# Patient Record
Sex: Female | Born: 1975
Health system: Southern US, Community
[De-identification: ages and names within clinical notes are randomized; demographics above are authoritative.]

## PROBLEM LIST (undated history)

## (undated) DIAGNOSIS — I10 Essential (primary) hypertension: Secondary | ICD-10-CM

## (undated) DIAGNOSIS — E669 Obesity, unspecified: Secondary | ICD-10-CM

## (undated) DIAGNOSIS — E785 Hyperlipidemia, unspecified: Secondary | ICD-10-CM

## (undated) DIAGNOSIS — E119 Type 2 diabetes mellitus without complications: Secondary | ICD-10-CM

## (undated) DIAGNOSIS — F419 Anxiety disorder, unspecified: Secondary | ICD-10-CM

## (undated) DIAGNOSIS — F32A Depression, unspecified: Secondary | ICD-10-CM

## (undated) DIAGNOSIS — M545 Low back pain, unspecified: Secondary | ICD-10-CM

## (undated) DIAGNOSIS — F329 Major depressive disorder, single episode, unspecified: Secondary | ICD-10-CM

## (undated) HISTORY — DX: Obesity, unspecified: E66.9

## (undated) HISTORY — DX: Anxiety disorder, unspecified: F41.9

## (undated) HISTORY — DX: Low back pain, unspecified: M54.50

## (undated) HISTORY — PX: BLADDER SUSPENSION: SHX72

## (undated) HISTORY — DX: Depression, unspecified: F32.A

## (undated) HISTORY — DX: Essential (primary) hypertension: I10

## (undated) HISTORY — PX: SPINE SURGERY: SHX786

## (undated) HISTORY — DX: Hyperlipidemia, unspecified: E78.5

---
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
## 1998-08-11 HISTORY — PX: TONSILLECTOMY: SUR1361

## 2014-09-26 ENCOUNTER — Encounter: Payer: 59 | Attending: Family | Admitting: Dietician

## 2014-09-26 ENCOUNTER — Encounter: Payer: Self-pay | Admitting: Dietician

## 2014-09-26 VITALS — Ht 66.0 in | Wt 220.0 lb

## 2014-09-26 DIAGNOSIS — E780 Pure hypercholesterolemia: Secondary | ICD-10-CM | POA: Diagnosis not present

## 2014-09-26 DIAGNOSIS — E669 Obesity, unspecified: Secondary | ICD-10-CM

## 2014-09-26 DIAGNOSIS — Z713 Dietary counseling and surveillance: Secondary | ICD-10-CM | POA: Diagnosis present

## 2014-09-26 NOTE — Progress Notes (Signed)
  Medical Nutrition Therapy:  Appt start time: 2023 end time:  3435.   Assessment:  Primary concerns today: Patient wishes to lose weight and high cholesterol noted.  Patient wishes to improve overall health. States that she eats more with boredom, sleepiness, or stress and is unable to stop snacking on candy once starts.  She has joined [employer] and enjoys this program for the support that it provides.  Lowest weight was 145 lbs 12 years ago prior to children.  Patient's weight today was 220 lbs decreased from 228 lbs 3 weeks ago.    Patient lives with husband and 3 children ages 72 yo, 67 yo and 58 mo old.  Husband is medically retired from the [employer] and snacks frequently which temps patient.  Husband is now doing the cooking most of the time.  Both shop.  Eat together at the table usually.    January Labs reviewed:  Lipids elevated  Cholesterol 220  HDL:  44  LDL:  134 [place] results:  45.5% fat BMI 35.6 but patient is large  Boned and quite muscular.  Preferred Learning Style:   Auditory  Visual  Hands on  No preference indicated   Learning Readiness:   Ready  Change in progress   MEDICATIONS: see list   DIETARY INTAKE: Fast food intake has improved since moving here from [place] in October.  There is not significant fast food by her house. 24-hr recall:  B ( AM): scrambled eggs with spinach and tomatoes or bagel thin with peanut butter.    Snk ( AM):  L ( PM): rotisserie chicken and veges or subway or fast food but tries to avoid Snk ( PM):  candy or chips, cheese and crackers D ( PM): crock pot meals, meat, starch, vege or pasta or burgers or tacos Snk ( PM):  Beverages: Coffee with FF vanilla creamer, water, diet coke occasionally  Usual physical activity: none currently.  Problems with lower back pain.  Enjoys zumba and yoga but currently no time.  Estimated energy needs: 1800 calories 200 g carbohydrates 135 g protein 50 g fat  Progress Towards  Goal(s):  In progress.   Nutritional Diagnosis:  NB-1.1 Food and nutrition-related knowledge deficit As related to weight loss and forming healthier habits.  As evidenced by patient report.    Intervention:  Nutrition Counseling.  Began discussing healthy snacks and basic meal planing ideas.  Discussed importance of lifelong healthy eating habits.  Think about joining the [facility] or try gym at [employer] or classes offered through the hospital..  Walk as able.   Continue with [employer].  Support is great! Meal plan for grocery shopping and meals. Think about healthy snacks for work. Be mindful of portions. Be mindful of hunger cues.  Teaching Method Utilized:  Visual Auditory  Handouts given during visit include:  My plate  Snack list  Meal card  Barriers to learning/adherence to lifestyle change: busy schedule with young children and job  Demonstrated degree of understanding via:  Teach Back   Monitoring/Evaluation:  Dietary intake, exercise,  and body weight in 1 month(s).

## 2014-09-26 NOTE — Patient Instructions (Signed)
Think about joining the [facility] or try gym at [facility] or classes offered through the hospital..  Walk as able.   Continue with [employer].  Support is great! Meal plan for grocery shopping and meals. Think about healthy snacks for work. Be mindful of portions. Be mindful of hunger cues.

## 2014-10-31 ENCOUNTER — Ambulatory Visit: Payer: 59 | Admitting: Dietician

## 2015-08-15 ENCOUNTER — Other Ambulatory Visit: Payer: Self-pay | Admitting: Obstetrics & Gynecology

## 2015-08-15 ENCOUNTER — Other Ambulatory Visit (HOSPITAL_COMMUNITY)
Admission: RE | Admit: 2015-08-15 | Discharge: 2015-08-15 | Disposition: A | Discharge status: Home / Self Care | Payer: 59 | Source: Ambulatory Visit | Attending: Obstetrics & Gynecology | Admitting: Obstetrics & Gynecology

## 2015-08-15 DIAGNOSIS — Z01419 Encounter for gynecological examination (general) (routine) without abnormal findings: Secondary | ICD-10-CM | POA: Insufficient documentation

## 2015-08-15 DIAGNOSIS — R635 Abnormal weight gain: Secondary | ICD-10-CM | POA: Diagnosis not present

## 2015-08-15 DIAGNOSIS — Z1151 Encounter for screening for human papillomavirus (HPV): Secondary | ICD-10-CM | POA: Insufficient documentation

## 2015-08-15 DIAGNOSIS — F3281 Premenstrual dysphoric disorder: Secondary | ICD-10-CM | POA: Diagnosis not present

## 2015-08-15 DIAGNOSIS — Z30432 Encounter for removal of intrauterine contraceptive device: Secondary | ICD-10-CM | POA: Diagnosis not present

## 2015-08-15 DIAGNOSIS — Z309 Encounter for contraceptive management, unspecified: Secondary | ICD-10-CM | POA: Diagnosis not present

## 2015-08-15 DIAGNOSIS — Z01411 Encounter for gynecological examination (general) (routine) with abnormal findings: Secondary | ICD-10-CM | POA: Diagnosis not present

## 2015-08-15 MED FILL — NUVARING VAGINAL RING: 0.12-0.015 | 84 days supply | Qty: 3 | Fill #0

## 2015-08-20 LAB — CYTOLOGY - PAP

## 2015-10-01 ENCOUNTER — Telehealth: Payer: 59 | Admitting: Family

## 2015-10-01 DIAGNOSIS — R05 Cough: Secondary | ICD-10-CM | POA: Diagnosis not present

## 2015-10-01 DIAGNOSIS — J069 Acute upper respiratory infection, unspecified: Secondary | ICD-10-CM

## 2015-10-01 DIAGNOSIS — R059 Cough, unspecified: Secondary | ICD-10-CM

## 2015-10-01 NOTE — Progress Notes (Signed)
We are sorry that you are not feeling well.  Here is how we plan to help!  Based on what you have shared with me it looks like you have upper respiratory tract inflammation that has resulted in a significant cough.  Inflammation and infection in the upper respiratory tract is commonly called bronchitis and has four common causes:  Allergies, Viral Infections, Acid Reflux and Bacterial Infections.  Allergies, viruses and acid reflux are treated by controlling symptoms or eliminating the cause. An example might be a cough caused by taking certain blood pressure medications. You stop the cough by changing the medication. Another example might be a cough caused by acid reflux. Controlling the reflux helps control the cough.  Based on your presentation I believe you most likely have A cough due to allergies.  I recommend that you start the an over-the counter-allergy medication such as Claritin 10 mg or Zyrtec 10 mg daily.    In addition you may use A non-prescription cough medication called Mucinex DM: take 2 tablets every 12 hours.    HOME CARE . Only take medications as instructed by your medical team. . Complete the entire course of an antibiotic. . Drink plenty of fluids and get plenty of rest. . Avoid close contacts especially the very young and the elderly . Cover your mouth if you cough or cough into your sleeve. . Always remember to wash your hands . A steam or ultrasonic humidifier can help congestion.    GET HELP RIGHT AWAY IF: . You develop worsening fever. . You become short of breath . You cough up blood. . Your symptoms persist after you have completed your treatment plan MAKE SURE YOU   Understand these instructions.  Will watch your condition.  Will get help right away if you are not doing well or get worse.  Your e-visit answers were reviewed by a board certified advanced clinical practitioner to complete your personal care plan.  Depending on the condition, your plan  could have included both over the counter or prescription medications. If there is a problem please reply  once you have received a response from your provider. Your safety is important to [place].  If you have drug allergies check your prescription carefully.    You can use MyChart to ask questions about today's visit, request a non-urgent call back, or ask for a work or school excuse for 24 hours related to this e-Visit. If it has been greater than 24 hours you will need to follow up with your provider, or enter a new e-Visit to address those concerns. You will get an e-mail in the next two days asking about your experience.  I hope that your e-visit has been valuable and will speed your recovery. Thank you for using e-visits.

## 2015-10-02 ENCOUNTER — Telehealth: Payer: 59 | Admitting: Family

## 2015-10-02 DIAGNOSIS — R059 Cough, unspecified: Secondary | ICD-10-CM

## 2015-10-02 DIAGNOSIS — R05 Cough: Secondary | ICD-10-CM

## 2015-10-02 DIAGNOSIS — J069 Acute upper respiratory infection, unspecified: Secondary | ICD-10-CM | POA: Diagnosis not present

## 2015-10-02 NOTE — Progress Notes (Signed)
Based on what you shared with me it looks like you have a serious condition that should be evaluated in a face to face office visit.  If you are having a true medical emergency please call 911.  If you need an urgent face to face visit, [facility] has four urgent care centers for your convenience.  . [facility]  [phone] Get Driving Directions Find a Provider at this Location  [address] [place], [place] [address] . 8 am to 8 pm Monday-Friday . 9 am to 7 pm Saturday-Sunday  .  Urgent [facility]  [phone] Get Driving Directions Find a Provider at this Location  [address], [address] [place], [place] [address] . 8 am to 8 pm Monday-Friday . 9 am to 6 pm Saturday . 11 am to 6 pm Sunday   .  [facility] [place]  [phone] Get Driving Directions  [address] [place], [place] [address] . 8 am to 8 pm Monday-Friday . 9 am to 4 pm Saturday-Sunday   . [facility] (a walk in primary care provider)  [phone]  Get Driving Directions Find a Provider at this Location  [address] [place], [place] [address] . 8 am to 8:30 pm Monday-Thursday . 8 am to 6 pm Friday . 8 am to 4 pm Saturday-Sunday   Your e-visit answers were reviewed by a board certified advanced clinical practitioner to complete your personal care plan.  Thank you for using e-Visits. 

## 2015-11-05 ENCOUNTER — Telehealth: Payer: 59 | Admitting: Nurse Practitioner

## 2015-11-05 DIAGNOSIS — M546 Pain in thoracic spine: Secondary | ICD-10-CM | POA: Diagnosis not present

## 2015-11-05 MED ORDER — TIZANIDINE HCL 4 MG PO TABS: 4.0000 mg | ORAL_TABLET | Freq: Four times a day (QID) | ORAL | Status: DC | PRN

## 2015-11-05 MED ORDER — NAPROXEN 500 MG PO TABS: 500.0000 mg | ORAL_TABLET | Freq: Two times a day (BID) | ORAL | Status: DC

## 2015-11-05 MED FILL — tiZANidine HCL 4 MG TABS: 4 | 8 days supply | Qty: 30 | Fill #0

## 2015-11-05 MED FILL — NAPROXEN 500 MG TABLET: 500 | 30 days supply | Qty: 60 | Fill #0

## 2015-11-05 NOTE — Progress Notes (Signed)

## 2015-11-20 DIAGNOSIS — Z131 Encounter for screening for diabetes mellitus: Secondary | ICD-10-CM | POA: Diagnosis not present

## 2015-11-20 DIAGNOSIS — Z0000 Encounter for general adult medical examination without abnormal findings: Secondary | ICD-10-CM | POA: Diagnosis not present

## 2015-11-20 DIAGNOSIS — E669 Obesity, unspecified: Secondary | ICD-10-CM | POA: Diagnosis not present

## 2015-11-20 DIAGNOSIS — E785 Hyperlipidemia, unspecified: Secondary | ICD-10-CM | POA: Diagnosis not present

## 2015-11-20 MED FILL — PHENTERMINE 37.5 MG TABLET: 37.5 | 30 days supply | Qty: 30 | Fill #0

## 2015-12-07 MED FILL — NUVARING VAGINAL RING: 0.12-0.015 | 84 days supply | Qty: 3 | Fill #1

## 2015-12-18 MED FILL — PHENTERMINE 37.5 MG TABLET: 37.5 | 30 days supply | Qty: 30 | Fill #1

## 2016-01-16 MED FILL — PHENTERMINE 37.5 MG TABLET: 37.5 | 30 days supply | Qty: 30 | Fill #2

## 2016-02-14 MED FILL — NUVARING VAGINAL RING: 0.12-0.015 | 84 days supply | Qty: 3 | Fill #2

## 2016-04-03 DIAGNOSIS — M549 Dorsalgia, unspecified: Secondary | ICD-10-CM | POA: Diagnosis not present

## 2016-04-03 MED FILL — traMADol HCL 50 MG TABS: 50 | 8 days supply | Qty: 30 | Fill #0

## 2016-04-30 DIAGNOSIS — M542 Cervicalgia: Secondary | ICD-10-CM | POA: Diagnosis not present

## 2016-05-06 DIAGNOSIS — M542 Cervicalgia: Secondary | ICD-10-CM | POA: Diagnosis not present

## 2016-05-12 MED FILL — NUVARING VAGINAL RING: 0.12-0.015 | 84 days supply | Qty: 3 | Fill #3

## 2016-07-14 DIAGNOSIS — F418 Other specified anxiety disorders: Secondary | ICD-10-CM | POA: Diagnosis not present

## 2016-07-14 MED FILL — FLUoxetine HCL 20 MG CAPS: 20 | 30 days supply | Qty: 30 | Fill #0

## 2016-07-17 MED FILL — ALPRAZolam 0.5 MG TABS: 0.5 | 15 days supply | Qty: 30 | Fill #0

## 2016-07-21 ENCOUNTER — Encounter (HOSPITAL_COMMUNITY): Payer: Self-pay

## 2016-07-21 ENCOUNTER — Emergency Department (HOSPITAL_COMMUNITY): Payer: 59

## 2016-07-21 ENCOUNTER — Inpatient Hospital Stay (HOSPITAL_COMMUNITY)
Admission: EM | Admit: 2016-07-21 | Discharge: 2016-07-25 | DRG: 392 | Disposition: A | Discharge status: Home / Self Care | Payer: 59 | Attending: Family Medicine | Admitting: Family Medicine

## 2016-07-21 DIAGNOSIS — E876 Hypokalemia: Secondary | ICD-10-CM | POA: Diagnosis not present

## 2016-07-21 DIAGNOSIS — E785 Hyperlipidemia, unspecified: Secondary | ICD-10-CM | POA: Diagnosis present

## 2016-07-21 DIAGNOSIS — K5732 Diverticulitis of large intestine without perforation or abscess without bleeding: Secondary | ICD-10-CM | POA: Diagnosis present

## 2016-07-21 DIAGNOSIS — K5720 Diverticulitis of large intestine with perforation and abscess without bleeding: Secondary | ICD-10-CM | POA: Diagnosis not present

## 2016-07-21 DIAGNOSIS — R1032 Left lower quadrant pain: Secondary | ICD-10-CM | POA: Diagnosis not present

## 2016-07-21 DIAGNOSIS — D72829 Elevated white blood cell count, unspecified: Secondary | ICD-10-CM | POA: Diagnosis not present

## 2016-07-21 DIAGNOSIS — M5136 Other intervertebral disc degeneration, lumbar region: Secondary | ICD-10-CM | POA: Diagnosis present

## 2016-07-21 DIAGNOSIS — Z6834 Body mass index (BMI) 34.0-34.9, adult: Secondary | ICD-10-CM | POA: Diagnosis not present

## 2016-07-21 DIAGNOSIS — E669 Obesity, unspecified: Secondary | ICD-10-CM | POA: Diagnosis present

## 2016-07-21 DIAGNOSIS — Z79899 Other long term (current) drug therapy: Secondary | ICD-10-CM | POA: Diagnosis not present

## 2016-07-21 DIAGNOSIS — Z8249 Family history of ischemic heart disease and other diseases of the circulatory system: Secondary | ICD-10-CM

## 2016-07-21 DIAGNOSIS — Z975 Presence of (intrauterine) contraceptive device: Secondary | ICD-10-CM

## 2016-07-21 DIAGNOSIS — R339 Retention of urine, unspecified: Secondary | ICD-10-CM | POA: Diagnosis not present

## 2016-07-21 DIAGNOSIS — R140 Abdominal distension (gaseous): Secondary | ICD-10-CM | POA: Diagnosis not present

## 2016-07-21 LAB — COMPREHENSIVE METABOLIC PANEL
ALK PHOS: 53 U/L (ref 38–126)
ALT: 14 U/L (ref 14–54)
ANION GAP: 9 (ref 5–15)
AST: 20 U/L (ref 15–41)
Albumin: 3.7 g/dL (ref 3.5–5.0)
BILIRUBIN TOTAL: 1.7 mg/dL — AB (ref 0.3–1.2)
BUN: 10 mg/dL (ref 6–20)
CO2: 20 mmol/L — ABNORMAL LOW (ref 22–32)
Calcium: 8.9 mg/dL (ref 8.9–10.3)
Chloride: 107 mmol/L (ref 101–111)
Creatinine, Ser: 0.83 mg/dL (ref 0.44–1.00)
GFR calc non Af Amer: >60 mL/min (ref >60)
Glucose, Bld: 94 mg/dL (ref 65–99)
Potassium: 3.5 mmol/L (ref 3.5–5.1)
Sodium: 136 mmol/L (ref 135–145)
TOTAL PROTEIN: 6.9 g/dL (ref 6.5–8.1)

## 2016-07-21 LAB — CBC
HCT: 38.8 % (ref 36.0–46.0)
HEMOGLOBIN: 13.7 g/dL (ref 12.0–15.0)
MCH: 31.4 pg (ref 26.0–34.0)
MCHC: 35.3 g/dL (ref 30.0–36.0)
MCV: 88.8 fL (ref 78.0–100.0)
Platelets: 162 10*3/uL (ref 150–400)
RBC: 4.37 MIL/uL (ref 3.87–5.11)
RDW: 12.3 % (ref 11.5–15.5)
WBC: 15.2 10*3/uL — AB (ref 4.0–10.5)

## 2016-07-21 LAB — URINALYSIS, ROUTINE W REFLEX MICROSCOPIC
BILIRUBIN URINE: NEGATIVE
GLUCOSE, UA: NEGATIVE mg/dL
KETONES UR: 15 mg/dL — AB
Nitrite: NEGATIVE
PROTEIN: NEGATIVE mg/dL
Specific Gravity, Urine: 1.025 (ref 1.005–1.030)
pH: 5.5 (ref 5.0–8.0)

## 2016-07-21 LAB — URINALYSIS, MICROSCOPIC (REFLEX)

## 2016-07-21 LAB — LIPASE, BLOOD: Lipase: 19 U/L (ref 11–51)

## 2016-07-21 LAB — PREGNANCY, URINE: Preg Test, Ur: NEGATIVE

## 2016-07-21 IMAGING — CT CT ABD-PELV W/ CM
2 of 4 series · 9 of 46 positions shown, 10 images · IV contrast (Iodine)
Comparison: None.

CLINICAL DATA: Lower abdominal pain radiating to lower back since
this morning. Associated urinary retention.

EXAM:
CT ABDOMEN AND PELVIS WITH CONTRAST
TECHNIQUE: Multidetector CT imaging of the abdomen and pelvis was performed
using the standard protocol following bolus administration of
intravenous contrast.
CONTRAST:  100mL [FS] IOPAMIDOL ([FS]) INJECTION 61%

[Series 201: routine, idose (2) · axial · 0.83mm/px · z∈[+340,+755]mm · 6 of 101 slices shown, 7 images]
[im 9/101  soft-tissue]
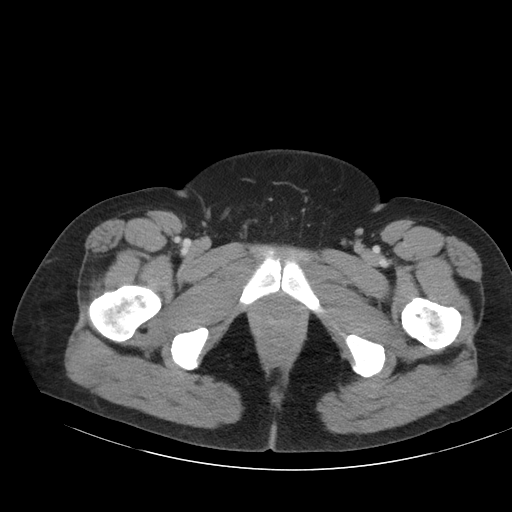
[im 9/101  bone]
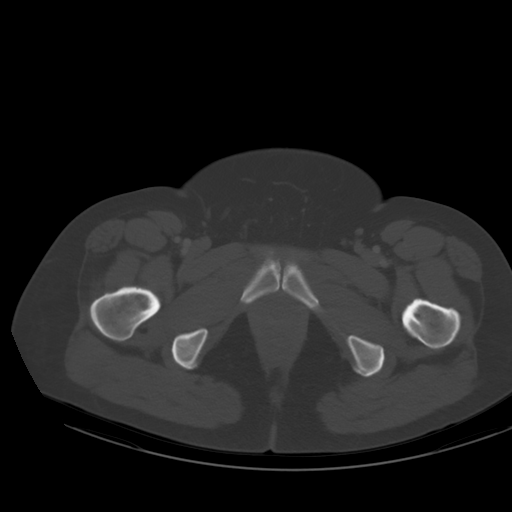
[im 27/101  soft-tissue]
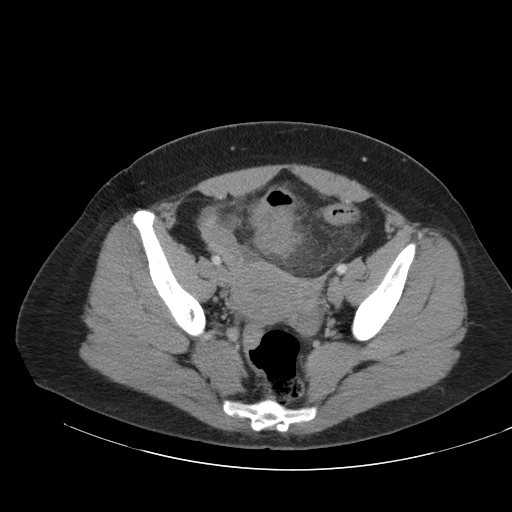
[im 44/101  soft-tissue]
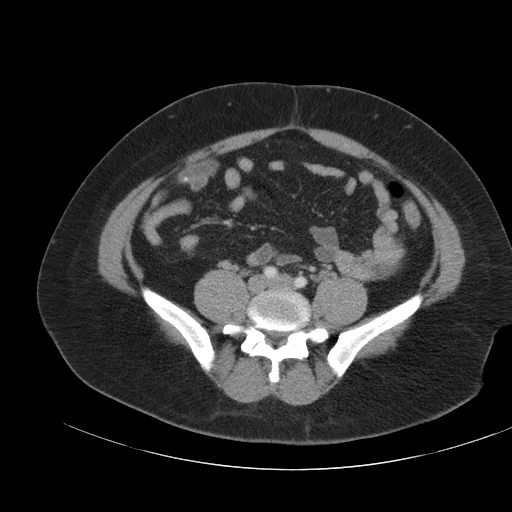
[im 57/101  soft-tissue]
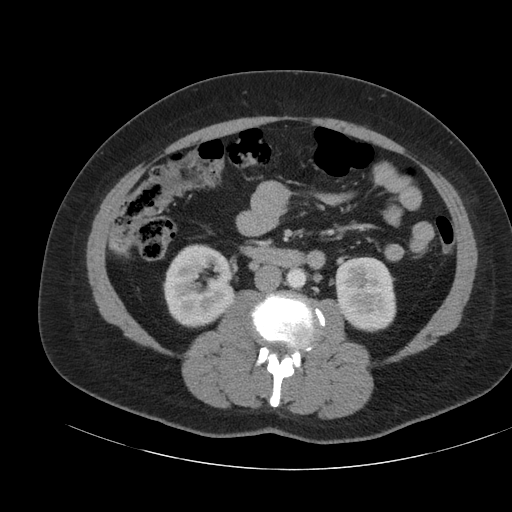
[im 74/101  soft-tissue]
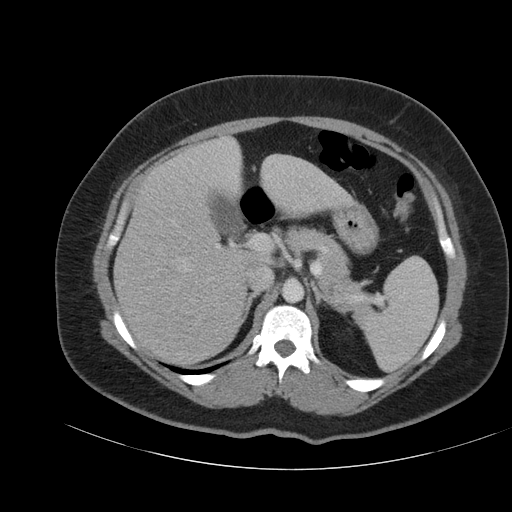
[im 92/101  soft-tissue]
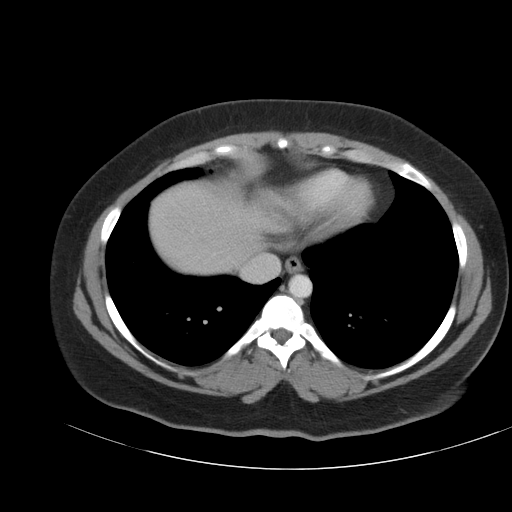

[Series 203: coronals, idose (2) · coronal · 0.45mm/px · 3 of 135 slices shown]
[im 45/135  soft-tissue]
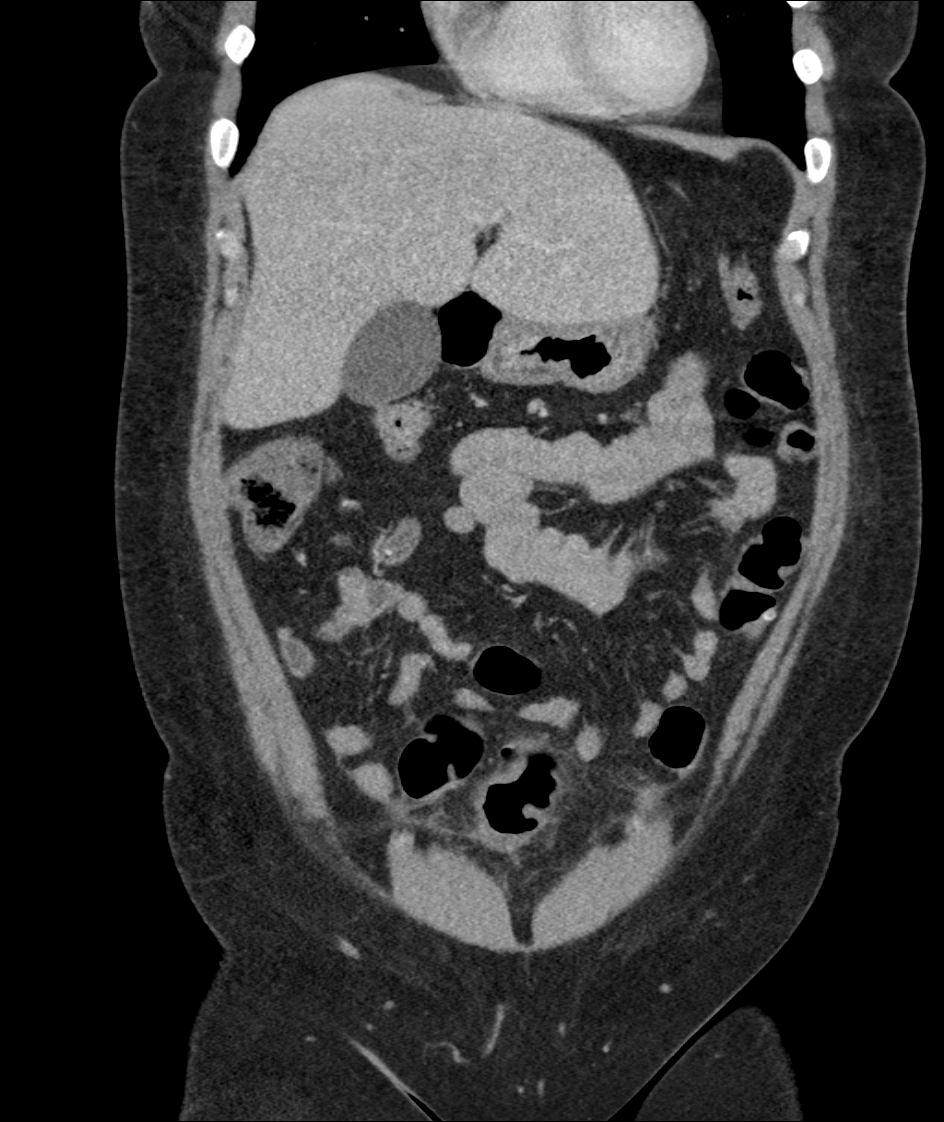
[im 60/135  soft-tissue]
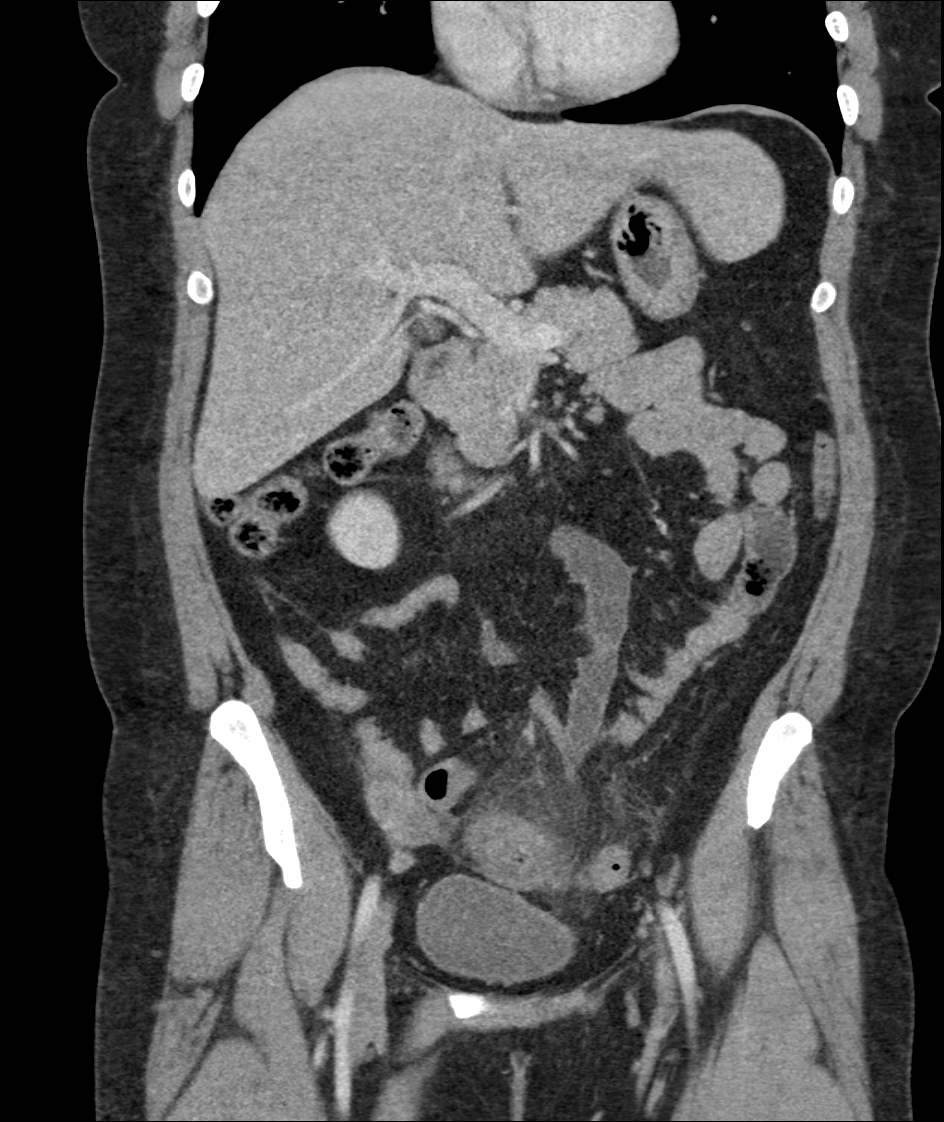
[im 75/135  soft-tissue]
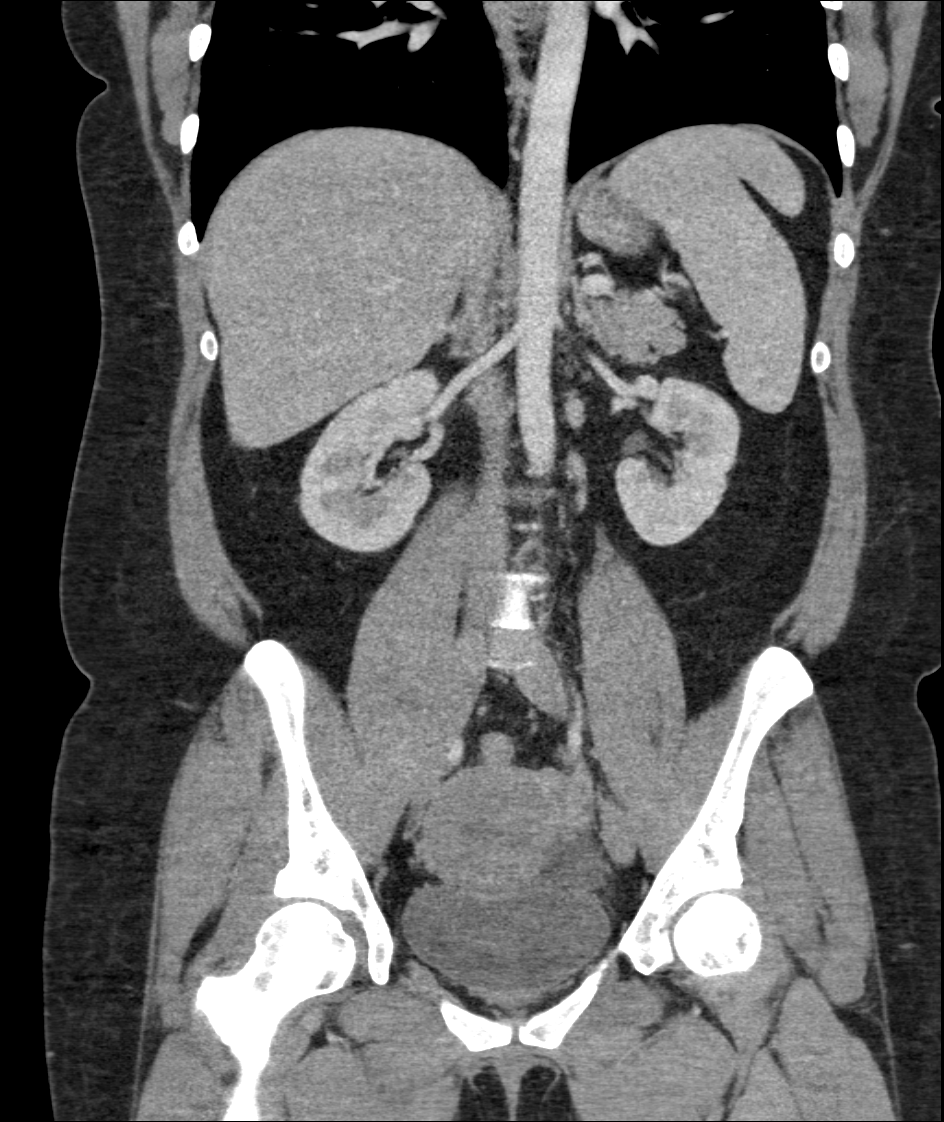

[9 of 46 positions shown; findings below may reference images not displayed]

FINDINGS: LOWER CHEST: Lung bases are clear. Included heart size is normal. No
pericardial effusion.

HEPATOBILIARY: Liver and gallbladder are normal.

PANCREAS: Normal.

SPLEEN: Normal.

ADRENALS/URINARY TRACT: Kidneys are orthotopic, demonstrating
symmetric enhancement. No nephrolithiasis, hydronephrosis or solid
renal masses. The unopacified ureters are normal in course and
caliber. Urinary bladder is partially distended and unremarkable.
Normal adrenal glands.

STOMACH/BOWEL: The stomach, small and large bowel are normal in
course. Moderate degree of pericolonic inflammation without abscess
is noted along the distal sigmoid colon consistent with
diverticulitis with microperforation given at least 4 dots of free
air noted, best seen on coronal image 62. Moderate degree of colonic
thickening from inflammation is seen. No bowel obstruction is noted.
Small amount of free fluid in the pelvis. Normal appendix.

VASCULAR/LYMPHATIC: Aortoiliac vessels are normal in course and
caliber. No lymphadenopathy by CT size criteria.

REPRODUCTIVE: Normal.  Diaphragm is noted in place for bold

OTHER: No intraperitoneal free fluid or free air.

MUSCULOSKELETAL: Nonacute. Limbus vertebra at L2. Degenerative disc
disease at L2-3.
IMPRESSION: Acute sigmoid diverticulitis with tiny foci of free intraperitoneal
air and moderate pericolonic inflammation. No bowel obstruction or
abscess. Moderate transmural thickening of the sigmoid in the
affected area. Small amount of free fluid is noted in the pelvis.

## 2016-07-21 MED ORDER — HYDROMORPHONE HCL 2 MG/ML IJ SOLN
0.5000 mg | Freq: Once | INTRAMUSCULAR | Status: AC
Start: 2016-07-21 — End: 2016-07-21
  Administered 2016-07-21: 0.5 mg via INTRAVENOUS
  Filled 2016-07-21: qty 1

## 2016-07-21 MED ORDER — OXYCODONE-ACETAMINOPHEN 5-325 MG PO TABS
ORAL_TABLET | ORAL | Status: AC
  Administered 2016-07-21: 1 via ORAL
  Filled 2016-07-21: qty 1

## 2016-07-21 MED ORDER — SODIUM CHLORIDE 0.9 % IV SOLN
INTRAVENOUS | Status: DC
  Administered 2016-07-21: 22:00:00 via INTRAVENOUS

## 2016-07-21 MED ORDER — ONDANSETRON HCL 4 MG/2ML IJ SOLN
4.0000 mg | Freq: Once | INTRAMUSCULAR | Status: AC
  Administered 2016-07-21: 4 mg via INTRAVENOUS
  Filled 2016-07-21: qty 2

## 2016-07-21 MED ORDER — IOPAMIDOL (ISOVUE-300) INJECTION 61%
INTRAVENOUS | Status: AC
  Administered 2016-07-21: 100 mL
  Filled 2016-07-21: qty 100

## 2016-07-21 MED ORDER — OXYCODONE-ACETAMINOPHEN 5-325 MG PO TABS
1.0000 | ORAL_TABLET | ORAL | Status: AC | PRN
  Administered 2016-07-21 (×2): 1 via ORAL

## 2016-07-21 NOTE — ED Triage Notes (Signed)
Per Pt, Pt is coming from home with complaints of lower abdominal pain that started this morning. Pt reports that she started to have cramping this morning with decreased urination. Starting at 1400, pt has been unable to urinate. About an hour ago, pt started to have lower back pain that has increased. Denies any N/V/D. Reports no relief with gasX or medication. Last bowel movement at 1600 with no new relief.

## 2016-07-21 NOTE — ED Provider Notes (Signed)
[facility] DEPT Provider Note   CSN: [id number] Arrival date & time: 07/21/16  1759 By signing my name below, I, Georgette Shell, attest that this documentation has been prepared under the direction and in the presence of [facility], [place]. Electronically Signed: Georgette Shell, ED Scribe. 07/21/16. 9:40 PM.  History   Chief Complaint Chief Complaint  Patient presents with  . Abdominal Pain  . Urinary Retention   HPI Comments: Crystal Madden is a 40 y.o. female with no pertinent PMHx, who presents to the Emergency Department complaining of gradually worsening, cramping, lower abdominal pain intermittently radiating to her lower back onset this morning. The pain is worse in the LLQ.  Pt reports she also has associated urinary retention that began around 2 pm. Her pain is exacerbated with bending forward. She has taken Gas X with no relief. Pt reports normal bowel movement. She denies h/o similar symptoms. Pt further denies fever, diarrhea, constipation, nausea, vomiting, vaginal discharge, or any other associated symptoms. She is married and has no concern for STI's.  The history is provided by the patient. No language interpreter was used.    Past Medical History:  Diagnosis Date  . Hyperlipidemia   . Obesity (BMI 30-39.9)     Patient Active Problem List   Diagnosis Date Noted  . Diverticulitis of sigmoid colon 07/22/2016    Past Surgical History:  Procedure Laterality Date  . BLADDER SUSPENSION    . CESAREAN SECTION      OB History    No data available       Home Medications    Prior to Admission medications   Medication Sig Start Date End Date Taking? Authorizing Provider  ALPRAZolam Duanne Moron) 0.5 MG tablet Take 0.25-0.5 mg by mouth 2 (two) times daily as needed. 07/17/16  Yes Historical Provider, MD  etonogestrel-ethinyl estradiol (NUVARING) 0.12-0.015 MG/24HR vaginal ring Place 1 each vaginally every 28 (twenty-eight) days. Insert vaginally and leave in place for 3  consecutive weeks, then remove for 1 week.   Yes Historical Provider, MD  FLUoxetine (PROZAC) 20 MG capsule Take 20 mg by mouth daily. 07/14/16  Yes Historical Provider, MD  ibuprofen (ADVIL,MOTRIN) 100 MG tablet Take 600 mg by mouth every 6 (six) hours as needed for fever.    Yes Historical Provider, MD  simethicone (MYLICON) 80 MG chewable tablet Chew 80 mg by mouth every 6 (six) hours as needed for flatulence.   Yes Historical Provider, MD  levonorgestrel (MIRENA) 20 MCG/24HR IUD 1 each by Intrauterine route once.    Historical Provider, MD  naproxen (NAPROSYN) 500 MG tablet Take 1 tablet (500 mg total) by mouth 2 (two) times daily with a meal. Patient not taking: Reported on 07/21/2016 11/05/15   Mary-Margaret Hassell Done, FNP  tiZANidine (ZANAFLEX) 4 MG tablet Take 1 tablet (4 mg total) by mouth every 6 (six) hours as needed for muscle spasms. Patient not taking: Reported on 07/21/2016 11/05/15   Mary-Margaret Hassell Done, FNP    Family History No family history on file.  Social History Social History  Substance Use Topics  . Smoking status: Never Smoker  . Smokeless tobacco: Never Used  . Alcohol use Yes     Allergies   Patient has no known allergies.   Review of Systems Review of Systems  Constitutional: Negative for chills and fever.  Gastrointestinal: Positive for abdominal pain. Negative for constipation, diarrhea, nausea and vomiting.  Genitourinary: Positive for decreased urine volume, difficulty urinating and flank pain. Negative for vaginal discharge.  Musculoskeletal:  Positive for back pain.  All other systems reviewed and are negative.    Physical Exam Updated Vital Signs BP 122/65 (BP Location: Left Arm)   Pulse 98   Temp 98.6 F (37 C) (Oral)   Resp 16   Ht 5\' 6"  (1.676 m)   Wt 97.5 kg   LMP 06/30/2016 (Approximate)   SpO2 98%   BMI 34.69 kg/m   Physical Exam  Constitutional: She appears well-developed and well-nourished.  HENT:  Head: Normocephalic.  Eyes:  Conjunctivae are normal.  Cardiovascular: Normal rate.   Pulmonary/Chest: Effort normal. No respiratory distress.  Abdominal: Soft. Bowel sounds are normal. There is tenderness in the left lower quadrant. There is guarding. There is no rebound and no CVA tenderness.  Lower abdominal tenderness that is increased in the LLQ.  Musculoskeletal: Normal range of motion.  Neurological: She is alert.  Skin: Skin is warm and dry.  Psychiatric: She has a normal mood and affect. Her behavior is normal.  Nursing note and vitals reviewed.    ED Treatments / Results  DIAGNOSTIC STUDIES: Oxygen Saturation is 100% on RA, normal by my interpretation.    COORDINATION OF CARE: 9:39 PM Discussed treatment plan with pt at bedside which includes abdomen CT and lab work and pt agreed to plan.  Labs (all labs ordered are listed, but only abnormal results are displayed) Labs Reviewed  URINALYSIS, ROUTINE W REFLEX MICROSCOPIC - Abnormal; Notable for the following:       Result Value   Hgb urine dipstick TRACE (*)    Ketones, ur 15 (*)    Leukocytes, UA TRACE (*)    All other components within normal limits  COMPREHENSIVE METABOLIC PANEL - Abnormal; Notable for the following:    CO2 20 (*)    Total Bilirubin 1.7 (*)    All other components within normal limits  CBC - Abnormal; Notable for the following:    WBC 15.2 (*)    All other components within normal limits  URINALYSIS, MICROSCOPIC (REFLEX) - Abnormal; Notable for the following:    Bacteria, UA FEW (*)    Squamous Epithelial / LPF 6-30 (*)    All other components within normal limits  LIPASE, BLOOD  PREGNANCY, URINE    Radiology Ct Abdomen Pelvis W Contrast  Result Date: 07/22/2016 CLINICAL DATA:  Lower abdominal pain radiating to lower back since this morning. Associated urinary retention. EXAM: CT ABDOMEN AND PELVIS WITH CONTRAST TECHNIQUE: Multidetector CT imaging of the abdomen and pelvis was performed using the standard protocol  following bolus administration of intravenous contrast. CONTRAST:  158mL ISOVUE-300 IOPAMIDOL (ISOVUE-300) INJECTION 61% COMPARISON:  None. FINDINGS: LOWER CHEST: Lung bases are clear. Included heart size is normal. No pericardial effusion. HEPATOBILIARY: Liver and gallbladder are normal. PANCREAS: Normal. SPLEEN: Normal. ADRENALS/URINARY TRACT: Kidneys are orthotopic, demonstrating symmetric enhancement. No nephrolithiasis, hydronephrosis or solid renal masses. The unopacified ureters are normal in course and caliber. Urinary bladder is partially distended and unremarkable. Normal adrenal glands. STOMACH/BOWEL: The stomach, small and large bowel are normal in course. Moderate degree of pericolonic inflammation without abscess is noted along the distal sigmoid colon consistent with diverticulitis with microperforation given at least 4 dots of free air noted, best seen on coronal image 62. Moderate degree of colonic thickening from inflammation is seen. No bowel obstruction is noted. Small amount of free fluid in the pelvis. Normal appendix. VASCULAR/LYMPHATIC: Aortoiliac vessels are normal in course and caliber. No lymphadenopathy by CT size criteria. REPRODUCTIVE: Normal.  Diaphragm is  noted in place for bold OTHER: No intraperitoneal free fluid or free air. MUSCULOSKELETAL: Nonacute. Limbus vertebra at L2. Degenerative disc disease at L2-3. IMPRESSION: Acute sigmoid diverticulitis with tiny foci of free intraperitoneal air and moderate pericolonic inflammation. No bowel obstruction or abscess. Moderate transmural thickening of the sigmoid in the affected area. Small amount of free fluid is noted in the pelvis. Electronically Signed   By: Ashley Royalty M.D.   On: 07/22/2016 00:25    Procedures Procedures (including critical care time)  Medications Ordered in ED Medications  0.9 %  sodium chloride infusion ( Intravenous Stopped 07/22/16 0054)  0.9 %  sodium chloride infusion (not administered)  HYDROmorphone  (DILAUDID) injection 1 mg (not administered)  ondansetron (ZOFRAN) injection 4 mg (not administered)  piperacillin-tazobactam (ZOSYN) IVPB 3.375 g (not administered)  oxyCODONE-acetaminophen (PERCOCET/ROXICET) 5-325 MG per tablet 1 tablet (1 tablet Oral Given by Other 07/21/16 1900)  ondansetron (ZOFRAN) injection 4 mg (4 mg Intravenous Given 07/21/16 2251)  HYDROmorphone (DILAUDID) injection 0.5 mg (0.5 mg Intravenous Given 07/21/16 2252)  iopamidol (ISOVUE-300) 61 % injection (100 mLs  Contrast Given 07/21/16 2343)  piperacillin-tazobactam (ZOSYN) IVPB 3.375 g (0 g Intravenous Stopped 07/22/16 0149)  HYDROmorphone (DILAUDID) injection 1 mg (1 mg Intravenous Given 07/22/16 0102)     Initial Impression / Assessment and Plan / ED Course  I have reviewed the triage vital signs and the nursing notes.  Pertinent labs & imaging results that were available during my care of the patient were reviewed by me and Dr. Christy Gentles and considered in my medical decision making (see chart for details).  Clinical Course   Labs, CT scan, pain management.  Dr. Christy Gentles in to examine the patient and discuss CT results and plan of care which includes admission, antibiotics and surgical consult. Patient agrees with plan.   Consult with [facility] and they will consult on the patient.  Dr. Kae Heller in to see the patient. Consult with admitting MD and will admit to med surg unit  CRITICAL CARE Performed by: Latonda Larrivee Total critical care time: 30 minutes Critical care time was exclusive of separately billable procedures and treating other patients. Critical care was necessary to treat or prevent imminent or life-threatening deterioration. Critical care was time spent personally by me on the following activities: development of treatment plan with patient and/or surrogate as well as nursing, discussions with consultants, evaluation of patient's response to treatment, examination of patient, obtaining history from  patient or surrogate, ordering and performing treatments and interventions, ordering and review of laboratory studies, ordering and review of radiographic studies, pulse oximetry and re-evaluation of patient's condition.  Final Clinical Impressions(s) / ED Diagnoses   Final diagnoses:  Diverticulitis of sigmoid colon    New Prescriptions New Prescriptions   No medications on file   I personally performed the services described in this documentation, which was scribed in my presence. The recorded information has been reviewed and is accurate.     [place], NP 07/22/16 [address], NP 07/26/16 [id number]    Ripley Fraise, MD 07/26/16 [phone]

## 2016-07-22 ENCOUNTER — Encounter (HOSPITAL_COMMUNITY): Payer: Self-pay | Admitting: Internal Medicine

## 2016-07-22 DIAGNOSIS — R1032 Left lower quadrant pain: Secondary | ICD-10-CM

## 2016-07-22 DIAGNOSIS — D72829 Elevated white blood cell count, unspecified: Secondary | ICD-10-CM | POA: Diagnosis not present

## 2016-07-22 DIAGNOSIS — E669 Obesity, unspecified: Secondary | ICD-10-CM | POA: Diagnosis present

## 2016-07-22 DIAGNOSIS — E876 Hypokalemia: Secondary | ICD-10-CM

## 2016-07-22 DIAGNOSIS — E785 Hyperlipidemia, unspecified: Secondary | ICD-10-CM | POA: Diagnosis present

## 2016-07-22 DIAGNOSIS — Z975 Presence of (intrauterine) contraceptive device: Secondary | ICD-10-CM | POA: Diagnosis not present

## 2016-07-22 DIAGNOSIS — Z79899 Other long term (current) drug therapy: Secondary | ICD-10-CM | POA: Diagnosis not present

## 2016-07-22 DIAGNOSIS — Z8249 Family history of ischemic heart disease and other diseases of the circulatory system: Secondary | ICD-10-CM | POA: Diagnosis not present

## 2016-07-22 DIAGNOSIS — K5732 Diverticulitis of large intestine without perforation or abscess without bleeding: Secondary | ICD-10-CM | POA: Diagnosis present

## 2016-07-22 DIAGNOSIS — M5136 Other intervertebral disc degeneration, lumbar region: Secondary | ICD-10-CM | POA: Diagnosis present

## 2016-07-22 DIAGNOSIS — R339 Retention of urine, unspecified: Secondary | ICD-10-CM | POA: Diagnosis present

## 2016-07-22 DIAGNOSIS — Z6834 Body mass index (BMI) 34.0-34.9, adult: Secondary | ICD-10-CM | POA: Diagnosis not present

## 2016-07-22 DIAGNOSIS — K5720 Diverticulitis of large intestine with perforation and abscess without bleeding: Secondary | ICD-10-CM | POA: Diagnosis not present

## 2016-07-22 LAB — CBC WITH DIFFERENTIAL/PLATELET
BASOS ABS: 0 10*3/uL (ref 0.0–0.1)
BASOS PCT: 0 %
EOS ABS: 0.1 10*3/uL (ref 0.0–0.7)
Eosinophils Relative: 0 %
HCT: 36.2 % (ref 36.0–46.0)
HEMOGLOBIN: 12.3 g/dL (ref 12.0–15.0)
Lymphocytes Relative: 15 %
Lymphs Abs: 1.7 10*3/uL (ref 0.7–4.0)
MCH: 30.4 pg (ref 26.0–34.0)
MCHC: 34 g/dL (ref 30.0–36.0)
MCV: 89.6 fL (ref 78.0–100.0)
MONOS PCT: 6 %
Monocytes Absolute: 0.7 10*3/uL (ref 0.1–1.0)
Neutro Abs: 8.9 10*3/uL — ABNORMAL HIGH (ref 1.7–7.7)
Neutrophils Relative %: 79 %
Platelets: 146 10*3/uL — ABNORMAL LOW (ref 150–400)
RBC: 4.04 MIL/uL (ref 3.87–5.11)
RDW: 12.5 % (ref 11.5–15.5)
WBC: 11.3 10*3/uL — AB (ref 4.0–10.5)

## 2016-07-22 LAB — COMPREHENSIVE METABOLIC PANEL
ALK PHOS: 44 U/L (ref 38–126)
ALT: 11 U/L — AB (ref 14–54)
AST: 14 U/L — ABNORMAL LOW (ref 15–41)
Albumin: 3 g/dL — ABNORMAL LOW (ref 3.5–5.0)
Anion gap: 8 (ref 5–15)
BILIRUBIN TOTAL: 2 mg/dL — AB (ref 0.3–1.2)
BUN: 5 mg/dL — ABNORMAL LOW (ref 6–20)
CALCIUM: 8.2 mg/dL — AB (ref 8.9–10.3)
CO2: 21 mmol/L — AB (ref 22–32)
CREATININE: 0.79 mg/dL (ref 0.44–1.00)
Chloride: 108 mmol/L (ref 101–111)
Glucose, Bld: 120 mg/dL — ABNORMAL HIGH (ref 65–99)
Potassium: 3.4 mmol/L — ABNORMAL LOW (ref 3.5–5.1)
Sodium: 137 mmol/L (ref 135–145)
TOTAL PROTEIN: 5.7 g/dL — AB (ref 6.5–8.1)

## 2016-07-22 MED ORDER — PIPERACILLIN-TAZOBACTAM 3.375 G IVPB 30 MIN
3.3750 g | Freq: Four times a day (QID) | INTRAVENOUS | Status: DC
  Filled 2016-07-22 (×2): qty 50

## 2016-07-22 MED ORDER — ONDANSETRON HCL 4 MG/2ML IJ SOLN: 4.0000 mg | Freq: Three times a day (TID) | INTRAMUSCULAR | Status: DC | PRN

## 2016-07-22 MED ORDER — ACETAMINOPHEN 325 MG PO TABS: 650.0000 mg | ORAL_TABLET | Freq: Four times a day (QID) | ORAL | Status: DC | PRN

## 2016-07-22 MED ORDER — DEXTROSE-NACL 5-0.9 % IV SOLN
INTRAVENOUS | Status: DC
  Administered 2016-07-22: 03:00:00 via INTRAVENOUS

## 2016-07-22 MED ORDER — HYDROMORPHONE HCL 2 MG/ML IJ SOLN
1.0000 mg | INTRAMUSCULAR | Status: DC | PRN
  Administered 2016-07-22 (×3): 1 mg via INTRAVENOUS
  Filled 2016-07-22 (×3): qty 1

## 2016-07-22 MED ORDER — ACETAMINOPHEN 650 MG RE SUPP: 650.0000 mg | Freq: Four times a day (QID) | RECTAL | Status: DC | PRN

## 2016-07-22 MED ORDER — SODIUM CHLORIDE 0.9 % IV SOLN: INTRAVENOUS | Status: DC

## 2016-07-22 MED ORDER — ONDANSETRON HCL 4 MG PO TABS
4.0000 mg | ORAL_TABLET | Freq: Four times a day (QID) | ORAL | Status: DC | PRN
  Administered 2016-07-23: 4 mg via ORAL
  Filled 2016-07-22: qty 1

## 2016-07-22 MED ORDER — POTASSIUM CHLORIDE 2 MEQ/ML IV SOLN
INTRAVENOUS | Status: DC
  Administered 2016-07-22: 12:00:00 via INTRAVENOUS
  Filled 2016-07-22 (×3): qty 1000

## 2016-07-22 MED ORDER — CHLORHEXIDINE GLUCONATE 0.12 % MT SOLN
15.0000 mL | Freq: Two times a day (BID) | OROMUCOSAL | Status: DC
  Administered 2016-07-22 – 2016-07-23 (×3): 15 mL via OROMUCOSAL
  Filled 2016-07-22 (×3): qty 15

## 2016-07-22 MED ORDER — HYDROMORPHONE HCL 2 MG/ML IJ SOLN
2.0000 mg | INTRAMUSCULAR | Status: DC | PRN
  Administered 2016-07-22 – 2016-07-23 (×6): 2 mg via INTRAVENOUS
  Filled 2016-07-22 (×6): qty 1

## 2016-07-22 MED ORDER — ORAL CARE MOUTH RINSE
15.0000 mL | Freq: Two times a day (BID) | OROMUCOSAL | Status: DC
  Administered 2016-07-22 – 2016-07-23 (×2): 15 mL via OROMUCOSAL

## 2016-07-22 MED ORDER — ENOXAPARIN SODIUM 40 MG/0.4ML ~~LOC~~ SOLN
40.0000 mg | SUBCUTANEOUS | Status: DC
  Administered 2016-07-22 – 2016-07-23 (×2): 40 mg via SUBCUTANEOUS
  Filled 2016-07-22 (×3): qty 0.4

## 2016-07-22 MED ORDER — KCL IN DEXTROSE-NACL 40-5-0.9 MEQ/L-%-% IV SOLN
INTRAVENOUS | Status: DC
  Administered 2016-07-23 – 2016-07-24 (×2): via INTRAVENOUS
  Filled 2016-07-22 (×3): qty 1000

## 2016-07-22 MED ORDER — HYDROMORPHONE HCL 2 MG/ML IJ SOLN
1.0000 mg | Freq: Once | INTRAMUSCULAR | Status: AC
  Administered 2016-07-22: 1 mg via INTRAVENOUS
  Filled 2016-07-22: qty 1

## 2016-07-22 MED ORDER — PIPERACILLIN-TAZOBACTAM 3.375 G IVPB 30 MIN
3.3750 g | Freq: Once | INTRAVENOUS | Status: AC
  Administered 2016-07-22: 3.375 g via INTRAVENOUS
  Filled 2016-07-22: qty 50

## 2016-07-22 MED ORDER — POTASSIUM CHLORIDE 2 MEQ/ML IV SOLN
30.0000 meq | Freq: Once | INTRAVENOUS | Status: AC
  Administered 2016-07-22: 30 meq via INTRAVENOUS
  Filled 2016-07-22: qty 15

## 2016-07-22 MED ORDER — MORPHINE SULFATE (PF) 4 MG/ML IV SOLN
2.0000 mg | INTRAVENOUS | Status: DC | PRN
Start: 2016-07-22 — End: 2016-07-22
  Administered 2016-07-22: 2 mg via INTRAVENOUS
  Filled 2016-07-22: qty 1

## 2016-07-22 MED ORDER — HYDROMORPHONE HCL 2 MG/ML IJ SOLN: 1.0000 mg | INTRAMUSCULAR | Status: DC | PRN

## 2016-07-22 MED ORDER — PIPERACILLIN-TAZOBACTAM 3.375 G IVPB
3.3750 g | Freq: Three times a day (TID) | INTRAVENOUS | Status: DC
  Administered 2016-07-22 – 2016-07-24 (×7): 3.375 g via INTRAVENOUS
  Filled 2016-07-22 (×10): qty 50

## 2016-07-22 MED ORDER — ONDANSETRON HCL 4 MG/2ML IJ SOLN: 4.0000 mg | Freq: Four times a day (QID) | INTRAMUSCULAR | Status: DC | PRN

## 2016-07-22 NOTE — Progress Notes (Signed)
Subjective: Still pretty tender LLQ, no other complaints.  Objective: Vital signs in last 24 hours: Temp:  [98.6 F (37 C)] 98.6 F (37 C) (12/12 0549) Pulse Rate:  [90-108] 90 (12/12 0549) Resp:  [16-18] 16 (12/12 0549) BP: (122-141)/(63-75) 126/68 (12/12 0549) SpO2:  [95 %-100 %] 98 % (12/12 0549) Weight:  [97.1 kg (214 lb)-97.5 kg (214 lb 14.4 oz)] 97.1 kg (214 lb) (12/12 0220) Last BM Date: 07/21/16 IV 1050 only thing in I/O Labs stable, bilirubin is up, WBC is better CT scan 07/21/16:  Moderate degree of pericolonic inflammation without abscess is noted along the distal sigmoid colon consistent with diverticulitis with microperforation given at least 4 dots of free air noted, best seen on coronal image 62. Moderate degree of colonic thickening from inflammation is seen. No bowel obstruction is noted. Small amount of free fluid in the pelvis. Normal appendix. Intake/Output from previous day: 12/11 0701 - 12/12 0700 In: 1050 [I.V.:1000; IV Piggyback:50] Out: -  Intake/Output this shift: No intake/output data recorded.  General appearance: alert, cooperative and no distress ABdominal:  Soft, tender LLQ, no distension/peritonitis   Lab Results:   Recent Labs  07/21/16 1844 07/22/16 0542  WBC 15.2* 11.3*  HGB 13.7 12.3  HCT 38.8 36.2  PLT 162 146*    BMET  Recent Labs  07/21/16 1844 07/22/16 0542  NA 136 137  K 3.5 3.4*  CL 107 108  CO2 20* 21*  GLUCOSE 94 120*  BUN 10 5*  CREATININE 0.83 0.79  CALCIUM 8.9 8.2*   PT/INR No results for input(s): LABPROT, INR in the last 72 hours.   Recent Labs Lab 07/21/16 1844 07/22/16 0542  AST 20 14*  ALT 14 11*  ALKPHOS 53 44  BILITOT 1.7* 2.0*  PROT 6.9 5.7*  ALBUMIN 3.7 3.0*     Lipase     Component Value Date/Time   LIPASE 19 07/21/2016 1844     Studies/Results: Ct Abdomen Pelvis W Contrast  Result Date: 07/22/2016 CLINICAL DATA:  Lower abdominal pain radiating to lower back since this  morning. Associated urinary retention. EXAM: CT ABDOMEN AND PELVIS WITH CONTRAST TECHNIQUE: Multidetector CT imaging of the abdomen and pelvis was performed using the standard protocol following bolus administration of intravenous contrast. CONTRAST:  183mL ISOVUE-300 IOPAMIDOL (ISOVUE-300) INJECTION 61% COMPARISON:  None. FINDINGS: LOWER CHEST: Lung bases are clear. Included heart size is normal. No pericardial effusion. HEPATOBILIARY: Liver and gallbladder are normal. PANCREAS: Normal. SPLEEN: Normal. ADRENALS/URINARY TRACT: Kidneys are orthotopic, demonstrating symmetric enhancement. No nephrolithiasis, hydronephrosis or solid renal masses. The unopacified ureters are normal in course and caliber. Urinary bladder is partially distended and unremarkable. Normal adrenal glands. STOMACH/BOWEL: The stomach, small and large bowel are normal in course. Moderate degree of pericolonic inflammation without abscess is noted along the distal sigmoid colon consistent with diverticulitis with microperforation given at least 4 dots of free air noted, best seen on coronal image 62. Moderate degree of colonic thickening from inflammation is seen. No bowel obstruction is noted. Small amount of free fluid in the pelvis. Normal appendix. VASCULAR/LYMPHATIC: Aortoiliac vessels are normal in course and caliber. No lymphadenopathy by CT size criteria. REPRODUCTIVE: Normal.  Diaphragm is noted in place for bold OTHER: No intraperitoneal free fluid or free air. MUSCULOSKELETAL: Nonacute. Limbus vertebra at L2. Degenerative disc disease at L2-3. IMPRESSION: Acute sigmoid diverticulitis with tiny foci of free intraperitoneal air and moderate pericolonic inflammation. No bowel obstruction or abscess. Moderate transmural thickening of the sigmoid in the affected  area. Small amount of free fluid is noted in the pelvis. Electronically Signed   By: Ashley Royalty M.D.   On: 07/22/2016 00:25   Prior to Admission medications   Medication Sig  Start Date End Date Taking? Authorizing Provider  ALPRAZolam Duanne Moron) 0.5 MG tablet Take 0.25-0.5 mg by mouth 2 (two) times daily as needed. 07/17/16  Yes Historical Provider, MD  etonogestrel-ethinyl estradiol (NUVARING) 0.12-0.015 MG/24HR vaginal ring Place 1 each vaginally every 28 (twenty-eight) days. Insert vaginally and leave in place for 3 consecutive weeks, then remove for 1 week.   Yes Historical Provider, MD  FLUoxetine (PROZAC) 20 MG capsule Take 20 mg by mouth daily. 07/14/16  Yes Historical Provider, MD  ibuprofen (ADVIL,MOTRIN) 100 MG tablet Take 600 mg by mouth every 6 (six) hours as needed for fever.    Yes Historical Provider, MD  simethicone (MYLICON) 80 MG chewable tablet Chew 80 mg by mouth every 6 (six) hours as needed for flatulence.   Yes Historical Provider, MD  levonorgestrel (MIRENA) 20 MCG/24HR IUD 1 each by Intrauterine route once.    Historical Provider, MD  naproxen (NAPROSYN) 500 MG tablet Take 1 tablet (500 mg total) by mouth 2 (two) times daily with a meal. Patient not taking: Reported on 07/21/2016 11/05/15   Mary-Margaret Hassell Done, FNP  tiZANidine (ZANAFLEX) 4 MG tablet Take 1 tablet (4 mg total) by mouth every 6 (six) hours as needed for muscle spasms. Patient not taking: Reported on 07/21/2016 11/05/15   Mary-Margaret Hassell Done, FNP    Medications: . mouth rinse  15 mL Mouth Rinse BID  . piperacillin-tazobactam (ZOSYN)  IV  3.375 g Intravenous Q8H   . dextrose 5 % and 0.9% NaCl 100 mL/hr at 07/22/16 [id number]    Assessment/Plan Acute sigmoid diverticulitis with microperforation Degenerative disc disease L2-3 FEN;  IV fluids/NPO. ID: Zosyn day 2 DVT: SCD - adding Lovenox but will watch platelets  Plan:  NPO except ice chips for now.  Hope to advance diet as her sx improve.        LOS: 0 days    Abhiram Criado 07/22/2016 [phone]

## 2016-07-22 NOTE — ED Provider Notes (Signed)
Patient seen/examined in the Emergency Department in conjunction with [facility] Patient reports low abd pain that worsened earlier in the day Exam : awake/alert,LLQ tenderness noted Plan: admit Needs IV antibiotics Needs surgical consult     Ripley Fraise, MD 07/22/16 [phone]

## 2016-07-22 NOTE — H&P (Signed)
History and Physical    Crystal Madden [id number] DOB: Oct 06, 1975 DOA: 07/21/2016  PCP: Eloise Levels, NP  Patient coming from: Home.  Chief Complaint: Abdominal pain.  HPI: Crystal Madden is a 40 y.o. female with no significant past medical history presents to the ER with complaints of abdominal pain. Patient's symptoms started yesterday morning with abdominal pain which gradually worsened. Denies any fever or chills nausea vomiting or diarrhea. Pain is mostly in the left lower quadrant. Pain started radiating across the lower abdomen. CT of the abdomen and pelvis done shows sigmoid diverticulitis with microperforation. On-call surgeon was consulted and patient is being admitted for further observation and management.   ED Course: CT abdomen and pelvis done shows sigmoid diverticulitis with pericolonic inflammation and microperforation.  Review of Systems: As per HPI, rest all negative.   Past Medical History:  Diagnosis Date  . Hyperlipidemia   . Obesity (BMI 30-39.9)     Past Surgical History:  Procedure Laterality Date  . BLADDER SUSPENSION    . CESAREAN SECTION       reports that she has never smoked. She has never used smokeless tobacco. She reports that she drinks alcohol. She reports that she does not use drugs.  No Known Allergies  Family History  Problem Relation Age of Onset  . Hypertension Mother   . Colon cancer Neg Hx     Prior to Admission medications   Medication Sig Start Date End Date Taking? Authorizing Provider  ALPRAZolam Duanne Moron) 0.5 MG tablet Take 0.25-0.5 mg by mouth 2 (two) times daily as needed. 07/17/16  Yes Historical Provider, MD  etonogestrel-ethinyl estradiol (NUVARING) 0.12-0.015 MG/24HR vaginal ring Place 1 each vaginally every 28 (twenty-eight) days. Insert vaginally and leave in place for 3 consecutive weeks, then remove for 1 week.   Yes Historical Provider, MD  FLUoxetine (PROZAC) 20 MG capsule Take 20 mg by mouth daily.  07/14/16  Yes Historical Provider, MD  ibuprofen (ADVIL,MOTRIN) 100 MG tablet Take 600 mg by mouth every 6 (six) hours as needed for fever.    Yes Historical Provider, MD  simethicone (MYLICON) 80 MG chewable tablet Chew 80 mg by mouth every 6 (six) hours as needed for flatulence.   Yes Historical Provider, MD  levonorgestrel (MIRENA) 20 MCG/24HR IUD 1 each by Intrauterine route once.    Historical Provider, MD  naproxen (NAPROSYN) 500 MG tablet Take 1 tablet (500 mg total) by mouth 2 (two) times daily with a meal. Patient not taking: Reported on 07/21/2016 11/05/15   Mary-Margaret Hassell Done, FNP  tiZANidine (ZANAFLEX) 4 MG tablet Take 1 tablet (4 mg total) by mouth every 6 (six) hours as needed for muscle spasms. Patient not taking: Reported on 07/21/2016 11/05/15   Chevis Pretty, FNP    Physical Exam: Vitals:   07/21/16 1839 07/21/16 1840 07/21/16 2104 07/22/16 0117  BP: 133/63  131/63 122/65  Pulse: 107  108 98  Resp: 18  18 16   Temp: 98.6 F (37 C)     TempSrc: Oral     SpO2: 100%  95% 98%  Weight:  97.5 kg (214 lb 14.4 oz)    Height:  5\' 6"  (1.676 m)        Constitutional: Moderately built and nourished. Vitals:   07/21/16 1839 07/21/16 1840 07/21/16 2104 07/22/16 0117  BP: 133/63  131/63 122/65  Pulse: 107  108 98  Resp: 18  18 16   Temp: 98.6 F (37 C)     TempSrc: Oral  SpO2: 100%  95% 98%  Weight:  97.5 kg (214 lb 14.4 oz)    Height:  5\' 6"  (1.676 m)     Eyes: Anicteric no pallor. ENMT: No discharge from the ears eyes nose or mouth. Neck: No mass felt. No neck rigidity. Respiratory: No rhonchi or crepitations. Cardiovascular: S1 and S2 heard no murmurs appreciated. Abdomen: Tenderness in the left lower quadrant. Soft. Bowel sounds present no guarding or rigidity. Musculoskeletal: No edema. Skin: Appears warm. No rash. Neurologic: Alert awake oriented to time place and person. Moves all extremities. Psychiatric: Appears normal. Normal affect.   Labs on  Admission: I have personally reviewed following labs and imaging studies  CBC:  Recent Labs Lab 07/21/16 1844  WBC 15.2*  HGB 13.7  HCT 38.8  MCV 88.8  PLT 0000000   Basic Metabolic Panel:  Recent Labs Lab 07/21/16 1844  NA 136  K 3.5  CL 107  CO2 20*  GLUCOSE 94  BUN 10  CREATININE 0.83  CALCIUM 8.9   GFR: Estimated Creatinine Clearance: 108.2 mL/min (by C-G formula based on SCr of 0.83 mg/dL). Liver Function Tests:  Recent Labs Lab 07/21/16 1844  AST 20  ALT 14  ALKPHOS 53  BILITOT 1.7*  PROT 6.9  ALBUMIN 3.7    Recent Labs Lab 07/21/16 1844  LIPASE 19   No results for input(s): AMMONIA in the last 168 hours. Coagulation Profile: No results for input(s): INR, PROTIME in the last 168 hours. Cardiac Enzymes: No results for input(s): CKTOTAL, CKMB, CKMBINDEX, TROPONINI in the last 168 hours. BNP (last 3 results) No results for input(s): PROBNP in the last 8760 hours. HbA1C: No results for input(s): HGBA1C in the last 72 hours. CBG: No results for input(s): GLUCAP in the last 168 hours. Lipid Profile: No results for input(s): CHOL, HDL, LDLCALC, TRIG, CHOLHDL, LDLDIRECT in the last 72 hours. Thyroid Function Tests: No results for input(s): TSH, T4TOTAL, FREET4, T3FREE, THYROIDAB in the last 72 hours. Anemia Panel: No results for input(s): VITAMINB12, FOLATE, FERRITIN, TIBC, IRON, RETICCTPCT in the last 72 hours. Urine analysis:    Component Value Date/Time   COLORURINE YELLOW 07/21/2016 1922   APPEARANCEUR CLEAR 07/21/2016 1922   LABSPEC 1.025 07/21/2016 1922   PHURINE 5.5 07/21/2016 1922   GLUCOSEU NEGATIVE 07/21/2016 1922   HGBUR TRACE (A) 07/21/2016 [place] NEGATIVE 07/21/2016 1922   KETONESUR 15 (A) 07/21/2016 1922   PROTEINUR NEGATIVE 07/21/2016 1922   NITRITE NEGATIVE 07/21/2016 1922   LEUKOCYTESUR TRACE (A) 07/21/2016 1922   Sepsis Labs: @LABRCNTIP (procalcitonin:4,lacticidven:4) )No results found for this or any previous  visit (from the past 240 hour(s)).   Radiological Exams on Admission: Ct Abdomen Pelvis W Contrast  Result Date: 07/22/2016 CLINICAL DATA:  Lower abdominal pain radiating to lower back since this morning. Associated urinary retention. EXAM: CT ABDOMEN AND PELVIS WITH CONTRAST TECHNIQUE: Multidetector CT imaging of the abdomen and pelvis was performed using the standard protocol following bolus administration of intravenous contrast. CONTRAST:  120mL ISOVUE-300 IOPAMIDOL (ISOVUE-300) INJECTION 61% COMPARISON:  None. FINDINGS: LOWER CHEST: Lung bases are clear. Included heart size is normal. No pericardial effusion. HEPATOBILIARY: Liver and gallbladder are normal. PANCREAS: Normal. SPLEEN: Normal. ADRENALS/URINARY TRACT: Kidneys are orthotopic, demonstrating symmetric enhancement. No nephrolithiasis, hydronephrosis or solid renal masses. The unopacified ureters are normal in course and caliber. Urinary bladder is partially distended and unremarkable. Normal adrenal glands. STOMACH/BOWEL: The stomach, small and large bowel are normal in course. Moderate degree of pericolonic inflammation without abscess  is noted along the distal sigmoid colon consistent with diverticulitis with microperforation given at least 4 dots of free air noted, best seen on coronal image 62. Moderate degree of colonic thickening from inflammation is seen. No bowel obstruction is noted. Small amount of free fluid in the pelvis. Normal appendix. VASCULAR/LYMPHATIC: Aortoiliac vessels are normal in course and caliber. No lymphadenopathy by CT size criteria. REPRODUCTIVE: Normal.  Diaphragm is noted in place for bold OTHER: No intraperitoneal free fluid or free air. MUSCULOSKELETAL: Nonacute. Limbus vertebra at L2. Degenerative disc disease at L2-3. IMPRESSION: Acute sigmoid diverticulitis with tiny foci of free intraperitoneal air and moderate pericolonic inflammation. No bowel obstruction or abscess. Moderate transmural thickening of the  sigmoid in the affected area. Small amount of free fluid is noted in the pelvis. Electronically Signed   By: Ashley Royalty M.D.   On: 07/22/2016 00:25    Assessment/Plan Principal Problem:   Diverticulitis of sigmoid colon Active Problems:   Sigmoid diverticulitis    1. Acute sigmoid diverticulitis with microperforation - appreciate general surgery consult and recommendations. Patient has been kept nothing by mouth on IV fluids and pain relief medications. Patient is placed on IV Zosyn. Closely follow. Further recommendations per surgery.   DVT prophylaxis: SCDs. Code Status: Full code.  Family Communication: Discussed with patient.  Disposition Plan: Home.  Consults called: General surgery.  Admission status: Inpatient.    Rise Patience MD [facility] Pager [phone].  If 7PM-7AM, please contact night-coverage [email or web address] Password [email or web address]  07/22/2016, 2:41 AM

## 2016-07-22 NOTE — Progress Notes (Signed)
Patient ID: Crystal Madden, female   DOB: [birth year], 40 y.o.   MRN: [id number]  PROGRESS NOTE    EFFY SUPERNAW  [id number] DOB: 06/07/1976 DOA: 07/21/2016  PCP: Eloise Levels, NP   Brief Narrative:  40 y.o. female with no significant past medical history who presented to [facility] with worsening abdominal pain in the left lower quadrant started the day prior to the admission. Abdominal pain gradually worsened, 10 out of 10 at worst. Pain was present at rest and with movement. Patient did not have associated fevers or chills. No associated nausea or vomiting or blood in the stool. Patient was subsequently found to have acute sigmoid diverticulitis with perforation based on CT scan. Patient was seen by surgery in consultation.  Assessment & Plan:   Principal Problem:   Abdominal pain, left lower quadrant / Diverticulitis of sigmoid colon / Leukocytosis - CT abdomen on the admission showed acute sigmoid diverticulitis with moderate pericolonic perforation - Appreciate surgery following an their recommendations - Continue conservative management this point with nothing by mouth, IV fluids and analgesia as needed - Continue Zosyn   Active Problems:   Hypokalemia - Due to diverticulitis  - Supplemented  - Check BMP in am    DVT prophylaxis: Lovenox subcutaneous Code Status: full code  Family Communication: Family at the bedside Disposition Plan: Not yet stable for discharge, will be ready for discharge once acute sigmoid diverticulitis resolved and patient tolerates regular diet   Consultants:   Surgery   Procedures:   None   Antimicrobials:   Zosyn 07/22/2016 -->    Subjective: Pain is better with analgesia.   Objective: Vitals:   07/21/16 2104 07/22/16 0117 07/22/16 0220 07/22/16 0549  BP: 131/63 122/65 (!) 141/75 126/68  Pulse: 108 98 96 90  Resp: 18 16 18 16   Temp:   98.6 F (37 C) 98.6 F (37 C)  TempSrc:   Oral Oral  SpO2: 95% 98% 95%  98%  Weight:   97.1 kg (214 lb)   Height:   5\' 6"  (1.676 m)     Intake/Output Summary (Last 24 hours) at 07/22/16 1026 Last data filed at 07/22/16 0842  Gross per 24 hour  Intake             1050 ml  Output                0 ml  Net             1050 ml   Filed Weights   07/21/16 1836 07/21/16 1840 07/22/16 0220  Weight: 97.3 kg (214 lb 9 oz) 97.5 kg (214 lb 14.4 oz) 97.1 kg (214 lb)    Examination:  General exam: Appears calm and comfortable  Respiratory system: Clear to auscultation. Respiratory effort normal. Cardiovascular system: S1 & S2 heard, RRR. No JVD, murmurs, rubs, gallops or clicks. No pedal edema. Gastrointestinal system: left lower abdominal pain, no rebound, no guarding, (+) BS Central nervous system: Alert and oriented. No focal neurological deficits. Extremities: Symmetric 5 x 5 power. Skin: No rashes, lesions or ulcers Psychiatry: Judgement and insight appear normal. Mood & affect appropriate.   Data Reviewed: I have personally reviewed following labs and imaging studies  CBC:  Recent Labs Lab 07/21/16 1844 07/22/16 0542  WBC 15.2* 11.3*  NEUTROABS  --  8.9*  HGB 13.7 12.3  HCT 38.8 36.2  MCV 88.8 89.6  PLT 162 123456*   Basic Metabolic Panel:  Recent Labs Lab 07/21/16 1844  07/22/16 0542  NA 136 137  K 3.5 3.4*  CL 107 108  CO2 20* 21*  GLUCOSE 94 120*  BUN 10 5*  CREATININE 0.83 0.79  CALCIUM 8.9 8.2*   GFR: Estimated Creatinine Clearance: 112 mL/min (by C-G formula based on SCr of 0.79 mg/dL). Liver Function Tests:  Recent Labs Lab 07/21/16 1844 07/22/16 0542  AST 20 14*  ALT 14 11*  ALKPHOS 53 44  BILITOT 1.7* 2.0*  PROT 6.9 5.7*  ALBUMIN 3.7 3.0*    Recent Labs Lab 07/21/16 1844  LIPASE 19   No results for input(s): AMMONIA in the last 168 hours. Coagulation Profile: No results for input(s): INR, PROTIME in the last 168 hours. Cardiac Enzymes: No results for input(s): CKTOTAL, CKMB, CKMBINDEX, TROPONINI in the last  168 hours. BNP (last 3 results) No results for input(s): PROBNP in the last 8760 hours. HbA1C: No results for input(s): HGBA1C in the last 72 hours. CBG: No results for input(s): GLUCAP in the last 168 hours. Lipid Profile: No results for input(s): CHOL, HDL, LDLCALC, TRIG, CHOLHDL, LDLDIRECT in the last 72 hours. Thyroid Function Tests: No results for input(s): TSH, T4TOTAL, FREET4, T3FREE, THYROIDAB in the last 72 hours. Anemia Panel: No results for input(s): VITAMINB12, FOLATE, FERRITIN, TIBC, IRON, RETICCTPCT in the last 72 hours. Urine analysis:    Component Value Date/Time   COLORURINE YELLOW 07/21/2016 1922   APPEARANCEUR CLEAR 07/21/2016 1922   LABSPEC 1.025 07/21/2016 1922   PHURINE 5.5 07/21/2016 1922   GLUCOSEU NEGATIVE 07/21/2016 1922   HGBUR TRACE (A) 07/21/2016 [place] NEGATIVE 07/21/2016 1922   KETONESUR 15 (A) 07/21/2016 1922   PROTEINUR NEGATIVE 07/21/2016 1922   NITRITE NEGATIVE 07/21/2016 1922   LEUKOCYTESUR TRACE (A) 07/21/2016 1922   Sepsis Labs: @LABRCNTIP (procalcitonin:4,lacticidven:4)   )No results found for this or any previous visit (from the past 240 hour(s)).    Radiology Studies: Ct Abdomen Pelvis W Contrast Result Date: 07/22/2016 Acute sigmoid diverticulitis with tiny foci of free intraperitoneal air and moderate pericolonic inflammation. No bowel obstruction or abscess. Moderate transmural thickening of the sigmoid in the affected area. Small amount of free fluid is noted in the pelvis.     Scheduled Meds: . enoxaparin (LOVENOX) injection  40 mg Subcutaneous Q24H  . piperacillin-tazobactam (ZOSYN)  IV  3.375 g Intravenous Q8H   Continuous Infusions: . dextrose 5 % and 0.9% NaCl 100 mL/hr at 07/22/16 0308     LOS: 0 days    Greater than 50% of the time spent on counseling and coordinating the care.   Leisa Lenz, MD [facility] Pager [phone]  If 7PM-7AM, please contact  night-coverage [email or web address] Password [email or web address] 07/22/2016, 10:26 AM

## 2016-07-22 NOTE — Consult Note (Signed)
Surgical Consultation Requesting provider: Dr. Christy Gentles  CC: abdominal pain  HPI: Otherwise healthy 40yo woman employed in [place] who presents with abdominal pain. The pain began this morning and was in the lower abdomen. It was crampy in nature, non-radiating and she thought it was perhaps gas pain. It continued throughout the day. She had a soft, non-bloody bowel movement in the early afternoon which did not relieve her symptoms. She denies any associated nausea, emesis, or diffuse abdominal pain but does note urinary retention; has only recently been able to partially empty her bladder. She presented to the ER around 6pm with worsening pain and urinary retention. No prior colon surgery, no prior colonoscopy, no family history of colon cancer. Prior surgeries include a bladder sling in 2012 and then a c-section in 2015.   She has never had similar symptoms.  No Known Allergies  Past Medical History:  Diagnosis Date  . Hyperlipidemia   . Obesity (BMI 30-39.9)     Past Surgical History:  Procedure Laterality Date  . BLADDER SUSPENSION    . CESAREAN SECTION      No family history on file. Denies fh cancer or colon disease  Social History   Social History  . Marital status: Married    Spouse name: N/A  . Number of children: N/A  . Years of education: N/A   Social History Main Topics  . Smoking status: Never Smoker  . Smokeless tobacco: Never Used  . Alcohol use Yes  . Drug use: No  . Sexual activity: Not Asked   Other Topics Concern  . None   Social History Narrative  . None    No current facility-administered medications on file prior to encounter.    Current Outpatient Prescriptions on File Prior to Encounter  Medication Sig Dispense Refill  . ibuprofen (ADVIL,MOTRIN) 100 MG tablet Take 600 mg by mouth every 6 (six) hours as needed for fever.     Marland Kitchen levonorgestrel (MIRENA) 20 MCG/24HR IUD 1 each by Intrauterine route once.    . naproxen  (NAPROSYN) 500 MG tablet Take 1 tablet (500 mg total) by mouth 2 (two) times daily with a meal. (Patient not taking: Reported on 07/21/2016) 60 tablet 1  . tiZANidine (ZANAFLEX) 4 MG tablet Take 1 tablet (4 mg total) by mouth every 6 (six) hours as needed for muscle spasms. (Patient not taking: Reported on 07/21/2016) 30 tablet 0    Review of Systems: a complete, 10pt review of systems was completed with pertinent positives and negatives as documented in the HPI.   Physical Exam: Vitals:   07/21/16 2104 07/22/16 0117  BP: 131/63 122/65  Pulse: 108 98  Resp: 18 16  Temp:     Gen: A&Ox3, no distress  Head: normocephalic, atraumatic, EOMI, anicteric.  Neck: supple without mass or thyromegaly Chest: unlabored respirations   Cardiovascular: RRR, no pedal edema Abdomen: Soft, nondistended, tender in lower abdomen without guarding or peritoneal signs. No palpable hernia or mass.  Extremities: warm, without edema, no deformities  Neuro: grossly intact Psych: appropriate mood and affect. Normal insight.  Skin: limited exam reveals no rash or lesions  CBC Latest Ref Rng & Units 07/21/2016  WBC 4.0 - 10.5 K/uL 15.2(H)  Hemoglobin 12.0 - 15.0 g/dL 13.7  Hematocrit 36.0 - 46.0 % 38.8  Platelets 150 - 400 K/uL 162    CMP Latest Ref Rng & Units 07/21/2016  Glucose 65 - 99 mg/dL 94  BUN 6 - 20 mg/dL 10  Creatinine 0.44 - 1.00 mg/dL 0.83  Sodium 135 - 145 mmol/L 136  Potassium 3.5 - 5.1 mmol/L 3.5  Chloride 101 - 111 mmol/L 107  CO2 22 - 32 mmol/L 20(L)  Calcium 8.9 - 10.3 mg/dL 8.9  Total Protein 6.5 - 8.1 g/dL 6.9  Total Bilirubin 0.3 - 1.2 mg/dL 1.7(H)  Alkaline Phos 38 - 126 U/L 53  AST 15 - 41 U/L 20  ALT 14 - 54 U/L 14    No results found for: INR, PROTIME  Imaging: CLINICAL DATA:  Lower abdominal pain radiating to lower back since this morning. Associated urinary retention.  EXAM: CT ABDOMEN AND PELVIS WITH CONTRAST  TECHNIQUE: Multidetector CT imaging of the abdomen  and pelvis was performed using the standard protocol following bolus administration of intravenous contrast.  CONTRAST:  182mL ISOVUE-300 IOPAMIDOL (ISOVUE-300) INJECTION 61%  COMPARISON:  None.  FINDINGS: LOWER CHEST: Lung bases are clear. Included heart size is normal. No pericardial effusion.  HEPATOBILIARY: Liver and gallbladder are normal.  PANCREAS: Normal.  SPLEEN: Normal.  ADRENALS/URINARY TRACT: Kidneys are orthotopic, demonstrating symmetric enhancement. No nephrolithiasis, hydronephrosis or solid renal masses. The unopacified ureters are normal in course and caliber. Urinary bladder is partially distended and unremarkable. Normal adrenal glands.  STOMACH/BOWEL: The stomach, small and large bowel are normal in course. Moderate degree of pericolonic inflammation without abscess is noted along the distal sigmoid colon consistent with diverticulitis with microperforation given at least 4 dots of free air noted, best seen on coronal image 62. Moderate degree of colonic thickening from inflammation is seen. No bowel obstruction is noted. Small amount of free fluid in the pelvis. Normal appendix.  VASCULAR/LYMPHATIC: Aortoiliac vessels are normal in course and caliber. No lymphadenopathy by CT size criteria.  REPRODUCTIVE: Normal.  Diaphragm is noted in place for bold  OTHER: No intraperitoneal free fluid or free air.  MUSCULOSKELETAL: Nonacute. Limbus vertebra at L2. Degenerative disc disease at L2-3.  IMPRESSION: Acute sigmoid diverticulitis with tiny foci of free intraperitoneal air and moderate pericolonic inflammation. No bowel obstruction or abscess. Moderate transmural thickening of the sigmoid in the affected area. Small amount of free fluid is noted in the pelvis.   Electronically Signed   By: Ashley Royalty M.D.   On: 07/22/2016 00:25  A/P: 40yo woman with sigmoid divercitulitis with microperforation. -NPO (a few ice chips ok), fluid  resuscitation, empiric Zosyn -Continue above until WBC normalized, pain resolved and having bowel function. -Supportive care/symptom relief; may need foley placement  Will continue to follow along. Thank you for including [place] in this patient's care.   Romana Juniper, MD [facility], [place] Pager [phone]

## 2016-07-23 DIAGNOSIS — K5732 Diverticulitis of large intestine without perforation or abscess without bleeding: Secondary | ICD-10-CM

## 2016-07-23 DIAGNOSIS — D72829 Elevated white blood cell count, unspecified: Secondary | ICD-10-CM

## 2016-07-23 DIAGNOSIS — R1032 Left lower quadrant pain: Secondary | ICD-10-CM

## 2016-07-23 DIAGNOSIS — E876 Hypokalemia: Secondary | ICD-10-CM

## 2016-07-23 LAB — CBC
HEMATOCRIT: 33 % — AB (ref 36.0–46.0)
Hemoglobin: 10.9 g/dL — ABNORMAL LOW (ref 12.0–15.0)
MCH: 30.2 pg (ref 26.0–34.0)
MCHC: 33 g/dL (ref 30.0–36.0)
MCV: 91.4 fL (ref 78.0–100.0)
PLATELETS: 158 10*3/uL (ref 150–400)
RBC: 3.61 MIL/uL — ABNORMAL LOW (ref 3.87–5.11)
RDW: 12.6 % (ref 11.5–15.5)
WBC: 8.2 10*3/uL (ref 4.0–10.5)

## 2016-07-23 LAB — BASIC METABOLIC PANEL
Anion gap: 5 (ref 5–15)
BUN: 6 mg/dL (ref 6–20)
CO2: 23 mmol/L (ref 22–32)
CREATININE: 0.78 mg/dL (ref 0.44–1.00)
Calcium: 8.2 mg/dL — ABNORMAL LOW (ref 8.9–10.3)
Chloride: 109 mmol/L (ref 101–111)
GFR calc Af Amer: >60 mL/min (ref >60)
GFR calc non Af Amer: >60 mL/min (ref >60)
GLUCOSE: 116 mg/dL — AB (ref 65–99)
POTASSIUM: 4 mmol/L (ref 3.5–5.1)
SODIUM: 137 mmol/L (ref 135–145)

## 2016-07-23 MED ORDER — ALPRAZOLAM 0.25 MG PO TABS: 0.2500 mg | ORAL_TABLET | Freq: Two times a day (BID) | ORAL | Status: DC | PRN

## 2016-07-23 NOTE — Progress Notes (Signed)
Subjective: She still has some tenderness, but says pain is much better. Pain is worst in LLQ.    Objective: Vital signs in last 24 hours: Temp:  [97.5 F (36.4 C)-99.6 F (37.6 C)] 97.5 F (36.4 C) (12/13 0611) Pulse Rate:  [88-93] 88 (12/13 0611) Resp:  [17] 17 (12/13 0611) BP: (103-120)/(51-69) 103/51 (12/13 0611) SpO2:  [96 %-98 %] 98 % (12/13 0611) Last BM Date: 07/21/16 NPO 800 IV fluid recorded Voided x 4 Afebrile, VSS Labs OK WBC is down to normal H/H down  Intake/Output from previous day: 12/12 0701 - 12/13 0700 In: 800 [I.V.:800] Out: -  Intake/Output this shift: No intake/output data recorded.  General appearance: alert, cooperative and no distress GI: soft, much less tender today, but still having some discomfort.  + BS, no distension.   Lab Results:   Recent Labs  07/22/16 0542 07/23/16 0529  WBC 11.3* 8.2  HGB 12.3 10.9*  HCT 36.2 33.0*  PLT 146* 158    BMET  Recent Labs  07/22/16 0542 07/23/16 0529  NA 137 137  K 3.4* 4.0  CL 108 109  CO2 21* 23  GLUCOSE 120* 116*  BUN 5* 6  CREATININE 0.79 0.78  CALCIUM 8.2* 8.2*   PT/INR No results for input(s): LABPROT, INR in the last 72 hours.   Recent Labs Lab 07/21/16 1844 07/22/16 0542  AST 20 14*  ALT 14 11*  ALKPHOS 53 44  BILITOT 1.7* 2.0*  PROT 6.9 5.7*  ALBUMIN 3.7 3.0*     Lipase     Component Value Date/Time   LIPASE 19 07/21/2016 1844     Studies/Results: Ct Abdomen Pelvis W Contrast  Result Date: 07/22/2016 CLINICAL DATA:  Lower abdominal pain radiating to lower back since this morning. Associated urinary retention. EXAM: CT ABDOMEN AND PELVIS WITH CONTRAST TECHNIQUE: Multidetector CT imaging of the abdomen and pelvis was performed using the standard protocol following bolus administration of intravenous contrast. CONTRAST:  151mL ISOVUE-300 IOPAMIDOL (ISOVUE-300) INJECTION 61% COMPARISON:  None. FINDINGS: LOWER CHEST: Lung bases are clear. Included heart size is  normal. No pericardial effusion. HEPATOBILIARY: Liver and gallbladder are normal. PANCREAS: Normal. SPLEEN: Normal. ADRENALS/URINARY TRACT: Kidneys are orthotopic, demonstrating symmetric enhancement. No nephrolithiasis, hydronephrosis or solid renal masses. The unopacified ureters are normal in course and caliber. Urinary bladder is partially distended and unremarkable. Normal adrenal glands. STOMACH/BOWEL: The stomach, small and large bowel are normal in course. Moderate degree of pericolonic inflammation without abscess is noted along the distal sigmoid colon consistent with diverticulitis with microperforation given at least 4 dots of free air noted, best seen on coronal image 62. Moderate degree of colonic thickening from inflammation is seen. No bowel obstruction is noted. Small amount of free fluid in the pelvis. Normal appendix. VASCULAR/LYMPHATIC: Aortoiliac vessels are normal in course and caliber. No lymphadenopathy by CT size criteria. REPRODUCTIVE: Normal.  Diaphragm is noted in place for bold OTHER: No intraperitoneal free fluid or free air. MUSCULOSKELETAL: Nonacute. Limbus vertebra at L2. Degenerative disc disease at L2-3. IMPRESSION: Acute sigmoid diverticulitis with tiny foci of free intraperitoneal air and moderate pericolonic inflammation. No bowel obstruction or abscess. Moderate transmural thickening of the sigmoid in the affected area. Small amount of free fluid is noted in the pelvis. Electronically Signed   By: Ashley Royalty M.D.   On: 07/22/2016 00:25   Prior to Admission medications   Medication Sig Start Date End Date Taking? Authorizing Provider  ALPRAZolam Duanne Moron) 0.5 MG tablet Take 0.25-0.5 mg by  mouth 2 (two) times daily as needed. 07/17/16  Yes Historical Provider, MD  etonogestrel-ethinyl estradiol (NUVARING) 0.12-0.015 MG/24HR vaginal ring Place 1 each vaginally every 28 (twenty-eight) days. Insert vaginally and leave in place for 3 consecutive weeks, then remove for 1 week.    Yes Historical Provider, MD  FLUoxetine (PROZAC) 20 MG capsule Take 20 mg by mouth daily. 07/14/16  Yes Historical Provider, MD  ibuprofen (ADVIL,MOTRIN) 100 MG tablet Take 600 mg by mouth every 6 (six) hours as needed for fever.    Yes Historical Provider, MD  simethicone (MYLICON) 80 MG chewable tablet Chew 80 mg by mouth every 6 (six) hours as needed for flatulence.   Yes Historical Provider, MD  levonorgestrel (MIRENA) 20 MCG/24HR IUD 1 each by Intrauterine route once.    Historical Provider, MD  naproxen (NAPROSYN) 500 MG tablet Take 1 tablet (500 mg total) by mouth 2 (two) times daily with a meal. Patient not taking: Reported on 07/21/2016 11/05/15   Mary-Margaret Hassell Done, FNP  tiZANidine (ZANAFLEX) 4 MG tablet Take 1 tablet (4 mg total) by mouth every 6 (six) hours as needed for muscle spasms. Patient not taking: Reported on 07/21/2016 11/05/15   Mary-Margaret Hassell Done, FNP    Medications: . chlorhexidine  15 mL Mouth/Throat BID  . enoxaparin (LOVENOX) injection  40 mg Subcutaneous Q24H  . mouth rinse  15 mL Mouth Rinse BID  . piperacillin-tazobactam (ZOSYN)  IV  3.375 g Intravenous Q8H   . dextrose 5 % and 0.9 % NaCl with KCl 40 mEq/L 100 mL/hr at 07/23/16 0608    Assessment/Plan Acute sigmoid diverticulitis with microperforation Degenerative disc disease L2-3 FEN;  IV fluids/NPO. ID: Zosyn day 2 DVT: SCD - adding Lovenox but will watch platelets    Plan:  Continue antibiotics and start her on clear liquids. Restart xanax from home, she is not taking some of the other  medicines listed as home medicines.    LOS: 1 day    Crystal Madden 07/23/2016 [phone]

## 2016-07-23 NOTE — Consult Note (Signed)
   [facility] CM Inpatient Consult   07/23/2016  NAIRI ROMINGER [birth year] [id number]    Came to visit [employer] member on behalf of Link to [facility] Care Management program for [facility] employees/dependents with [employer] [facility] [employer] insurance. She currently denies any Link to Wellness needs at this time. Provided packet, contact information, and 24-hr nurse line magnet. Confirmed best contact number as [phone] for post discharge call. Appreciative of visit. Will refer for post hospital discharge call.    Marthenia Rolling, MSN-Ed, RN,BSN [facility] Liaison [phone]

## 2016-07-23 NOTE — Progress Notes (Signed)
PROGRESS NOTE    Crystal Madden  [id number] DOB: September 30, 1975 DOA: 07/21/2016 PCP: Eloise Levels, NP    Brief Narrative:  40 y.o.femalewith no significant past medical history who presented to [facility] with worsening abdominal pain in the left lower quadrant started the day prior to the admission. Abdominal pain gradually worsened, 10 out of 10 at worst. Pain was present at rest and with movement. Patient did not have associated fevers or chills. No associated nausea or vomiting or blood in the stool. Patient was subsequently found to have acute sigmoid diverticulitis with perforation based on CT scan. Patient was seen by surgery in consultation.   Assessment & Plan:   Principal Problem:   Abdominal pain, left lower quadrant Active Problems:   Diverticulitis of sigmoid colon   Leukocytosis   Hypokalemia   Abdominal pain, left lower quadrant / Diverticulitis of sigmoid colon / Leukocytosis - CT abdomen on the admission showed acute sigmoid diverticulitis with moderate pericolonic perforation - Appreciate surgery following an their recommendations - Continue conservative management - Patient on clear liquid diet, IV fluids and analgesia with diluadid - can transition to PO pain medication when tolerating PO - Continue Zosyn    Hypokalemia - Due to diverticulitis  - Supplemented  - WNL this am  DVT prophylaxis: lovenox Code Status: full code Family Communication: no family bedside Disposition Plan: pending clearance by surgery and improvement in pain, and able to tolerate diet   Consultants:   General Surgery  Procedures:   None  Antimicrobials:   Zosyn 07/22/2016    Subjective: Patient says she was updated to a clear liquid diet by surgery.  She says today her pain is better controlled.  She voices that she doesn't want to go home until she is ready because she cannot tolerate the pain she was experiencing yesterday at home.    Objective: Vitals:    07/22/16 0549 07/22/16 1407 07/22/16 2104 07/23/16 0611  BP: 126/68 120/69 119/65 (!) 103/51  Pulse: 90 93 91 88  Resp: 16 17 17 17   Temp: 98.6 F (37 C) 99.6 F (37.6 C) 98.6 F (37 C) 97.5 F (36.4 C)  TempSrc: Oral Oral Oral Oral  SpO2: 98% 96% 98% 98%  Weight:      Height:        Intake/Output Summary (Last 24 hours) at 07/23/16 1620 Last data filed at 07/23/16 1345  Gross per 24 hour  Intake             1600 ml  Output                0 ml  Net             1600 ml   Filed Weights   07/21/16 1836 07/21/16 1840 07/22/16 0220  Weight: 97.3 kg (214 lb 9 oz) 97.5 kg (214 lb 14.4 oz) 97.1 kg (214 lb)    Examination:  General exam: Appears calm and comfortable  Respiratory system: Clear to auscultation. Respiratory effort normal. Cardiovascular system: S1 & S2 heard, RRR. No JVD, murmurs, rubs, gallops or clicks. No pedal edema. Gastrointestinal system: Abdomen is nondistended, soft and diffusely tender throughout abdomen. No organomegaly or masses felt. Normal bowel sounds heard. Central nervous system: Alert and oriented. No focal neurological deficits. Extremities: Symmetric 5 x 5 power. Skin: No rashes, lesions or ulcers Psychiatry: Judgement and insight appear normal. Mood & affect appropriate.     Data Reviewed: I have personally reviewed following labs and imaging  studies  CBC:  Recent Labs Lab 07/21/16 1844 07/22/16 0542 07/23/16 0529  WBC 15.2* 11.3* 8.2  NEUTROABS  --  8.9*  --   HGB 13.7 12.3 10.9*  HCT 38.8 36.2 33.0*  MCV 88.8 89.6 91.4  PLT 162 146* 0000000   Basic Metabolic Panel:  Recent Labs Lab 07/21/16 1844 07/22/16 0542 07/23/16 0529  NA 136 137 137  K 3.5 3.4* 4.0  CL 107 108 109  CO2 20* 21* 23  GLUCOSE 94 120* 116*  BUN 10 5* 6  CREATININE 0.83 0.79 0.78  CALCIUM 8.9 8.2* 8.2*   GFR: Estimated Creatinine Clearance: 112 mL/min (by C-G formula based on SCr of 0.78 mg/dL). Liver Function Tests:  Recent Labs Lab  07/21/16 1844 07/22/16 0542  AST 20 14*  ALT 14 11*  ALKPHOS 53 44  BILITOT 1.7* 2.0*  PROT 6.9 5.7*  ALBUMIN 3.7 3.0*    Recent Labs Lab 07/21/16 1844  LIPASE 19   No results for input(s): AMMONIA in the last 168 hours. Coagulation Profile: No results for input(s): INR, PROTIME in the last 168 hours. Cardiac Enzymes: No results for input(s): CKTOTAL, CKMB, CKMBINDEX, TROPONINI in the last 168 hours. BNP (last 3 results) No results for input(s): PROBNP in the last 8760 hours. HbA1C: No results for input(s): HGBA1C in the last 72 hours. CBG: No results for input(s): GLUCAP in the last 168 hours. Lipid Profile: No results for input(s): CHOL, HDL, LDLCALC, TRIG, CHOLHDL, LDLDIRECT in the last 72 hours. Thyroid Function Tests: No results for input(s): TSH, T4TOTAL, FREET4, T3FREE, THYROIDAB in the last 72 hours. Anemia Panel: No results for input(s): VITAMINB12, FOLATE, FERRITIN, TIBC, IRON, RETICCTPCT in the last 72 hours. Sepsis Labs: No results for input(s): PROCALCITON, LATICACIDVEN in the last 168 hours.  No results found for this or any previous visit (from the past 240 hour(s)).       Radiology Studies: Ct Abdomen Pelvis W Contrast  Result Date: 07/22/2016 CLINICAL DATA:  Lower abdominal pain radiating to lower back since this morning. Associated urinary retention. EXAM: CT ABDOMEN AND PELVIS WITH CONTRAST TECHNIQUE: Multidetector CT imaging of the abdomen and pelvis was performed using the standard protocol following bolus administration of intravenous contrast. CONTRAST:  129mL ISOVUE-300 IOPAMIDOL (ISOVUE-300) INJECTION 61% COMPARISON:  None. FINDINGS: LOWER CHEST: Lung bases are clear. Included heart size is normal. No pericardial effusion. HEPATOBILIARY: Liver and gallbladder are normal. PANCREAS: Normal. SPLEEN: Normal. ADRENALS/URINARY TRACT: Kidneys are orthotopic, demonstrating symmetric enhancement. No nephrolithiasis, hydronephrosis or solid renal masses.  The unopacified ureters are normal in course and caliber. Urinary bladder is partially distended and unremarkable. Normal adrenal glands. STOMACH/BOWEL: The stomach, small and large bowel are normal in course. Moderate degree of pericolonic inflammation without abscess is noted along the distal sigmoid colon consistent with diverticulitis with microperforation given at least 4 dots of free air noted, best seen on coronal image 62. Moderate degree of colonic thickening from inflammation is seen. No bowel obstruction is noted. Small amount of free fluid in the pelvis. Normal appendix. VASCULAR/LYMPHATIC: Aortoiliac vessels are normal in course and caliber. No lymphadenopathy by CT size criteria. REPRODUCTIVE: Normal.  Diaphragm is noted in place for bold OTHER: No intraperitoneal free fluid or free air. MUSCULOSKELETAL: Nonacute. Limbus vertebra at L2. Degenerative disc disease at L2-3. IMPRESSION: Acute sigmoid diverticulitis with tiny foci of free intraperitoneal air and moderate pericolonic inflammation. No bowel obstruction or abscess. Moderate transmural thickening of the sigmoid in the affected area. Small amount of free  fluid is noted in the pelvis. Electronically Signed   By: Ashley Royalty M.D.   On: 07/22/2016 00:25        Scheduled Meds: . chlorhexidine  15 mL Mouth/Throat BID  . enoxaparin (LOVENOX) injection  40 mg Subcutaneous Q24H  . mouth rinse  15 mL Mouth Rinse BID  . piperacillin-tazobactam (ZOSYN)  IV  3.375 g Intravenous Q8H   Continuous Infusions: . dextrose 5 % and 0.9 % NaCl with KCl 40 mEq/L 100 mL/hr at 07/23/16 1345     LOS: 1 day    Time spent: 30 minutes    Loretha Stapler, MD [facility] Pager [phone]  If 7PM-7AM, please contact night-coverage [email or web address] Password [email or web address] 07/23/2016, 4:20 PM

## 2016-07-24 LAB — CBC
HEMATOCRIT: 29.7 % — AB (ref 36.0–46.0)
Hemoglobin: 9.9 g/dL — ABNORMAL LOW (ref 12.0–15.0)
MCH: 30.9 pg (ref 26.0–34.0)
MCHC: 33.3 g/dL (ref 30.0–36.0)
MCV: 92.8 fL (ref 78.0–100.0)
PLATELETS: 143 10*3/uL — AB (ref 150–400)
RBC: 3.2 MIL/uL — AB (ref 3.87–5.11)
RDW: 12.6 % (ref 11.5–15.5)
WBC: 5.8 10*3/uL (ref 4.0–10.5)

## 2016-07-24 MED ORDER — DOCUSATE SODIUM 100 MG PO CAPS
200.0000 mg | ORAL_CAPSULE | Freq: Two times a day (BID) | ORAL | Status: DC
  Administered 2016-07-24 – 2016-07-25 (×2): 200 mg via ORAL
  Filled 2016-07-24 (×2): qty 2

## 2016-07-24 MED ORDER — SACCHAROMYCES BOULARDII 250 MG PO CAPS
250.0000 mg | ORAL_CAPSULE | Freq: Two times a day (BID) | ORAL | Status: DC
Start: 2016-07-24 — End: 2016-07-25
  Administered 2016-07-24 – 2016-07-25 (×3): 250 mg via ORAL
  Filled 2016-07-24 (×3): qty 1

## 2016-07-24 MED ORDER — HYDROCODONE-ACETAMINOPHEN 5-325 MG PO TABS
1.0000 | ORAL_TABLET | ORAL | Status: DC | PRN
  Administered 2016-07-24 (×2): 2 via ORAL
  Filled 2016-07-24 (×2): qty 2

## 2016-07-24 MED ORDER — AMOXICILLIN-POT CLAVULANATE 875-125 MG PO TABS
1.0000 | ORAL_TABLET | Freq: Two times a day (BID) | ORAL | Status: DC
  Administered 2016-07-24 – 2016-07-25 (×3): 1 via ORAL
  Filled 2016-07-24 (×3): qty 1

## 2016-07-24 NOTE — Progress Notes (Signed)
  Subjective: She continues to improve, pain she had on admit is markedly improved.  Tolerating full liquids for breakfast.  No nausea, no BM so far.  Objective: Vital signs in last 24 hours: Temp:  [97.9 F (36.6 C)-98.6 F (37 C)] 98.6 F (37 C) (12/14 0628) Pulse Rate:  [71-80] 80 (12/14 0628) Resp:  [16] 16 (12/14 0628) BP: (98-120)/(50-73) 112/50 (12/14 0628) SpO2:  [93 %-100 %] 96 % (12/14 0628) Last BM Date: 07/21/16 120 PO recorded yesterday, 240 this AM  Voided x 5 800 IV yesterday Afebrile, VSS WBC 5.7 trending dow, H/H down some too  Intake/Output from previous day: 12/13 0701 - 12/14 0700 In: 1020 [P.O.:120; I.V.:800; IV Piggyback:100] Out: -  Intake/Output this shift: Total I/O In: 240 [P.O.:240] Out: -   General appearance: alert, cooperative and no distress GI: soft, she has some soreness, but pain she was feeling is better.    Lab Results:   Recent Labs  07/23/16 0529 07/24/16 0502  WBC 8.2 5.8  HGB 10.9* 9.9*  HCT 33.0* 29.7*  PLT 158 143*    BMET  Recent Labs  07/22/16 0542 07/23/16 0529  NA 137 137  K 3.4* 4.0  CL 108 109  CO2 21* 23  GLUCOSE 120* 116*  BUN 5* 6  CREATININE 0.79 0.78  CALCIUM 8.2* 8.2*   PT/INR No results for input(s): LABPROT, INR in the last 72 hours.   Recent Labs Lab 07/21/16 1844 07/22/16 0542  AST 20 14*  ALT 14 11*  ALKPHOS 53 44  BILITOT 1.7* 2.0*  PROT 6.9 5.7*  ALBUMIN 3.7 3.0*     Lipase     Component Value Date/Time   LIPASE 19 07/21/2016 1844     Studies/Results: No results found.  Medications: . chlorhexidine  15 mL Mouth/Throat BID  . enoxaparin (LOVENOX) injection  40 mg Subcutaneous Q24H  . mouth rinse  15 mL Mouth Rinse BID  . piperacillin-tazobactam (ZOSYN)  IV  3.375 g Intravenous Q8H    Assessment/Plan Acute sigmoid diverticulitis with microperforation Degenerative disc disease L2-3 FEN; IV fluids/full liquids last pm  ID: Zosyn day 4 DVT: SCD - adding Lovenox  but will watch platelets (143K today)  Plan: Advance diet and change to PO Augmentin.  Add probiotic.  If she does well she can go home later today and follow up with PCP.         LOS: 2 days    Crystal Madden 07/24/2016 [phone]

## 2016-07-24 NOTE — Progress Notes (Signed)
PROGRESS NOTE    Crystal Madden  [id number] DOB: 06-01-1976 DOA: 07/21/2016 PCP: Eloise Levels, NP    Brief Narrative:  40 y.o.femalewith no significant past medical history who presented to [facility] with worsening abdominal pain in the left lower quadrant started the day prior to the admission. Abdominal pain gradually worsened, 10 out of 10 at worst. Pain was present at rest and with movement. Patient did not have associated fevers or chills. No associated nausea or vomiting or blood in the stool. Patient was subsequently found to have acute sigmoid diverticulitis with perforation based on CT scan. Patient was seen by surgery in consultation.   Assessment & Plan:   Principal Problem:   Abdominal pain, left lower quadrant Active Problems:   Diverticulitis of sigmoid colon   Leukocytosis   Hypokalemia   Abdominal pain, left lower quadrant / Diverticulitis of sigmoid colon / Leukocytosis - CT abdomen on the admission showed acute sigmoid diverticulitis with moderate pericolonic perforation - Appreciate surgery following an their recommendations - Continue conservative management - Patient diet advancing as tolerated - PO Norco added, hydromorphone d/c'ed - Zosyn d/c'ed and Augmentin started - patient would like to see how she tolerates diet and PO pain medications prior to discharging    Hypokalemia - Due to diverticulitis  - Supplemented  - WNL this am  DVT prophylaxis: lovenox Code Status: full code Family Communication: no family bedside Disposition Plan: discharge likely in the am   Consultants:   General Surgery  Procedures:   None  Antimicrobials:   Zosyn 07/22/2016    Subjective: Patient states she feels comfortable transitioning to PO medication for antibiotics and pain management.  She is concerned that her pain and whether or not it will be as well controlled with the pills as it is with the IV.  She voices she would like to eat some  "real food" prior to leaving.    Objective: Vitals:   07/23/16 1753 07/23/16 2119 07/24/16 0628 07/24/16 1311  BP: (!) 98/51 120/73 (!) 112/50 (!) 112/48  Pulse: 78 71 80 64  Resp:  16 16 16   Temp: 97.9 F (36.6 C) 98.6 F (37 C) 98.6 F (37 C) 97.9 F (36.6 C)  TempSrc: Oral Oral Oral Oral  SpO2: 93% 100% 96% 100%  Weight:      Height:        Intake/Output Summary (Last 24 hours) at 07/24/16 1629 Last data filed at 07/24/16 1156  Gross per 24 hour  Intake              580 ml  Output                0 ml  Net              580 ml   Filed Weights   07/21/16 1836 07/21/16 1840 07/22/16 0220  Weight: 97.3 kg (214 lb 9 oz) 97.5 kg (214 lb 14.4 oz) 97.1 kg (214 lb)    Examination:  General exam: Appears calm and comfortable  Respiratory system: Clear to auscultation. Respiratory effort normal. Cardiovascular system: S1 & S2 heard, RRR. No JVD, murmurs, rubs, gallops or clicks. No pedal edema. Gastrointestinal system: Abdomen is nondistended, soft and mostly nontender. No organomegaly or masses felt. Normal bowel sounds heard. Central nervous system: Alert and oriented. No focal neurological deficits. Extremities: Symmetric 5 x 5 power. Skin: No rashes, lesions or ulcers Psychiatry: Judgement and insight appear normal. Mood & affect appropriate.  Data Reviewed: I have personally reviewed following labs and imaging studies  CBC:  Recent Labs Lab 07/21/16 1844 07/22/16 0542 07/23/16 0529 07/24/16 0502  WBC 15.2* 11.3* 8.2 5.8  NEUTROABS  --  8.9*  --   --   HGB 13.7 12.3 10.9* 9.9*  HCT 38.8 36.2 33.0* 29.7*  MCV 88.8 89.6 91.4 92.8  PLT 162 146* 158 [id number]*   Basic Metabolic Panel:  Recent Labs Lab 07/21/16 1844 07/22/16 0542 07/23/16 0529  NA 136 137 137  K 3.5 3.4* 4.0  CL 107 108 109  CO2 20* 21* 23  GLUCOSE 94 120* 116*  BUN 10 5* 6  CREATININE 0.83 0.79 0.78  CALCIUM 8.9 8.2* 8.2*   GFR: Estimated Creatinine Clearance: 112 mL/min (by C-G  formula based on SCr of 0.78 mg/dL). Liver Function Tests:  Recent Labs Lab 07/21/16 1844 07/22/16 0542  AST 20 14*  ALT 14 11*  ALKPHOS 53 44  BILITOT 1.7* 2.0*  PROT 6.9 5.7*  ALBUMIN 3.7 3.0*    Recent Labs Lab 07/21/16 1844  LIPASE 19   No results for input(s): AMMONIA in the last 168 hours. Coagulation Profile: No results for input(s): INR, PROTIME in the last 168 hours. Cardiac Enzymes: No results for input(s): CKTOTAL, CKMB, CKMBINDEX, TROPONINI in the last 168 hours. BNP (last 3 results) No results for input(s): PROBNP in the last 8760 hours. HbA1C: No results for input(s): HGBA1C in the last 72 hours. CBG: No results for input(s): GLUCAP in the last 168 hours. Lipid Profile: No results for input(s): CHOL, HDL, LDLCALC, TRIG, CHOLHDL, LDLDIRECT in the last 72 hours. Thyroid Function Tests: No results for input(s): TSH, T4TOTAL, FREET4, T3FREE, THYROIDAB in the last 72 hours. Anemia Panel: No results for input(s): VITAMINB12, FOLATE, FERRITIN, TIBC, IRON, RETICCTPCT in the last 72 hours. Sepsis Labs: No results for input(s): PROCALCITON, LATICACIDVEN in the last 168 hours.  No results found for this or any previous visit (from the past 240 hour(s)).       Radiology Studies: No results found.      Scheduled Meds: . amoxicillin-clavulanate  1 tablet Oral Q12H  . chlorhexidine  15 mL Mouth/Throat BID  . enoxaparin (LOVENOX) injection  40 mg Subcutaneous Q24H  . mouth rinse  15 mL Mouth Rinse BID  . saccharomyces boulardii  250 mg Oral BID   Continuous Infusions:    LOS: 2 days    Time spent: 30 minutes    Loretha Stapler, MD [facility] Pager [phone]  If 7PM-7AM, please contact night-coverage [email or web address] Password [facility] 07/24/2016, 4:29 PM

## 2016-07-24 NOTE — Discharge Instructions (Signed)
Diverticulitis Diverticulitis is inflammation or infection of small pouches in your colon that form when you have a condition called diverticulosis. The pouches in your colon are called diverticula. Your colon, or large intestine, is where water is absorbed and stool is formed. Complications of diverticulitis can include:  Bleeding.  Severe infection.  Severe pain.  Perforation of your colon.  Obstruction of your colon. What are the causes? Diverticulitis is caused by bacteria. Diverticulitis happens when stool becomes trapped in diverticula. This allows bacteria to grow in the diverticula, which can lead to inflammation and infection. What increases the risk? People with diverticulosis are at risk for diverticulitis. Eating a diet that does not include enough fiber from fruits and vegetables may make diverticulitis more likely to develop. What are the signs or symptoms? Symptoms of diverticulitis may include:  Abdominal pain and tenderness. The pain is normally located on the left side of the abdomen, but may occur in other areas.  Fever and chills.  Bloating.  Cramping.  Nausea.  Vomiting.  Constipation.  Diarrhea.  Blood in your stool. How is this diagnosed? Your health care provider will ask you about your medical history and do a physical exam. You may need to have tests done because many medical conditions can cause the same symptoms as diverticulitis. Tests may include:  Blood tests.  Urine tests.  Imaging tests of the abdomen, including X-rays and CT scans. When your condition is under control, your health care provider may recommend that you have a colonoscopy. A colonoscopy can show how severe your diverticula are and whether something else is causing your symptoms. How is this treated? Most cases of diverticulitis are mild and can be treated at home. Treatment may include:  Taking over-the-counter pain medicines.  Following a clear liquid diet.  Taking  antibiotic medicines by mouth for 7-10 days. More severe cases may be treated at a hospital. Treatment may include:  Not eating or drinking.  Taking prescription pain medicine.  Receiving antibiotic medicines through an IV tube.  Receiving fluids and nutrition through an IV tube.  Surgery. Follow these instructions at home:  Follow your health care providers instructions carefully.  Follow a full liquid diet or other diet as directed by your health care provider. After your symptoms improve, your health care provider may tell you to change your diet. He or she may recommend you eat a high-fiber diet. Fruits and vegetables are good sources of fiber. Fiber makes it easier to pass stool.  Take fiber supplements or probiotics as directed by your health care provider.  Only take medicines as directed by your health care provider.  Keep all your follow-up appointments. Contact a health care provider if:  Your pain does not improve.  You have a hard time eating food.  Your bowel movements do not return to normal. Get help right away if:  Your pain becomes worse.  Your symptoms do not get better.  Your symptoms suddenly get worse.  You have a fever.  You have repeated vomiting.  You have bloody or black, tarry stools. This information is not intended to replace advice given to you by your health care provider. Make sure you discuss any questions you have with your health care provider. Document Released: 05/07/2005 Document Revised: 01/03/2016 Document Reviewed: 06/22/2013 Elsevier Interactive Patient Education  2017 [place] For the next week then start transitioning to high fiber diet. A soft-food meal plan includes foods that are safe and easy to  swallow. This meal plan typically is used:  If you are having trouble chewing or swallowing foods.  As a transition meal plan after only having had liquid meals for a long period. What do I need to  know about the soft-food meal plan? A soft-food meal plan includes tender foods that are soft and easy to chew and swallow. In most cases, bite-sized pieces of food are easier to swallow. A bite-sized piece is about  inch or smaller. Foods in this plan do not need to be ground or pureed. Foods that are very hard, crunchy, or sticky should be avoided. Also, breads, cereals, yogurts, and desserts with nuts, seeds, or fruits should be avoided. What foods can I eat? Grains  Rice and wild rice. Moist bread, dressing, pasta, and noodles. Well-moistened dry or cooked cereals, such as farina (cooked wheat cereal), oatmeal, or grits. Biscuits, breads, muffins, pancakes, and waffles that have been well moistened. Vegetables  Shredded lettuce. Cooked, tender vegetables, including potatoes without skins. Vegetable juices. Broths or creamed soups made with vegetables that are not stringy or chewy. Strained tomatoes (without seeds). Fruits  Canned or well-cooked fruits. Soft (ripe), peeled fresh fruits, such as peaches, nectarines, kiwi, cantaloupe, honeydew melon, and watermelon (without seeds). Soft berries with small seeds, such as strawberries. Fruit juices (without pulp). Meats and Other Protein Sources  Moist, tender, lean beef. Mutton. Lamb. Veal. Chicken. [place]. Liver. Ham. Fish without bones. Eggs. Dairy  Milk, milk drinks, and cream. Plain cream cheese and cottage cheese. Plain yogurt. Sweets/Desserts  Flavored gelatin desserts. Custard. Plain ice cream, frozen yogurt, sherbet, milk shakes, and malts. Plain cakes and cookies. Plain hard candy. Other  Butter, margarine (without trans fat), and cooking oils. Mayonnaise. Cream sauces. Mild spices, salt, and sugar. Syrup, molasses, honey, and jelly. The items listed above may not be a complete list of recommended foods or beverages. Contact your dietitian for more options.  What foods are not recommended? Grains  Dry bread, toast, crackers that have  not been moistened. Coarse or dry cereals, such as bran, granola, and shredded wheat. Tough or chewy crusty breads, such as [place] bread or baguettes. Vegetables  Corn. Raw vegetables except shredded lettuce. Cooked vegetables that are tough or stringy. Tough, crisp, fried potatoes and potato skins. Fruits  Fresh fruits with skins or seeds or both, such as apples, pears, or grapes. Stringy, high-pulp fruits, such as papaya, pineapple, coconut, or mango. Fruit leather, fruit roll-ups, and all dried fruits. Meats and Other Protein Sources  Sausages and hot dogs. Meats with gristle. Fish with bones. Nuts, seeds, and chunky peanut or other nut butters. Sweets/Desserts  Cakes or cookies that are very dry or chewy. The items listed above may not be a complete list of foods and beverages to avoid. Contact your dietitian for more information.  This information is not intended to replace advice given to you by your health care provider. Make sure you discuss any questions you have with your health care provider. Document Released: 11/04/2007 Document Revised: 01/03/2016 Document Reviewed: 06/24/2013 Elsevier Interactive Patient Education  2017 [place].   High-Fiber Diet Fiber, also called dietary fiber, is a type of carbohydrate found in fruits, vegetables, whole grains, and beans. A high-fiber diet can have many health benefits. Your health care provider may recommend a high-fiber diet to help:  Prevent constipation. Fiber can make your bowel movements more regular.  Lower your cholesterol.  Relieve hemorrhoids, uncomplicated diverticulosis, or irritable bowel syndrome.  Prevent overeating as part of  a weight-loss plan.  Prevent heart disease, type 2 diabetes, and certain cancers. What is my plan? The recommended daily intake of fiber includes:  38 grams for men under age 77.  13 grams for men over age 7.  85 grams for women under age 81.  61 grams for women over age 93. You can  get the recommended daily intake of dietary fiber by eating a variety of fruits, vegetables, grains, and beans. Your health care provider may also recommend a fiber supplement if it is not possible to get enough fiber through your diet. What do I need to know about a high-fiber diet?  Fiber supplements have not been widely studied for their effectiveness, so it is better to get fiber through food sources.  Always check the fiber content on thenutrition facts label of any prepackaged food. Look for foods that contain at least 5 grams of fiber per serving.  Ask your dietitian if you have questions about specific foods that are related to your condition, especially if those foods are not listed in the following section.  Increase your daily fiber consumption gradually. Increasing your intake of dietary fiber too quickly may cause bloating, cramping, or gas.  Drink plenty of water. Water helps you to digest fiber. What foods can I eat? Grains  Whole-grain breads. Multigrain cereal. Oats and oatmeal. Brown rice. Barley. Bulgur wheat. [place]. Bran muffins. Popcorn. Rye wafer crackers. Vegetables  Sweet potatoes. Spinach. Kale. Artichokes. Cabbage. Broccoli. Green peas. Carrots. Squash. Fruits  Berries. Pears. Apples. Oranges. Avocados. Prunes and raisins. Dried figs. Meats and Other Protein Sources  [place], kidney, pinto, and soy beans. Split peas. Lentils. Nuts and seeds. Dairy  Fiber-fortified yogurt. Beverages  Fiber-fortified soy milk. Fiber-fortified orange juice. Other  Fiber bars. The items listed above may not be a complete list of recommended foods or beverages. Contact your dietitian for more options.  What foods are not recommended? Grains  White bread. Pasta made with refined flour. White rice. Vegetables  Fried potatoes. Canned vegetables. Well-cooked vegetables. Fruits  Fruit juice. Cooked, strained fruit. Meats and Other Protein Sources  Fatty cuts of meat. Fried Sales executive  or fried fish. Dairy  Milk. Yogurt. Cream cheese. Sour cream. Beverages  Soft drinks. Other  Cakes and pastries. Butter and oils. The items listed above may not be a complete list of foods and beverages to avoid. Contact your dietitian for more information.  What are some tips for including high-fiber foods in my diet?  Eat a wide variety of high-fiber foods.  Make sure that half of all grains consumed each day are whole grains.  Replace breads and cereals made from refined flour or white flour with whole-grain breads and cereals.  Replace white rice with brown rice, bulgur wheat, or millet.  Start the day with a breakfast that is high in fiber, such as a cereal that contains at least 5 grams of fiber per serving.  Use beans in place of meat in soups, salads, or pasta.  Eat high-fiber snacks, such as berries, raw vegetables, nuts, or popcorn. This information is not intended to replace advice given to you by your health care provider. Make sure you discuss any questions you have with your health care provider. Document Released: 07/28/2005 Document Revised: 01/03/2016 Document Reviewed: 01/10/2014 Elsevier Interactive Patient Education  2017 [employer].

## 2016-07-25 LAB — CBC
HEMATOCRIT: 32.7 % — AB (ref 36.0–46.0)
Hemoglobin: 11.3 g/dL — ABNORMAL LOW (ref 12.0–15.0)
MCH: 30.9 pg (ref 26.0–34.0)
MCHC: 34.6 g/dL (ref 30.0–36.0)
MCV: 89.3 fL (ref 78.0–100.0)
PLATELETS: 175 10*3/uL (ref 150–400)
RBC: 3.66 MIL/uL — ABNORMAL LOW (ref 3.87–5.11)
RDW: 12.1 % (ref 11.5–15.5)
WBC: 5.7 10*3/uL (ref 4.0–10.5)

## 2016-07-25 MED ORDER — AMOXICILLIN-POT CLAVULANATE 875-125 MG PO TABS: 1.0000 | ORAL_TABLET | Freq: Two times a day (BID) | ORAL | 0 refills | Status: AC

## 2016-07-25 MED ORDER — HYDROCODONE-ACETAMINOPHEN 5-325 MG PO TABS: 1.0000 | ORAL_TABLET | Freq: Four times a day (QID) | ORAL | 0 refills | Status: DC | PRN

## 2016-07-25 MED ORDER — ONDANSETRON HCL 4 MG PO TABS: 4.0000 mg | ORAL_TABLET | Freq: Four times a day (QID) | ORAL | 0 refills | Status: DC | PRN

## 2016-07-25 MED ORDER — ACETAMINOPHEN 325 MG PO TABS: 650.0000 mg | ORAL_TABLET | Freq: Four times a day (QID) | ORAL | Status: DC | PRN

## 2016-07-25 MED FILL — ONDANSETRON HCL 4 MG TABLET: 4 | 5 days supply | Qty: 20 | Fill #0

## 2016-07-25 MED FILL — HYDROCODON-APAP 5-325: 5-325 | 7 days supply | Qty: 15 | Fill #0

## 2016-07-25 MED FILL — AMOX-CLAV 875-125 MG TABLET: 875-125 | 10 days supply | Qty: 20 | Fill #0

## 2016-07-25 NOTE — Discharge Summary (Signed)
Physician Discharge Summary  Crystal Madden [id number] DOB: 1976/07/19 DOA: 07/21/2016  PCP: Eloise Levels, NP  Admit date: 07/21/2016 Discharge date: 07/25/2016  Admitted From: Home Disposition:  Home  Recommendations for Outpatient Follow-up:  1. Follow up with PCP in 1-2 weeks 2. Take new prescriptions as prescribed 3. Please obtain BMP/CBC in one week  Home Health:No  Equipment/Devices: None  Discharge Condition: Stable CODE STATUS: Full Code Diet recommendation: Regular Diet   Brief/Interim Summary: 40 y.o.femalewith no significant past medical historywho [facility] worsening abdominal pain in the left lower quadrant started the day prior to the admission. Abdominal pain gradually worsened, 10 out of 10 at worst. Pain was present at rest and with movement. Patient did not have associated fevers or chills. No associated nausea or vomiting or blood in the stool. Patient was subsequently found to have acute sigmoid diverticulitis with perforation based on CT scan. Patient was seen by surgery in consultation.  She improved with bowel rest, antibiotics, and pain medication.  She was stable for discharge on 12/15 and given antibiotics to finish a 14 day course and pain medication.  Discharge Diagnoses:  Principal Problem:   Abdominal pain, left lower quadrant Active Problems:   Diverticulitis of sigmoid colon   Leukocytosis   Hypokalemia  Abdominal pain, left lower quadrant / Diverticulitis of sigmoid colon / Leukocytosis - CT abdomen on the admission showed acute sigmoid diverticulitis with moderate pericolonic perforation - Appreciate surgery following an their recommendations - Continue conservative management - Patient diet advancing as tolerated - PO Norco added, hydromorphone d/c'ed - Zosyn d/c'ed and Augmentin started - patient would like to see how she tolerates diet and PO pain medications prior to discharging  Discharge  Instructions  Discharge Instructions    AMB Referral to [place] Management    Complete by:  As directed    Please assign UMR member for post discharge call. Currently at [facility]. Packet information provided. Thanks. Marthenia Rolling, MSN-Ed, RN,BSN [facility] [id number]   Reason for consult:  Please assign UMR member for post discharge call   Expected date of contact:  1-3 days (reserved for hospital discharges)   Call MD for:  difficulty breathing, headache or visual disturbances    Complete by:  As directed    Call MD for:  extreme fatigue    Complete by:  As directed    Call MD for:  persistant dizziness or light-headedness    Complete by:  As directed    Call MD for:  persistant nausea and vomiting    Complete by:  As directed    Call MD for:  severe uncontrolled pain    Complete by:  As directed    Call MD for:  temperature >100.4    Complete by:  As directed    Diet general    Complete by:  As directed    Increase activity slowly    Complete by:  As directed      Allergies as of 07/25/2016   No Known Allergies     Medication List    STOP taking these medications   ibuprofen 100 MG tablet Commonly known as:  ADVIL,MOTRIN   levonorgestrel 20 MCG/24HR IUD Commonly known as:  MIRENA   naproxen 500 MG tablet Commonly known as:  NAPROSYN   simethicone 80 MG chewable tablet Commonly known as:  MYLICON   tiZANidine 4 MG tablet Commonly known as:  ZANAFLEX     TAKE these  medications   acetaminophen 325 MG tablet Commonly known as:  TYLENOL Take 2 tablets (650 mg total) by mouth every 6 (six) hours as needed for mild pain (or Fever >/= 101).   ALPRAZolam 0.5 MG tablet Commonly known as:  XANAX Take 0.25-0.5 mg by mouth 2 (two) times daily as needed.   amoxicillin-clavulanate 875-125 MG tablet Commonly known as:  AUGMENTIN Take 1 tablet by mouth every 12 (twelve) hours.   etonogestrel-ethinyl estradiol 0.12-0.015 MG/24HR vaginal  ring Commonly known as:  [place] 1 each vaginally every 28 (twenty-eight) days. Insert vaginally and leave in place for 3 consecutive weeks, then remove for 1 week.   FLUoxetine 20 MG capsule Commonly known as:  PROZAC Take 20 mg by mouth daily.   HYDROcodone-acetaminophen 5-325 MG tablet Commonly known as:  NORCO/VICODIN Take 1 tablet by mouth every 6 (six) hours as needed for moderate pain.   ondansetron 4 MG tablet Commonly known as:  ZOFRAN Take 1 tablet (4 mg total) by mouth every 6 (six) hours as needed for nausea.      Follow-up Information    Eloise Levels, NP. Schedule an appointment as soon as possible for a visit in 2 week(s).   Specialty:  Nurse Practitioner Contact information: [phone] [place] [address] [phone]          No Known Allergies  Consultations:  General Surgery   Procedures/Studies: Ct Abdomen Pelvis W Contrast  Result Date: 07/22/2016 CLINICAL DATA:  Lower abdominal pain radiating to lower back since this morning. Associated urinary retention. EXAM: CT ABDOMEN AND PELVIS WITH CONTRAST TECHNIQUE: Multidetector CT imaging of the abdomen and pelvis was performed using the standard protocol following bolus administration of intravenous contrast. CONTRAST:  159mL ISOVUE-300 IOPAMIDOL (ISOVUE-300) INJECTION 61% COMPARISON:  None. FINDINGS: LOWER CHEST: Lung bases are clear. Included heart size is normal. No pericardial effusion. HEPATOBILIARY: Liver and gallbladder are normal. PANCREAS: Normal. SPLEEN: Normal. ADRENALS/URINARY TRACT: Kidneys are orthotopic, demonstrating symmetric enhancement. No nephrolithiasis, hydronephrosis or solid renal masses. The unopacified ureters are normal in course and caliber. Urinary bladder is partially distended and unremarkable. Normal adrenal glands. STOMACH/BOWEL: The stomach, small and large bowel are normal in course. Moderate degree of pericolonic inflammation without abscess is noted along  the distal sigmoid colon consistent with diverticulitis with microperforation given at least 4 dots of free air noted, best seen on coronal image 62. Moderate degree of colonic thickening from inflammation is seen. No bowel obstruction is noted. Small amount of free fluid in the pelvis. Normal appendix. VASCULAR/LYMPHATIC: Aortoiliac vessels are normal in course and caliber. No lymphadenopathy by CT size criteria. REPRODUCTIVE: Normal.  Diaphragm is noted in place for bold OTHER: No intraperitoneal free fluid or free air. MUSCULOSKELETAL: Nonacute. Limbus vertebra at L2. Degenerative disc disease at L2-3. IMPRESSION: Acute sigmoid diverticulitis with tiny foci of free intraperitoneal air and moderate pericolonic inflammation. No bowel obstruction or abscess. Moderate transmural thickening of the sigmoid in the affected area. Small amount of free fluid is noted in the pelvis. Electronically Signed   By: Ashley Royalty M.D.   On: 07/22/2016 00:25      Subjective: Patient says she feels well this morning.  Feels ready to discharge.  All questions answered. Tolerated dinner and breakfast without complaint.  Discharge Exam: Vitals:   07/24/16 2150 07/25/16 0623  BP: 124/74 114/64  Pulse: 76 61  Resp: 16 16  Temp: 98.2 F (36.8 C) 98.2 F (36.8 C)   Vitals:   07/24/16  [id number] 07/24/16 1311 07/24/16 2150 07/25/16 0623  BP: (!) 112/50 (!) 112/48 124/74 114/64  Pulse: 80 64 76 61  Resp: 16 16 16 16   Temp: 98.6 F (37 C) 97.9 F (36.6 C) 98.2 F (36.8 C) 98.2 F (36.8 C)  TempSrc: Oral Oral Oral Oral  SpO2: 96% 100% 100% 97%  Weight:      Height:        General: Pt is alert, awake, not in acute distress Cardiovascular: RRR, S1/S2 +, no rubs, no gallops Respiratory: CTA bilaterally, no wheezing, no rhonchi Abdominal: Soft, NT, ND, bowel sounds + Extremities: no edema, no cyanosis    The results of significant diagnostics from this hospitalization (including imaging, microbiology, ancillary  and laboratory) are listed below for reference.     Microbiology: No results found for this or any previous visit (from the past 240 hour(s)).   Labs: BNP (last 3 results) No results for input(s): BNP in the last 8760 hours. Basic Metabolic Panel:  Recent Labs Lab 07/21/16 1844 07/22/16 0542 07/23/16 0529  NA 136 137 137  K 3.5 3.4* 4.0  CL 107 108 109  CO2 20* 21* 23  GLUCOSE 94 120* 116*  BUN 10 5* 6  CREATININE 0.83 0.79 0.78  CALCIUM 8.9 8.2* 8.2*   Liver Function Tests:  Recent Labs Lab 07/21/16 1844 07/22/16 0542  AST 20 14*  ALT 14 11*  ALKPHOS 53 44  BILITOT 1.7* 2.0*  PROT 6.9 5.7*  ALBUMIN 3.7 3.0*    Recent Labs Lab 07/21/16 1844  LIPASE 19   No results for input(s): AMMONIA in the last 168 hours. CBC:  Recent Labs Lab 07/21/16 1844 07/22/16 0542 07/23/16 0529 07/24/16 0502 07/25/16 0931  WBC 15.2* 11.3* 8.2 5.8 5.7  NEUTROABS  --  8.9*  --   --   --   HGB 13.7 12.3 10.9* 9.9* 11.3*  HCT 38.8 36.2 33.0* 29.7* 32.7*  MCV 88.8 89.6 91.4 92.8 89.3  PLT 162 146* 158 143* 175   Cardiac Enzymes: No results for input(s): CKTOTAL, CKMB, CKMBINDEX, TROPONINI in the last 168 hours. BNP: Invalid input(s): POCBNP CBG: No results for input(s): GLUCAP in the last 168 hours. D-Dimer No results for input(s): DDIMER in the last 72 hours. Hgb A1c No results for input(s): HGBA1C in the last 72 hours. Lipid Profile No results for input(s): CHOL, HDL, LDLCALC, TRIG, CHOLHDL, LDLDIRECT in the last 72 hours. Thyroid function studies No results for input(s): TSH, T4TOTAL, T3FREE, THYROIDAB in the last 72 hours.  Invalid input(s): FREET3 Anemia work up No results for input(s): VITAMINB12, FOLATE, FERRITIN, TIBC, IRON, RETICCTPCT in the last 72 hours. Urinalysis    Component Value Date/Time   COLORURINE YELLOW 07/21/2016 1922   APPEARANCEUR CLEAR 07/21/2016 1922   LABSPEC 1.025 07/21/2016 1922   PHURINE 5.5 07/21/2016 1922   GLUCOSEU NEGATIVE  07/21/2016 1922   HGBUR TRACE (A) 07/21/2016 [place] NEGATIVE 07/21/2016 1922   KETONESUR 15 (A) 07/21/2016 1922   PROTEINUR NEGATIVE 07/21/2016 1922   NITRITE NEGATIVE 07/21/2016 1922   LEUKOCYTESUR TRACE (A) 07/21/2016 1922   Sepsis Labs Invalid input(s): PROCALCITONIN,  WBC,  LACTICIDVEN Microbiology No results found for this or any previous visit (from the past 240 hour(s)).   Time coordinating discharge: Less than 30 minutes  SIGNED:   Loretha Stapler, MD  [facility] 07/25/2016, 4:34 PM Pager [phone] If 7PM-7AM, please contact night-coverage [email or web address] Password [email or web address]

## 2016-07-25 NOTE — Progress Notes (Signed)
Crystal Madden to be D/C'd  per MD order. Discussed with the patient and all questions fully answered.  VSS, Skin clean, dry and intact without evidence of skin break down, no evidence of skin tears noted.  IV catheter discontinued intact. Site without signs and symptoms of complications. Dressing and pressure applied.  An After Visit Summary was printed and given to the patient. Patient received prescription.  D/c education completed with patient/family including follow up instructions, medication list, d/c activities limitations if indicated, with other d/c instructions as indicated by MD - patient able to verbalize understanding, all questions fully answered.   Patient instructed to return to ED, call 911, or call MD for any changes in condition.   Patient to be escorted via [place], and D/C home via private auto.

## 2016-07-25 NOTE — Progress Notes (Signed)
[facility] Progress Note     Subjective: No events overnight. Mild abdominal discomfort. Pt states she feels like she needs to have a BM and if she does she suspects the discomfort will go away. No nausea, vomiting, fever or chills.   Objective: Vital signs in last 24 hours: Temp:  [97.9 F (36.6 C)-98.2 F (36.8 C)] 98.2 F (36.8 C) (12/15 0623) Pulse Rate:  [61-76] 61 (12/15 0623) Resp:  [16] 16 (12/15 0623) BP: (112-124)/(48-74) 114/64 (12/15 0623) SpO2:  [97 %-100 %] 97 % (12/15 0623) Last BM Date: 07/21/16  Intake/Output from previous day: 12/14 0701 - 12/15 0700 In: 600 [P.O.:600] Out: -  Intake/Output this shift: No intake/output data recorded.  PE: Gen:  Alert, NAD, pleasant, cooperative Card:  RRR, no M/G/R heard Abd: Soft, ND, +BS, No TTP Skin: no rashes noted, warm and dry  Lab Results:   Recent Labs  07/23/16 0529 07/24/16 0502  WBC 8.2 5.8  HGB 10.9* 9.9*  HCT 33.0* 29.7*  PLT 158 143*   BMET  Recent Labs  07/23/16 0529  NA 137  K 4.0  CL 109  CO2 23  GLUCOSE 116*  BUN 6  CREATININE 0.78  CALCIUM 8.2*   PT/INR No results for input(s): LABPROT, INR in the last 72 hours. CMP     Component Value Date/Time   NA 137 07/23/2016 0529   K 4.0 07/23/2016 0529   CL 109 07/23/2016 0529   CO2 23 07/23/2016 0529   GLUCOSE 116 (H) 07/23/2016 0529   BUN 6 07/23/2016 0529   CREATININE 0.78 07/23/2016 0529   CALCIUM 8.2 (L) 07/23/2016 0529   PROT 5.7 (L) 07/22/2016 0542   ALBUMIN 3.0 (L) 07/22/2016 0542   AST 14 (L) 07/22/2016 0542   ALT 11 (L) 07/22/2016 0542   ALKPHOS 44 07/22/2016 0542   BILITOT 2.0 (H) 07/22/2016 0542   GFRNONAA >60 07/23/2016 0529   GFRAA >60 07/23/2016 0529   Lipase     Component Value Date/Time   LIPASE 19 07/21/2016 1844       Studies/Results: No results found.  Anti-infectives: Anti-infectives    Start     Dose/Rate Route Frequency Ordered Stop   07/24/16 1200  amoxicillin-clavulanate  (AUGMENTIN) 875-125 MG per tablet 1 tablet     1 tablet Oral Every 12 hours 07/24/16 1039     07/22/16 0800  piperacillin-tazobactam (ZOSYN) IVPB 3.375 g  Status:  Discontinued     3.375 g 12.5 mL/hr over 240 Minutes Intravenous Every 8 hours 07/22/16 0300 07/24/16 1039   07/22/16 0200  piperacillin-tazobactam (ZOSYN) IVPB 3.375 g  Status:  Discontinued     3.375 g 100 mL/hr over 30 Minutes Intravenous Every 6 hours 07/22/16 0157 07/22/16 0259   07/22/16 0100  piperacillin-tazobactam (ZOSYN) IVPB 3.375 g     3.375 g 100 mL/hr over 30 Minutes Intravenous  Once 07/22/16 0049 07/22/16 0149       Assessment/Plan  Acute sigmoid diverticulitis with microperforation Degenerative disc disease L2-3  FEN: regular diet, tolerating well ID: Zosyn 12/12>>12/14, Augmentin PO 12/14>> DVT: SCD & Lovenox   Plan: regular diet and PO Augmentin.  Add probiotic. Pt is okay for discharge from surgery standpoint. She can follow up with PCP.   LOS: 3 days    Kalman Drape , [facility] 07/25/2016, 8:33 AM Pager: [phone] Consults: [phone] Mon-Fri 7:00 am-4:30 pm Sat-Sun 7:00 am-11:30 am

## 2016-07-26 NOTE — Addendum Note (Signed)
Encounter addended by: Ripley Fraise, MD on: 07/26/2016 11:07 PM<BR>    Actions taken: Cosign clinical note with attestation, Document edited

## 2016-07-30 ENCOUNTER — Other Ambulatory Visit: Payer: Self-pay | Admitting: *Deleted

## 2016-07-30 NOTE — Patient Outreach (Addendum)
[place] [facility]) Care Management  07/30/2016  Crystal Madden [birth year] [id number]   Subjective: Telephone call to patient's home number, spoke with patient, and HIPAA verified.   Discussed [facility] Care Management UMR Transition of care follow up, voiced understanding, and is in agreement to complete follow up.   States she is doing well, is currently at daycare to pick up child, and request call back on 07/31/16.     Objective: Per chart review: Patient hospitalized 07/21/16 - 07/25/16 for Diverticulitis of sigmoid colon.     Assessment: Received UMR Transition of care referral on 07/23/16.   Transition of care pending patient contact.    Plan: RNCM will call patient for 2nd telephone outreach attempt, transition of care follow up, within 10 business days.   Jomarie Gellis H. Annia Friendly, BSN, [place] Management [facility] Telephonic CM Phone: [phone] Fax: [phone]

## 2016-07-31 ENCOUNTER — Other Ambulatory Visit: Payer: Self-pay | Admitting: *Deleted

## 2016-07-31 NOTE — Patient Outreach (Addendum)
[place] [facility]) Care Management  07/31/2016  SHELBE AMBORN 1976/04/21 [id number]   Subjective: Telephone call to patient's home number, no answer, left HIPAA compliant voicemail message, and requested call back.   Objective: Per chart review: Patient hospitalized 07/21/16 - 07/25/16 for Diverticulitis of sigmoid colon.     Assessment: Received UMR Transition of care referral on 07/23/16.   Transition of care pending patient contact.    Plan: RNCM will call patient for 3rd telephone outreach attempt, transition of care follow up, within 10 business days.   Larcenia Holaday H. Annia Friendly, BSN, [place] Management [facility] Telephonic CM Phone: [phone] Fax: [phone]

## 2016-08-01 MED FILL — NUVARING VAGINAL RING: 0.12-0.015 | 84 days supply | Qty: 3 | Fill #0

## 2016-08-05 DIAGNOSIS — K5720 Diverticulitis of large intestine with perforation and abscess without bleeding: Secondary | ICD-10-CM | POA: Diagnosis not present

## 2016-08-11 HISTORY — PX: LUMBAR MICRODISCECTOMY: SHX99

## 2016-08-12 ENCOUNTER — Encounter: Payer: Self-pay | Admitting: *Deleted

## 2016-08-12 ENCOUNTER — Other Ambulatory Visit: Payer: Self-pay | Admitting: *Deleted

## 2016-08-12 NOTE — Patient Outreach (Addendum)
[place] [facility]) Care Management  08/12/2016  Crystal Madden May 27, 1976 [id number]   [place] call to patient's home number, no answer, left HIPAA compliant voicemail message, and requested call back.   Objective: Per chart review: Patient hospitalized 07/21/16 - 07/25/16 for Diverticulitis of sigmoid colon.   Assessment:Received UMR Transition of care referral on 07/23/16. Transition of care pending patient contact.    Plan:RNCM will send patient unsuccessful outreach letter, [facility] pamphlet, and proceed with case closure within 10 business days, if no return call.   Annalicia Renfrew H. Annia Friendly, BSN, [place] Management [facility] Telephonic CM Phone: [phone] Fax: [phone]

## 2016-08-14 ENCOUNTER — Ambulatory Visit (INDEPENDENT_AMBULATORY_CARE_PROVIDER_SITE_OTHER): Payer: Self-pay | Admitting: Nurse Practitioner

## 2016-08-14 ENCOUNTER — Encounter: Payer: Self-pay | Admitting: Nurse Practitioner

## 2016-08-14 VITALS — BP 138/76 | HR 75 | Temp 98.1°F | Resp 20 | Ht 66.0 in | Wt 213.0 lb

## 2016-08-14 DIAGNOSIS — S39012A Strain of muscle, fascia and tendon of lower back, initial encounter: Secondary | ICD-10-CM

## 2016-08-14 MED ORDER — TRAMADOL HCL 50 MG PO TABS: 50.0000 mg | ORAL_TABLET | Freq: Three times a day (TID) | ORAL | 0 refills | Status: AC | PRN

## 2016-08-14 MED ORDER — CYCLOBENZAPRINE HCL 10 MG PO TABS: 10.0000 mg | ORAL_TABLET | Freq: Three times a day (TID) | ORAL | 0 refills | Status: AC | PRN

## 2016-08-14 MED FILL — traMADol HCL 50 MG TABS: 50 | 5 days supply | Qty: 15 | Fill #0

## 2016-08-14 MED FILL — CYCLOBENZAPRINE 10 MG TAB: 10 | 10 days supply | Qty: 30 | Fill #0

## 2016-08-14 NOTE — Progress Notes (Signed)
   Subjective:    Patient ID: Crystal Madden, female    DOB: 12-12-1975, 41 y.o.   MRN: [id number]  The patient is a 41 y.o. That was bending over 2 days and felt a pull in her back while doing laundry.  The patient went to the chiropractor yesterday, but at the end of the day her pain worsened.  The patient describes the pain as "sore" and feels like her back is going to give out with walking.  The patient rates the pain 8/10 at present.  The patient recently recovered from diverticulum surgery and states NSAIDS cannot be used for pain management and Tylenol does not help her.  The patient patient denies fever, chills, numbness/tingling in UE or LE.  The patient has point tenderness in her left lower back, denies radiation.  The patient states she had back pain previously and was able to find relief with stretching and chiropractic intervention.  The patient states the weight of her purse/laptop increases her pain.  Patient used heat and stretching with minimal relief.        Review of Systems  Constitutional: Positive for activity change (due to low back pain). Negative for fever.  Respiratory: Negative.   Cardiovascular: Negative.   Musculoskeletal: Positive for back pain (left low back pain, point tenderness with palpation). Negative for gait problem, neck pain and neck stiffness.       Objective:   Physical Exam  Constitutional: She is oriented to person, place, and time. She appears well-developed and well-nourished. She appears distressed (due to low back pain).  HENT:  Head: Normocephalic and atraumatic.  Eyes: Conjunctivae and EOM are normal. Pupils are equal, round, and reactive to light.  Neck: Normal range of motion. Neck supple.  Cardiovascular: Normal rate, regular rhythm and normal heart sounds.   Pulmonary/Chest: Effort normal and breath sounds normal.  Abdominal: Soft. Bowel sounds are normal.  Musculoskeletal: She exhibits tenderness (point tenderness to left lower  back superior to sacrum). She exhibits no deformity.  FROM to cervical spine, thoracic spine.  Decreased ROM to left side bending at the waist.  Able to bend forward with no discomfort.  Neurological: She is alert and oriented to person, place, and time. No cranial nerve deficit.  +2 DP/PT pulses.  Denies numbness or tingling.  Skin: Skin is warm and dry.  Psychiatric: She has a normal mood and affect. Her behavior is normal. Judgment and thought content normal.          Assessment & Plan:  Low Back Strain.  Patient given education for low back strain.  Patient given prescription for Tramadol and Flexeril.  Patient to use ice/heat as instructed.  Patient to also use OTC lidocaine patch to affected area.  Patient instructed to go to ER if numbness/tingling, loss of bowel/bladder function or increasing pain.  Patient to f/u with PCP. Patient verbalized understanding.

## 2016-08-14 NOTE — Patient Instructions (Addendum)
Low Back Strain A strain is a stretch or tear in a muscle or the strong cords of tissue that attach muscle to bone (tendons). Strains of the lower back (lumbar spine) are a common cause of low back pain. A strain occurs when muscles or tendons are torn or are stretched beyond their limits. The muscles may become inflamed, resulting in pain and sudden muscle tightening (spasms). A strain can happen suddenly due to an injury (trauma), or it can develop gradually due to overuse. There are three types of strains:  Grade 1 is a mild strain involving a minor tear of the muscle fibers or tendons. This may cause some pain but no loss of muscle strength.  Grade 2 is a moderate strain involving a partial tear of the muscle fibers or tendons. This causes more severe pain and some loss of muscle strength.  Grade 3 is a severe strain involving a complete tear of the muscle or tendon. This causes severe pain and complete or nearly complete loss of muscle strength. What are the causes? This condition may be caused by:  Trauma, such as a fall or a hit to the body.  Twisting or overstretching the back. This may result from doing activities that require a lot of energy, such as lifting heavy objects. What increases the risk? The following factors may increase your risk of getting this condition:  Playing contact sports.  Participating in sports or activities that put excessive stress on the back and require a lot of bending and twisting, including:  Lifting weights or heavy objects.  Gymnastics.  Soccer.  Figure skating.  Snowboarding.  Being overweight or obese.  Having poor strength and flexibility. What are the signs or symptoms? Symptoms of this condition may include:  Sharp or dull pain in the lower back that does not go away. Pain may extend to the buttocks.  Stiffness.  Limited range of motion.  Inability to stand up straight due to stiffness or pain.  Muscle spasms. How is this  diagnosed?   This condition may be diagnosed based on:  Your symptoms.  Your medical history.  A physical exam.  Your health care provider may push on certain areas of your back to determine the source of your pain.  You may be asked to bend forward, backward, and side to side to assess the severity of your pain and your range of motion.  Imaging tests, such as:  X-rays.  MRI. How is this treated? Treatment for this condition may include:  Applying heat and cold to the affected area.  Medicines to help relieve pain and to relax your muscles (muscle relaxants).  Use Tylenol as directed for pain.  Use Tramadol for severe pain as directed.  Physical therapy.  Use ice up to 4 times daily for 48 hours then switch to heat.  Use OTC lidocaine patch to affected area. When your symptoms improve, it is important to gradually return to your normal routine as soon as possible to reduce pain, avoid stiffness, and avoid loss of muscle strength. Generally, symptoms should improve within 6 weeks of treatment. However, recovery time varies. Follow these instructions at home: Managing pain, stiffness, and swelling  If directed, apply ice to the injured area during the first 24 hours after your injury.  Put ice in a plastic bag.  Place a towel between your skin and the bag.  Leave the ice on for 20 minutes, 2-3 times a day.  If directed, apply heat to the affected  area as often as told by your health care provider. Use the heat source that your health care provider recommends, such as a moist heat pack or a heating pad.  Place a towel between your skin and the heat source.  Leave the heat on for 20-30 minutes.  Remove the heat if your skin turns bright red. This is especially important if you are unable to feel pain, heat, or cold. You may have a greater risk of getting burned. Activity  Rest and return to your normal activities as told by your health care provider. Ask your health  care provider what activities are safe for you.  Avoid activities that take a lot of effort (are strenuous) for as long as told by your health care provider.  Do exercises as told by your health care provider. General instructions  Take over-the-counter and prescription medicines only as told by your health care provider.  If you have questions or concerns about safety while taking pain medicine, talk with your health care provider.  Do not drive or operate heavy machinery until you know how your pain medicine affects you.  Do not use any tobacco products, such as cigarettes, chewing tobacco, and e-cigarettes. Tobacco can delay bone healing. If you need help quitting, ask your health care provider.  Keep all follow-up visits as told by your health care provider. This is important. How is this prevented?  Warm up and stretch before being active.  Cool down and stretch after being active.  Give your body time to rest between periods of activity.  Avoid:  Being physically inactive for long periods at a time.  Exercising or playing sports when you are tired or in pain.  Use correct form when playing sports and lifting heavy objects.  Use good posture when sitting and standing.  Maintain a healthy weight.  Sleep on a mattress with medium firmness to support your back.  Make sure to use equipment that fits you, including shoes that fit well.  Be safe and responsible while being active to avoid falls.  Do at least 150 minutes of moderate-intensity exercise each week, such as brisk walking or water aerobics. Try a form of exercise that takes stress off your back, such as swimming or stationary cycling.  Maintain physical fitness, including:  Strength.  Flexibility.  Cardiovascular fitness.  Endurance. Contact a health care provider if:  Your back pain does not improve after 6 weeks of treatment.  Your symptoms get worse. Get help right away if:  Your back pain is  severe.  You are unable to stand or walk.  You develop pain in your legs.  You develop weakness in your buttocks or legs.  You have difficulty controlling when you urinate or when you have a bowel movement. This information is not intended to replace advice given to you by your health care provider. Make sure you discuss any questions you have with your health care provider. Document Released: 07/28/2005 Document Revised: 04/03/2016 Document Reviewed: 05/09/2015 Elsevier Interactive Patient Education  2017 [employer].  Back Exercises The following exercises strengthen the muscles that help to support the back. They also help to keep the lower back flexible. Doing these exercises can help to prevent back pain or lessen existing pain. If you have back pain or discomfort, try doing these exercises 2-3 times each day or as told by your health care provider. When the pain goes away, do them once each day, but increase the number of times that you  repeat the steps for each exercise (do more repetitions). If you do not have back pain or discomfort, do these exercises once each day or as told by your health care provider. Exercises Single Knee to Chest  Repeat these steps 3-5 times for each leg: 1. Lie on your back on a firm bed or the floor with your legs extended. 2. Bring one knee to your chest. Your other leg should stay extended and in contact with the floor. 3. Hold your knee in place by grabbing your knee or thigh. 4. Pull on your knee until you feel a gentle stretch in your lower back. 5. Hold the stretch for 10-30 seconds. 6. Slowly release and straighten your leg. Pelvic Tilt  Repeat these steps 5-10 times: 1. Lie on your back on a firm bed or the floor with your legs extended. 2. Bend your knees so they are pointing toward the ceiling and your feet are flat on the floor. 3. Tighten your lower abdominal muscles to press your lower back against the floor. This motion will tilt your  pelvis so your tailbone points up toward the ceiling instead of pointing to your feet or the floor. 4. With gentle tension and even breathing, hold this position for 5-10 seconds. Cat-Cow  Repeat these steps until your lower back becomes more flexible: 1. Get into a hands-and-knees position on a firm surface. Keep your hands under your shoulders, and keep your knees under your hips. You may place padding under your knees for comfort. 2. Let your head hang down, and point your tailbone toward the floor so your lower back becomes rounded like the back of a cat. 3. Hold this position for 5 seconds. 4. Slowly lift your head and point your tailbone up toward the ceiling so your back forms a sagging arch like the back of a cow. 5. Hold this position for 5 seconds. Press-Ups  Repeat these steps 5-10 times: 1. Lie on your abdomen (face-down) on the floor. 2. Place your palms near your head, about shoulder-width apart. 3. While you keep your back as relaxed as possible and keep your hips on the floor, slowly straighten your arms to raise the top half of your body and lift your shoulders. Do not use your back muscles to raise your upper torso. You may adjust the placement of your hands to make yourself more comfortable. 4. Hold this position for 5 seconds while you keep your back relaxed. 5. Slowly return to lying flat on the floor. Bridges  Repeat these steps 10 times: 1. Lie on your back on a firm surface. 2. Bend your knees so they are pointing toward the ceiling and your feet are flat on the floor. 3. Tighten your buttocks muscles and lift your buttocks off of the floor until your waist is at almost the same height as your knees. You should feel the muscles working in your buttocks and the back of your thighs. If you do not feel these muscles, slide your feet 1-2 inches farther away from your buttocks. 4. Hold this position for 3-5 seconds. 5. Slowly lower your hips to the starting position, and  allow your buttocks muscles to relax completely. If this exercise is too easy, try doing it with your arms crossed over your chest. Abdominal Crunches  Repeat these steps 5-10 times: 1. Lie on your back on a firm bed or the floor with your legs extended. 2. Bend your knees so they are pointing toward the ceiling and your  feet are flat on the floor. 3. Cross your arms over your chest. 4. Tip your chin slightly toward your chest without bending your neck. 5. Tighten your abdominal muscles and slowly raise your trunk (torso) high enough to lift your shoulder blades a tiny bit off of the floor. Avoid raising your torso higher than that, because it can put too much stress on your low back and it does not help to strengthen your abdominal muscles. 6. Slowly return to your starting position. Back Lifts  Repeat these steps 5-10 times: 1. Lie on your abdomen (face-down) with your arms at your sides, and rest your forehead on the floor. 2. Tighten the muscles in your legs and your buttocks. 3. Slowly lift your chest off of the floor while you keep your hips pressed to the floor. Keep the back of your head in line with the curve in your back. Your eyes should be looking at the floor. 4. Hold this position for 3-5 seconds. 5. Slowly return to your starting position. Contact a health care provider if:  Your back pain or discomfort gets much worse when you do an exercise.  Your back pain or discomfort does not lessen within 2 hours after you exercise. If you have any of these problems, stop doing these exercises right away. Do not do them again unless your health care provider says that you can. Get help right away if:  You develop sudden, severe back pain. If this happens, stop doing the exercises right away. Do not do them again unless your health care provider says that you can. This information is not intended to replace advice given to you by your health care provider. Make sure you discuss any  questions you have with your health care provider. Document Released: 09/04/2004 Document Revised: 12/05/2015 Document Reviewed: 09/21/2014 Elsevier Interactive Patient Education  2017 [employer].

## 2016-08-15 DIAGNOSIS — Z01419 Encounter for gynecological examination (general) (routine) without abnormal findings: Secondary | ICD-10-CM | POA: Diagnosis not present

## 2016-08-15 DIAGNOSIS — Z3044 Encounter for surveillance of vaginal ring hormonal contraceptive device: Secondary | ICD-10-CM | POA: Diagnosis not present

## 2016-08-26 ENCOUNTER — Other Ambulatory Visit: Payer: Self-pay | Admitting: *Deleted

## 2016-08-26 DIAGNOSIS — K5732 Diverticulitis of large intestine without perforation or abscess without bleeding: Secondary | ICD-10-CM | POA: Diagnosis not present

## 2016-08-26 NOTE — Patient Outreach (Signed)
[facility]) Care Management  08/26/2016  Crystal Madden 08/20/1975 [id number]   No response to patient outreach attempts, will proceed with case closure.  Objective: Per chart review: Patient hospitalized 07/21/16 - 07/25/16 for Diverticulitis of sigmoid colon.   Assessment:Received [facility] Transition of care referral on 07/23/16. Transition of care not completed due to patient unable to reach and will proceed with case closure. .    Plan:RNCM will send case closure request due to patient unable to reach request to [facility] [place] [facility].    Kynadi Dragos H. Annia Friendly, BSN, [facility] Telephonic CM Phone: [phone] Fax: [phone]

## 2016-09-01 ENCOUNTER — Other Ambulatory Visit: Payer: Self-pay | Admitting: Nurse Practitioner

## 2016-09-03 DIAGNOSIS — K921 Melena: Secondary | ICD-10-CM | POA: Diagnosis not present

## 2016-09-03 DIAGNOSIS — Z8719 Personal history of other diseases of the digestive system: Secondary | ICD-10-CM | POA: Diagnosis not present

## 2016-09-03 DIAGNOSIS — M25552 Pain in left hip: Secondary | ICD-10-CM | POA: Diagnosis not present

## 2016-09-03 MED FILL — traMADol HCL 50 MG TABS: 50 | 30 days supply | Qty: 30 | Fill #0

## 2016-09-05 DIAGNOSIS — M545 Low back pain: Secondary | ICD-10-CM | POA: Diagnosis not present

## 2016-09-05 DIAGNOSIS — M542 Cervicalgia: Secondary | ICD-10-CM | POA: Diagnosis not present

## 2016-09-05 DIAGNOSIS — M25552 Pain in left hip: Secondary | ICD-10-CM | POA: Diagnosis not present

## 2016-09-10 DIAGNOSIS — M542 Cervicalgia: Secondary | ICD-10-CM | POA: Diagnosis not present

## 2016-09-10 DIAGNOSIS — M545 Low back pain: Secondary | ICD-10-CM | POA: Diagnosis not present

## 2016-09-10 DIAGNOSIS — M25552 Pain in left hip: Secondary | ICD-10-CM | POA: Diagnosis not present

## 2016-09-16 DIAGNOSIS — M545 Low back pain: Secondary | ICD-10-CM | POA: Diagnosis not present

## 2016-09-16 DIAGNOSIS — M542 Cervicalgia: Secondary | ICD-10-CM | POA: Diagnosis not present

## 2016-09-16 DIAGNOSIS — M25552 Pain in left hip: Secondary | ICD-10-CM | POA: Diagnosis not present

## 2016-09-22 DIAGNOSIS — M545 Low back pain: Secondary | ICD-10-CM | POA: Diagnosis not present

## 2016-09-22 DIAGNOSIS — M25552 Pain in left hip: Secondary | ICD-10-CM | POA: Diagnosis not present

## 2016-09-22 DIAGNOSIS — M542 Cervicalgia: Secondary | ICD-10-CM | POA: Diagnosis not present

## 2016-09-25 ENCOUNTER — Other Ambulatory Visit: Payer: Self-pay | Admitting: General Surgery

## 2016-09-25 DIAGNOSIS — K5732 Diverticulitis of large intestine without perforation or abscess without bleeding: Secondary | ICD-10-CM

## 2016-09-25 MED FILL — AMOX TR-K CLV 875-125 MG TA: 875-125 | 10 days supply | Qty: 20 | Fill #0

## 2016-09-29 ENCOUNTER — Inpatient Hospital Stay: Admission: RE | Admit: 2016-09-29 | Payer: 59 | Source: Ambulatory Visit

## 2016-09-29 DIAGNOSIS — M542 Cervicalgia: Secondary | ICD-10-CM | POA: Diagnosis not present

## 2016-09-29 DIAGNOSIS — M25552 Pain in left hip: Secondary | ICD-10-CM | POA: Diagnosis not present

## 2016-09-29 DIAGNOSIS — M545 Low back pain: Secondary | ICD-10-CM | POA: Diagnosis not present

## 2016-09-29 MED FILL — METHYLPREDNISOLONE 4 MG TAB: 4 | 6 days supply | Qty: 21 | Fill #0

## 2016-09-30 DIAGNOSIS — M545 Low back pain: Secondary | ICD-10-CM | POA: Diagnosis not present

## 2016-10-04 DIAGNOSIS — M545 Low back pain: Secondary | ICD-10-CM | POA: Diagnosis not present

## 2016-10-06 DIAGNOSIS — M5126 Other intervertebral disc displacement, lumbar region: Secondary | ICD-10-CM | POA: Diagnosis not present

## 2016-10-06 DIAGNOSIS — M542 Cervicalgia: Secondary | ICD-10-CM | POA: Diagnosis not present

## 2016-10-06 DIAGNOSIS — M545 Low back pain: Secondary | ICD-10-CM | POA: Diagnosis not present

## 2016-10-06 DIAGNOSIS — M25552 Pain in left hip: Secondary | ICD-10-CM | POA: Diagnosis not present

## 2016-10-06 DIAGNOSIS — M5416 Radiculopathy, lumbar region: Secondary | ICD-10-CM | POA: Diagnosis not present

## 2016-10-09 DIAGNOSIS — Z01812 Encounter for preprocedural laboratory examination: Secondary | ICD-10-CM | POA: Diagnosis not present

## 2016-10-09 DIAGNOSIS — M5416 Radiculopathy, lumbar region: Secondary | ICD-10-CM | POA: Diagnosis not present

## 2016-10-13 MED FILL — NUVARING VAGINAL RING: 0.12-0.015 | 84 days supply | Qty: 3 | Fill #0

## 2016-10-17 DIAGNOSIS — M5416 Radiculopathy, lumbar region: Secondary | ICD-10-CM | POA: Diagnosis not present

## 2016-10-17 DIAGNOSIS — M5126 Other intervertebral disc displacement, lumbar region: Secondary | ICD-10-CM | POA: Diagnosis not present

## 2016-10-17 DIAGNOSIS — M47816 Spondylosis without myelopathy or radiculopathy, lumbar region: Secondary | ICD-10-CM | POA: Diagnosis not present

## 2016-10-17 DIAGNOSIS — M4726 Other spondylosis with radiculopathy, lumbar region: Secondary | ICD-10-CM | POA: Diagnosis not present

## 2016-10-17 DIAGNOSIS — M5116 Intervertebral disc disorders with radiculopathy, lumbar region: Secondary | ICD-10-CM | POA: Diagnosis not present

## 2016-10-28 MED FILL — OXYCODONE W/APAP 5/325 TAB: 5-325 | 10 days supply | Qty: 60 | Fill #0

## 2016-11-10 MED FILL — OXYCODONE W/APAP 5/325 TAB: 5-325 | 7 days supply | Qty: 42 | Fill #0

## 2016-11-13 MED FILL — METHYLPREDNISOLONE 4 MG TAB: 4 | 6 days supply | Qty: 21 | Fill #0

## 2016-11-21 DIAGNOSIS — M5126 Other intervertebral disc displacement, lumbar region: Secondary | ICD-10-CM | POA: Diagnosis not present

## 2016-11-21 DIAGNOSIS — M5416 Radiculopathy, lumbar region: Secondary | ICD-10-CM | POA: Diagnosis not present

## 2016-11-24 MED FILL — OXYCODONE W/APAP 5/325 TAB: 5-325 | 7 days supply | Qty: 42 | Fill #0

## 2016-11-24 MED FILL — ALPRAZolam 0.5 MG TABS: 0.5 | 15 days supply | Qty: 30 | Fill #1

## 2016-12-08 DIAGNOSIS — M6281 Muscle weakness (generalized): Secondary | ICD-10-CM | POA: Diagnosis not present

## 2016-12-08 DIAGNOSIS — M5416 Radiculopathy, lumbar region: Secondary | ICD-10-CM | POA: Diagnosis not present

## 2016-12-08 DIAGNOSIS — R262 Difficulty in walking, not elsewhere classified: Secondary | ICD-10-CM | POA: Diagnosis not present

## 2016-12-08 DIAGNOSIS — M545 Low back pain: Secondary | ICD-10-CM | POA: Diagnosis not present

## 2016-12-09 ENCOUNTER — Telehealth: Payer: 59 | Admitting: Family

## 2016-12-09 DIAGNOSIS — J0100 Acute maxillary sinusitis, unspecified: Secondary | ICD-10-CM | POA: Diagnosis not present

## 2016-12-09 MED ORDER — AMOXICILLIN-POT CLAVULANATE 875-125 MG PO TABS: 1.0000 | ORAL_TABLET | Freq: Two times a day (BID) | ORAL | 0 refills | Status: DC

## 2016-12-09 MED FILL — AMOX-CLAV 875-125 MG TABLET: 875-125 | 7 days supply | Qty: 14 | Fill #0

## 2016-12-09 NOTE — Progress Notes (Signed)

## 2016-12-12 MED FILL — OXYCODONE W/APAP 5/325 TAB: 5-325 | 7 days supply | Qty: 42 | Fill #0

## 2016-12-16 DIAGNOSIS — M6281 Muscle weakness (generalized): Secondary | ICD-10-CM | POA: Diagnosis not present

## 2016-12-16 DIAGNOSIS — R262 Difficulty in walking, not elsewhere classified: Secondary | ICD-10-CM | POA: Diagnosis not present

## 2016-12-16 DIAGNOSIS — M545 Low back pain: Secondary | ICD-10-CM | POA: Diagnosis not present

## 2016-12-16 DIAGNOSIS — M5416 Radiculopathy, lumbar region: Secondary | ICD-10-CM | POA: Diagnosis not present

## 2016-12-22 DIAGNOSIS — R262 Difficulty in walking, not elsewhere classified: Secondary | ICD-10-CM | POA: Diagnosis not present

## 2016-12-22 DIAGNOSIS — M545 Low back pain: Secondary | ICD-10-CM | POA: Diagnosis not present

## 2016-12-22 DIAGNOSIS — M5416 Radiculopathy, lumbar region: Secondary | ICD-10-CM | POA: Diagnosis not present

## 2016-12-22 DIAGNOSIS — M6281 Muscle weakness (generalized): Secondary | ICD-10-CM | POA: Diagnosis not present

## 2017-01-02 DIAGNOSIS — R635 Abnormal weight gain: Secondary | ICD-10-CM | POA: Diagnosis not present

## 2017-01-02 DIAGNOSIS — Z6834 Body mass index (BMI) 34.0-34.9, adult: Secondary | ICD-10-CM | POA: Diagnosis not present

## 2017-01-07 MED FILL — NUVARING VAGINAL RING: 0.12-0.015 | 84 days supply | Qty: 3 | Fill #1

## 2017-01-19 DIAGNOSIS — R262 Difficulty in walking, not elsewhere classified: Secondary | ICD-10-CM | POA: Diagnosis not present

## 2017-01-19 DIAGNOSIS — M545 Low back pain: Secondary | ICD-10-CM | POA: Diagnosis not present

## 2017-01-19 DIAGNOSIS — M6281 Muscle weakness (generalized): Secondary | ICD-10-CM | POA: Diagnosis not present

## 2017-01-19 DIAGNOSIS — M5416 Radiculopathy, lumbar region: Secondary | ICD-10-CM | POA: Diagnosis not present

## 2017-03-06 MED FILL — PHENTERMINE 37.5 MG TABLET: 37.5 | 30 days supply | Qty: 30 | Fill #0

## 2017-03-30 MED FILL — NUVARING VAGINAL RING: 0.12-0.015 | 84 days supply | Qty: 3 | Fill #2

## 2017-03-30 MED FILL — PHENTERMINE 37.5 MG TABLET: 37.5 | 30 days supply | Qty: 30 | Fill #1

## 2017-04-27 MED FILL — PHENTERMINE 37.5 MG TABLET: 37.5 | 30 days supply | Qty: 30 | Fill #0

## 2017-05-26 MED FILL — PHENTERMINE 37.5 MG TABLET: 37.5 | 30 days supply | Qty: 30 | Fill #1

## 2017-06-25 MED FILL — PHENTERMINE 37.5 MG TABLET: 37.5 | 30 days supply | Qty: 30 | Fill #0

## 2017-07-06 MED FILL — NUVARING VAGINAL RING: 0.12-0.015 | 84 days supply | Qty: 3 | Fill #3

## 2017-09-08 MED FILL — NUVARING VAGINAL RING: 0.12-0.015 | 84 days supply | Qty: 3 | Fill #0

## 2017-10-07 ENCOUNTER — Telehealth: Payer: 59 | Admitting: Family

## 2017-10-07 ENCOUNTER — Encounter: Payer: Self-pay | Admitting: Emergency Medicine

## 2017-10-07 ENCOUNTER — Ambulatory Visit (INDEPENDENT_AMBULATORY_CARE_PROVIDER_SITE_OTHER): Payer: Self-pay | Admitting: Emergency Medicine

## 2017-10-07 VITALS — BP 126/90 | HR 87 | Temp 98.4°F | Resp 16 | Wt 214.0 lb

## 2017-10-07 DIAGNOSIS — G8929 Other chronic pain: Secondary | ICD-10-CM

## 2017-10-07 DIAGNOSIS — M5440 Lumbago with sciatica, unspecified side: Principal | ICD-10-CM

## 2017-10-07 DIAGNOSIS — M545 Low back pain, unspecified: Secondary | ICD-10-CM

## 2017-10-07 MED ORDER — METHOCARBAMOL 500 MG PO TABS: 500.0000 mg | ORAL_TABLET | Freq: Four times a day (QID) | ORAL | 0 refills | Status: DC

## 2017-10-07 MED ORDER — PREDNISONE 10 MG PO TABS: ORAL_TABLET | ORAL | 0 refills | Status: DC

## 2017-10-07 MED FILL — predniSONE 10 MG TABS: 10 | 12 days supply | Qty: 42 | Fill #0

## 2017-10-07 MED FILL — METHOCARBAMOL 500 MG TABS: 500 | 10 days supply | Qty: 40 | Fill #0

## 2017-10-07 NOTE — Progress Notes (Signed)
Subjective:    Crystal Madden is a 42 y.o. female who presents for evaluation of low back pain. The patient has had ruptured disks requiring MRI and surgery, which had previously resolved her pain. Symptoms have been present for 2 days and are unchanged.  Onset was related to / precipitated by lifting a heavy object. The pain is located in the midline of the lumbar level and does not radiate. The pain is described as aching, sharp, stabbing and throbbing and occurs intermittently. She rates her pain as mild. Symptoms are exacerbated by extension, standing and twisting. Symptoms are improved by change in body position and NSAIDs.. She has no other symptoms associated with the back pain. The patient has no "red flag" history indicative of complicated back pain.   Review of Systems Pertinent items noted in HPI and remainder of comprehensive ROS otherwise negative.    Objective:   NAD, pt alert, dressed appropriately for weather, Full range of motion without pain, no tenderness, no spasm, no curvature. Normal reflexes, gait, strength and negative straight-leg raise.    Vitals:   10/07/17 1155  BP: 126/90  Pulse: 87  Resp: 16  Temp: 98.4 F (36.9 C)  SpO2: 98%     Assessment:    Nonspecific acute low back pain    Plan:    Natural history and expected course discussed. Questions answered. Neurosurgeon distributed. Proper lifting, bending technique discussed. Stretching exercises discussed. Heat to affected area as needed for local pain relief. OTC analgesics as needed. Muscle relaxants per medication orders. Follow-up in 2 weeks. Prednisone for inflamation

## 2017-10-07 NOTE — Patient Instructions (Signed)

## 2017-10-07 NOTE — Progress Notes (Signed)
Based on what you shared with me it looks like you have a serious condition that should be evaluated in a face to face office visit.  NOTE: If you entered your credit card information for this eVisit, you will not be charged. You may see a "hold" on your card for the $30 but that hold will drop off and you will not have a charge processed.  If you are having a true medical emergency please call 911.  If you need an urgent face to face visit, [facility] has four urgent care centers for your convenience.  If you need care fast and have a high deductible or no insurance consider:   [email or web address] to reserve your spot online an avoid wait times  [facility] [address], [address] [place], [place] [address] 8 am to 8 pm Monday-Friday 10 am to 4 pm Saturday-Sunday *Across the street from [employer]  [facility]  [address] [place], [address] 8 am to 5 pm Monday-Friday * In the [facility]   The following sites will take your  insurance:  . [facility]  [phone] Get Driving Directions Find a Provider at this Location  [address] , [place] [address] . 10 am to 8 pm Monday-Friday . 12 pm to 8 pm Saturday-Sunday   . [facility]  [phone] Get Driving Directions Find a Provider at this Location  [address], [address] [place], [place] [address] . 8 am to 8 pm Monday-Friday . 9 am to 6 pm Saturday . 11 am to 6 pm Sunday   . [facility] [place]  [phone] Get Driving Directions  [address] [place], [place] [address] . 8 am to 8 pm Monday-Friday . 8 am to 4 pm Saturday-Sunday   Your e-visit answers were reviewed by a board certified advanced clinical practitioner to complete your personal care plan.  Thank you for using e-Visits.  

## 2017-12-08 MED FILL — NUVARING VAGINAL RING: 0.12-0.015 | 84 days supply | Qty: 3 | Fill #1

## 2017-12-14 ENCOUNTER — Encounter: Payer: Self-pay | Admitting: Family Medicine

## 2017-12-14 ENCOUNTER — Ambulatory Visit (INDEPENDENT_AMBULATORY_CARE_PROVIDER_SITE_OTHER): Payer: No Typology Code available for payment source | Admitting: Family Medicine

## 2017-12-14 VITALS — BP 130/82 | HR 77 | Temp 98.6°F | Ht 66.0 in | Wt 222.2 lb

## 2017-12-14 DIAGNOSIS — R238 Other skin changes: Secondary | ICD-10-CM | POA: Diagnosis not present

## 2017-12-14 DIAGNOSIS — E559 Vitamin D deficiency, unspecified: Secondary | ICD-10-CM

## 2017-12-14 DIAGNOSIS — R5383 Other fatigue: Secondary | ICD-10-CM | POA: Diagnosis not present

## 2017-12-14 LAB — CBC WITH DIFFERENTIAL/PLATELET
Basophils Absolute: 0 10*3/uL (ref 0.0–0.1)
Basophils Relative: 0.6 % (ref 0.0–3.0)
Eosinophils Absolute: 0.1 10*3/uL (ref 0.0–0.7)
Eosinophils Relative: 1.4 % (ref 0.0–5.0)
HCT: 40.9 % (ref 36.0–46.0)
Hemoglobin: 13.9 g/dL (ref 12.0–15.0)
Lymphocytes Relative: 22.3 % (ref 12.0–46.0)
Lymphs Abs: 1.7 10*3/uL (ref 0.7–4.0)
MCHC: 34 g/dL (ref 30.0–36.0)
MCV: 92.1 fl (ref 78.0–100.0)
Monocytes Absolute: 0.4 10*3/uL (ref 0.1–1.0)
Monocytes Relative: 5.6 % (ref 3.0–12.0)
Neutro Abs: 5.4 10*3/uL (ref 1.4–7.7)
Neutrophils Relative %: 70.1 % (ref 43.0–77.0)
Platelets: 232 10*3/uL (ref 150.0–400.0)
RBC: 4.45 Mil/uL (ref 3.87–5.11)
RDW: 12.5 % (ref 11.5–15.5)
WBC: 7.8 10*3/uL (ref 4.0–10.5)

## 2017-12-14 LAB — COMPREHENSIVE METABOLIC PANEL
ALT: 15 U/L (ref 0–35)
AST: 17 U/L (ref 0–37)
Albumin: 4.1 g/dL (ref 3.5–5.2)
Alkaline Phosphatase: 41 U/L (ref 39–117)
BUN: 15 mg/dL (ref 6–23)
CO2: 25 mEq/L (ref 19–32)
Calcium: 9.7 mg/dL (ref 8.4–10.5)
Chloride: 106 mEq/L (ref 96–112)
Creatinine, Ser: 0.75 mg/dL (ref 0.40–1.20)
GFR: 91.03 mL/min (ref >60.00)
Glucose, Bld: 84 mg/dL (ref 70–99)
Potassium: 4 mEq/L (ref 3.5–5.1)
Sodium: 139 mEq/L (ref 135–145)
Total Bilirubin: 0.7 mg/dL (ref 0.2–1.2)
Total Protein: 7.1 g/dL (ref 6.0–8.3)

## 2017-12-14 LAB — HEMOGLOBIN A1C: Hgb A1c MFr Bld: 5 % (ref 4.6–6.5)

## 2017-12-14 LAB — TSH: TSH: 1.85 u[IU]/mL (ref 0.35–4.50)

## 2017-12-14 LAB — VITAMIN D 25 HYDROXY (VIT D DEFICIENCY, FRACTURES): VITD: 27.12 ng/mL — ABNORMAL LOW (ref 30.00–100.00)

## 2017-12-14 MED ORDER — PHENTERMINE HCL 37.5 MG PO TABS
37.5000 mg | ORAL_TABLET | Freq: Every day | ORAL | 0 refills | Status: DC
Start: 2017-12-14 — End: 2018-01-11

## 2017-12-14 MED ORDER — BIMATOPROST 0.03 % EX SOLN: 1.0000 [drp] | Freq: Every day | CUTANEOUS | 12 refills | Status: DC

## 2017-12-14 MED FILL — PHENTERMINE 37.5 MG TABLET: 37.5 | 30 days supply | Qty: 30 | Fill #0

## 2017-12-14 NOTE — Progress Notes (Signed)
Crystal Madden is a 42 y.o. female is here TO ESTABLISH CARE.  History of Present Illness:   Crystal Madden, CMA acting as scribe for Dr. Briscoe Deutscher.   HPI: Patient in office to establish care. She is a Furniture conservator/restorer. She would like referral to dermatology. She has fair skin and has not had evaluation in few years. She has a few spots that are concerning for her on her nose and forehead. She also would like to be evaluated for weight gain and fatigue.    Patient has struggled with weight for most of her life.  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  She does sleep 7 hours per night.  She describes hypersomnia by noon every day.  She admits that she has a poor diet.  She feels that she understands nutrition and has seen a dietitian in the past but has a component of emotional eating.  She has lost weight with weight watchers in the past and used phentermine.  She seems to do well with phentermine.  She has signed up for a gym but that gym has not opened yet. At highest weight.  This year, she has been hospitalized for a colon due to diverticulitis.  She also had microdiscectomy of L3-L4 and L4-L5.  She does some exercises through [employer].  Health Maintenance Due  Topic Date Due  . HIV Screening  01/30/1993  . TETANUS/TDAP  01/30/1997   Depression screen PHQ 2/9 12/14/2017 09/26/2014  Decreased Interest 0 0  Down, Depressed, Hopeless 0 0  PHQ - 2 Score 0 0   PMHx, SurgHx, SocialHx, FamHx, Medications, and Allergies were reviewed in the Visit Navigator and updated as appropriate.   Patient Active Problem List   Diagnosis Date Noted  . Diverticulitis of sigmoid colon 07/22/2016  . Abdominal pain, left lower quadrant 07/22/2016  . Leukocytosis 07/22/2016  . Hypokalemia 07/22/2016   Social History   Tobacco Use  . Smoking status: Never Smoker  . Smokeless tobacco: Never Used  Substance Use Topics  . Alcohol use: Yes    Comment: Ocassional.  .  Drug use: No   Current Medications and Allergies:   .  acetaminophen (TYLENOL) 325 MG tablet, Take 2 tablets (650 mg total) by mouth every 6 (six) hours as needed for mild pain (or Fever >/= 101)., Disp: , Rfl:  .  ALPRAZolam (XANAX) 0.5 MG tablet, Take 0.25-0.5 mg by mouth 2 (two) times daily as needed., Disp: , Rfl: 1 .  etonogestrel-ethinyl estradiol (NUVARING) 0.12-0.015 MG/24HR vaginal ring, Place 1 each vaginally every 28 (twenty-eight) days. Insert vaginally and leave in place for 3 consecutive weeks, then remove for 1 week., Disp: , Rfl:  .  methocarbamol (ROBAXIN) 500 MG tablet, Take 1 tablet (500 mg total) by mouth 4 (four) times daily., Disp: 40 tablet, Rfl: 0  No Known Allergies   Review of Systems   Pertinent items are noted in the HPI. Otherwise, ROS is negative.  Vitals:   Vitals:   12/14/17 1006  BP: 130/82  Pulse: 77  Temp: 98.6 F (37 C)  TempSrc: Oral  SpO2: 97%  Weight: 222 lb 3.2 oz (100.8 kg)  Height: 5\' 6"  (1.676 m)     Body mass index is 35.86 kg/m.   Physical Exam:   Physical Exam  Constitutional: She is oriented to person, place, and time. She appears well-developed and well-nourished. No distress.  HENT:  Head: Normocephalic and atraumatic.  Right  Ear: External ear normal.  Left Ear: External ear normal.  Nose: Nose normal.  Mouth/Throat: Oropharynx is clear and moist.  Eyes: Pupils are equal, round, and reactive to light. Conjunctivae and EOM are normal.  Neck: Normal range of motion. Neck supple. No thyromegaly present.  Cardiovascular: Normal rate, regular rhythm, normal heart sounds and intact distal pulses.  Pulmonary/Chest: Effort normal and breath sounds normal.  Abdominal: Soft. Bowel sounds are normal.  Musculoskeletal: Normal range of motion.  Lymphadenopathy:    She has no cervical adenopathy.  Neurological: She is alert and oriented to person, place, and time.  Skin: Skin is warm and dry. Capillary refill takes less than 2  seconds.  Psychiatric: She has a normal mood and affect. Her behavior is normal.  Nursing note and vitals reviewed.  Assessment and Plan:   Brynnleigh was seen today for establish care.  Diagnoses and all orders for this visit:  Vitamin D deficiency -     VITAMIN D 25 Hydroxy (Vit-D Deficiency, Fractures)  Other fatigue -     CBC with Differential/Platelet -     Comprehensive metabolic panel -     TSH -     Hemoglobin A1c  Other skin changes -     Ambulatory referral to Dermatology  Morbid obesity ([place]) -     Hemoglobin A1c -     phentermine (ADIPEX-P) 37.5 MG tablet; Take 1 tablet (37.5 mg total) by mouth daily before breakfast.  Other orders -     bimatoprost (LATISSE) 0.03 % ophthalmic solution; Place 1 drop into both eyes at bedtime. Place one drop on applicator and apply evenly along the skin of the upper eyelid at base of eyelashes once daily at bedtime; repeat procedure for second eye (use a clean applicator).    . Reviewed expectations re: course of current medical issues. . Discussed self-management of symptoms. . Outlined signs and symptoms indicating need for more acute intervention. . Patient verbalized understanding and all questions were answered. [facility] Health Maintenance issues including appropriate healthy diet, exercise, and smoking avoidance were discussed with patient. . See orders for this visit as documented in the electronic medical record. . Patient received an After Visit Summary.  Briscoe Deutscher, DO [place] 12/14/2017  Future Appointments  Date Time Provider [place]  01/11/2018  9:00 AM Briscoe Deutscher, DO [facility]   CMA served as scribe during this visit. History, Physical, and Plan performed by medical provider. The above documentation has been reviewed and is accurate and complete. Briscoe Deutscher, D.O.

## 2017-12-29 ENCOUNTER — Telehealth: Payer: No Typology Code available for payment source | Admitting: Family

## 2017-12-29 DIAGNOSIS — B354 Tinea corporis: Secondary | ICD-10-CM | POA: Diagnosis not present

## 2017-12-29 MED ORDER — NYSTATIN-TRIAMCINOLONE 100000-0.1 UNIT/GM-% EX OINT: 1.0000 "application " | TOPICAL_OINTMENT | Freq: Two times a day (BID) | CUTANEOUS | 0 refills | Status: DC

## 2017-12-29 MED FILL — NYSTATIN-TRIAMCINOLONE OINT: 100000-0.1 | 15 days supply | Qty: 30 | Fill #0

## 2017-12-29 NOTE — Progress Notes (Signed)
E Visit for Rash  We are sorry that you are not feeling well. Here is how we plan to help!  Based off of your presentation you appear to have a fungal infection (yeast) to the chest area. Yeast can be worse after showers, with sweating and moist. I have prescribed  Nystatin cream apply to the affected area twice daily   HOME CARE:   Take cool showers and avoid direct sunlight.  Apply cool compress or wet dressings.  Take a bath in an oatmeal bath.  Sprinkle content of one Aveeno packet under running faucet with comfortably warm water.  Bathe for 15-20 minutes, 1-2 times daily.  Pat dry with a towel. Do not rub the rash.  Use hydrocortisone cream.  Take an antihistamine like Benadryl for widespread rashes that itch.  The adult dose of Benadryl is 25-50 mg by mouth 4 times daily.  Caution:  This type of medication may cause sleepiness.  Do not drink alcohol, drive, or operate dangerous machinery while taking antihistamines.  Do not take these medications if you have prostate enlargement.  Read package instructions thoroughly on all medications that you take.  GET HELP RIGHT AWAY IF:   Symptoms don't go away after treatment.  Severe itching that persists.  If you rash spreads or swells.  If you rash begins to smell.  If it blisters and opens or develops a yellow-brown crust.  You develop a fever.  You have a sore throat.  You become short of breath.  MAKE SURE YOU:  Understand these instructions. Will watch your condition. Will get help right away if you are not doing well or get worse.  Thank you for choosing an e-visit. Your e-visit answers were reviewed by a board certified advanced clinical practitioner to complete your personal care plan. Depending upon the condition, your plan could have included both over the counter or prescription medications. Please review your pharmacy choice. Be sure that the pharmacy you have chosen is open so that you can pick up your  prescription now.  If there is a problem you may message your provider in [place] to have the prescription routed to another pharmacy. Your safety is important to [place]. If you have drug allergies check your prescription carefully.  For the next 24 hours, you can use MyChart to ask questions about today's visit, request a non-urgent call back, or ask for a work or school excuse from your e-visit provider. You will get an email in the next two days asking about your experience. I hope that your e-visit has been valuable and will speed your recovery.

## 2018-01-11 ENCOUNTER — Encounter: Payer: Self-pay | Admitting: Family Medicine

## 2018-01-11 ENCOUNTER — Ambulatory Visit (INDEPENDENT_AMBULATORY_CARE_PROVIDER_SITE_OTHER): Payer: No Typology Code available for payment source | Admitting: Family Medicine

## 2018-01-11 MED ORDER — PHENTERMINE HCL 37.5 MG PO TABS: 37.5000 mg | ORAL_TABLET | Freq: Every day | ORAL | 2 refills | Status: DC

## 2018-01-11 MED FILL — PHENTERMINE 37.5 MG TABLET: 37.5 | 30 days supply | Qty: 30 | Fill #0

## 2018-01-11 NOTE — Progress Notes (Signed)
Crystal Madden is a 42 y.o. female is here for follow up.  History of Present Illness:  Crystal Madden CMA acting as scribe for Dr. Juleen China.  HPI: Patient comes in today for 1 month follow up for her weight lost. She has lost 8 pounds since her last visit. She is tolerating medication well. No exercise yet. Making better food choices. No insomnia.  Health Maintenance Due  Topic Date Due  . HIV Screening  01/30/1993   Depression screen [facility] 2/9 12/14/2017 09/26/2014  Decreased Interest 0 0  Down, Depressed, Hopeless 0 0  PHQ - 2 Score 0 0   PMHx, SurgHx, SocialHx, FamHx, Medications, and Allergies were reviewed in the Visit Navigator and updated as appropriate.   Patient Active Problem List   Diagnosis Date Noted  . Diverticulitis of sigmoid colon 07/22/2016  . Abdominal pain, left lower quadrant 07/22/2016  . Leukocytosis 07/22/2016  . Hypokalemia 07/22/2016   Social History   Tobacco Use  . Smoking status: Never Smoker  . Smokeless tobacco: Never Used  Substance Use Topics  . Alcohol use: Yes    Comment: Ocassional.  . Drug use: No   Current Medications and Allergies:   Current Outpatient Medications:  .  acetaminophen (TYLENOL) 325 MG tablet, Take 2 tablets (650 mg total) by mouth every 6 (six) hours as needed for mild pain (or Fever >/= 101)., Disp: , Rfl:  .  ALPRAZolam (XANAX) 0.5 MG tablet, Take 0.25-0.5 mg by mouth 2 (two) times daily as needed., Disp: , Rfl: 1 .  bimatoprost (LATISSE) 0.03 % ophthalmic solution, Place 1 drop into both eyes at bedtime. Place one drop on applicator and apply evenly along the skin of the upper eyelid at base of eyelashes once daily at bedtime; repeat procedure for second eye (use a clean applicator)., Disp: 3 mL, Rfl: 12 .  etonogestrel-ethinyl estradiol (NUVARING) 0.12-0.015 MG/24HR vaginal ring, Place 1 each vaginally every 28 (twenty-eight) days. Insert vaginally and leave in place for 3 consecutive weeks, then remove for 1  week., Disp: , Rfl:  .  methocarbamol (ROBAXIN) 500 MG tablet, Take 1 tablet (500 mg total) by mouth 4 (four) times daily., Disp: 40 tablet, Rfl: 0 .  nystatin-triamcinolone ointment (MYCOLOG), Apply 1 application topically 2 (two) times daily., Disp: 30 g, Rfl: 0 .  phentermine (ADIPEX-P) 37.5 MG tablet, Take 1 tablet (37.5 mg total) by mouth daily before breakfast., Disp: 30 tablet, Rfl: 0  No Known Allergies   Review of Systems   Pertinent items are noted in the HPI. Otherwise, ROS is negative.  Vitals:   Vitals:   01/11/18 0854  BP: 124/82  Pulse: 70  Temp: 98.1 F (36.7 C)  TempSrc: Oral  SpO2: 99%  Weight: 214 lb 3.2 oz (97.2 kg)  Height: 5\' 6"  (1.676 m)     Body mass index is 34.57 kg/m.  Physical Exam:   Physical Exam  Constitutional: She is oriented to person, place, and time. She appears well-developed and well-nourished. No distress.  HENT:  Head: Normocephalic and atraumatic.  Right Ear: External ear normal.  Left Ear: External ear normal.  Nose: Nose normal.  Mouth/Throat: Oropharynx is clear and moist.  Eyes: Pupils are equal, round, and reactive to light. Conjunctivae and EOM are normal.  Neck: Normal range of motion. Neck supple. No thyromegaly present.  Cardiovascular: Normal rate, regular rhythm, normal heart sounds and intact distal pulses.  Pulmonary/Chest: Effort normal and breath sounds normal.  Abdominal: Soft. Bowel sounds are  normal.  Musculoskeletal: Normal range of motion.  Lymphadenopathy:    She has no cervical adenopathy.  Neurological: She is alert and oriented to person, place, and time.  Skin: Skin is warm and dry. Capillary refill takes less than 2 seconds.  Psychiatric: She has a normal mood and affect. Her behavior is normal.  Nursing note and vitals reviewed.   Assessment and Plan:   Diagnoses and all orders for this visit:  Morbid obesity ([place]) -     phentermine (ADIPEX-P) 37.5 MG tablet; Take 1 tablet (37.5 mg total) by  mouth daily before breakfast.   . Reviewed expectations re: course of current medical issues. . Discussed self-management of symptoms. . Outlined signs and symptoms indicating need for more acute intervention. . Patient verbalized understanding and all questions were answered. Marland Kitchen Health Maintenance issues including appropriate healthy diet, exercise, and smoking avoidance were discussed with patient. . See orders for this visit as documented in the electronic medical record. . Patient received an After Visit Summary.  Briscoe Deutscher, DO [place] [place] 01/11/2018  Future Appointments  Date Time Provider [place]  04/13/2018  8:40 AM Briscoe Deutscher, DO [facility]   CMA served as scribe during this visit. History, Physical, and Plan performed by medical provider. The above documentation has been reviewed and is accurate and complete. Briscoe Deutscher, D.O.

## 2018-02-08 MED FILL — PHENTERMINE 37.5 MG TABLET: 37.5 | 30 days supply | Qty: 30 | Fill #1

## 2018-03-03 MED FILL — NUVARING VAGINAL RING: 0.12-0.015 | 84 days supply | Qty: 3 | Fill #2

## 2018-03-09 MED FILL — PHENTERMINE 37.5 MG TABLET: 37.5 | 30 days supply | Qty: 30 | Fill #2

## 2018-04-13 ENCOUNTER — Ambulatory Visit: Payer: No Typology Code available for payment source | Admitting: Family Medicine

## 2018-04-20 ENCOUNTER — Ambulatory Visit: Payer: No Typology Code available for payment source | Admitting: Family Medicine

## 2018-04-21 ENCOUNTER — Other Ambulatory Visit: Payer: Self-pay | Admitting: Family Medicine

## 2018-04-21 NOTE — Telephone Encounter (Signed)
Copied from [place] [id number]. Topic: Quick Communication - Rx Refill/Question >> Apr 21, 2018  2:36 PM Bea Graff, NT wrote: Medication: phentermine (ADIPEX-P) 37.5 MG tablet   Has the patient contacted their pharmacy? Yes.   (Agent: If no, request that the patient contact the pharmacy for the refill.) (Agent: If yes, when and what did the pharmacy advise?)  Preferred Pharmacy (with phone number or street name): [place], [place] - [address] [phone] (Phone) [phone] (Fax)    Agent: Please be advised that RX refills may take up to 3 business days. We ask that you follow-up with your pharmacy.

## 2018-04-21 NOTE — Telephone Encounter (Signed)
Patient has appointment on 04/27/18. I explained to the patient that this is a medication that would need an appointment to have this refilled. Patient was fine with this.

## 2018-04-21 NOTE — Telephone Encounter (Signed)
Refill of Adipex-P  LOV 01/11/18 Dr. Juleen China  [facility] 01/11/18  #30  2 refills   [place], [place] - [address].     [phone] (Phone) [phone] (Fax)

## 2018-04-21 NOTE — Telephone Encounter (Signed)
See note

## 2018-04-27 ENCOUNTER — Ambulatory Visit: Payer: No Typology Code available for payment source | Admitting: Family Medicine

## 2018-05-11 ENCOUNTER — Encounter: Payer: Self-pay | Admitting: Family Medicine

## 2018-05-11 ENCOUNTER — Ambulatory Visit (INDEPENDENT_AMBULATORY_CARE_PROVIDER_SITE_OTHER): Payer: No Typology Code available for payment source | Admitting: Family Medicine

## 2018-05-11 VITALS — BP 138/80 | HR 94 | Temp 98.5°F | Ht 66.0 in | Wt 215.8 lb

## 2018-05-11 DIAGNOSIS — Z23 Encounter for immunization: Secondary | ICD-10-CM

## 2018-05-11 DIAGNOSIS — F902 Attention-deficit hyperactivity disorder, combined type: Secondary | ICD-10-CM

## 2018-05-11 DIAGNOSIS — R635 Abnormal weight gain: Secondary | ICD-10-CM

## 2018-05-11 DIAGNOSIS — Z6834 Body mass index (BMI) 34.0-34.9, adult: Secondary | ICD-10-CM

## 2018-05-11 DIAGNOSIS — E6609 Other obesity due to excess calories: Secondary | ICD-10-CM

## 2018-05-11 MED ORDER — PHENTERMINE HCL 37.5 MG PO TABS: 37.5000 mg | ORAL_TABLET | Freq: Every day | ORAL | 2 refills | Status: DC

## 2018-05-11 MED FILL — PHENTERMINE 37.5 MG TABLET: 37.5 | 30 days supply | Qty: 30 | Fill #0

## 2018-05-11 NOTE — Progress Notes (Signed)
Crystal Madden is a 42 y.o. female is here for follow up.  History of Present Illness:   Crystal Madden, CMA acting as scribe for Dr. Briscoe Madden.   HPI: Patient in office for follow up on Phentermine. She has been out of medication for about a month. She has noticed increase in appetite. She feels like she has no impulse control. Her gym has opened and she has been trying to go 1-2 times a week. She has noticed decrease in energy.     Adult ADHD Self Report Scale (most recent)    Adult ADHD Self-Report Scale (ASRS-v1.1) Symptom Checklist - 05/11/18 1523      Part A   1. How often do you have trouble wrapping up the final details of a project, once the challenging parts have been done?  (!) Often  2. How often do you have difficulty getting things done in order when you have to do a task that requires organization?  (!) Sometimes    3. How often do you have problems remembering appointments or obligations?  (!) Often  4. When you have a task that requires a lot of thought, how often do you avoid or delay getting started?  (!) Very Often    5. How often do you fidget or squirm with your hands or feet when you have to sit down for a long time?  (!) Very Often  6. How often do you feel overly active and compelled to do things, like you were driven by a motor?  (!) Very Often      Part B   7. How often do you make careless mistakes when you have to work on a boring or difficult project?  Sometimes  8. How often do you have difficulty keeping your attention when you are doing boring or repetitive work?  (!) Very Often    9. How often do you have difficulty concentrating on what people say to you, even when they are speaking to you directly?  (!) Very Often  10. How often do you misplace or have difficulty finding things at home or at work?  (!) Often    11. How often are you distracted by activity or noise around you?  Sometimes  12. How often do you leave your seat in meetings or  other situations in which you are expected to remain seated?  (!) Sometimes    13. How often do you feel restless or fidgety?  (!) Often  14. How often do you have difficulty unwinding and relaxing when you have time to yourself?  (!) Very Often    15. How often do you find yourself talking too much when you are in social situations?  (!) Often  16. When you are in a conversation, how often do you find yourself finishing the sentences of the people you are talking to, before they can finish them themselves?  (!) Very Often    30. How often do you have difficulty waiting your turn in situations when turn taking is required?  Rarely  18. How often do you interrupt others when they are busy?  Rarely       Health Maintenance Due  Topic Date Due  . HIV Screening  01/30/1993  . INFLUENZA VACCINE  03/11/2018   Depression screen [facility] 2/9 12/14/2017 09/26/2014  Decreased Interest 0 0  Down, Depressed, Hopeless 0 0  PHQ - 2 Score 0 0   PMHx, SurgHx, SocialHx, FamHx, Medications,  and Allergies were reviewed in the Visit Navigator and updated as appropriate.   Patient Active Problem List   Diagnosis Date Noted  . Diverticulitis of sigmoid colon 07/22/2016  . Abdominal pain, left lower quadrant 07/22/2016  . Leukocytosis 07/22/2016  . Hypokalemia 07/22/2016   Social History   Tobacco Use  . Smoking status: Never Smoker  . Smokeless tobacco: Never Used  Substance Use Topics  . Alcohol use: Yes    Comment: Ocassional.  . Drug use: No   Current Medications and Allergies:   .  acetaminophen (TYLENOL) 325 MG tablet, Take 2 tablets (650 mg total) by mouth every 6 (six) hours as needed for mild pain (or Fever >/= 101)., Disp: , Rfl:  .  ALPRAZolam (XANAX) 0.5 MG tablet, Take 0.25-0.5 mg by mouth 2 (two) times daily as needed., Disp: , Rfl: 1 .  bimatoprost (LATISSE) 0.03 % ophthalmic solution, Place 1 drop into both eyes at bedtime. Place one drop on applicator and apply evenly along the skin of the  upper eyelid at base of eyelashes once daily at bedtime; repeat procedure for second eye (use a clean applicator)., Disp: 3 mL, Rfl: 12 .  etonogestrel-ethinyl estradiol (NUVARING) 0.12-0.015 MG/24HR vaginal ring, Place 1 each vaginally every 28 (twenty-eight) days. Insert vaginally and leave in place for 3 consecutive weeks, then remove for 1 week., Disp: , Rfl:  .  methocarbamol (ROBAXIN) 500 MG tablet, Take 1 tablet (500 mg total) by mouth 4 (four) times daily., Disp: 40 tablet, Rfl: 0 .  nystatin-triamcinolone ointment (MYCOLOG), Apply 1 application topically 2 (two) times daily., Disp: 30 g, Rfl: 0 .  phentermine (ADIPEX-P) 37.5 MG tablet, Take 1 tablet (37.5 mg total) by mouth daily before breakfast., Disp: 30 tablet, Rfl: 2  No Known Allergies   Review of Systems   Pertinent items are noted in the HPI. Otherwise, ROS is negative.  Vitals:   Vitals:   05/11/18 1457  BP: 138/80  Pulse: 94  Temp: 98.5 F (36.9 C)  TempSrc: Oral  SpO2: 97%  Weight: 215 lb 12.8 oz (97.9 kg)  Height: 5\' 6"  (1.676 m)     Body mass index is 34.83 kg/m.  Physical Exam:   Physical Exam  Constitutional: She appears well-nourished.  HENT:  Head: Normocephalic and atraumatic.  Eyes: Pupils are equal, round, and reactive to light. EOM are normal.  Neck: Normal range of motion. Neck supple.  Cardiovascular: Normal rate, regular rhythm, normal heart sounds and intact distal pulses.  Pulmonary/Chest: Effort normal.  Abdominal: Soft.  Skin: Skin is warm.  Psychiatric: She has a normal mood and affect. Her behavior is normal.  Nursing note and vitals reviewed.  Assessment and Plan:   Crystal Madden was seen today for follow-up.  Diagnoses and all orders for this visit:  Weight gain  Class 1 obesity due to excess calories without serious comorbidity with body mass index (BMI) of 34.0 to 34.9 in adult Comments: She will focus on exercise for energy and meal planning, increasing protein. Orders: -      phentermine (ADIPEX-P) 37.5 MG tablet; Take 1 tablet (37.5 mg total) by mouth daily before breakfast.  Attention deficit hyperactivity disorder (ADHD), combined type Comments: Discussed Vyvanse.    . Reviewed expectations re: course of current medical issues. . Discussed self-management of symptoms. . Outlined signs and symptoms indicating need for more acute intervention. . Patient verbalized understanding and all questions were answered. Marland Kitchen Health Maintenance issues including appropriate healthy diet, exercise, and smoking avoidance  were discussed with patient. . See orders for this visit as documented in the electronic medical record. . Patient received an After Visit Summary.  CMA served as Education administrator during this visit. History, Physical, and Plan performed by medical provider. The above documentation has been reviewed and is accurate and complete. Crystal Madden, D.O.  Crystal Deutscher, DO [place] [place] 05/11/2018

## 2018-05-26 MED FILL — NUVARING VAGINAL RING: 0.12-0.015 | 84 days supply | Qty: 3 | Fill #3

## 2018-06-04 ENCOUNTER — Telehealth: Payer: No Typology Code available for payment source | Admitting: Family

## 2018-06-04 DIAGNOSIS — M5440 Lumbago with sciatica, unspecified side: Secondary | ICD-10-CM

## 2018-06-04 MED ORDER — PREDNISONE 10 MG (21) PO TBPK: ORAL_TABLET | ORAL | 0 refills | Status: DC

## 2018-06-04 MED ORDER — BACLOFEN 10 MG PO TABS: 10.0000 mg | ORAL_TABLET | Freq: Three times a day (TID) | ORAL | 0 refills | Status: DC

## 2018-06-04 MED ORDER — NAPROXEN 500 MG PO TABS: 500.0000 mg | ORAL_TABLET | Freq: Two times a day (BID) | ORAL | 0 refills | Status: DC

## 2018-06-04 MED FILL — BACLOFEN 10 MG TABLET: 10 | 10 days supply | Qty: 30 | Fill #0

## 2018-06-04 MED FILL — NAPROXEN 500 MG TABLET: 500 | 15 days supply | Qty: 30 | Fill #0

## 2018-06-04 MED FILL — predniSONE 10 MG TABS: 10 | 6 days supply | Qty: 21 | Fill #0

## 2018-06-04 NOTE — Progress Notes (Signed)

## 2018-06-07 MED FILL — PHENTERMINE 37.5 MG TABLET: 37.5 | 30 days supply | Qty: 30 | Fill #1

## 2018-07-05 MED FILL — PHENTERMINE 37.5 MG TABLET: 37.5 | 30 days supply | Qty: 30 | Fill #2

## 2018-08-09 ENCOUNTER — Other Ambulatory Visit: Payer: Self-pay | Admitting: Family Medicine

## 2018-08-09 DIAGNOSIS — E6609 Other obesity due to excess calories: Secondary | ICD-10-CM

## 2018-08-09 DIAGNOSIS — Z6834 Body mass index (BMI) 34.0-34.9, adult: Principal | ICD-10-CM

## 2018-08-10 NOTE — Telephone Encounter (Signed)
Ok to fill 

## 2018-08-12 ENCOUNTER — Encounter: Payer: Self-pay | Admitting: Family Medicine

## 2018-08-12 MED FILL — PHENTERMINE 37.5 MG TABLET: 37.5 | 30 days supply | Qty: 30 | Fill #0

## 2018-08-13 ENCOUNTER — Ambulatory Visit: Payer: No Typology Code available for payment source | Admitting: Family Medicine

## 2018-08-20 MED FILL — NUVARING VAGINAL RING: 0.12-0.015 | 84 days supply | Qty: 3 | Fill #0

## 2018-08-26 NOTE — Progress Notes (Signed)
Crystal Madden is a 43 y.o. female is here for follow up.  History of Present Illness:   I,Crystal Madden,acting as a scribe for Briscoe Deutscher, DO.,have documented all relevant documentation on the behalf of Briscoe Deutscher, DO,as directed by  Briscoe Deutscher, DO while in the presence of Briscoe Deutscher, DO.  HPI: 43 yo patient present for weight management and ADHD. Usual eating pattern includes: The patient eats a regular, healthy diet.. Patient is not exercising on a regular basis. Current life stressors: none. She continues to tolerate the medication without side effects. No concerns.   Health Maintenance Due  Topic Date Due  . HIV Screening  01/30/1993  . PAP SMEAR-Modifier  08/14/2018   Depression screen [facility] 2/9 12/14/2017 09/26/2014  Decreased Interest 0 0  Down, Depressed, Hopeless 0 0  PHQ - 2 Score 0 0   PMHx, SurgHx, SocialHx, FamHx, Medications, and Allergies were reviewed in the Visit Navigator and updated as appropriate.   Patient Active Problem List   Diagnosis Date Noted  . Reactive airway disease 08/28/2018  . Vitamin D deficiency 08/28/2018  . Class 1 obesity due to excess calories without serious comorbidity with body mass index (BMI) of 34.0 to 34.9 in adult 08/27/2018  . Attention deficit hyperactivity disorder (ADHD), combined type 08/27/2018  . Diverticulitis of sigmoid colon 07/22/2016   Social History   Tobacco Use  . Smoking status: Former Smoker    Last attempt to quit: 08/27/2004    Years since quitting: 14.0  . Smokeless tobacco: Never Used  Substance Use Topics  . Alcohol use: Yes    Comment: Ocassional.  . Drug use: No   Current Medications and Allergies:   .  acetaminophen (TYLENOL) 325 MG tablet, Take 2 tablets (650 mg total) by mouth every 6 (six) hours as needed for mild pain (or Fever >/= 101)., Disp: , Rfl:  .  ALPRAZolam (XANAX) 0.5 MG tablet, Take 0.25-0.5 mg by mouth 2 (two) times daily as needed., Disp: , Rfl: 1 .  bimatoprost  (LATISSE) 0.03 % ophthalmic solution, Place 1 drop into both eyes at bedtime. Place one drop on applicator and apply evenly along the skin of the upper eyelid at base of eyelashes once daily at bedtime; repeat procedure for second eye (use a clean applicator)., Disp: 3 mL, Rfl: 12 .  etonogestrel-ethinyl estradiol (NUVARING) 0.12-0.015 MG/24HR vaginal ring, Place 1 each vaginally every 28 (twenty-eight) days. Insert vaginally and leave in place for 3 consecutive weeks, then remove for 1 week., Disp: , Rfl:  .  phentermine (ADIPEX-P) 37.5 MG tablet, TAKE 1 TABLET BY MOUTH DAILY BEFORE BREAKFAST., Disp: 30 tablet, Rfl: 0  No Known Allergies   Review of Systems   Pertinent items are noted in the HPI. Otherwise, a complete ROS is negative.  Vitals:   Vitals:   08/27/18 0803  BP: 130/86  Pulse: 92  Temp: 98.4 F (36.9 C)  TempSrc: Oral  SpO2: 99%  Weight: 207 lb 9.6 oz (94.2 kg)     Body mass index is 33.51 kg/m.  Physical Exam:   Physical Exam Vitals signs and nursing note reviewed.  HENT:     Head: Normocephalic and atraumatic.  Eyes:     Pupils: Pupils are equal, round, and reactive to light.  Neck:     Musculoskeletal: Normal range of motion and neck supple.  Cardiovascular:     Rate and Rhythm: Normal rate and regular rhythm.     Heart sounds: Normal heart  sounds.  Pulmonary:     Effort: Pulmonary effort is normal.  Abdominal:     Palpations: Abdomen is soft.  Skin:    General: Skin is warm.  Psychiatric:        Behavior: Behavior normal.    Assessment and Plan:   Crystal Madden was seen today for follow-up.  Diagnoses and all orders for this visit:  Attention deficit hyperactivity disorder (ADHD), combined type  Vitamin D deficiency  Class 1 obesity due to excess calories without serious comorbidity with body mass index (BMI) of 34.0 to 34.9 in adult -     phentermine (ADIPEX-P) 37.5 MG tablet; One po q am and 1/2 q noon  Screening for HIV (human  immunodeficiency virus) -     HIV Antibody (routine testing w rflx); Future  Mild intermittent reactive airway disease without complication -     albuterol (PROVENTIL HFA;VENTOLIN HFA) 108 (90 Base) MCG/ACT inhaler; Inhale 2 puffs into the lungs every 6 (six) hours as needed for wheezing or shortness of breath.  Screening for lipid disorders -     Lipid panel -     LDL cholesterol, direct  Other fatigue -     CBC with Differential/Platelet -     Comprehensive metabolic panel   . Orders and follow up as documented in [place], reviewed diet, exercise and weight control, cardiovascular risk and specific lipid/LDL goals reviewed, reviewed medications and side effects in detail.  . Reviewed expectations re: course of current medical issues. . Outlined signs and symptoms indicating need for more acute intervention. . Patient verbalized understanding and all questions were answered. . Patient received an After Visit Summary.  CMA served as Education administrator during this visit. History, Physical, and Plan performed by medical provider. The above documentation has been reviewed and is accurate and complete. Briscoe Deutscher, D.O.  Briscoe Deutscher, DO [place] 08/28/2018

## 2018-08-27 ENCOUNTER — Encounter: Payer: Self-pay | Admitting: Family Medicine

## 2018-08-27 ENCOUNTER — Ambulatory Visit (INDEPENDENT_AMBULATORY_CARE_PROVIDER_SITE_OTHER): Payer: No Typology Code available for payment source | Admitting: Family Medicine

## 2018-08-27 VITALS — BP 130/86 | HR 92 | Temp 98.4°F | Wt 207.6 lb

## 2018-08-27 DIAGNOSIS — F902 Attention-deficit hyperactivity disorder, combined type: Secondary | ICD-10-CM

## 2018-08-27 DIAGNOSIS — Z13220 Encounter for screening for lipoid disorders: Secondary | ICD-10-CM | POA: Diagnosis not present

## 2018-08-27 DIAGNOSIS — Z6834 Body mass index (BMI) 34.0-34.9, adult: Secondary | ICD-10-CM

## 2018-08-27 DIAGNOSIS — R5383 Other fatigue: Secondary | ICD-10-CM | POA: Diagnosis not present

## 2018-08-27 DIAGNOSIS — Z114 Encounter for screening for human immunodeficiency virus [HIV]: Secondary | ICD-10-CM | POA: Diagnosis not present

## 2018-08-27 DIAGNOSIS — E669 Obesity, unspecified: Secondary | ICD-10-CM | POA: Insufficient documentation

## 2018-08-27 DIAGNOSIS — E559 Vitamin D deficiency, unspecified: Secondary | ICD-10-CM

## 2018-08-27 DIAGNOSIS — E6609 Other obesity due to excess calories: Secondary | ICD-10-CM | POA: Diagnosis not present

## 2018-08-27 DIAGNOSIS — Z6833 Body mass index (BMI) 33.0-33.9, adult: Secondary | ICD-10-CM | POA: Insufficient documentation

## 2018-08-27 DIAGNOSIS — J4520 Mild intermittent asthma, uncomplicated: Secondary | ICD-10-CM

## 2018-08-27 LAB — COMPREHENSIVE METABOLIC PANEL
ALT: 25 U/L (ref 0–35)
AST: 16 U/L (ref 0–37)
Albumin: 4.2 g/dL (ref 3.5–5.2)
Alkaline Phosphatase: 55 U/L (ref 39–117)
BUN: 13 mg/dL (ref 6–23)
CO2: 24 mEq/L (ref 19–32)
Calcium: 9.4 mg/dL (ref 8.4–10.5)
Chloride: 107 mEq/L (ref 96–112)
Creatinine, Ser: 0.84 mg/dL (ref 0.40–1.20)
GFR: 74.88 mL/min (ref >60.00)
Glucose, Bld: 91 mg/dL (ref 70–99)
Potassium: 4.1 mEq/L (ref 3.5–5.1)
Sodium: 139 mEq/L (ref 135–145)
Total Bilirubin: 0.8 mg/dL (ref 0.2–1.2)
Total Protein: 6.8 g/dL (ref 6.0–8.3)

## 2018-08-27 LAB — CBC WITH DIFFERENTIAL/PLATELET
Basophils Absolute: 0 10*3/uL (ref 0.0–0.1)
Basophils Relative: 0.4 % (ref 0.0–3.0)
Eosinophils Absolute: 0.1 10*3/uL (ref 0.0–0.7)
Eosinophils Relative: 0.9 % (ref 0.0–5.0)
HCT: 40.4 % (ref 36.0–46.0)
Hemoglobin: 14.5 g/dL (ref 12.0–15.0)
Lymphocytes Relative: 25.5 % (ref 12.0–46.0)
Lymphs Abs: 2 10*3/uL (ref 0.7–4.0)
MCHC: 35.8 g/dL (ref 30.0–36.0)
MCV: 89.5 fl (ref 78.0–100.0)
Monocytes Absolute: 0.3 10*3/uL (ref 0.1–1.0)
Monocytes Relative: 4.1 % (ref 3.0–12.0)
Neutro Abs: 5.4 10*3/uL (ref 1.4–7.7)
Neutrophils Relative %: 69.1 % (ref 43.0–77.0)
Platelets: 209 10*3/uL (ref 150.0–400.0)
RBC: 4.51 Mil/uL (ref 3.87–5.11)
RDW: 11.9 % (ref 11.5–15.5)
WBC: 7.8 10*3/uL (ref 4.0–10.5)

## 2018-08-27 LAB — LIPID PANEL
Cholesterol: 186 mg/dL (ref 0–200)
HDL: 49.9 mg/dL (ref >39.00)
NonHDL: 135.98
Total CHOL/HDL Ratio: 4
Triglycerides: 212 mg/dL — ABNORMAL HIGH (ref 0.0–149.0)
VLDL: 42.4 mg/dL — ABNORMAL HIGH (ref 0.0–40.0)

## 2018-08-27 LAB — LDL CHOLESTEROL, DIRECT: Direct LDL: 113 mg/dL

## 2018-08-27 MED ORDER — PHENTERMINE HCL 37.5 MG PO TABS: ORAL_TABLET | ORAL | 2 refills | Status: DC

## 2018-08-27 MED ORDER — ALBUTEROL SULFATE HFA 108 (90 BASE) MCG/ACT IN AERS: 2.0000 | INHALATION_SPRAY | Freq: Four times a day (QID) | RESPIRATORY_TRACT | 0 refills | Status: DC | PRN

## 2018-08-27 MED FILL — VENTOLIN HFA 90 MCG INHALER: 108 (90 BAS | 25 days supply | Qty: 18 | Fill #0

## 2018-08-27 NOTE — Patient Instructions (Signed)
Health Maintenance Due  Topic Date Due  . HIV Screening  01/30/1993  . PAP SMEAR-Modifier  08/14/2018

## 2018-08-28 DIAGNOSIS — E559 Vitamin D deficiency, unspecified: Secondary | ICD-10-CM | POA: Insufficient documentation

## 2018-08-28 DIAGNOSIS — J45909 Unspecified asthma, uncomplicated: Secondary | ICD-10-CM | POA: Insufficient documentation

## 2018-08-28 LAB — HIV ANTIBODY (ROUTINE TESTING W REFLEX): HIV 1&2 Ab, 4th Generation: NONREACTIVE

## 2018-09-01 MED FILL — PHENTERMINE 37.5 MG TABLET: 37.5 | 30 days supply | Qty: 45 | Fill #0

## 2018-09-17 LAB — HM PAP SMEAR: HM Pap smear: NEGATIVE

## 2018-09-21 ENCOUNTER — Other Ambulatory Visit: Payer: Self-pay | Admitting: Obstetrics & Gynecology

## 2018-09-21 DIAGNOSIS — Z1231 Encounter for screening mammogram for malignant neoplasm of breast: Secondary | ICD-10-CM

## 2018-09-23 ENCOUNTER — Ambulatory Visit
Admission: RE | Admit: 2018-09-23 | Discharge: 2018-09-23 | Disposition: A | Discharge status: Home / Self Care | Payer: No Typology Code available for payment source | Source: Ambulatory Visit

## 2018-09-23 DIAGNOSIS — Z1231 Encounter for screening mammogram for malignant neoplasm of breast: Secondary | ICD-10-CM

## 2018-09-23 IMAGING — MG DIGITAL SCREENING BILATERAL MAMMOGRAM WITH TOMO AND CAD
6 of 10 series · 6 of 30 positions shown · non-contrast
Comparison: None.

CLINICAL DATA: Screening.

EXAM:
DIGITAL SCREENING BILATERAL MAMMOGRAM WITH TOMO AND CAD

[L MLO synth-2D]
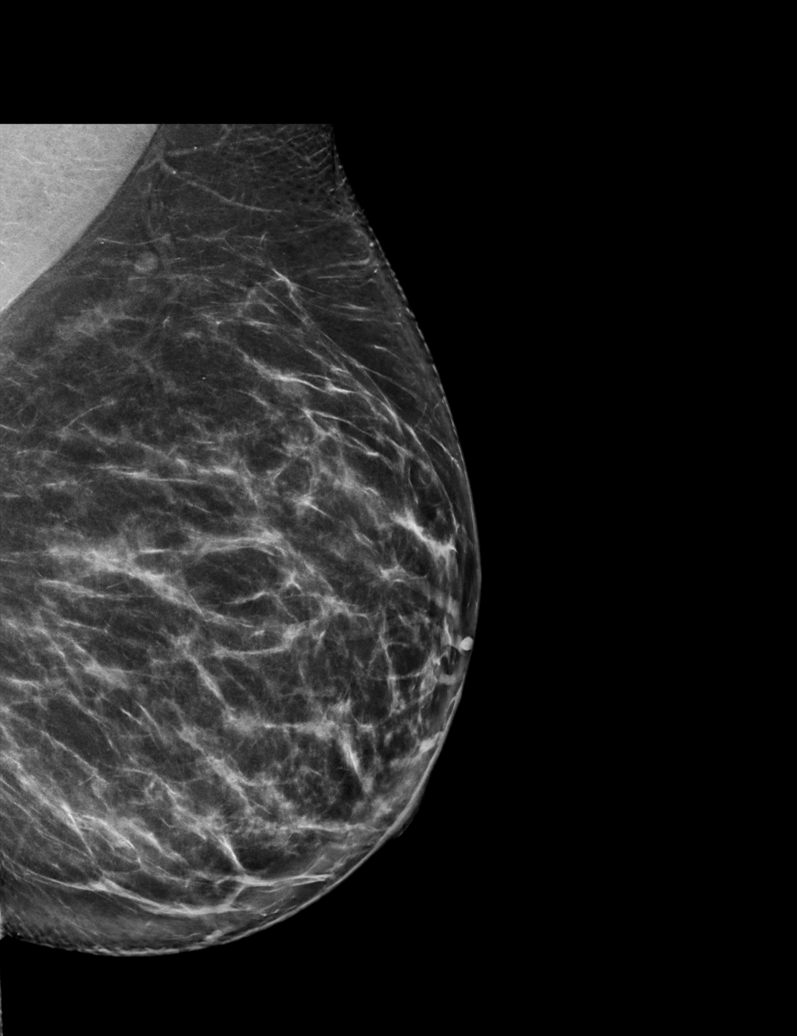

[L CC synth-2D]
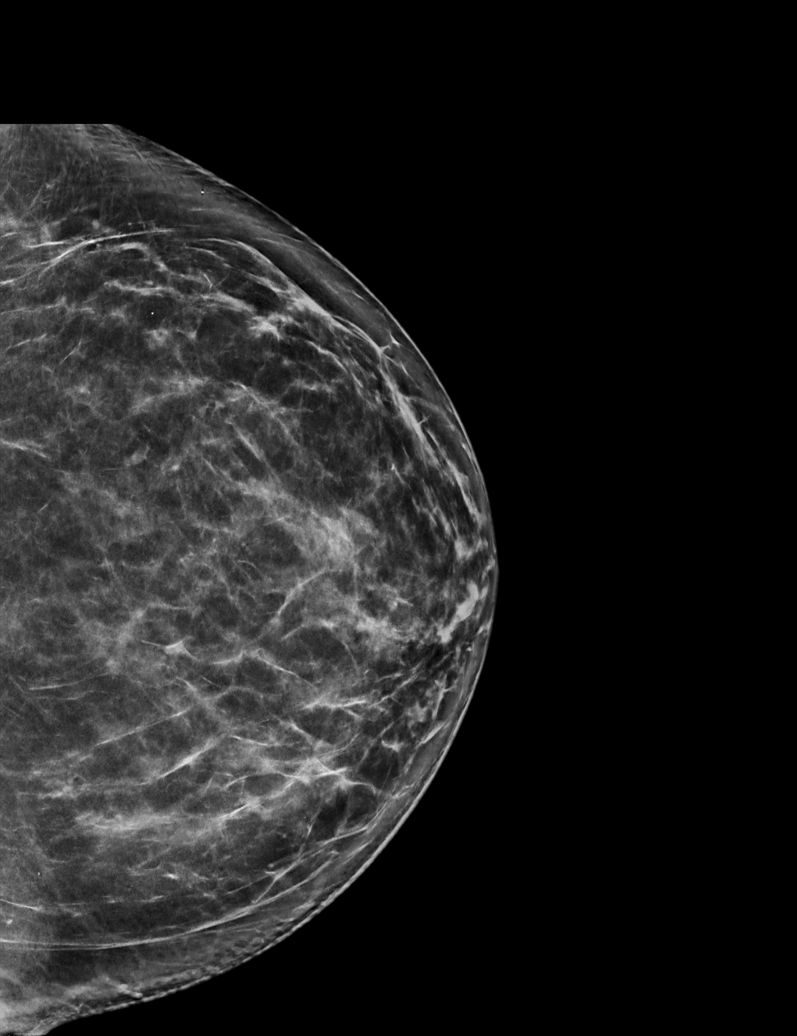

[R CC synth-2D]
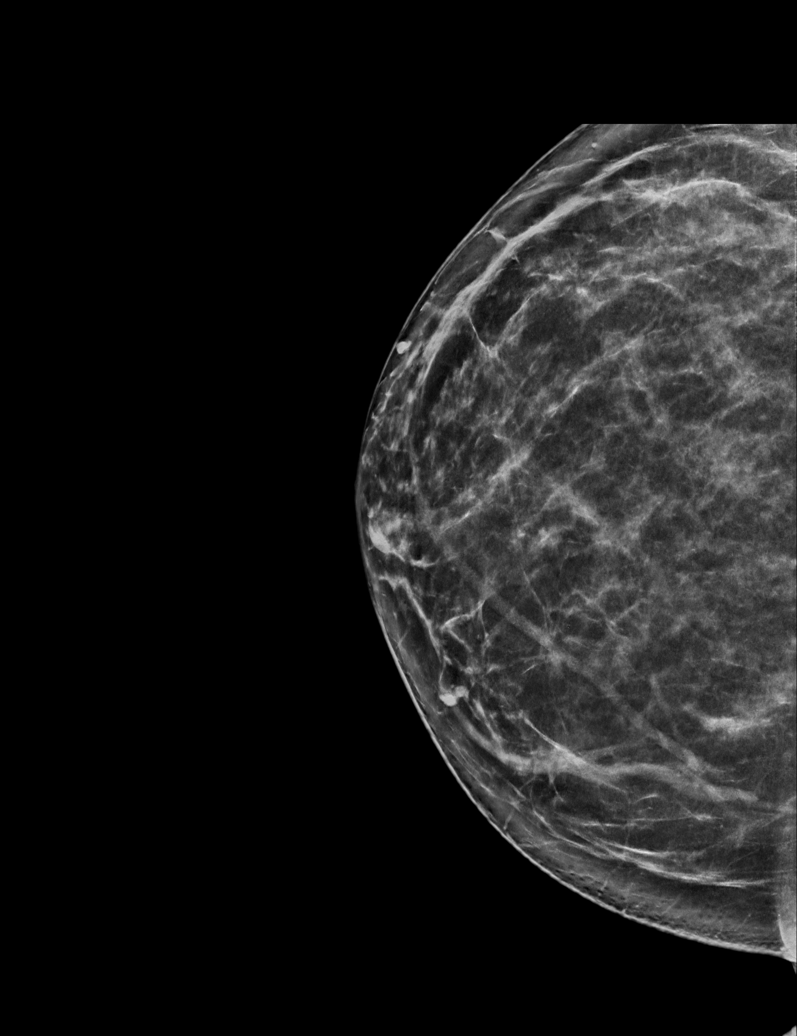

[R MLO synth-2D (1 of 2)]
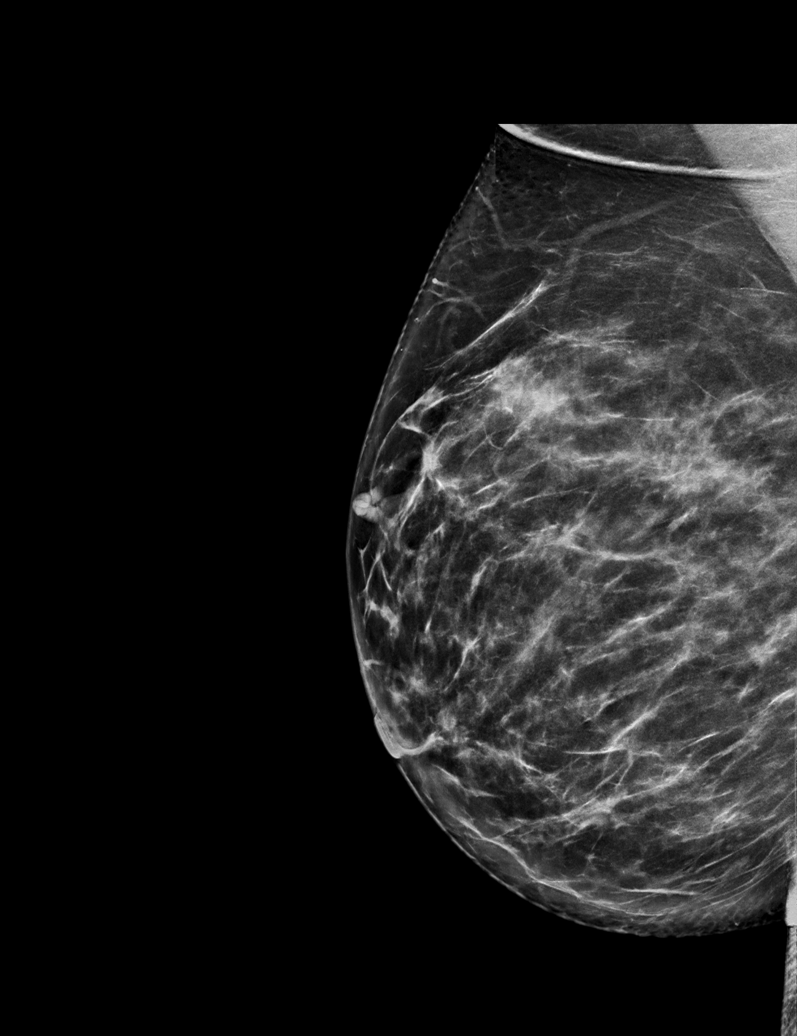

[R MLO synth-2D (2 of 2)]
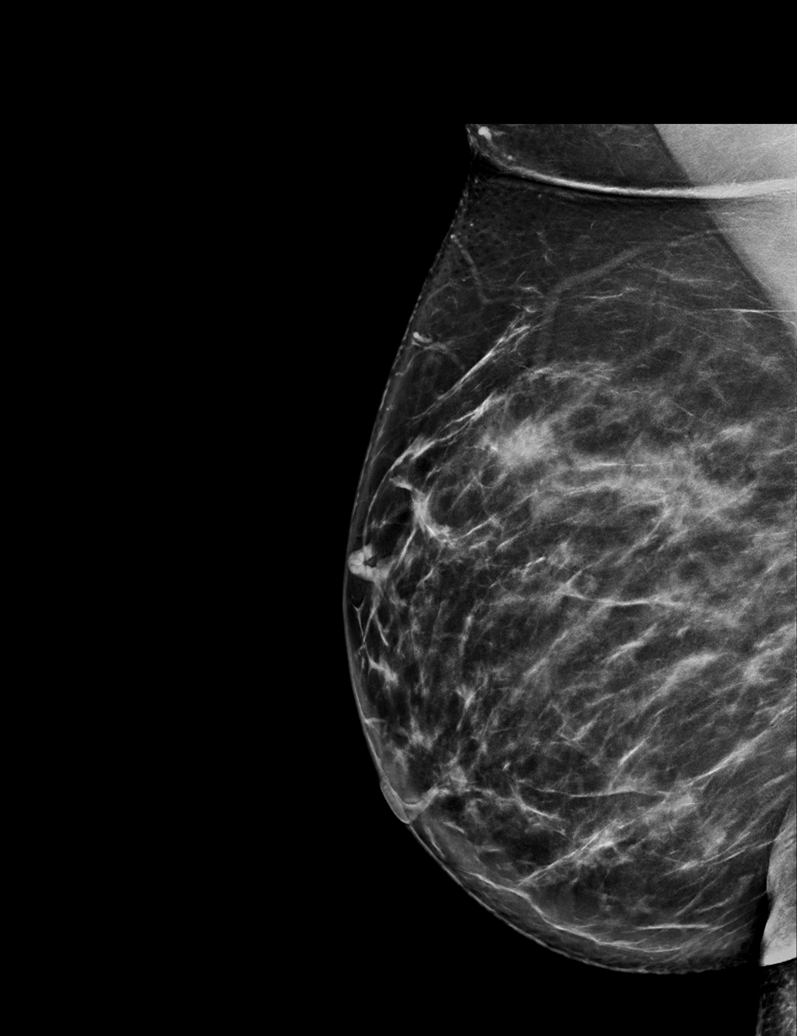

[R MLO tomo · tomo slice 45/89.0]
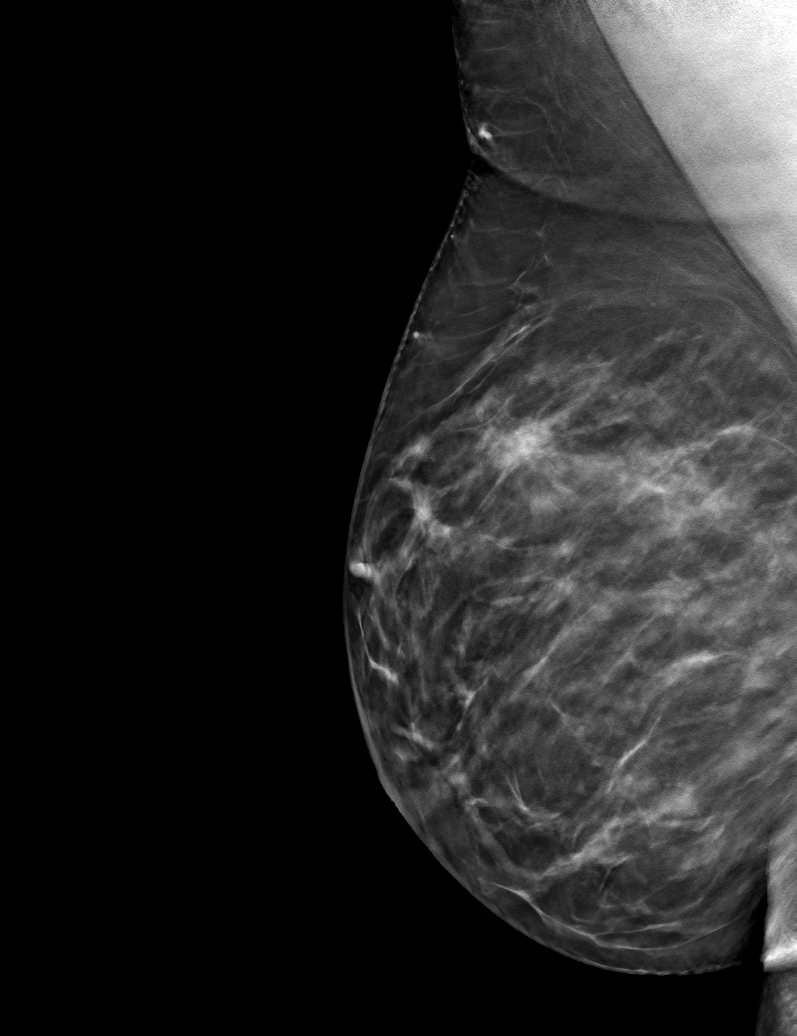

[6 of 30 positions shown; findings below may reference images not displayed]

ACR Breast Density Category c: The breast tissue is heterogeneously
dense, which may obscure small masses
FINDINGS: There are no findings suspicious for malignancy. Images were
processed with CAD.
IMPRESSION: No mammographic evidence of malignancy. A result letter of this
screening mammogram will be mailed directly to the patient.

RECOMMENDATION:
Screening mammogram in one year. (Code:[4W])

BI-RADS CATEGORY  1: Negative.

## 2018-09-28 MED FILL — PHENTERMINE 37.5 MG TABLET: 37.5 | 30 days supply | Qty: 45 | Fill #1

## 2018-10-26 MED FILL — PHENTERMINE 37.5 MG TABLET: 37.5 | 30 days supply | Qty: 45 | Fill #2

## 2018-11-05 MED FILL — ETONOGESTREL-ETHINYL ESTRAD: 0.12-0.015 | 84 days supply | Qty: 3 | Fill #0

## 2018-11-17 ENCOUNTER — Encounter: Payer: Self-pay | Admitting: Family Medicine

## 2018-11-24 ENCOUNTER — Ambulatory Visit (INDEPENDENT_AMBULATORY_CARE_PROVIDER_SITE_OTHER): Payer: No Typology Code available for payment source | Admitting: Family Medicine

## 2018-11-24 ENCOUNTER — Other Ambulatory Visit: Payer: Self-pay

## 2018-11-24 VITALS — Ht 66.0 in

## 2018-11-24 DIAGNOSIS — J4520 Mild intermittent asthma, uncomplicated: Secondary | ICD-10-CM

## 2018-11-24 DIAGNOSIS — F418 Other specified anxiety disorders: Secondary | ICD-10-CM | POA: Diagnosis not present

## 2018-11-24 DIAGNOSIS — Z6834 Body mass index (BMI) 34.0-34.9, adult: Secondary | ICD-10-CM

## 2018-11-24 DIAGNOSIS — E6609 Other obesity due to excess calories: Secondary | ICD-10-CM | POA: Diagnosis not present

## 2018-11-24 NOTE — Progress Notes (Addendum)
Virtual Visit via Video   I connected with Crystal Madden by a video enabled telemedicine application and verified that I am speaking with the correct person using two identifiers. Location patient: Home Location provider: [facility], Office Persons participating in the virtual visit: Ramia, Sidney, DO Reviewed all precautions and expectations with prevention of Covid-19  I discussed the limitations of evaluation and management by telemedicine and the availability of in person appointments. The patient expressed understanding and agreed to proceed.  Subjective:   HPI: Patient in for follow up for weight loss medications. Usual eating pattern includes: The patient eats a regular, healthy diet.. Usual physical activity includes walking. Stress: now working at [employer] center.   Medication is helping to control appetite. No side effects. Continued weight loss.   Review of Systems  Constitutional: Negative for chills, fever, malaise/fatigue and weight loss.  Respiratory: Negative for cough, shortness of breath and wheezing.   Cardiovascular: Negative for chest pain, palpitations and leg swelling.  Gastrointestinal: Negative for abdominal pain, constipation, diarrhea, nausea and vomiting.  Genitourinary: Negative for dysuria and urgency.  Musculoskeletal: Negative for joint pain and myalgias.  Skin: Negative for rash.  Neurological: Negative for dizziness and headaches.  Psychiatric/Behavioral: Negative for depression, substance abuse and suicidal ideas. The patient is not nervous/anxious.    Patient Active Problem List   Diagnosis Date Noted  . Reactive airway disease 08/28/2018  . Vitamin D deficiency 08/28/2018  . Class 1 obesity due to excess calories without serious comorbidity with body mass index (BMI) of 34.0 to 34.9 in adult 08/27/2018  . Attention deficit hyperactivity disorder (ADHD), combined type 08/27/2018  . Diverticulitis of sigmoid  colon 07/22/2016    Social History   Tobacco Use  . Smoking status: Former Smoker    Last attempt to quit: 08/27/2004    Years since quitting: 14.2  . Smokeless tobacco: Never Used  Substance Use Topics  . Alcohol use: Yes    Comment: Ocassional.    Current Outpatient Medications:  .  albuterol (PROVENTIL HFA;VENTOLIN HFA) 108 (90 Base) MCG/ACT inhaler, Inhale 2 puffs into the lungs every 6 (six) hours as needed for wheezing or shortness of breath., Disp: 1 Inhaler, Rfl: 0 .  ALPRAZolam (XANAX) 0.5 MG tablet, Take 1 tablet (0.5 mg total) by mouth 2 (two) times daily as needed. 1 tab in am and 1/2 tab in afternoon, Disp: 30 tablet, Rfl: 2 .  bimatoprost (LATISSE) 0.03 % ophthalmic solution, Place 1 drop into both eyes at bedtime. Place one drop on applicator and apply evenly along the skin of the upper eyelid at base of eyelashes once daily at bedtime; repeat procedure for second eye (use a clean applicator)., Disp: 3 mL, Rfl: 12 .  etonogestrel-ethinyl estradiol (NUVARING) 0.12-0.015 MG/24HR vaginal ring, Place 1 each vaginally every 28 (twenty-eight) days. Insert vaginally and leave in place for 3 consecutive weeks, then remove for 1 week., Disp: , Rfl:  .  phentermine (ADIPEX-P) 37.5 MG tablet, One po q am and 1/2 q noon, Disp: 45 tablet, Rfl: 2  No Known Allergies  Objective:   VITALS: Per patient if applicable, see vitals. GENERAL: Alert, appears well and in no acute distress. HEENT: Atraumatic, conjunctiva clear, no obvious abnormalities on inspection of external nose and ears. NECK: Normal movements of the head and neck. CARDIOPULMONARY: No increased WOB. Speaking in clear sentences. I:E ratio WNL.  MS: Moves all visible extremities without noticeable abnormality. PSYCH: Pleasant and cooperative, well-groomed. Speech  normal rate and rhythm. Affect is appropriate. Insight and judgement are appropriate. Attention is focused, linear, and appropriate.  NEURO: CN grossly intact.  Oriented as arrived to appointment on time with no prompting. Moves both UE equally.  SKIN: No obvious lesions, wounds, erythema, or cyanosis noted on face or hands.  Assessment and Plan:   Lichelle was seen today for follow-up.  Diagnoses and all orders for this visit:  Situational anxiety -     ALPRAZolam (XANAX) 0.5 MG tablet; Take 1 tablet (0.5 mg total) by mouth 2 (two) times daily as needed. 1 tab in am and 1/2 tab in afternoon  Class 1 obesity due to excess calories without serious comorbidity with body mass index (BMI) of 34.0 to 34.9 in adult -     phentermine (ADIPEX-P) 37.5 MG tablet; One po q am and 1/2 q noon  Mild intermittent reactive airway disease without complication Comments: No concerns. Doing well. Continue current treatment.     . Reviewed expectations re: course of current medical issues. . Discussed self-management of symptoms. . Outlined signs and symptoms indicating need for more acute intervention. . Patient verbalized understanding and all questions were answered. Marland Kitchen Health Maintenance issues including appropriate healthy diet, exercise, and smoking avoidance were discussed with patient. . See orders for this visit as documented in the electronic medical record.  Briscoe Deutscher, DO  Records requested if needed. Time spent: 25 minutes, of which >50% was spent in obtaining information about her symptoms, reviewing her previous labs, evaluations, and treatments, counseling her about her condition (please see the discussed topics above), and developing a plan to further investigate it; she had a number of questions which I addressed.

## 2018-11-25 ENCOUNTER — Encounter: Payer: Self-pay | Admitting: Family Medicine

## 2018-11-25 MED ORDER — PHENTERMINE HCL 37.5 MG PO TABS: ORAL_TABLET | ORAL | 2 refills | Status: DC

## 2018-11-25 MED ORDER — ALPRAZOLAM 0.5 MG PO TABS: 0.5000 mg | ORAL_TABLET | Freq: Two times a day (BID) | ORAL | 2 refills | Status: DC | PRN

## 2018-11-25 MED FILL — PHENTERMINE 37.5 MG TABLET: 37.5 | 30 days supply | Qty: 45 | Fill #0

## 2018-11-25 MED FILL — ALPRAZolam 0.5 MG TABS: 0.5 | 20 days supply | Qty: 30 | Fill #0

## 2018-11-29 ENCOUNTER — Ambulatory Visit: Payer: No Typology Code available for payment source | Admitting: Family Medicine

## 2018-12-23 MED FILL — PHENTERMINE 37.5 MG TABLET: 37.5 | 30 days supply | Qty: 45 | Fill #1

## 2019-01-17 ENCOUNTER — Other Ambulatory Visit: Payer: Self-pay | Admitting: Family Medicine

## 2019-01-18 MED FILL — ETONOGESTREL-ETHINYL ESTRAD: 0.12-0.015 | 84 days supply | Qty: 3 | Fill #1

## 2019-01-20 MED FILL — PHENTERMINE 37.5 MG TABLET: 37.5 | 30 days supply | Qty: 45 | Fill #2

## 2019-02-21 ENCOUNTER — Other Ambulatory Visit: Payer: Self-pay | Admitting: Family Medicine

## 2019-02-21 DIAGNOSIS — E6609 Other obesity due to excess calories: Secondary | ICD-10-CM

## 2019-02-21 DIAGNOSIS — Z6834 Body mass index (BMI) 34.0-34.9, adult: Secondary | ICD-10-CM

## 2019-02-21 MED FILL — PHENTERMINE 37.5 MG TABLET: 37.5 | 30 days supply | Qty: 45 | Fill #0

## 2019-02-21 NOTE — Telephone Encounter (Signed)
Requesting refill on Phentermine 37.5 mg take 1 1/2 tablets, Last OV 11/24/2018, Last Rx 11/25/18 , #45, refill 2.

## 2019-03-21 MED FILL — PHENTERMINE 37.5 MG TABLET: 37.5 | 30 days supply | Qty: 45 | Fill #1

## 2019-04-04 MED FILL — ETONOGESTREL-ETHINYL ESTRAD: 0.12-0.015 | 84 days supply | Qty: 3 | Fill #2

## 2019-04-04 MED FILL — ALPRAZolam 0.5 MG TABS: 0.5 | 20 days supply | Qty: 30 | Fill #1

## 2019-04-19 MED FILL — PHENTERMINE 37.5 MG TABLET: 37.5 | 30 days supply | Qty: 45 | Fill #2

## 2019-05-24 ENCOUNTER — Other Ambulatory Visit: Payer: Self-pay | Admitting: Family Medicine

## 2019-05-24 ENCOUNTER — Other Ambulatory Visit: Payer: Self-pay

## 2019-05-24 ENCOUNTER — Ambulatory Visit (INDEPENDENT_AMBULATORY_CARE_PROVIDER_SITE_OTHER): Payer: No Typology Code available for payment source | Admitting: Physician Assistant

## 2019-05-24 ENCOUNTER — Encounter: Payer: Self-pay | Admitting: Physician Assistant

## 2019-05-24 VITALS — BP 200/100 | HR 100 | Temp 97.5°F | Ht 66.0 in | Wt 207.2 lb

## 2019-05-24 DIAGNOSIS — F418 Other specified anxiety disorders: Secondary | ICD-10-CM

## 2019-05-24 DIAGNOSIS — R030 Elevated blood-pressure reading, without diagnosis of hypertension: Secondary | ICD-10-CM | POA: Diagnosis not present

## 2019-05-24 LAB — COMPREHENSIVE METABOLIC PANEL
ALT: 23 U/L (ref 0–35)
AST: 19 U/L (ref 0–37)
Albumin: 4.1 g/dL (ref 3.5–5.2)
Alkaline Phosphatase: 54 U/L (ref 39–117)
BUN: 16 mg/dL (ref 6–23)
CO2: 23 mEq/L (ref 19–32)
Calcium: 9.2 mg/dL (ref 8.4–10.5)
Chloride: 106 mEq/L (ref 96–112)
Creatinine, Ser: 0.77 mg/dL (ref 0.40–1.20)
GFR: 82.48 mL/min (ref >60.00)
Glucose, Bld: 143 mg/dL — ABNORMAL HIGH (ref 70–99)
Potassium: 3.6 mEq/L (ref 3.5–5.1)
Sodium: 138 mEq/L (ref 135–145)
Total Bilirubin: 0.5 mg/dL (ref 0.2–1.2)
Total Protein: 6.6 g/dL (ref 6.0–8.3)

## 2019-05-24 LAB — TSH: TSH: 1.74 u[IU]/mL (ref 0.35–4.50)

## 2019-05-24 LAB — CBC WITH DIFFERENTIAL/PLATELET
Basophils Absolute: 0 10*3/uL (ref 0.0–0.1)
Basophils Relative: 0.5 % (ref 0.0–3.0)
Eosinophils Absolute: 0.1 10*3/uL (ref 0.0–0.7)
Eosinophils Relative: 1.2 % (ref 0.0–5.0)
HCT: 40.2 % (ref 36.0–46.0)
Hemoglobin: 13.9 g/dL (ref 12.0–15.0)
Lymphocytes Relative: 28.3 % (ref 12.0–46.0)
Lymphs Abs: 2.3 10*3/uL (ref 0.7–4.0)
MCHC: 34.6 g/dL (ref 30.0–36.0)
MCV: 91.3 fl (ref 78.0–100.0)
Monocytes Absolute: 0.4 10*3/uL (ref 0.1–1.0)
Monocytes Relative: 4.8 % (ref 3.0–12.0)
Neutro Abs: 5.4 10*3/uL (ref 1.4–7.7)
Neutrophils Relative %: 65.2 % (ref 43.0–77.0)
Platelets: 239 10*3/uL (ref 150.0–400.0)
RBC: 4.4 Mil/uL (ref 3.87–5.11)
RDW: 12.4 % (ref 11.5–15.5)
WBC: 8.2 10*3/uL (ref 4.0–10.5)

## 2019-05-24 LAB — POCT URINE PREGNANCY: Preg Test, Ur: NEGATIVE

## 2019-05-24 MED ORDER — CYCLOBENZAPRINE HCL 10 MG PO TABS: 10.0000 mg | ORAL_TABLET | Freq: Every day | ORAL | 0 refills | Status: DC

## 2019-05-24 MED ORDER — AMLODIPINE BESYLATE 5 MG PO TABS: 5.0000 mg | ORAL_TABLET | Freq: Every day | ORAL | 0 refills | Status: DC

## 2019-05-24 MED FILL — AMLODIPINE BESYLATE 5 MG TA: 5 | 30 days supply | Qty: 30 | Fill #0

## 2019-05-24 MED FILL — CYCLOBENZAPRINE 10 MG TAB: 10 | 30 days supply | Qty: 30 | Fill #0

## 2019-05-24 NOTE — Progress Notes (Signed)
Acute Office Visit  Subjective:    Patient ID: Crystal Madden, female    DOB: 02-25-1976, 43 y.o.   MRN: [id number]  Chief Complaint  Patient presents with  . Headache    some times with aura  . elevated BP    145/90, 165/90    HPI   Patient is in today for elevated blood pressures. Currently not on any medication. Has had some recent increases in BP at dentist office, but nothing >150/90.  About a month ago, patient reports that she decided to stop her phentermine, as she felt like it was not helping her stay energized, and if anything made her feel little bit worse.  She is also been having some increased headaches, and has also noticed some visual auras in the past week, which she has not had before.  She denies chest pain, shortness of breath, blurred vision, dizziness, lower leg swelling.  She denies any prior issues with hypertension during pregnancy or after pregnancies.  She does admit to drinking 1 to 2 glasses of wine per night.  Denies any excessive caffeine intake, usually has 1 to 2 cups daily.  She does admit to eating salty foods, including cheeses, crackers, and other processed foods.  She does admit to increased stress as well.  Headaches are mostly tension related, they start in her neck and go up the back of her head.  Sometimes she will have a headache when she wakes up but when she gets up and moving the headache is gone.  Does have strong family history of hypertension.  Father had stroke and heart surgery.  Past Medical History:  Diagnosis Date  . Hyperlipidemia   . Obesity (BMI 30-39.9)     Past Surgical History:  Procedure Laterality Date  . BLADDER SUSPENSION    . CESAREAN SECTION    . LUMBAR MICRODISCECTOMY  2018  . TONSILLECTOMY  2000    Family History  Problem Relation Age of Onset  . Hypertension Mother   . Hyperlipidemia Mother   . COPD Father   . Cancer Father   . Heart disease Father   . Cancer Maternal Grandfather   . Cancer  Paternal Grandmother   . Colon cancer Neg Hx     Social History   Socioeconomic History  . Marital status: Married    Spouse name: Not on file  . Number of children: Not on file  . Years of education: Not on file  . Highest education level: Not on file  Occupational History  . Occupation: Quality Improvement    Employer: [employer]  Social Needs  . Financial resource strain: Not on file  . Food insecurity    Worry: Not on file    Inability: Not on file  . Transportation needs    Medical: Not on file    Non-medical: Not on file  Tobacco Use  . Smoking status: Former Smoker    Quit date: 08/27/2004    Years since quitting: 14.7  . Smokeless tobacco: Never Used  Substance and Sexual Activity  . Alcohol use: Yes    Comment: Ocassional.  . Drug use: No  . Sexual activity: Not on file  Lifestyle  . Physical activity    Days per week: Not on file    Minutes per session: Not on file  . Stress: Not on file  Relationships  . Social Herbalist on phone: Not on file    Gets together: Not on file  Attends religious service: Not on file    Active member of club or organization: Not on file    Attends meetings of clubs or organizations: Not on file    Relationship status: Not on file  . Intimate partner violence    Fear of current or ex partner: Not on file    Emotionally abused: Not on file    Physically abused: Not on file    Forced sexual activity: Not on file  Other Topics Concern  . Not on file  Social History Narrative  . Not on file    Outpatient Medications Prior to Visit  Medication Sig Dispense Refill  . ALPRAZolam (XANAX) 0.5 MG tablet Take 1 tablet (0.5 mg total) by mouth 2 (two) times daily as needed. 1 tab in am and 1/2 tab in afternoon 30 tablet 2  . bimatoprost (LATISSE) 0.03 % ophthalmic solution PUT 1 DROP ON APPLICATOR &APPLY EVENLY ALONG SKIN OF UPPER LID AT BASE OF LASHES TO BOTH EYES AT BED 3 mL 12  . etonogestrel-ethinyl estradiol  (NUVARING) 0.12-0.015 MG/24HR vaginal ring Place 1 each vaginally every 28 (twenty-eight) days. Insert vaginally and leave in place for 3 consecutive weeks, then remove for 1 week.    Marland Kitchen albuterol (PROVENTIL HFA;VENTOLIN HFA) 108 (90 Base) MCG/ACT inhaler Inhale 2 puffs into the lungs every 6 (six) hours as needed for wheezing or shortness of breath. 1 Inhaler 0  . phentermine (ADIPEX-P) 37.5 MG tablet TAKE 1 TABLET BY MOUTH IN THE AM AND 1/2 TABLET AT NOON 45 tablet 2   No facility-administered medications prior to visit.     No Known Allergies  Review of Systems  Constitutional: Negative for chills, fever, malaise/fatigue and weight loss.  Respiratory: Negative for shortness of breath.   Cardiovascular: Negative for chest pain, orthopnea, claudication and leg swelling.  Gastrointestinal: Negative for heartburn, nausea and vomiting.  Neurological: Positive for headaches. Negative for dizziness and tingling.  Psychiatric/Behavioral: The patient is nervous/anxious.        Objective:    Physical Exam  Constitutional: She appears well-developed and well-nourished. She is cooperative.  Non-toxic appearance. She does not have a sickly appearance. She does not appear ill. No distress.  HENT:  Head: Normocephalic and atraumatic.  Cardiovascular: Regular rhythm and normal heart sounds. Tachycardia present.  No LE edema  Pulmonary/Chest: Effort normal and breath sounds normal. No accessory muscle usage. No respiratory distress.  Neurological: She is alert.  Skin: Skin is warm, dry and intact.  Psychiatric: She has a normal mood and affect. Her speech is normal.  Nursing note and vitals reviewed.   BP (!) 200/100   Pulse 100   Temp (!) 97.5 F (36.4 C)   Ht 5\' 6"  (1.676 m)   Wt 207 lb 3.2 oz (94 kg)   SpO2 99%   BMI 33.44 kg/m  Wt Readings from Last 3 Encounters:  05/24/19 207 lb 3.2 oz (94 kg)  08/27/18 207 lb 9.6 oz (94.2 kg)  05/11/18 215 lb 12.8 oz (97.9 kg)    Health  Maintenance Due  Topic Date Due  . PAP SMEAR-Modifier  08/14/2018    There are no preventive care reminders to display for this patient.      Assessment & Plan:   Problem List Items Addressed This Visit      Other   Elevated blood pressure reading - Primary New onset.  Uncontrolled today.  EKG reviewed with Dr. Juleen China. EKG tracing is personally reviewed.  EKG notes NSR.  No acute changes.  Will check CBC, CMP, TSH.  Urine pregnancy negative.  I did recommend that she stop her NuvaRing due to the auras that she is having, to prevent increased risk of stroke.  Patient verbalized understanding to plan.  We will also start 5 mg Norvasc and have her follow-up in 1 week.  Strict ER precautions given for any changes or worsening symptoms.  Blood pressure log also given.  Recommended that she reduce her alcohol intake as well.  Limit salty foods.    Relevant Orders   EKG 12-Lead (Completed)   CBC with Differential/Platelet   Comprehensive metabolic panel   TSH   POCT urine pregnancy (Completed)       Meds ordered this encounter  Medications  . cyclobenzaprine (FLEXERIL) 10 MG tablet    Sig: Take 1 tablet (10 mg total) by mouth at bedtime.    Dispense:  30 tablet    Refill:  0    Order Specific Question:   Supervising Provider    Answer:   Juleen China, ERICA [[id number]]  . amLODipine (NORVASC) 5 MG tablet    Sig: Take 1 tablet (5 mg total) by mouth daily.    Dispense:  30 tablet    Refill:  0    Order Specific Question:   Supervising Provider    Answer:   Juleen China, ERICA [[id number]]     Inda Coke, PA

## 2019-05-24 NOTE — Patient Instructions (Signed)
It was great to see you!  Stop Nuvaring. Please cut down on the alcohol. Avoid anti-inflammatories -- like ibuprofen, as this may make your blood pressure worse. Start norvasc 5 mg daily.  Let's follow-up in 1 week, sooner if you have concerns.  If any chest pain, severe headache, shortness of breath, vision changes --> GO TO THE ER.  Take care,  Inda Coke PA-C    Bring all of your meds, your BP cuff and your record of home blood pressures to your next appointment.   Keep salt intake to a minimum, especially watch canned and prepared boxed foods.  Eat more fresh fruits and vegetables and fewer canned items.  Avoid eating in fast food restaurants.     HOW TO TAKE YOUR BLOOD PRESSURE:  Rest 5 minutes before taking your blood pressure.   Don't smoke or drink caffeinated beverages for at least 30 minutes before.  Take your blood pressure before (not after) you eat.  Sit comfortably with your back supported and both feet on the floor (don't cross your legs).  Elevate your arm to heart level on a table or a desk.  Use the proper sized cuff. It should fit smoothly and snugly around your bare upper arm. There should be enough room to slip a fingertip under the cuff. The bottom edge of the cuff should be 1 inch above the crease of the elbow.  Ideally, take 3 measurements at one sitting and record the average.

## 2019-05-26 ENCOUNTER — Telehealth: Payer: Self-pay

## 2019-05-26 ENCOUNTER — Encounter: Payer: Self-pay | Admitting: Physician Assistant

## 2019-05-26 NOTE — Telephone Encounter (Signed)
Spoke to patient regarding MyChart message sent on 10/15.  Patient denies any chest pain, blurry vision, pain in arm/shoulder, excessive sweating, nausea, SOB, confusion.  States only symptom is persistent headaches.  She reports that she had been c/o headaches during her visit with Inda Coke on 10/13.    I advised patient to increase fluid intake, take precautions to limit sodium as much as possible, limit alcohol.  Again, gave red flag precautions for ED if she develops any of the above listed symptoms.  Also advised to continue monitoring bp and let us know if blood pressure readings continue running high, as she just started on medication 2 days ago.    Patient verbalized understanding.

## 2019-05-27 MED FILL — ALPRAZolam 0.5 MG TABS: 0.5 | 20 days supply | Qty: 30 | Fill #0

## 2019-05-31 ENCOUNTER — Ambulatory Visit (INDEPENDENT_AMBULATORY_CARE_PROVIDER_SITE_OTHER): Payer: No Typology Code available for payment source | Admitting: Physician Assistant

## 2019-05-31 ENCOUNTER — Encounter: Payer: Self-pay | Admitting: Physician Assistant

## 2019-05-31 ENCOUNTER — Other Ambulatory Visit: Payer: Self-pay

## 2019-05-31 VITALS — BP 142/96 | HR 88 | Temp 98.1°F | Ht 66.0 in | Wt 207.2 lb

## 2019-05-31 DIAGNOSIS — R030 Elevated blood-pressure reading, without diagnosis of hypertension: Secondary | ICD-10-CM | POA: Diagnosis not present

## 2019-05-31 DIAGNOSIS — R7309 Other abnormal glucose: Secondary | ICD-10-CM

## 2019-05-31 LAB — LIPID PANEL
Cholesterol: 232 mg/dL — ABNORMAL HIGH (ref 0–200)
HDL: 42.5 mg/dL (ref >39.00)
NonHDL: 189.75
Total CHOL/HDL Ratio: 5
Triglycerides: 396 mg/dL — ABNORMAL HIGH (ref 0.0–149.0)
VLDL: 79.2 mg/dL — ABNORMAL HIGH (ref 0.0–40.0)

## 2019-05-31 LAB — HEMOGLOBIN A1C: Hgb A1c MFr Bld: 5.3 % (ref 4.6–6.5)

## 2019-05-31 LAB — LDL CHOLESTEROL, DIRECT: Direct LDL: 141 mg/dL

## 2019-05-31 MED ORDER — AMLODIPINE BESYLATE 10 MG PO TABS: 10.0000 mg | ORAL_TABLET | Freq: Every day | ORAL | 0 refills | Status: DC

## 2019-05-31 MED FILL — AMLODIPINE BESYLATE 10 MG T: 10 | 30 days supply | Qty: 30 | Fill #0

## 2019-05-31 NOTE — Progress Notes (Signed)
Crystal Madden is a 43 y.o. female is here to follow up on blood pressure.  I acted as a Education administrator for [employer], PA-C [employer], LPN  History of Present Illness:   Chief Complaint  Patient presents with  . f/u on elevated blood pressure    HPI   Elevated blood pressure. Pt here for one week follow up on blood pressure. She was started on Amlodipine 5 mg daily last week. Pt has been checking blood pressure at home. In the morning running 130/84 to 163/110, PM 144/96-165/105. Pt denies headaches, dizziness, blurred vision, chest pain, SOB or lower leg edema. Denies excessive caffeine intake, stimulant usage, excessive alcohol intake or increase in salt consumption.  HA has improved since she was last seen.   Elevated glucose At last lab draw, HgbA1c was found to be 143. Denies polyuria, polydipsia, excessive glucose intake, unintentional weight changes.  Wt Readings from Last 5 Encounters:  05/31/19 207 lb 4 oz (94 kg)  05/24/19 207 lb 3.2 oz (94 kg)  08/27/18 207 lb 9.6 oz (94.2 kg)  05/11/18 215 lb 12.8 oz (97.9 kg)  01/11/18 214 lb 3.2 oz (97.2 kg)      Health Maintenance Due  Topic Date Due  . PAP SMEAR-Modifier  08/14/2018    Past Medical History:  Diagnosis Date  . Hyperlipidemia   . Obesity (BMI 30-39.9)      Social History   Socioeconomic History  . Marital status: Married    Spouse name: Not on file  . Number of children: Not on file  . Years of education: Not on file  . Highest education level: Not on file  Occupational History  . Occupation: Quality Improvement    Employer: [employer]  Social Needs  . Financial resource strain: Not on file  . Food insecurity    Worry: Not on file    Inability: Not on file  . Transportation needs    Medical: Not on file    Non-medical: Not on file  Tobacco Use  . Smoking status: Former Smoker    Quit date: 08/27/2004    Years since quitting: 14.7  . Smokeless tobacco: Never Used  Substance  and Sexual Activity  . Alcohol use: Yes    Comment: Ocassional.  . Drug use: No  . Sexual activity: Not on file  Lifestyle  . Physical activity    Days per week: Not on file    Minutes per session: Not on file  . Stress: Not on file  Relationships  . Social Herbalist on phone: Not on file    Gets together: Not on file    Attends religious service: Not on file    Active member of club or organization: Not on file    Attends meetings of clubs or organizations: Not on file    Relationship status: Not on file  . Intimate partner violence    Fear of current or ex partner: Not on file    Emotionally abused: Not on file    Physically abused: Not on file    Forced sexual activity: Not on file  Other Topics Concern  . Not on file  Social History Narrative  . Not on file    Past Surgical History:  Procedure Laterality Date  . BLADDER SUSPENSION    . CESAREAN SECTION    . LUMBAR MICRODISCECTOMY  2018  . TONSILLECTOMY  2000    Family History  Problem Relation Age of  Onset  . Hypertension Mother   . Hyperlipidemia Mother   . COPD Father   . Cancer Father   . Heart disease Father   . Cancer Maternal Grandfather   . Cancer Paternal Grandmother   . Colon cancer Neg Hx     PMHx, SurgHx, SocialHx, FamHx, Medications, and Allergies were reviewed in the Visit Navigator and updated as appropriate.   Patient Active Problem List   Diagnosis Date Noted  . Elevated blood pressure reading 05/24/2019  . Reactive airway disease 08/28/2018  . Vitamin D deficiency 08/28/2018  . Class 1 obesity due to excess calories without serious comorbidity with body mass index (BMI) of 34.0 to 34.9 in adult 08/27/2018  . Attention deficit hyperactivity disorder (ADHD), combined type 08/27/2018  . Diverticulitis of sigmoid colon 07/22/2016    Social History   Tobacco Use  . Smoking status: Former Smoker    Quit date: 08/27/2004    Years since quitting: 14.7  . Smokeless tobacco:  Never Used  Substance Use Topics  . Alcohol use: Yes    Comment: Ocassional.  . Drug use: No    Current Medications and Allergies:    Current Outpatient Medications:  .  ALPRAZolam (XANAX) 0.5 MG tablet, TAKE 1 TABLET BY MOUTH IN THE AM AND 1/2 TABLET IN THE AFTERNOON (Patient taking differently: as needed. ), Disp: 30 tablet, Rfl: 2 .  bimatoprost (LATISSE) 0.03 % ophthalmic solution, PUT 1 DROP ON APPLICATOR &APPLY EVENLY ALONG SKIN OF UPPER LID AT BASE OF LASHES TO BOTH EYES AT BED, Disp: 3 mL, Rfl: 12 .  cyclobenzaprine (FLEXERIL) 10 MG tablet, Take 1 tablet (10 mg total) by mouth at bedtime. (Patient taking differently: Take 10 mg by mouth at bedtime as needed. ), Disp: 30 tablet, Rfl: 0 .  amLODipine (NORVASC) 10 MG tablet, Take 1 tablet (10 mg total) by mouth daily., Disp: 30 tablet, Rfl: 0  No Known Allergies  Review of Systems   ROS  Negative unless otherwise specified per HPI.   Vitals:   Vitals:   05/31/19 1046 05/31/19 1104  BP: (!) 140/96 (!) 142/96  Pulse: 96 88  Temp: 98.1 F (36.7 C)   TempSrc: Temporal   SpO2: 98%   Weight: 207 lb 4 oz (94 kg)   Height: 5\' 6"  (1.676 m)      Body mass index is 33.45 kg/m.   Physical Exam:    Physical Exam Vitals signs and nursing note reviewed.  Constitutional:      General: She is not in acute distress.    Appearance: She is well-developed. She is not ill-appearing or toxic-appearing.  Cardiovascular:     Rate and Rhythm: Normal rate and regular rhythm.     Pulses: Normal pulses.     Heart sounds: Normal heart sounds, S1 normal and S2 normal.     Comments: No LE edema Pulmonary:     Effort: Pulmonary effort is normal.     Breath sounds: Normal breath sounds.  Skin:    General: Skin is warm and dry.  Neurological:     Mental Status: She is alert.     GCS: GCS eye subscore is 4. GCS verbal subscore is 5. GCS motor subscore is 6.  Psychiatric:        Speech: Speech normal.        Behavior: Behavior normal.  Behavior is cooperative.      Assessment and Plan:    Chyra was seen today for f/u on elevated blood  pressure.  Diagnoses and all orders for this visit:  Elevated blood pressure reading Uncontrolled but improving.  Increase Norvasc to 10 mg daily.  Follow-up in 2 weeks.  Continue to monitor blood pressures at home and bring log with her on return.  Worsening precautions advised in the interim. -     Lipid panel  Elevated glucose We will check hemoglobin A1c and advise further. -     Hemoglobin A1c  Other orders -     amLODipine (NORVASC) 10 MG tablet; Take 1 tablet (10 mg total) by mouth daily.    . Reviewed expectations re: course of current medical issues. . Discussed self-management of symptoms. . Outlined signs and symptoms indicating need for more acute intervention. . Patient verbalized understanding and all questions were answered. . See orders for this visit as documented in the electronic medical record. . Patient received an After Visit Summary.  CMA or LPN served as scribe during this visit. History, Physical, and Plan performed by medical provider. The above documentation has been reviewed and is accurate and complete.   Inda Coke, PA-C , [place] [facility] 05/31/2019  Follow-up: Return in about 2 weeks (around 06/14/2019) for Blood Pressure F/U.

## 2019-05-31 NOTE — Patient Instructions (Signed)
It was great to see you!  Increase Norvasc to 10 mg daily. I will be in touch with lab results.  Let's follow-up in 2 weeks, sooner if you have concerns.  Take care,  Inda Coke PA-C

## 2019-06-10 MED FILL — NORLYDA 0.35 MG TABS: 0.35 | 84 days supply | Qty: 84 | Fill #0

## 2019-06-14 ENCOUNTER — Other Ambulatory Visit: Payer: Self-pay

## 2019-06-14 ENCOUNTER — Encounter: Payer: Self-pay | Admitting: Physician Assistant

## 2019-06-14 ENCOUNTER — Ambulatory Visit (INDEPENDENT_AMBULATORY_CARE_PROVIDER_SITE_OTHER): Payer: No Typology Code available for payment source | Admitting: Physician Assistant

## 2019-06-14 VITALS — BP 162/98 | HR 93 | Temp 98.0°F | Ht 66.0 in | Wt 211.0 lb

## 2019-06-14 DIAGNOSIS — I10 Essential (primary) hypertension: Secondary | ICD-10-CM | POA: Diagnosis not present

## 2019-06-14 MED ORDER — VALSARTAN-HYDROCHLOROTHIAZIDE 80-12.5 MG PO TABS: 1.0000 | ORAL_TABLET | Freq: Every day | ORAL | 1 refills | Status: DC

## 2019-06-14 MED FILL — VALSARTAN-HCTZ 80-12.5 MG T: 80-12.5 | 30 days supply | Qty: 30 | Fill #0

## 2019-06-14 NOTE — Assessment & Plan Note (Addendum)
Blood pressure is improving.  Due to intolerance of amlodipine causing lower leg swelling, we are going to switch to Diovan 80-12.5 mg tablet daily.  She is great about checking her blood pressures at home and will keep [place] posted via MyChart if the switch of medications does not go as anticapated.  Follow-up in 6 weeks, sooner if concerns.

## 2019-06-14 NOTE — Patient Instructions (Signed)
It was great to see you!  Stop Norvasc.  Start Diovan combo BP med.  Let's follow-up in 6 weeks, sooner if you have concerns.  Take care,  Inda Coke PA-C

## 2019-06-14 NOTE — Progress Notes (Signed)
Crystal Madden is a 43 y.o. female is here to follow up on blood pressure.  I acted as a Education administrator for [employer], PA-C [employer], LPN  History of Present Illness:   Chief Complaint  Patient presents with  . f/u on blood pressure    HPI   Hypertension Currently taking Norvasc 10 mg, she was increased from 5 mg Norvasc about 2 weeks ago. At home blood pressure readings are: 130's/83-84 in AM and 140's/90 in PM. Patient denies chest pain, SOB, blurred vision, dizziness, unusual headaches. Pt is c/o lower leg swelling in the evening since on 10 mg. Patient is compliant with medication. Denies excessive caffeine intake, stimulant usage, excessive alcohol intake, or increase in salt consumption.  BP Readings from Last 3 Encounters:  06/14/19 (!) 162/98  05/31/19 (!) 142/96  05/24/19 (!) 200/100   She did start a progesterone only birth control.  Her husband is going to be evaluated for vasectomy soon.  Health Maintenance Due  Topic Date Due  . PAP SMEAR-Modifier  08/14/2018    Past Medical History:  Diagnosis Date  . Hyperlipidemia   . Obesity (BMI 30-39.9)      Social History   Socioeconomic History  . Marital status: Married    Spouse name: Not on file  . Number of children: Not on file  . Years of education: Not on file  . Highest education level: Not on file  Occupational History  . Occupation: Quality Improvement    Employer: [employer]  Social Needs  . Financial resource strain: Not on file  . Food insecurity    Worry: Not on file    Inability: Not on file  . Transportation needs    Medical: Not on file    Non-medical: Not on file  Tobacco Use  . Smoking status: Former Smoker    Quit date: 08/27/2004    Years since quitting: 14.8  . Smokeless tobacco: Never Used  Substance and Sexual Activity  . Alcohol use: Yes    Comment: Ocassional.  . Drug use: No  . Sexual activity: Not on file  Lifestyle  . Physical activity    Days per week:  Not on file    Minutes per session: Not on file  . Stress: Not on file  Relationships  . Social Herbalist on phone: Not on file    Gets together: Not on file    Attends religious service: Not on file    Active member of club or organization: Not on file    Attends meetings of clubs or organizations: Not on file    Relationship status: Not on file  . Intimate partner violence    Fear of current or ex partner: Not on file    Emotionally abused: Not on file    Physically abused: Not on file    Forced sexual activity: Not on file  Other Topics Concern  . Not on file  Social History Narrative  . Not on file    Past Surgical History:  Procedure Laterality Date  . BLADDER SUSPENSION    . CESAREAN SECTION    . LUMBAR MICRODISCECTOMY  2018  . TONSILLECTOMY  2000    Family History  Problem Relation Age of Onset  . Hypertension Mother   . Hyperlipidemia Mother   . COPD Father   . Cancer Father   . Heart disease Father   . Cancer Maternal Grandfather   . Cancer Paternal Grandmother   .  Colon cancer Neg Hx     PMHx, SurgHx, SocialHx, FamHx, Medications, and Allergies were reviewed in the Visit Navigator and updated as appropriate.   Patient Active Problem List   Diagnosis Date Noted  . Hypertension 06/14/2019  . Reactive airway disease 08/28/2018  . Vitamin D deficiency 08/28/2018  . Class 1 obesity due to excess calories without serious comorbidity with body mass index (BMI) of 34.0 to 34.9 in adult 08/27/2018  . Attention deficit hyperactivity disorder (ADHD), combined type 08/27/2018  . Diverticulitis of sigmoid colon 07/22/2016    Social History   Tobacco Use  . Smoking status: Former Smoker    Quit date: 08/27/2004    Years since quitting: 14.8  . Smokeless tobacco: Never Used  Substance Use Topics  . Alcohol use: Yes    Comment: Ocassional.  . Drug use: No    Current Medications and Allergies:    Current Outpatient Medications:  .  ALPRAZolam  (XANAX) 0.5 MG tablet, TAKE 1 TABLET BY MOUTH IN THE AM AND 1/2 TABLET IN THE AFTERNOON (Patient taking differently: as needed. ), Disp: 30 tablet, Rfl: 2 .  bimatoprost (LATISSE) 0.03 % ophthalmic solution, PUT 1 DROP ON APPLICATOR &APPLY EVENLY ALONG SKIN OF UPPER LID AT BASE OF LASHES TO BOTH EYES AT BED, Disp: 3 mL, Rfl: 12 .  cyclobenzaprine (FLEXERIL) 10 MG tablet, Take 1 tablet (10 mg total) by mouth at bedtime. (Patient taking differently: Take 10 mg by mouth at bedtime as needed. ), Disp: 30 tablet, Rfl: 0 .  valsartan-hydrochlorothiazide (DIOVAN-HCT) 80-12.5 MG tablet, Take 1 tablet by mouth daily., Disp: 90 tablet, Rfl: 1   Allergies  Allergen Reactions  . Amlodipine Swelling    Lower leg swelling when increased to 10 mg    Review of Systems   Review of Systems  Constitutional: Negative for chills, fever, malaise/fatigue and weight loss.  Respiratory: Negative for shortness of breath.   Cardiovascular: Positive for leg swelling. Negative for chest pain, orthopnea and claudication.  Gastrointestinal: Negative for heartburn, nausea and vomiting.  Neurological: Negative for dizziness, tingling and headaches.    Vitals:   Vitals:   06/14/19 0846 06/14/19 0902  BP: (!) 160/98 (!) 162/98  Pulse: 99 93  Temp: 98 F (36.7 C)   TempSrc: Temporal   SpO2: 98%   Weight: 211 lb (95.7 kg)   Height: 5\' 6"  (1.676 m)      Body mass index is 34.06 kg/m.   Physical Exam:    Physical Exam Vitals signs and nursing note reviewed.  Constitutional:      General: She is not in acute distress.    Appearance: She is well-developed. She is not ill-appearing or toxic-appearing.  Cardiovascular:     Rate and Rhythm: Normal rate and regular rhythm.     Pulses: Normal pulses.     Heart sounds: Normal heart sounds, S1 normal and S2 normal.     Comments: No LE edema Pulmonary:     Effort: Pulmonary effort is normal.     Breath sounds: Normal breath sounds.  Skin:    General: Skin is  warm and dry.  Neurological:     Mental Status: She is alert.     GCS: GCS eye subscore is 4. GCS verbal subscore is 5. GCS motor subscore is 6.  Psychiatric:        Speech: Speech normal.        Behavior: Behavior normal. Behavior is cooperative.      Assessment and  Plan:   Hypertension Blood pressure is improving.  Due to intolerance of amlodipine causing lower leg swelling, we are going to switch to Diovan 80-12.5 mg tablet daily.  She is great about checking her blood pressures at home and will keep [place] posted via MyChart if the switch of medications does not go as anticapated.  Follow-up in 6 weeks, sooner if concerns.   . Reviewed expectations re: course of current medical issues. . Discussed self-management of symptoms. . Outlined signs and symptoms indicating need for more acute intervention. . Patient verbalized understanding and all questions were answered. . See orders for this visit as documented in the electronic medical record. . Patient received an After Visit Summary.    CMA or LPN served as scribe during this visit. History, Physical, and Plan performed by medical provider. The above documentation has been reviewed and is accurate and complete.   Inda Coke, PA-C [place], [place] 06/14/2019  Follow-up: Return in about 6 weeks (around 07/26/2019) for Blood Pressure F/U.

## 2019-06-29 ENCOUNTER — Encounter: Payer: Self-pay | Admitting: Physician Assistant

## 2019-07-04 ENCOUNTER — Other Ambulatory Visit: Payer: Self-pay | Admitting: Physician Assistant

## 2019-07-04 MED FILL — CYCLOBENZAPRINE 10 MG TAB: 10 | 30 days supply | Qty: 30 | Fill #0

## 2019-07-26 ENCOUNTER — Encounter: Payer: Self-pay | Admitting: Physician Assistant

## 2019-07-26 ENCOUNTER — Ambulatory Visit: Payer: No Typology Code available for payment source | Admitting: Physician Assistant

## 2019-07-26 ENCOUNTER — Ambulatory Visit (INDEPENDENT_AMBULATORY_CARE_PROVIDER_SITE_OTHER): Payer: No Typology Code available for payment source | Admitting: Physician Assistant

## 2019-07-26 VITALS — BP 124/76 | HR 83

## 2019-07-26 DIAGNOSIS — I10 Essential (primary) hypertension: Secondary | ICD-10-CM

## 2019-07-26 DIAGNOSIS — R198 Other specified symptoms and signs involving the digestive system and abdomen: Secondary | ICD-10-CM

## 2019-07-26 NOTE — Progress Notes (Signed)
Virtual Visit via Video   I connected with Crystal Madden on 07/26/19 at  4:00 PM EST by a video enabled telemedicine application and verified that I am speaking with the correct person using two identifiers. Location patient: Home Location provider: [facility], Office Persons participating in the virtual visit: Bernard, [place] PA-C, Anselmo Pickler, LPN   I discussed the limitations of evaluation and management by telemedicine and the availability of in person appointments. The patient expressed understanding and agreed to proceed.  I acted as a Education administrator for [employer], [employer], LPN  Subjective:   HPI:   Hypertension Currently taking Valsartan-HCTZ 80-12.5 mg. At home blood pressure readings are averaging 125/75. Patient denies chest pain, SOB, blurred vision, dizziness, unusual headaches, lower leg swelling. Patient is  compliant with medication. Denies excessive caffeine intake, stimulant usage, excessive alcohol intake, or increase in salt consumption.  BP Readings from Last 3 Encounters:  07/26/19 124/76  06/14/19 (!) 162/98  05/31/19 (!) 142/96   TMJ Dysfunction Has been dealing with jaw feeling very tight throughout the day, like she is clenching all day long. Flexeril helps at night. Wondering if there is anything that she can do to help this.  ROS: See pertinent positives and negatives per HPI.  Patient Active Problem List   Diagnosis Date Noted  . Hypertension 06/14/2019  . Reactive airway disease 08/28/2018  . Vitamin D deficiency 08/28/2018  . Class 1 obesity due to excess calories without serious comorbidity with body mass index (BMI) of 34.0 to 34.9 in adult 08/27/2018  . Attention deficit hyperactivity disorder (ADHD), combined type 08/27/2018  . Diverticulitis of sigmoid colon 07/22/2016    Social History   Tobacco Use  . Smoking status: Former Smoker    Quit date: 08/27/2004    Years since quitting: 14.9  .  Smokeless tobacco: Never Used  Substance Use Topics  . Alcohol use: Yes    Comment: Ocassional.    Current Outpatient Medications:  .  ALPRAZolam (XANAX) 0.5 MG tablet, TAKE 1 TABLET BY MOUTH IN THE AM AND 1/2 TABLET IN THE AFTERNOON (Patient taking differently: as needed. ), Disp: 30 tablet, Rfl: 2 .  bimatoprost (LATISSE) 0.03 % ophthalmic solution, PUT 1 DROP ON APPLICATOR &APPLY EVENLY ALONG SKIN OF UPPER LID AT BASE OF LASHES TO BOTH EYES AT BED, Disp: 3 mL, Rfl: 12 .  cyclobenzaprine (FLEXERIL) 10 MG tablet, TAKE 1 TABLET (10 MG TOTAL) BY MOUTH AT BEDTIME., Disp: 30 tablet, Rfl: 0 .  valsartan-hydrochlorothiazide (DIOVAN-HCT) 80-12.5 MG tablet, Take 1 tablet by mouth daily., Disp: 90 tablet, Rfl: 1  Allergies  Allergen Reactions  . Amlodipine Swelling    Lower leg swelling when increased to 10 mg    Objective:   VITALS: Per patient if applicable, see vitals. GENERAL: Alert, appears well and in no acute distress. HEENT: Atraumatic, conjunctiva clear, no obvious abnormalities on inspection of external nose and ears. NECK: Normal movements of the head and neck. CARDIOPULMONARY: No increased WOB. Speaking in clear sentences. I:E ratio WNL.  MS: Moves all visible extremities without noticeable abnormality. PSYCH: Pleasant and cooperative, well-groomed. Speech normal rate and rhythm. Affect is appropriate. Insight and judgement are appropriate. Attention is focused, linear, and appropriate.  NEURO: CN grossly intact. Oriented as arrived to appointment on time with no prompting. Moves both UE equally.  SKIN: No obvious lesions, wounds, erythema, or cyanosis noted on face or hands.  Assessment and Plan:   [name] was seen  today for hypertension.  Diagnoses and all orders for this visit:  Hypertension, unspecified type Normotensive. Continue Diovan-HCT 80-12.5 mg. Follow-up in 6 months, sooner if needed.  Clenching of teeth Will recommend patient obtain a bite guard and wear at  night. Continue flexeril prn. Will send to Dr. Lynne Leader for further work-up.  . Reviewed expectations re: course of current medical issues. . Discussed self-management of symptoms. . Outlined signs and symptoms indicating need for more acute intervention. . Patient verbalized understanding and all questions were answered. Marland Kitchen Health Maintenance issues including appropriate healthy diet, exercise, and smoking avoidance were discussed with patient. . See orders for this visit as documented in the electronic medical record.  I discussed the assessment and treatment plan with the patient. The patient was provided an opportunity to ask questions and all were answered. The patient agreed with the plan and demonstrated an understanding of the instructions.   The patient was advised to call back or seek an in-person evaluation if the symptoms worsen or if the condition fails to improve as anticipated.   CMA or LPN served as scribe during this visit. History, Physical, and Plan performed by medical provider. The above documentation has been reviewed and is accurate and complete.   [place], [place] 07/26/2019

## 2019-07-27 ENCOUNTER — Encounter: Payer: Self-pay | Admitting: Physician Assistant

## 2019-07-29 ENCOUNTER — Ambulatory Visit: Payer: No Typology Code available for payment source | Attending: Internal Medicine

## 2019-07-29 ENCOUNTER — Other Ambulatory Visit: Payer: No Typology Code available for payment source

## 2019-07-29 ENCOUNTER — Telehealth: Payer: No Typology Code available for payment source | Admitting: Physician Assistant

## 2019-07-29 DIAGNOSIS — Z20822 Contact with and (suspected) exposure to covid-19: Secondary | ICD-10-CM

## 2019-07-29 DIAGNOSIS — M5442 Lumbago with sciatica, left side: Secondary | ICD-10-CM | POA: Diagnosis not present

## 2019-07-29 MED ORDER — NAPROXEN 500 MG PO TABS: 500.0000 mg | ORAL_TABLET | Freq: Two times a day (BID) | ORAL | 0 refills | Status: DC

## 2019-07-29 MED ORDER — PREDNISONE 20 MG PO TABS: ORAL_TABLET | ORAL | 0 refills | Status: DC

## 2019-07-29 MED FILL — predniSONE 20 MG TABS: 20 | 12 days supply | Qty: 27 | Fill #0

## 2019-07-29 MED FILL — NAPROXEN 500 MG TABLET: 500 | 15 days supply | Qty: 30 | Fill #0

## 2019-07-29 NOTE — Progress Notes (Signed)
We are sorry that you are not feeling well.  Here is how we plan to help!  Based on what you have shared with me it looks like you mostly have acute back pain.  Acute back pain is defined as musculoskeletal pain that can resolve in 1-3 weeks with conservative treatment.  I have prescribed Naprosyn 500 mg take one by mouth twice a day non-steroid anti-inflammatory (NSAID) as well as a prednisone taper.  Please continue taking your flexeril as prescribed. Some patients experience stomach irritation or in increased heartburn with anti-inflammatory drugs.  Please keep in mind that muscle relaxer's can cause fatigue and should not be taken while at work or driving.  Back pain is very common.  The pain often gets better over time.  The cause of back pain is usually not dangerous.  Most people can learn to manage their back pain on their own.  Home Care  Stay active.  Start with short walks on flat ground if you can.  Try to walk farther each day.  Do not sit, drive or stand in one place for more than 30 minutes.  Do not stay in bed.  Do not avoid exercise or work.  Activity can help your back heal faster.  Be careful when you bend or lift an object.  Bend at your knees, keep the object close to you, and do not twist.  Sleep on a firm mattress.  Lie on your side, and bend your knees.  If you lie on your back, put a pillow under your knees.  Only take medicines as told by your doctor.  Put ice on the injured area.  Put ice in a plastic bag  Place a towel between your skin and the bag  Leave the ice on for 15-20 minutes, 3-4 times a day for the first 2-3 days. 210 After that, you can switch between ice and heat packs.  Ask your doctor about back exercises or massage.  Avoid feeling anxious or stressed.  Find good ways to deal with stress, such as exercise.  Get Help Right Way If:  Your pain does not go away with rest or medicine.  Your pain does not go away in 1 week.  You have new  problems.  You do not feel well.  The pain spreads into your legs.  You cannot control when you poop (bowel movement) or pee (urinate)  You feel sick to your stomach (nauseous) or throw up (vomit)  You have belly (abdominal) pain.  You feel like you may pass out (faint).  If you develop a fever.  Make Sure you:  Understand these instructions.  Will watch your condition  Will get help right away if you are not doing well or get worse.  Your e-visit answers were reviewed by a board certified advanced clinical practitioner to complete your personal care plan.  Depending on the condition, your plan could have included both over the counter or prescription medications.  If there is a problem please reply  once you have received a response from your provider.  Your safety is important to [place].  If you have drug allergies check your prescription carefully.    You can use MyChart to ask questions about today's visit, request a non-urgent call back, or ask for a work or school excuse for 24 hours related to this e-Visit. If it has been greater than 24 hours you will need to follow up with your provider, or enter a new e-Visit to  address those concerns.  You will get an e-mail in the next two days asking about your experience.  I hope that your e-visit has been valuable and will speed your recovery. Thank you for using e-visits.  Greater than 5 minutes, yet less than 10 minutes of time have been spent researching, coordinating, and implementing care for this patient today

## 2019-07-30 LAB — NOVEL CORONAVIRUS, NAA: SARS-CoV-2, NAA: NOT DETECTED

## 2019-08-15 MED FILL — ALPRAZolam 0.5 MG TABS: 0.5 | 20 days supply | Qty: 30 | Fill #1

## 2019-08-15 MED FILL — VALSARTAN-HCTZ 80-12.5 MG T: 80-12.5 | 30 days supply | Qty: 30 | Fill #2

## 2019-08-19 ENCOUNTER — Encounter: Payer: Self-pay | Admitting: Physician Assistant

## 2019-08-19 NOTE — Telephone Encounter (Signed)
Scheduled patient for 08/22/19 @ 8 am

## 2019-08-22 ENCOUNTER — Encounter: Payer: Self-pay | Admitting: Physician Assistant

## 2019-08-22 ENCOUNTER — Other Ambulatory Visit: Payer: Self-pay

## 2019-08-22 ENCOUNTER — Ambulatory Visit (INDEPENDENT_AMBULATORY_CARE_PROVIDER_SITE_OTHER): Payer: No Typology Code available for payment source | Admitting: Physician Assistant

## 2019-08-22 VITALS — BP 110/70 | HR 96 | Temp 97.6°F | Ht 66.0 in | Wt 228.0 lb

## 2019-08-22 DIAGNOSIS — M545 Low back pain: Secondary | ICD-10-CM | POA: Diagnosis not present

## 2019-08-22 DIAGNOSIS — R198 Other specified symptoms and signs involving the digestive system and abdomen: Secondary | ICD-10-CM

## 2019-08-22 DIAGNOSIS — R41840 Attention and concentration deficit: Secondary | ICD-10-CM

## 2019-08-22 DIAGNOSIS — G8929 Other chronic pain: Secondary | ICD-10-CM

## 2019-08-22 DIAGNOSIS — E669 Obesity, unspecified: Secondary | ICD-10-CM

## 2019-08-22 NOTE — Patient Instructions (Addendum)
It was great to see you!  For your back/jaw A referral has been placed for you to see Dr. Lynne Leader with [facility]. Someone from there office will be in touch soon regarding your appointment with him. His location: [place] at [facility] [address] on the 1st floor.   Phone number [phone], Fax [phone].  This location is across the street from the entrance to [employer] and in the same complex as the [facility] and [employer]  ADHD eval I have put in referral for you to be seen at [facility].  Let me know how far out your appointment is with [facility] and we could potentially start [place] in the interim.  Let's follow-up based on our plan, sooner if you have concerns.  Take care,  Inda Coke PA-C

## 2019-08-22 NOTE — Progress Notes (Signed)
Crystal Madden is a 44 y.o. female is here to discuss: Weight gain  I acted as a Education administrator for [employer], PA-C Anselmo Pickler, LPN  History of Present Illness:   Chief Complaint  Patient presents with  . Obesity    HPI   Obesity Pt is here today to discuss weight gain. She was on Phentermine stopped few months ago due to blood pressure. Was focused on Phentermine. Labs were recently checked in May 24, 2019. Has done Weight Watchers in the past, has great knowledge of what she should and should not eat. Feels like she has possibly underlying ADD/ADHD. Sometimes uses foods as a distraction. But also has comfort eating.   Wt Readings from Last 5 Encounters:  08/22/19 228 lb (103.4 kg)  06/14/19 211 lb (95.7 kg)  05/31/19 207 lb 4 oz (94 kg)  05/24/19 207 lb 3.2 oz (94 kg)  08/27/18 207 lb 9.6 oz (94.2 kg)   Body mass index is 36.8 kg/m.  Chronic back pain History of discectomy 2-3 years ago. Recently had flare of pain while packing for a trip. She is aware that her weight gain is affecting her back. Denies: dysuria, saddle anesthesia  Clenching of jaw Has had ongoing issues with clenching her jaw and is hoping to possibly see a specialist. She has flexeril to use prn for her back and clenching of jaw and is wondering if more can be done. Feels jaw tightening throughout the day  There are no preventive care reminders to display for this patient.  Past Medical History:  Diagnosis Date  . Hyperlipidemia   . Obesity (BMI 30-39.9)      Social History   Socioeconomic History  . Marital status: Married    Spouse name: Not on file  . Number of children: Not on file  . Years of education: Not on file  . Highest education level: Not on file  Occupational History  . Occupation: Quality Improvement    Employer: [employer]  Tobacco Use  . Smoking status: Former Smoker    Quit date: 08/27/2004    Years since quitting: 14.9  . Smokeless tobacco: Never Used   Substance and Sexual Activity  . Alcohol use: Yes    Comment: Ocassional.  . Drug use: No  . Sexual activity: Not on file  Other Topics Concern  . Not on file  Social History Narrative  . Not on file   Social Determinants of Health   Financial Resource Strain:   . Difficulty of Paying Living Expenses: Not on file  Food Insecurity:   . Worried About Charity fundraiser in the Last Year: Not on file  . Ran Out of Food in the Last Year: Not on file  Transportation Needs:   . Lack of Transportation (Medical): Not on file  . Lack of Transportation (Non-Medical): Not on file  Physical Activity:   . Days of Exercise per Week: Not on file  . Minutes of Exercise per Session: Not on file  Stress:   . Feeling of Stress : Not on file  Social Connections:   . Frequency of Communication with Friends and Family: Not on file  . Frequency of Social Gatherings with Friends and Family: Not on file  . Attends Religious Services: Not on file  . Active Member of Clubs or Organizations: Not on file  . Attends Archivist Meetings: Not on file  . Marital Status: Not on file  Intimate Partner Violence:   .  Fear of Current or Ex-Partner: Not on file  . Emotionally Abused: Not on file  . Physically Abused: Not on file  . Sexually Abused: Not on file    Past Surgical History:  Procedure Laterality Date  . BLADDER SUSPENSION    . CESAREAN SECTION    . LUMBAR MICRODISCECTOMY  2018  . TONSILLECTOMY  2000    Family History  Problem Relation Age of Onset  . Hypertension Mother   . Hyperlipidemia Mother   . COPD Father   . Cancer Father   . Heart disease Father   . Cancer Maternal Grandfather   . Cancer Paternal Grandmother   . Colon cancer Neg Hx     PMHx, SurgHx, SocialHx, FamHx, Medications, and Allergies were reviewed in the Visit Navigator and updated as appropriate.   Patient Active Problem List   Diagnosis Date Noted  . Hypertension 06/14/2019  . Reactive airway  disease 08/28/2018  . Vitamin D deficiency 08/28/2018  . Class 1 obesity due to excess calories without serious comorbidity with body mass index (BMI) of 34.0 to 34.9 in adult 08/27/2018  . Attention deficit hyperactivity disorder (ADHD), combined type 08/27/2018  . Diverticulitis of sigmoid colon 07/22/2016    Social History   Tobacco Use  . Smoking status: Former Smoker    Quit date: 08/27/2004    Years since quitting: 14.9  . Smokeless tobacco: Never Used  Substance Use Topics  . Alcohol use: Yes    Comment: Ocassional.  . Drug use: No    Current Medications and Allergies:    Current Outpatient Medications:  .  ALPRAZolam (XANAX) 0.5 MG tablet, TAKE 1 TABLET BY MOUTH IN THE AM AND 1/2 TABLET IN THE AFTERNOON (Patient taking differently: as needed. ), Disp: 30 tablet, Rfl: 2 .  bimatoprost (LATISSE) 0.03 % ophthalmic solution, PUT 1 DROP ON APPLICATOR &APPLY EVENLY ALONG SKIN OF UPPER LID AT BASE OF LASHES TO BOTH EYES AT BED, Disp: 3 mL, Rfl: 12 .  cyclobenzaprine (FLEXERIL) 10 MG tablet, TAKE 1 TABLET (10 MG TOTAL) BY MOUTH AT BEDTIME., Disp: 30 tablet, Rfl: 0 .  naproxen (NAPROSYN) 500 MG tablet, Take 1 tablet (500 mg total) by mouth 2 (two) times daily with a meal. (Patient taking differently: Take 500 mg by mouth 2 (two) times daily as needed. ), Disp: 30 tablet, Rfl: 0 .  valsartan-hydrochlorothiazide (DIOVAN-HCT) 80-12.5 MG tablet, Take 1 tablet by mouth daily., Disp: 90 tablet, Rfl: 1   Allergies  Allergen Reactions  . Amlodipine Swelling    Lower leg swelling when increased to 10 mg    Review of Systems   ROS Negative unless otherwise specified per HPI.  Vitals:   Vitals:   08/22/19 0808  BP: 110/70  Pulse: 96  Temp: 97.6 F (36.4 C)  TempSrc: Temporal  SpO2: 99%  Weight: 228 lb (103.4 kg)  Height: 5\' 6"  (1.676 m)     Body mass index is 36.8 kg/m.   Physical Exam:    Physical Exam Vitals and nursing note reviewed.  Constitutional:       General: She is not in acute distress.    Appearance: She is well-developed. She is not ill-appearing or toxic-appearing.  Cardiovascular:     Rate and Rhythm: Normal rate and regular rhythm.     Pulses: Normal pulses.     Heart sounds: Normal heart sounds, S1 normal and S2 normal.     Comments: No LE edema Pulmonary:     Effort: Pulmonary effort  is normal.     Breath sounds: Normal breath sounds.  Skin:    General: Skin is warm and dry.  Neurological:     Mental Status: She is alert.     GCS: GCS eye subscore is 4. GCS verbal subscore is 5. GCS motor subscore is 6.  Psychiatric:        Speech: Speech normal.        Behavior: Behavior normal. Behavior is cooperative.      Assessment and Plan:    Hedi was seen today for obesity.  Diagnoses and all orders for this visit:  Clenching of teeth Referral to Dr. Georgina Snell. -     Ambulatory referral to Sports Medicine  Attention or concentration deficit Referral to [facility]. -     Ambulatory referral to Psychiatry  Chronic bilateral low back pain, unspecified whether sciatica present No red flags on discussion. Referral to Dr. Georgina Snell.  -     Ambulatory referral to Sports Medicine  Obesity, unspecified classification, unspecified obesity type, unspecified whether serious comorbidity present Discussed Saxenda and provided patient information. Will possibly start this -- for now she is going to see how long it takes to get in to the see [facility] so hopefully we only start one medication at a time.  Reviewed expectations re: course of current medical issues. Discussed self-management of symptoms. Outlined signs and symptoms indicating need for more acute intervention. Patient verbalized understanding and all questions were answered. See orders for this visit as documented in the electronic medical record. Patient received an After Visit Summary.  CMA or LPN served as scribe during this  visit. History, Physical, and Plan performed by medical provider. The above documentation has been reviewed and is accurate and complete.  Inda Coke, PA-C [facility] 08/22/2019  Follow-up: No follow-ups on file.

## 2019-08-25 ENCOUNTER — Encounter: Payer: Self-pay | Admitting: Family Medicine

## 2019-08-25 ENCOUNTER — Ambulatory Visit (INDEPENDENT_AMBULATORY_CARE_PROVIDER_SITE_OTHER): Payer: No Typology Code available for payment source

## 2019-08-25 ENCOUNTER — Other Ambulatory Visit: Payer: Self-pay

## 2019-08-25 ENCOUNTER — Ambulatory Visit (INDEPENDENT_AMBULATORY_CARE_PROVIDER_SITE_OTHER): Payer: No Typology Code available for payment source | Admitting: Family Medicine

## 2019-08-25 VITALS — BP 148/90 | HR 101 | Ht 66.0 in | Wt 233.2 lb

## 2019-08-25 DIAGNOSIS — M5442 Lumbago with sciatica, left side: Secondary | ICD-10-CM | POA: Diagnosis not present

## 2019-08-25 DIAGNOSIS — M25552 Pain in left hip: Secondary | ICD-10-CM | POA: Diagnosis not present

## 2019-08-25 IMAGING — DX DG HIP (WITH OR WITHOUT PELVIS) 2-3V*L*
2 series · 2 of 2 positions shown · non-contrast
Comparison: None.

CLINICAL DATA: Pain

EXAM:
DG HIP  2-3V LEFT

[hip ap]
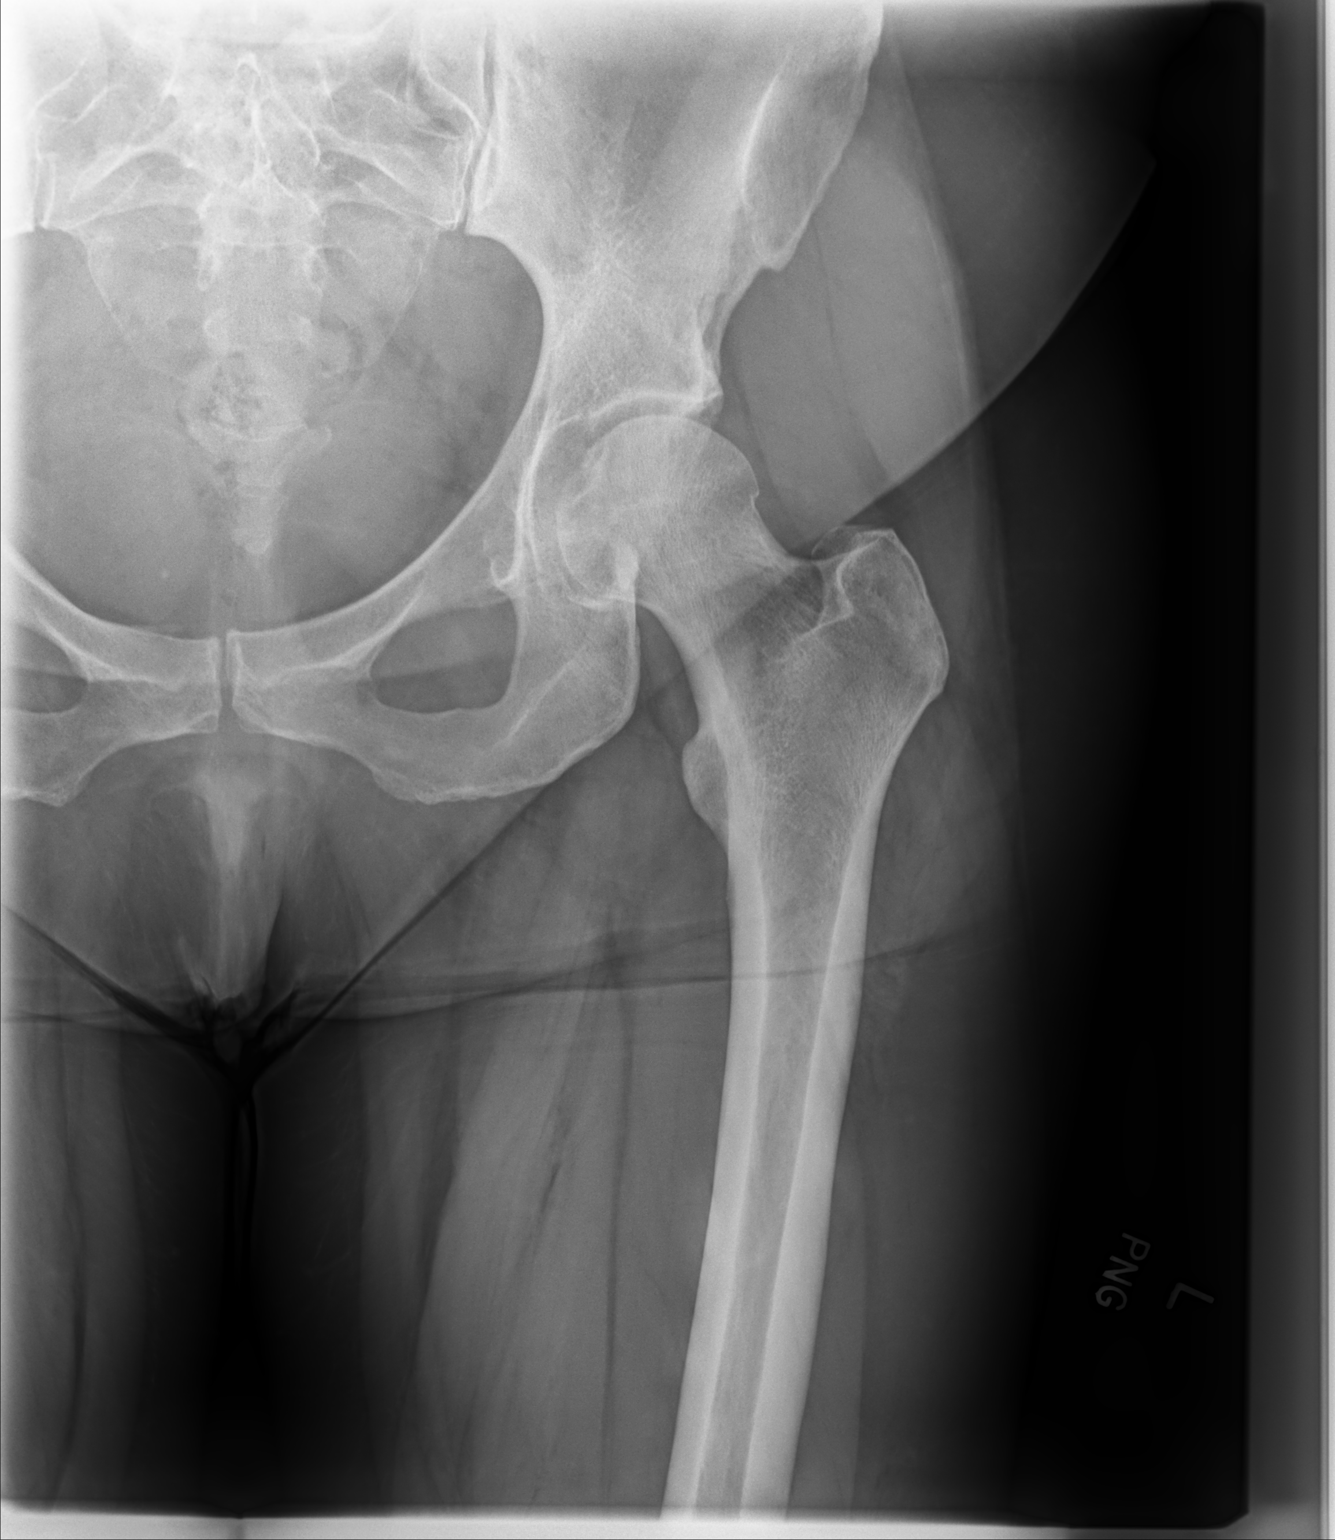

[hip (frog leg)]
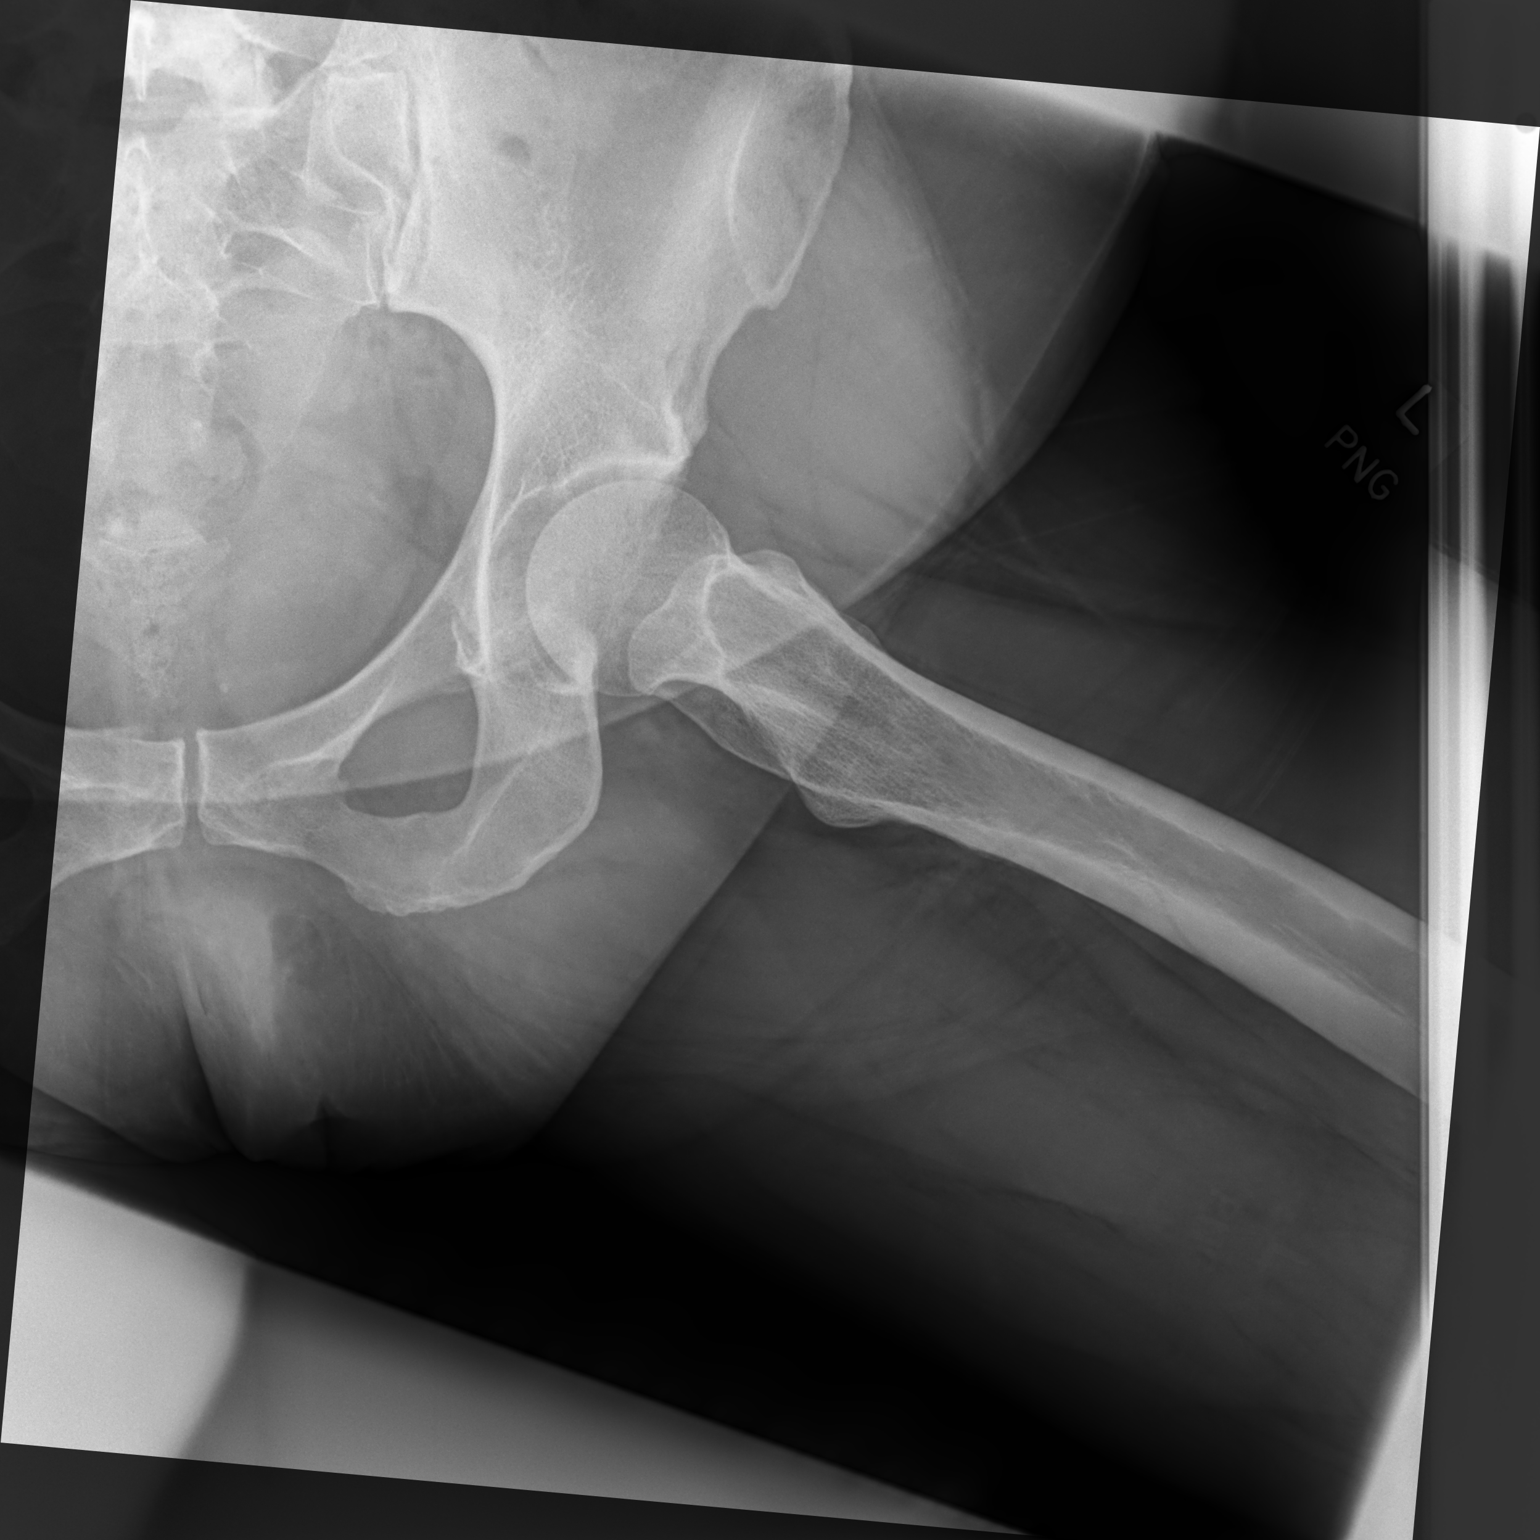

[2 of 2 positions shown; findings below may reference images not displayed]

FINDINGS: Frontal and lateral views were obtained. No fracture or dislocation.
There is slight disc space narrowing along the superior aspect of
the joint, particularly noted on the lateral view. The joint spaces
appear normal. No appreciable acetabular bony overgrowth. No
intra-articular calcification or erosion.
IMPRESSION: Slight narrowing along the superior aspect of the hip joint, best
appreciated on the lateral view. No associated bony overgrowth in
this area. There may be a degree of impingement in part due to the
narrowing of the joint in this area. No erosive change. No fracture
or dislocation.

## 2019-08-25 IMAGING — DX DG LUMBAR SPINE COMPLETE 4+V
5 series · 5 of 5 positions shown · non-contrast
Comparison: Lumbar MRI [DATE]

CLINICAL DATA: Pain with concern for radicular symptoms on the left

EXAM:
LUMBAR SPINE - COMPLETE 4+ VIEW

[lumbar spine ap]
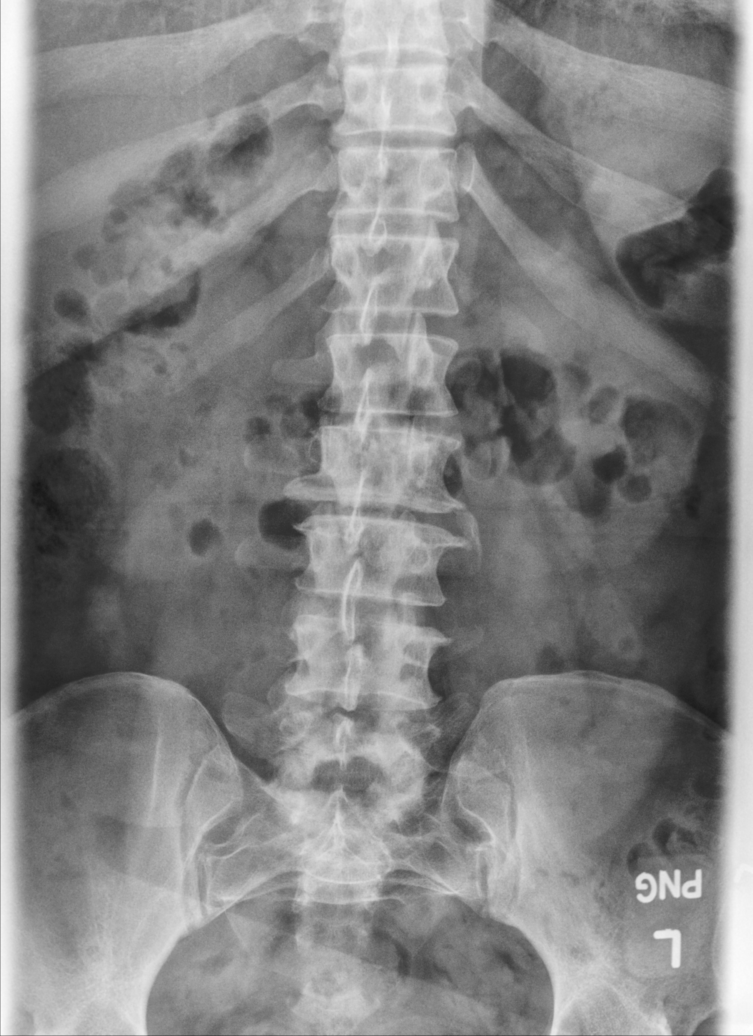

[lumbar spine mlo (1 of 2)]
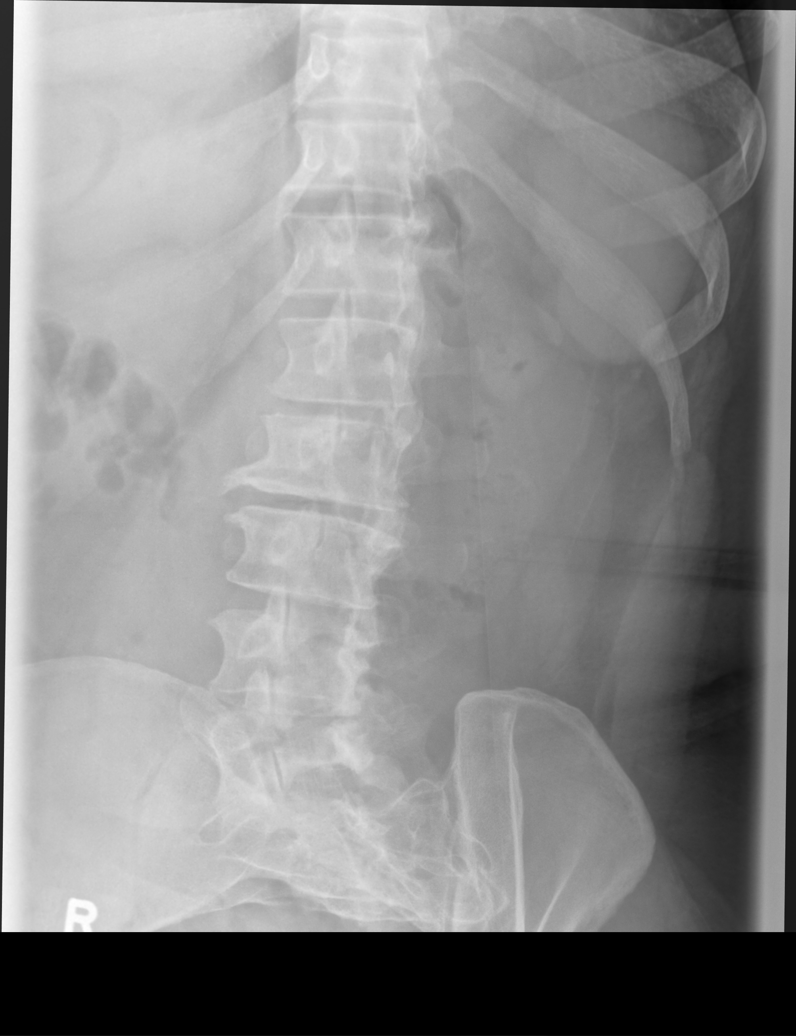

[lumbar spine mlo (2 of 2)]
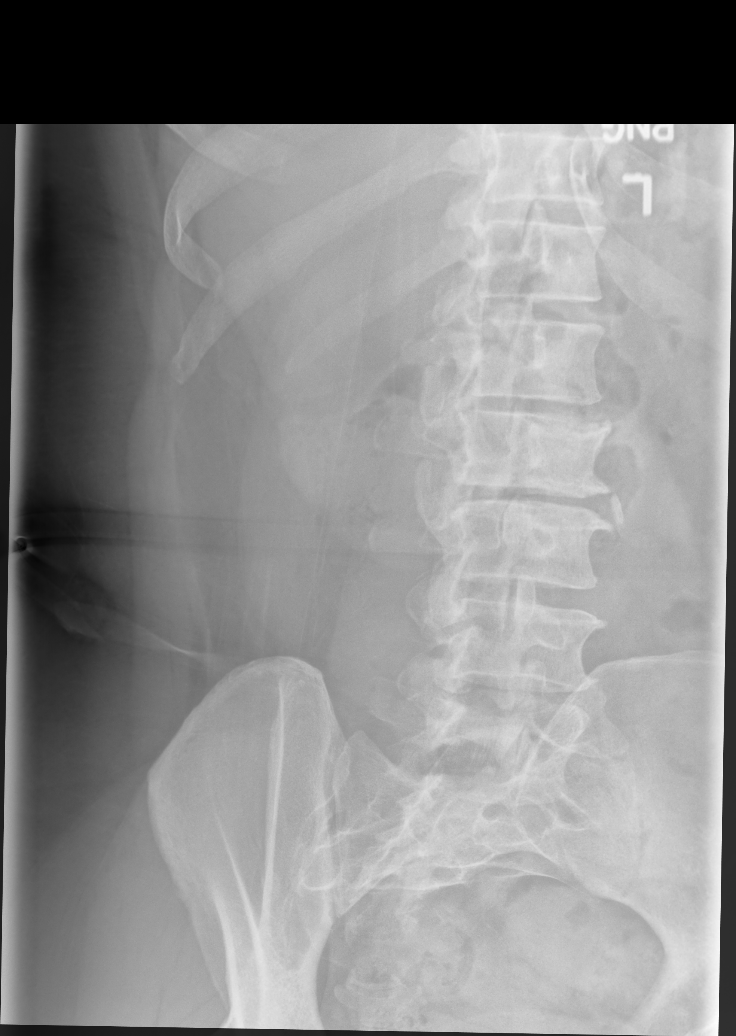

[lumbar spine lat (1 of 2)]
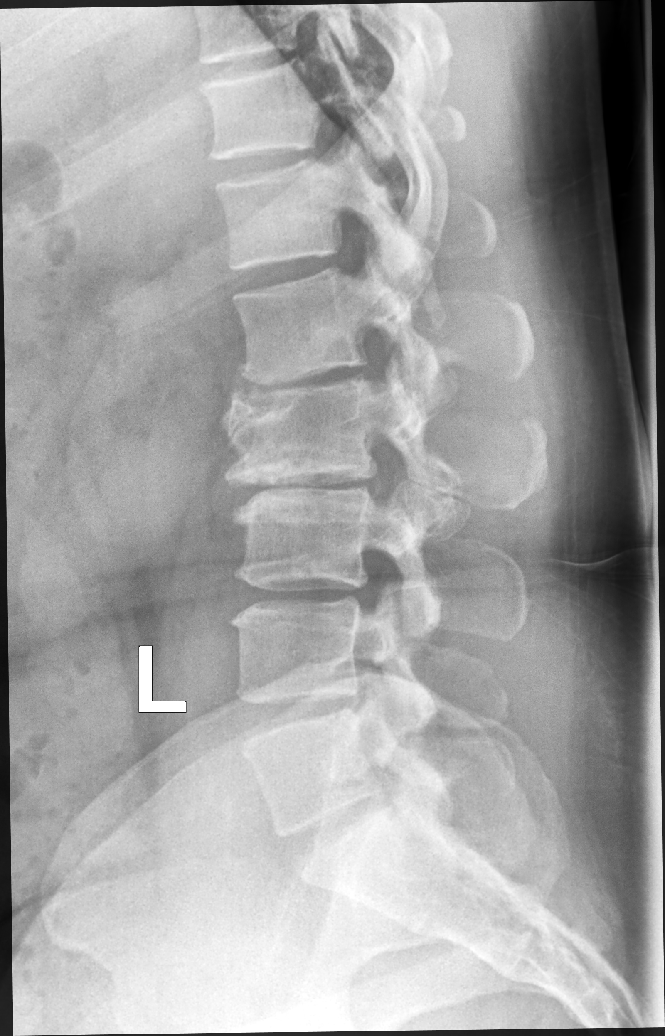

[lumbar spine lat (2 of 2)]
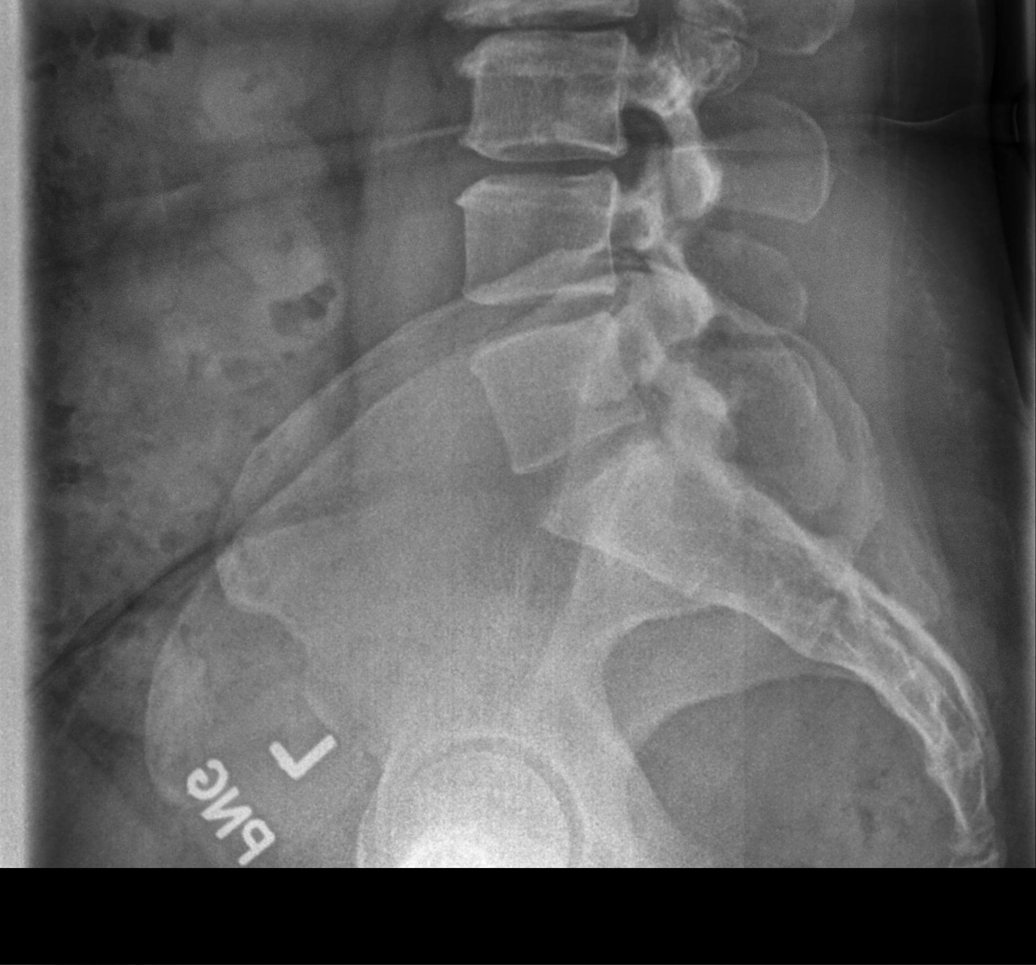

[5 of 5 positions shown; findings below may reference images not displayed]

FINDINGS: Frontal, lateral, spot lumbosacral lateral, and bilateral oblique
views were obtained. There are 5 non-rib-bearing lumbar type
vertebral bodies. Note that there is a partial assimilation joint on
the left at L5. There is no acute fracture or spondylolisthesis.
Cortical irregularity along the anterior aspect of the L2 vertebral
body appears chronic and likely indicative of old trauma in this
area. There is moderately severe disc space narrowing at L1-2 and
L2-3 with moderate disc space narrowing at L3-4. There is mild facet
osteoarthritic change at L2-3 and L3-4 bilaterally. No erosive
change.
IMPRESSION: Question old trauma involving the anterior aspect of the L2
vertebral body. No acute fracture. No spondylolisthesis.
Osteoarthritic change at several levels noted.

## 2019-08-25 MED ORDER — BACLOFEN 10 MG PO TABS: 10.0000 mg | ORAL_TABLET | Freq: Three times a day (TID) | ORAL | 1 refills | Status: DC | PRN

## 2019-08-25 MED ORDER — GABAPENTIN 300 MG PO CAPS: 300.0000 mg | ORAL_CAPSULE | Freq: Three times a day (TID) | ORAL | 1 refills | Status: DC | PRN

## 2019-08-25 MED FILL — GABAPENTIN 300 MG CAPSULE: 300 | 30 days supply | Qty: 90 | Fill #0

## 2019-08-25 MED FILL — BACLOFEN 10 MG TABS: 10 | 15 days supply | Qty: 90 | Fill #0

## 2019-08-25 NOTE — Progress Notes (Signed)
X-ray L-spine does show degenerative changes and disc narrowing at L1-L2 and L2-3.  A pinched nerve at this level could cause anterior hip pain like you have.  If not improving with physical therapy repeat MRI may be very helpful.

## 2019-08-25 NOTE — Progress Notes (Signed)
X-ray hip shows mild arthritis.  No significant impingement seen on x-ray.

## 2019-08-25 NOTE — Patient Instructions (Signed)
Thank you for coming in today. Get xrays now.  Attend PT.  Try gabapentin for nerve pain mostly at night.   Try backofen most at bedtime for jaw clenching.  Talk to your dentist about a bite guard.   Recheck in 1 month.  Return sooner if needed.  Let me know if you are having a problem.

## 2019-08-25 NOTE — Progress Notes (Signed)
Subjective:    I'm seeing this patient as a consultation for:  Len Blalock, [place]. Note will be routed back to referring provider/PCP.  CC: Low back pain w/ L LE pain and jaw clenching/fatigue  I, Molly Weber, LAT, ATC, am serving as scribe for Dr. Lynne Leader.  HPI: Pt is a 44 y/o female presenting w/ c/o reoccurring low back pain, left anterior hip pain, left lateral hip pain..  Pt has a hx of a lumbar discectomy 2-3 years ago w/ Dr. Saintclair Halsted at [facility] and notes that her back pain recently flared while packing for a trip.  She rates the pain at a  2/10 at rest and a 6-7/10 at it's worst and describes her pain as sharp in her L hip and electrical pain along her L lateral thigh and lower leg and tightness in her lower back.  Anterior hip pain is worse with forward flexion and hip flexion.  Aggravating factors include hip/trunk flexion for the L hip and prolonged standing and sitting.  She does have numbness/tingling in her L LE.  She has been taking Flexeril and Naprosyn 500mg  bid.  She tried a prednisone taper which helped for a few days.  Jaw clenching/fatigue:  Jaw clenching and fatigue  x 6-8 months w/ no known MOI.  She notes fatigue w/ chewing.  She also notes tightness along her ant neck musculature.  She takes Flexeril which does help w/ her symptoms.  She has not tried a bite guard yet.  Symptoms are bilateral.  No temporal pain or headache.  Past medical history, Surgical history, Family history, Social history, Allergies, and medications have been entered into the medical record, reviewed.   Review of Systems: No new headache, visual changes, nausea, vomiting, diarrhea, constipation, dizziness, abdominal pain, skin rash, fevers, chills, night sweats, weight loss, swollen lymph nodes, body aches, joint swelling, muscle aches, chest pain, shortness of breath, mood changes, visual or auditory hallucinations.   Objective:    Vitals:   08/25/19 1053  BP: (!) 148/90  Pulse: (!)  101  SpO2: 98%   General: Well Developed, well nourished, and in no acute distress.  Neuro/Psych: Alert and oriented x3, extra-ocular muscles intact, able to move all 4 extremities, sensation grossly intact. Skin: Warm and dry, no rashes noted.  Respiratory: Not using accessory muscles, speaking in full sentences, trachea midline.  Cardiovascular: Pulses palpable, no extremity edema. Abdomen: Does not appear distended. MSK:  L-spine: Normal-appearing Nontender spinal midline. Normal lumbar motion. Lower extremity strength equal normal throughout bilateral extremities. Reflexes equal and normal bilateral lower extremities. Sensation is intact throughout.   Left hip: Normal-appearing Normal motion some pain with internal rotation and flexion.  Pain located at anterior hip. Hip is nontender. Normal strength. Unable to reproduce lateral hip pain with strength testing or with palpation overlying ASIS.  Right hip normal-appearing Normal motion. Normal strength. Nontender.  Lab and Radiology Results  X-ray images L-spine and left hip obtained today personally and independently reviewed  L-spine: DDD at L1-2, and L2-3  Left hip: Mild DJD.  No severe cam or pincer deformity.  No avascular necrosis changes.  Await formal radiology review  Impression and Recommendations:    Assessment and Plan: 44 y.o. female with multifactorial pain.  Low back pain: Patient has DDD changes seen on x-ray today.  Exam consistent with myofascial dysfunction and spasm.  Plan for trial of physical therapy.  Left anterior hip pain: Femoral acetabular impingement or other intra-articular hip pain because versus  lumbar radiculopathy at L1 or L2.  Patient certainly has changes seen on x-ray at these levels.  Physical exam more consistent with hip impingement.  Again proceed with trial of physical therapy.  Additionally will add gabapentin.  Left lower leg pain: Lumbar radiculopathy likely S1 or L5 nerve  root.  Patient had history of surgery at these levels.  Plan for gabapentin and physical therapy as well.  Jaw clenching: Bruxism.  Discussed options.  Most benefit will come from bite guard.  Recommend discussed with dentist.  Additionally will switch up muscle lectures and try baclofen as this may be a little bit more effective.  Recheck in 1 month.  PDMP not reviewed this encounter. Orders Placed This Encounter  Procedures  . DG Hip Unilat W OR W/O Pelvis 2-3 Views Left    Standing Status:   Future    Number of Occurrences:   1    Standing Expiration Date:   10/22/2020    Order Specific Question:   Reason for Exam (SYMPTOM  OR DIAGNOSIS REQUIRED)    Answer:   eval anterior hip pain. ?FAI?    Order Specific Question:   Is patient pregnant?    Answer:   No    Order Specific Question:   Preferred imaging location?    Answer:   [facility]    Order Specific Question:   Radiology Contrast Protocol - do NOT remove file path    Answer:   [email or web address]  . DG Lumbar Spine Complete    Standing Status:   Future    Number of Occurrences:   1    Standing Expiration Date:   10/22/2020    Order Specific Question:   Reason for Exam (SYMPTOM  OR DIAGNOSIS REQUIRED)    Answer:   eval low back pain and poss L5 S1 radic. Also anterior hip pain    Order Specific Question:   Is patient pregnant?    Answer:   No    Order Specific Question:   Preferred imaging location?    Answer:   [facility]    Order Specific Question:   Radiology Contrast Protocol - do NOT remove file path    Answer:   \\[email or web address]  . Ambulatory referral to Physical Therapy    Referral Priority:   Routine    Referral Type:   Physical Medicine    Referral Reason:   Specialty Services Required    Requested Specialty:   Physical Therapy   Meds ordered this encounter  Medications  . gabapentin (NEURONTIN) 300 MG capsule     Sig: Take 1 capsule (300 mg total) by mouth 3 (three) times daily as needed (nerve pain).    Dispense:  90 capsule    Refill:  1  . baclofen (LIORESAL) 10 MG tablet    Sig: Take 1-2 tablets (10-20 mg total) by mouth 3 (three) times daily as needed for muscle spasms.    Dispense:  90 each    Refill:  1    Discussed warning signs or symptoms. Please see discharge instructions. Patient expresses understanding.  The above documentation has been reviewed and is accurate and complete Lynne Leader

## 2019-08-29 MED FILL — NORLYDA 0.35 MG TABS: 0.35 | 84 days supply | Qty: 84 | Fill #1

## 2019-09-01 ENCOUNTER — Encounter: Payer: Self-pay | Admitting: *Deleted

## 2019-09-01 ENCOUNTER — Encounter: Payer: Self-pay | Admitting: Physician Assistant

## 2019-09-02 ENCOUNTER — Telehealth: Payer: Self-pay | Admitting: Family Medicine

## 2019-09-02 MED ORDER — HYDROCODONE-ACETAMINOPHEN 5-325 MG PO TABS: 1.0000 | ORAL_TABLET | Freq: Four times a day (QID) | ORAL | 0 refills | Status: DC | PRN

## 2019-09-02 MED FILL — HYDROCODON-APAP 5-325: 5-325 | 4 days supply | Qty: 15 | Fill #0

## 2019-09-02 NOTE — Telephone Encounter (Signed)
Hydrocodone prescribed to [facility] outpatient pharmacy.  Take sparingly for severe pain.  Recheck if not improving.

## 2019-09-02 NOTE — Telephone Encounter (Signed)
Patient called stating that she is scheduled for her visit physical therapy visit on 09/06/2019 but is in a lot of pain. She wanted to know if Dr Georgina Snell would consider prescribing her pain medication to help. Please advise.  Pharmacy: [facility]

## 2019-09-02 NOTE — Telephone Encounter (Signed)
Called and informed pt that hydrocodone rx has been sent to [place]

## 2019-09-06 ENCOUNTER — Encounter: Payer: Self-pay | Admitting: Physical Therapy

## 2019-09-06 ENCOUNTER — Other Ambulatory Visit: Payer: Self-pay

## 2019-09-06 ENCOUNTER — Ambulatory Visit (INDEPENDENT_AMBULATORY_CARE_PROVIDER_SITE_OTHER): Payer: No Typology Code available for payment source | Admitting: Physical Therapy

## 2019-09-06 DIAGNOSIS — M25552 Pain in left hip: Secondary | ICD-10-CM | POA: Diagnosis not present

## 2019-09-06 DIAGNOSIS — M545 Low back pain, unspecified: Secondary | ICD-10-CM

## 2019-09-06 DIAGNOSIS — M25551 Pain in right hip: Secondary | ICD-10-CM | POA: Diagnosis not present

## 2019-09-06 DIAGNOSIS — G8929 Other chronic pain: Secondary | ICD-10-CM

## 2019-09-06 NOTE — Therapy (Signed)
[facility] [address] [place], [place], [address] Phone: [phone]   Fax:  [phone]  Physical Therapy Evaluation  Patient Details  Name: Crystal Madden MRN: [id number] Date of Birth: 14-May-1976 Referring Provider (PT): Lynne Leader   Encounter Date: 09/06/2019  PT End of Session - 09/06/19 1223    Visit Number  1    Number of Visits  12    Date for PT Re-Evaluation  10/18/19    Authorization Type  [facility]    PT Start Time  1017    PT Stop Time  1058    PT Time Calculation (min)  41 min    Activity Tolerance  Patient tolerated treatment well    Behavior During Therapy  [facility] for tasks assessed/performed       Past Medical History:  Diagnosis Date  . Hyperlipidemia   . Obesity (BMI 30-39.9)     Past Surgical History:  Procedure Laterality Date  . BLADDER SUSPENSION    . CESAREAN SECTION    . LUMBAR MICRODISCECTOMY  2018  . TONSILLECTOMY  2000    There were no vitals filed for this visit.   Subjective Assessment - 09/06/19 1023    Subjective  No new injury to start pain. Pt noted increased pain in last few months. She notes back pain, and pain now into Bil hips, deep in hips. She had discectomy surg in 2018 /[facility], with good outcome, but feels pain is the same as it was then. She is very active at home, has 3 kids. Sits at computer for work. Not exercising much at this time.    Limitations  Sitting;Standing;Walking;House hold activities    Patient Stated Goals  Decreaesd pain    Currently in Pain?  Yes    Pain Score  7     Pain Location  Back    Pain Orientation  Right;Left    Pain Descriptors / Indicators  Aching    Pain Type  Chronic pain    Pain Radiating Towards  BIl Hips    Pain Onset  More than a month ago    Pain Frequency  Intermittent    Aggravating Factors   standing, walking. bending.    Pain Relieving Factors  laying flat         [facility] PT Assessment - 09/06/19 0001      Assessment   Medical Diagnosis  Back Pain    Referring Provider (PT)  Lynne Leader    Prior Therapy  prior to back surgery 2018      Balance Screen   Has the patient fallen in the past 6 months  No      Prior Function   Level of Independence  Independent      Cognition   Overall Cognitive Status  Within Functional Limits for tasks assessed      Posture/Postural Control   Posture Comments  Standing: L hip lower, mild rotation to L/post,  mild increase in lordosis in standing       ROM / Strength   AROM / PROM / Strength  AROM;Strength      AROM   Overall AROM Comments  Hips: WNL bil;     AROM Assessment Site  Lumbar    Lumbar Flexion  60  Flexion=pain in hips     Lumbar Extension  mod llimitation/pain    Lumbar - Right Side Bend  wfl    Lumbar - Left Side Bend  wfl  Strength   Overall Strength Comments  Core: 3/5, Hips: 4+/5       Palpation   Palpation comment  Minimal pain to palpate in hips, no tenderness at greater troch/hipflexor, lateral hip or glute. Mild pain in bil SI region and central Low lumbar region      Special Tests   Other special tests  Neg SLR, Neg Fabir/Fadir;  Hip distraction confortable                Objective measurements completed on examination: See above findings.      [facility] Adult PT Treatment/Exercise - 09/06/19 0001      Exercises   Exercises  Lumbar      Lumbar Exercises: Stretches   Single Knee to Chest Stretch  3 reps;30 seconds    Piriformis Stretch  2 reps;30 seconds    Piriformis Stretch Limitations  supine fig 4 ;       Lumbar Exercises: Standing   Functional Squats  10 reps    Functional Squats Limitations  with education on posture/mechanics.       Lumbar Exercises: Seated   Sit to Stand  10 reps    Sit to Stand Limitations  with education on posture/mechanics.       Lumbar Exercises: Supine   Ab Set  15 reps    Pelvic Tilt  15 reps             PT Education - 09/06/19 1223    Education Details  PT POC, HEP ,  Exam findings.    Person(s) Educated  Patient    Methods  Explanation;Demonstration;Tactile cues;Verbal cues;Handout    Comprehension  Verbalized understanding;Returned demonstration;Verbal cues required;Need further instruction       PT Short Term Goals - 09/06/19 1232      PT SHORT TERM GOAL #1   Title  Pt to be independent with initial HEP    Time  2    Period  Weeks    Target Date  09/20/19      PT SHORT TERM GOAL #2   Title  Pt to report decreased pain in bil hips to 0-2/10 with bending motion.    Time  3    Period  Weeks    Status  New    Target Date  09/27/19        PT Long Term Goals - 09/06/19 1233      PT LONG TERM GOAL #1   Title  Pt to demo ability for full lumbar ROM/ WNL, and pain free    Time  6    Period  Weeks    Status  New    Target Date  10/18/19      PT LONG TERM GOAL #2   Title  Pt to be independent with final HEP    Time  6    Period  Weeks    Status  New    Target Date  10/18/19      PT LONG TERM GOAL #3   Title  Pt to report decreased pain in back and bil hips, to 0-2/10 with bending, and activity, to improve ability for IADLS.    Time  6    Period  Weeks    Status  New    Target Date  10/18/19      PT LONG TERM GOAL #4   Title  Pt to demo optimal mechanics for bend, lift, squat, to improve ability and pain wiht functional activity  Time  6    Period  Weeks    Status  New    Target Date  10/18/19      PT LONG TERM GOAL #5   Title  Pt to demo improved core stabilization to be [facility] for pt age, wiht no/minimal postural changes with strengthening exercises.    Time  6    Period  Weeks    Status  New    Target Date  10/18/19             Plan - 09/06/19 1237    Clinical Impression Statement  Pt presents with primary complaint of increased pain in low back, referred into bil hips. Pt with poor mechanics with bending, squatting and with functional activity. Pt with poor core stabilization, and lack of effective HEP for dx. She  has decreased ability for full lumbar ROM, with increased pain wiht flexion and extension. Pt with decreased ability for full functional activities, due to pain. Pt to benefit from skilled PT to improve.    Personal Factors and Comorbidities  Fitness    Examination-Activity Limitations  Locomotion Level;Transfers;Bend;Squat;Lift;Stand    Examination-Participation Restrictions  Meal Prep;Cleaning;Community Activity;Driving;Dorita Sciara    Stability/Clinical Decision Making  Stable/Uncomplicated    Clinical Decision Making  Low    Rehab Potential  Good    PT Frequency  2x / week    PT Duration  6 weeks    PT Treatment/Interventions  ADLs/Self Care Home Management;Cryotherapy;Electrical Stimulation;DME Instruction;Ultrasound;Traction;Moist Heat;Iontophoresis 4mg /ml Dexamethasone;Gait training;Stair training;Functional mobility training;Therapeutic activities;Therapeutic exercise;Balance training;Neuromuscular re-education;Manual techniques;Patient/family education;Passive range of motion;Dry needling;Joint Manipulations;Spinal Manipulations;Taping    Consulted and Agree with Plan of Care  Patient       Patient will benefit from skilled therapeutic intervention in order to improve the following deficits and impairments:  Decreased range of motion, Increased muscle spasms, Decreased activity tolerance, Pain, Improper body mechanics, Impaired flexibility, Decreased mobility, Decreased strength  Visit Diagnosis: Chronic bilateral low back pain, unspecified whether sciatica present  Pain in left hip  Pain in right hip     Problem List Patient Active Problem List   Diagnosis Date Noted  . Hypertension 06/14/2019  . Reactive airway disease 08/28/2018  . Vitamin D deficiency 08/28/2018  . Class 1 obesity due to excess calories without serious comorbidity with body mass index (BMI) of 34.0 to 34.9 in adult 08/27/2018  . Attention deficit hyperactivity disorder (ADHD), combined type  08/27/2018  . Diverticulitis of sigmoid colon 07/22/2016    Lyndee Hensen, PT, DPT 12:41 PM  09/06/19    [facility] [place], [place], [address] Phone: [phone]   Fax:  [phone]  Name: Crystal Madden MRN: [id number] Date of Birth: 1976/02/06

## 2019-09-06 NOTE — Patient Instructions (Signed)
Access Code: [email or web address]  URL: [email or web address]  Date: 09/06/2019  Prepared by: Lyndee Hensen   Exercises Supine Single Knee to Chest - 3 reps - 30 hold - 2x daily Supine Pelvic Tilt - 10 reps - 2 sets - 2x daily Hooklying Transversus Abdominis Palpation - 10 reps - 2 sets - 2x daily Sit to Stand - 10 reps - 1 sets - 1x daily

## 2019-09-08 ENCOUNTER — Other Ambulatory Visit: Payer: Self-pay

## 2019-09-08 ENCOUNTER — Ambulatory Visit: Payer: No Typology Code available for payment source | Admitting: Physical Therapy

## 2019-09-08 DIAGNOSIS — M25551 Pain in right hip: Secondary | ICD-10-CM | POA: Diagnosis not present

## 2019-09-08 DIAGNOSIS — M25552 Pain in left hip: Secondary | ICD-10-CM

## 2019-09-08 DIAGNOSIS — M545 Low back pain: Secondary | ICD-10-CM | POA: Diagnosis not present

## 2019-09-08 DIAGNOSIS — G8929 Other chronic pain: Secondary | ICD-10-CM | POA: Diagnosis not present

## 2019-09-11 ENCOUNTER — Encounter: Payer: Self-pay | Admitting: Physical Therapy

## 2019-09-11 NOTE — Therapy (Signed)
[facility] [address] [place], [place], [address] Phone: [phone]   Fax:  [phone]  Physical Therapy Treatment  Patient Details  Name: Crystal Madden MRN: [id number] Date of Birth: 1976-07-23 Referring Provider (PT): Lynne Leader   Encounter Date: 09/08/2019  PT End of Session - 09/11/19 1441    Visit Number  2    Number of Visits  12    Date for PT Re-Evaluation  10/18/19    Authorization Type  [facility]    PT Start Time  [id number]    PT Stop Time  1558    PT Time Calculation (min)  42 min    Activity Tolerance  Patient tolerated treatment well    Behavior During Therapy  [facility] for tasks assessed/performed       Past Medical History:  Diagnosis Date  . Hyperlipidemia   . Obesity (BMI 30-39.9)     Past Surgical History:  Procedure Laterality Date  . BLADDER SUSPENSION    . CESAREAN SECTION    . LUMBAR MICRODISCECTOMY  2018  . TONSILLECTOMY  2000    There were no vitals filed for this visit.  Subjective Assessment - 09/11/19 1441    Subjective  Pt states she has been trying to improve posture at home. Feels pain in hips with lumbar flexion/bending.    Currently in Pain?  Yes    Pain Score  5     Pain Location  Back    Pain Orientation  Right;Left    Pain Descriptors / Indicators  Aching    Pain Type  Chronic pain    Pain Onset  More than a month ago    Pain Frequency  Intermittent                       [facility] Adult PT Treatment/Exercise - 09/11/19 1437      Posture/Postural Control   Posture Comments  Standing: L hip lower, mild rotation to L/post,  mild increase in lordosis in standing       Exercises   Exercises  Lumbar      Lumbar Exercises: Stretches   Single Knee to Chest Stretch  3 reps;30 seconds    Piriformis Stretch  2 reps;30 seconds    Piriformis Stretch Limitations  supine fig 4 ;     Other Lumbar Stretch Exercise  Hip Flexor stretch kneeling 30 sec x3 bil;       Lumbar  Exercises: Aerobic   Stationary Bike  L2 x 7 min;       Lumbar Exercises: Standing   Functional Squats  10 reps    Functional Squats Limitations  with education on posture/mechanics.       Lumbar Exercises: Supine   Ab Set  15 reps    Pelvic Tilt  15 reps    Bent Knee Raise  20 reps    Bridge  20 reps    Straight Leg Raise  20 reps      Lumbar Exercises: Prone   Other Prone Lumbar Exercises  Prone press ups x15;       Manual Therapy   Manual Therapy  Joint mobilization;Soft tissue mobilization;Manual Traction;Passive ROM    Joint Mobilization  HIp inf glides with strap, Post mobs (supine), and ant mobs (prone)     Passive ROM  repeated motions for hip flexion    Manual Traction  long leg distraction for bil hips x2 min each  PT Short Term Goals - 09/06/19 1232      PT SHORT TERM GOAL #1   Title  Pt to be independent with initial HEP    Time  2    Period  Weeks    Target Date  09/20/19      PT SHORT TERM GOAL #2   Title  Pt to report decreased pain in bil hips to 0-2/10 with bending motion.    Time  3    Period  Weeks    Status  New    Target Date  09/27/19        PT Long Term Goals - 09/06/19 1233      PT LONG TERM GOAL #1   Title  Pt to demo ability for full lumbar ROM/ WNL, and pain free    Time  6    Period  Weeks    Status  New    Target Date  10/18/19      PT LONG TERM GOAL #2   Title  Pt to be independent with final HEP    Time  6    Period  Weeks    Status  New    Target Date  10/18/19      PT LONG TERM GOAL #3   Title  Pt to report decreased pain in back and bil hips, to 0-2/10 with bending, and activity, to improve ability for IADLS.    Time  6    Period  Weeks    Status  New    Target Date  10/18/19      PT LONG TERM GOAL #4   Title  Pt to demo optimal mechanics for bend, lift, squat, to improve ability and pain wiht functional activity    Time  6    Period  Weeks    Status  New    Target Date  10/18/19      PT  LONG TERM GOAL #5   Title  Pt to demo improved core stabilization to be [facility] for pt age, wiht no/minimal postural changes with strengthening exercises.    Time  6    Period  Weeks    Status  New    Target Date  10/18/19            Plan - 09/11/19 1443    Clinical Impression Statement  Manual done today for hip mobilization and pain relief. Ther ex progressed for strengthening. Pt with no hip pain with proper hip hinge, squat motion today . Plan to progress strength and continue posture/movment mechanics education    Personal Factors and Comorbidities  Fitness    Examination-Activity Limitations  Locomotion Level;Transfers;Bend;Squat;Lift;Stand    Examination-Participation Restrictions  Meal Prep;Cleaning;Community Activity;Driving;Valla Leaver [facility]    Stability/Clinical Decision Making  Stable/Uncomplicated    Rehab Potential  Good    PT Frequency  2x / week    PT Duration  6 weeks    PT Treatment/Interventions  ADLs/Self Care Home Management;Cryotherapy;Electrical Stimulation;DME Instruction;Ultrasound;Traction;Moist Heat;Iontophoresis 4mg /ml Dexamethasone;Gait training;Stair training;Functional mobility training;Therapeutic activities;Therapeutic exercise;Balance training;Neuromuscular re-education;Manual techniques;Patient/family education;Passive range of motion;Dry needling;Joint Manipulations;Spinal Manipulations;Taping    Consulted and Agree with Plan of Care  Patient       Patient will benefit from skilled therapeutic intervention in order to improve the following deficits and impairments:  Decreased range of motion, Increased muscle spasms, Decreased activity tolerance, Pain, Improper body mechanics, Impaired flexibility, Decreased mobility, Decreased strength  Visit Diagnosis: Chronic bilateral low back pain, unspecified whether sciatica present  Pain in left hip  Pain in right hip     Problem List Patient Active Problem List   Diagnosis Date Noted  . Hypertension  06/14/2019  . Reactive airway disease 08/28/2018  . Vitamin D deficiency 08/28/2018  . Class 1 obesity due to excess calories without serious comorbidity with body mass index (BMI) of 34.0 to 34.9 in adult 08/27/2018  . Attention deficit hyperactivity disorder (ADHD), combined type 08/27/2018  . Diverticulitis of sigmoid colon 07/22/2016    Lyndee Hensen, PT, DPT 2:57 PM  09/11/19    [facility] [place], [place], [address] Phone: [phone]   Fax:  [phone]  Name: Crystal Madden MRN: [id number] Date of Birth: August 31, 1975

## 2019-09-12 MED FILL — VALSARTAN-HCTZ 80-12.5 MG T: 80-12.5 | 30 days supply | Qty: 30 | Fill #3

## 2019-09-13 ENCOUNTER — Encounter: Payer: No Typology Code available for payment source | Admitting: Physical Therapy

## 2019-09-14 ENCOUNTER — Encounter: Payer: Self-pay | Admitting: Physician Assistant

## 2019-09-14 ENCOUNTER — Encounter: Payer: Self-pay | Admitting: Physical Therapy

## 2019-09-14 ENCOUNTER — Ambulatory Visit: Payer: No Typology Code available for payment source | Admitting: Physical Therapy

## 2019-09-14 ENCOUNTER — Other Ambulatory Visit: Payer: Self-pay

## 2019-09-14 DIAGNOSIS — M545 Low back pain, unspecified: Secondary | ICD-10-CM

## 2019-09-14 DIAGNOSIS — G8929 Other chronic pain: Secondary | ICD-10-CM | POA: Diagnosis not present

## 2019-09-14 DIAGNOSIS — M25551 Pain in right hip: Secondary | ICD-10-CM

## 2019-09-14 DIAGNOSIS — M25552 Pain in left hip: Secondary | ICD-10-CM | POA: Diagnosis not present

## 2019-09-14 NOTE — Therapy (Signed)
[facility] [address] [place], [place], [address] Phone: [phone]   Fax:  [phone]  Physical Therapy Treatment  Patient Details  Name: Crystal Madden MRN: [id number] Date of Birth: [birth year] Referring Provider (PT): Lynne Leader   Encounter Date: 09/14/2019  PT End of Session - 09/14/19 1238    Visit Number  3    Number of Visits  12    Date for PT Re-Evaluation  10/18/19    Authorization Type  [facility]    PT Start Time  0930    PT Stop Time  1014    PT Time Calculation (min)  44 min    Activity Tolerance  Patient tolerated treatment well    Behavior During Therapy  [facility] for tasks assessed/performed       Past Medical History:  Diagnosis Date  . Hyperlipidemia   . Obesity (BMI 30-39.9)     Past Surgical History:  Procedure Laterality Date  . BLADDER SUSPENSION    . CESAREAN SECTION    . LUMBAR MICRODISCECTOMY  2018  . TONSILLECTOMY  2000    There were no vitals filed for this visit.  Subjective Assessment - 09/14/19 1236    Subjective  Pt states continued pain in hips with increased activity, and at night/sleep    Currently in Pain?  Yes    Pain Score  4     Pain Location  Back    Pain Orientation  Right;Left    Pain Descriptors / Indicators  Aching    Pain Type  Chronic pain    Pain Radiating Towards  bil hips    Pain Onset  More than a month ago    Pain Frequency  Intermittent                       [facility] Adult PT Treatment/Exercise - 09/14/19 1246      Posture/Postural Control   Posture Comments  Standing: L hip lower, mild rotation to L/post,  mild increase in lordosis in standing       Exercises   Exercises  Lumbar      Lumbar Exercises: Stretches   Single Knee to Chest Stretch  3 reps;30 seconds    Piriformis Stretch  --    Piriformis Stretch Limitations  --    Other Lumbar Stretch Exercise  Hip Flexor stretch: thomas test position(therapist assist ) x2 min bil;       Lumbar Exercises: Aerobic   Stationary Bike  L2 x 7 min;       Lumbar Exercises: Standing   Functional Squats  --    Functional Squats Limitations  --      Lumbar Exercises: Supine   Ab Set  --    Pelvic Tilt  15 reps    Bent Knee Raise  --    Bridge  --    Bridge with [employer]  20 reps    Straight Leg Raise  --      Lumbar Exercises: Prone   Other Prone Lumbar Exercises  Prone press ups 2x10;       Manual Therapy   Manual Therapy  Joint mobilization;Soft tissue mobilization;Manual Traction;Passive ROM;Muscle Energy Technique    Joint Mobilization  HIp inf glides , and ant mobs (prone) , ant pelvic mobs (s/l)     Passive ROM  --    Manual Traction  long leg distraction for bil hips x2 min each  Muscle Energy Technique  MET to improve L post rot               PT Short Term Goals - 09/06/19 1232      PT SHORT TERM GOAL #1   Title  Pt to be independent with initial HEP    Time  2    Period  Weeks    Target Date  09/20/19      PT SHORT TERM GOAL #2   Title  Pt to report decreased pain in bil hips to 0-2/10 with bending motion.    Time  3    Period  Weeks    Status  New    Target Date  09/27/19        PT Long Term Goals - 09/06/19 1233      PT LONG TERM GOAL #1   Title  Pt to demo ability for full lumbar ROM/ WNL, and pain free    Time  6    Period  Weeks    Status  New    Target Date  10/18/19      PT LONG TERM GOAL #2   Title  Pt to be independent with final HEP    Time  6    Period  Weeks    Status  New    Target Date  10/18/19      PT LONG TERM GOAL #3   Title  Pt to report decreased pain in back and bil hips, to 0-2/10 with bending, and activity, to improve ability for IADLS.    Time  6    Period  Weeks    Status  New    Target Date  10/18/19      PT LONG TERM GOAL #4   Title  Pt to demo optimal mechanics for bend, lift, squat, to improve ability and pain wiht functional activity    Time  6    Period  Weeks    Status  New     Target Date  10/18/19      PT LONG TERM GOAL #5   Title  Pt to demo improved core stabilization to be [facility] for pt age, wiht no/minimal postural changes with strengthening exercises.    Time  6    Period  Weeks    Status  New    Target Date  10/18/19            Plan - 09/14/19 1240    Clinical Impression Statement  Focus on manual for hip mobility. Added prone press ups for HEP , will assess effects next visit, pt ed on d/c exercise if increases pain. Will continue to focus on hip flexor and hip mobility for pain relief, as well as low back. Will progress strength as able/tolerated.    Personal Factors and Comorbidities  Fitness    Examination-Activity Limitations  Locomotion Level;Transfers;Bend;Squat;Lift;Stand    Examination-Participation Restrictions  Meal Prep;Cleaning;Community Activity;Driving;Valla Leaver [facility]    Stability/Clinical Decision Making  Stable/Uncomplicated    Rehab Potential  Good    PT Frequency  2x / week    PT Duration  6 weeks    PT Treatment/Interventions  ADLs/Self Care Home Management;Cryotherapy;Electrical Stimulation;DME Instruction;Ultrasound;Traction;Moist Heat;Iontophoresis 64m/ml Dexamethasone;Gait training;Stair training;Functional mobility training;Therapeutic activities;Therapeutic exercise;Balance training;Neuromuscular re-education;Manual techniques;Patient/family education;Passive range of motion;Dry needling;Joint Manipulations;Spinal Manipulations;Taping    Consulted and Agree with Plan of Care  Patient       Patient will benefit from skilled therapeutic intervention in order to improve the  following deficits and impairments:  Decreased range of motion, Increased muscle spasms, Decreased activity tolerance, Pain, Improper body mechanics, Impaired flexibility, Decreased mobility, Decreased strength  Visit Diagnosis: Chronic bilateral low back pain, unspecified whether sciatica present  Pain in left hip  Pain in right hip     Problem  List Patient Active Problem List   Diagnosis Date Noted  . Hypertension 06/14/2019  . Reactive airway disease 08/28/2018  . Vitamin D deficiency 08/28/2018  . Class 1 obesity due to excess calories without serious comorbidity with body mass index (BMI) of 34.0 to 34.9 in adult 08/27/2018  . Attention deficit hyperactivity disorder (ADHD), combined type 08/27/2018  . Diverticulitis of sigmoid colon 07/22/2016    Lyndee Hensen, PT, DPT 3:14 PM  09/14/19    [facility] [place], [place], [address] Phone: [phone]   Fax:  [phone]  Name: LUCETTA BAEHR MRN: [id number] Date of Birth: [birth year]

## 2019-09-15 ENCOUNTER — Other Ambulatory Visit: Payer: Self-pay | Admitting: Physician Assistant

## 2019-09-15 DIAGNOSIS — E669 Obesity, unspecified: Secondary | ICD-10-CM

## 2019-09-20 ENCOUNTER — Other Ambulatory Visit: Payer: Self-pay

## 2019-09-21 ENCOUNTER — Ambulatory Visit: Payer: No Typology Code available for payment source | Admitting: Physical Therapy

## 2019-09-21 ENCOUNTER — Encounter: Payer: Self-pay | Admitting: Physical Therapy

## 2019-09-21 DIAGNOSIS — G8929 Other chronic pain: Secondary | ICD-10-CM | POA: Diagnosis not present

## 2019-09-21 DIAGNOSIS — M545 Low back pain: Secondary | ICD-10-CM | POA: Diagnosis not present

## 2019-09-21 DIAGNOSIS — M25552 Pain in left hip: Secondary | ICD-10-CM | POA: Diagnosis not present

## 2019-09-21 DIAGNOSIS — M25551 Pain in right hip: Secondary | ICD-10-CM

## 2019-09-21 NOTE — Therapy (Signed)
[facility] [address] [place], [place], [address] Phone: [phone]   Fax:  [phone]  Physical Therapy Treatment  Patient Details  Name: Crystal Madden MRN: [id number] Date of Birth: August 09, 1976 Referring Provider (PT): Lynne Leader   Encounter Date: 09/21/2019  PT End of Session - 09/21/19 1514    Visit Number  4    Number of Visits  12    Date for PT Re-Evaluation  10/18/19    Authorization Type  [facility]    PT Start Time  0935    PT Stop Time  1015    PT Time Calculation (min)  40 min    Activity Tolerance  Patient tolerated treatment well    Behavior During Therapy  [facility] for tasks assessed/performed       Past Medical History:  Diagnosis Date  . Hyperlipidemia   . Obesity (BMI 30-39.9)     Past Surgical History:  Procedure Laterality Date  . BLADDER SUSPENSION    . CESAREAN SECTION    . LUMBAR MICRODISCECTOMY  2018  . TONSILLECTOMY  2000    There were no vitals filed for this visit.  Subjective Assessment - 09/21/19 1513    Subjective  Pt states improved pain in hips. Still has pain in back, sharper pains in bil low back region/ SI    Currently in Pain?  Yes    Pain Score  4     Pain Location  Back    Pain Orientation  Right;Left    Pain Descriptors / Indicators  Aching;Sharp    Pain Type  Chronic pain    Pain Onset  More than a month ago    Pain Frequency  Intermittent                       [facility] Adult PT Treatment/Exercise - 09/21/19 0001      Lumbar Exercises: Stretches   Other Lumbar Stretch Exercise  Hip Flexor stretch: off side of table. (therapist assist ) x2 min bil;       Lumbar Exercises: Aerobic   Stationary Bike  L2 x 6 min;       Lumbar Exercises: Prone   Other Prone Lumbar Exercises  Prone press ups 2x10;       Manual Therapy   Manual therapy comments  skilled palpation and monitoring of soft tissue with dry needling     Joint Mobilization  HIp inf glides bil      Manual Traction  long leg distraction for bil hips x2 min each     Muscle Energy Technique  MET to improve L post rot       Trigger Point Dry Needling - 09/21/19 0001    Consent Given?  Yes    Education Handout Provided  Yes    Muscles Treated Back/Hip  Gluteus minimus;Piriformis;Gluteus maximus    Gluteus Minimus Response  Twitch response elicited;Palpable increased muscle length   L   Gluteus Maximus Response  Palpable increased muscle length   R   Piriformis Response  Palpable increased muscle length   R            PT Short Term Goals - 09/06/19 1232      PT SHORT TERM GOAL #1   Title  Pt to be independent with initial HEP    Time  2    Period  Weeks    Target Date  09/20/19      PT  SHORT TERM GOAL #2   Title  Pt to report decreased pain in bil hips to 0-2/10 with bending motion.    Time  3    Period  Weeks    Status  New    Target Date  09/27/19        PT Long Term Goals - 09/06/19 1233      PT LONG TERM GOAL #1   Title  Pt to demo ability for full lumbar ROM/ WNL, and pain free    Time  6    Period  Weeks    Status  New    Target Date  10/18/19      PT LONG TERM GOAL #2   Title  Pt to be independent with final HEP    Time  6    Period  Weeks    Status  New    Target Date  10/18/19      PT LONG TERM GOAL #3   Title  Pt to report decreased pain in back and bil hips, to 0-2/10 with bending, and activity, to improve ability for IADLS.    Time  6    Period  Weeks    Status  New    Target Date  10/18/19      PT LONG TERM GOAL #4   Title  Pt to demo optimal mechanics for bend, lift, squat, to improve ability and pain wiht functional activity    Time  6    Period  Weeks    Status  New    Target Date  10/18/19      PT LONG TERM GOAL #5   Title  Pt to demo improved core stabilization to be [facility] for pt age, wiht no/minimal postural changes with strengthening exercises.    Time  6    Period  Weeks    Status  New    Target Date  10/18/19             Plan - 09/21/19 1516    Clinical Impression Statement  Pt with improving pain in hips, still has soreness in low back. Improving ROM for extension today. Pt with soreness and trigger points in bil glutes, addressed with DN today. Plan to progress as tolerated.    Personal Factors and Comorbidities  Fitness    Examination-Activity Limitations  Locomotion Level;Transfers;Bend;Squat;Lift;Stand    Examination-Participation Restrictions  Meal Prep;Cleaning;Community Activity;Driving;Valla Leaver [facility]    Stability/Clinical Decision Making  Stable/Uncomplicated    Rehab Potential  Good    PT Frequency  2x / week    PT Duration  6 weeks    PT Treatment/Interventions  ADLs/Self Care Home Management;Cryotherapy;Electrical Stimulation;DME Instruction;Ultrasound;Traction;Moist Heat;Iontophoresis 67m/ml Dexamethasone;Gait training;Stair training;Functional mobility training;Therapeutic activities;Therapeutic exercise;Balance training;Neuromuscular re-education;Manual techniques;Patient/family education;Passive range of motion;Dry needling;Joint Manipulations;Spinal Manipulations;Taping    Consulted and Agree with Plan of Care  Patient       Patient will benefit from skilled therapeutic intervention in order to improve the following deficits and impairments:  Decreased range of motion, Increased muscle spasms, Decreased activity tolerance, Pain, Improper body mechanics, Impaired flexibility, Decreased mobility, Decreased strength  Visit Diagnosis: Chronic bilateral low back pain, unspecified whether sciatica present  Pain in left hip  Pain in right hip     Problem List Patient Active Problem List   Diagnosis Date Noted  . Hypertension 06/14/2019  . Reactive airway disease 08/28/2018  . Vitamin D deficiency 08/28/2018  . Class 1 obesity due to excess calories without serious comorbidity with  body mass index (BMI) of 34.0 to 34.9 in adult 08/27/2018  . Attention deficit hyperactivity  disorder (ADHD), combined type 08/27/2018  . Diverticulitis of sigmoid colon 07/22/2016    Lyndee Hensen, PT, DPT 3:17 PM  09/21/19    [facility] [place], [place], [address] Phone: [phone]   Fax:  [phone]  Name: BERNADETT MILIAN MRN: [id number] Date of Birth: May 21, 1976

## 2019-09-22 ENCOUNTER — Other Ambulatory Visit: Payer: Self-pay

## 2019-09-22 ENCOUNTER — Ambulatory Visit: Payer: No Typology Code available for payment source | Admitting: Physical Therapy

## 2019-09-22 ENCOUNTER — Encounter: Payer: Self-pay | Admitting: Physical Therapy

## 2019-09-22 DIAGNOSIS — M25552 Pain in left hip: Secondary | ICD-10-CM | POA: Diagnosis not present

## 2019-09-22 DIAGNOSIS — M25551 Pain in right hip: Secondary | ICD-10-CM

## 2019-09-22 DIAGNOSIS — G8929 Other chronic pain: Secondary | ICD-10-CM

## 2019-09-22 DIAGNOSIS — M545 Low back pain: Secondary | ICD-10-CM

## 2019-09-22 NOTE — Therapy (Signed)
[facility] [address] [place], [place], [address] Phone: [phone]   Fax:  [phone]  Physical Therapy Treatment  Patient Details  Name: Crystal Madden MRN: [id number] Date of Birth: 1975/11/23 Referring Provider (PT): Lynne Leader   Encounter Date: 09/22/2019  PT End of Session - 09/22/19 0941    Visit Number  5    Number of Visits  12    Date for PT Re-Evaluation  10/18/19    Authorization Type  [facility]    PT Start Time  0932    PT Stop Time  1010    PT Time Calculation (min)  38 min    Activity Tolerance  Patient tolerated treatment well    Behavior During Therapy  [facility] for tasks assessed/performed       Past Medical History:  Diagnosis Date  . Hyperlipidemia   . Obesity (BMI 30-39.9)     Past Surgical History:  Procedure Laterality Date  . BLADDER SUSPENSION    . CESAREAN SECTION    . LUMBAR MICRODISCECTOMY  2018  . TONSILLECTOMY  2000    There were no vitals filed for this visit.  Subjective Assessment - 09/22/19 0940    Subjective  Pt states less pain in hips and back today.    Currently in Pain?  Yes    Pain Score  3     Pain Location  Back    Pain Orientation  Right;Left    Pain Descriptors / Indicators  Aching    Pain Type  Chronic pain    Pain Onset  More than a month ago    Pain Frequency  Intermittent                       [facility] Adult PT Treatment/Exercise - 09/22/19 0942      Lumbar Exercises: Stretches   Active Hamstring Stretch  2 reps;30 seconds    Piriformis Stretch  2 reps;30 seconds    Piriformis Stretch Limitations  supine fig 4 ;     Other Lumbar Stretch Exercise  --    Other Lumbar Stretch Exercise  childs pose 30 sec L/R/Center      Lumbar Exercises: Aerobic   Stationary Bike  L2 x 7 min;       Lumbar Exercises: Supine   Ab Set  5 reps    Bent Knee Raise  20 reps    Bent Knee Raise Limitations  with education on TA    Bridge with [employer]  20  reps    Straight Leg Raise  15 reps    Other Supine Lumbar Exercises  Modified crunch x20;      Lumbar Exercises: Sidelying   Clam  15 reps;Both      Lumbar Exercises: Prone   Other Prone Lumbar Exercises  --      Manual Therapy   Manual Therapy  Soft tissue mobilization    Manual therapy comments  --    Joint Mobilization  --    Soft tissue mobilization  DTm and IASM to bil lumbar paraspinals     Manual Traction  --    Muscle Energy Technique  --               PT Short Term Goals - 09/06/19 1232      PT SHORT TERM GOAL #1   Title  Pt to be independent with initial HEP    Time  2  Period  Weeks    Target Date  09/20/19      PT SHORT TERM GOAL #2   Title  Pt to report decreased pain in bil hips to 0-2/10 with bending motion.    Time  3    Period  Weeks    Status  New    Target Date  09/27/19        PT Long Term Goals - 09/06/19 1233      PT LONG TERM GOAL #1   Title  Pt to demo ability for full lumbar ROM/ WNL, and pain free    Time  6    Period  Weeks    Status  New    Target Date  10/18/19      PT LONG TERM GOAL #2   Title  Pt to be independent with final HEP    Time  6    Period  Weeks    Status  New    Target Date  10/18/19      PT LONG TERM GOAL #3   Title  Pt to report decreased pain in back and bil hips, to 0-2/10 with bending, and activity, to improve ability for IADLS.    Time  6    Period  Weeks    Status  New    Target Date  10/18/19      PT LONG TERM GOAL #4   Title  Pt to demo optimal mechanics for bend, lift, squat, to improve ability and pain wiht functional activity    Time  6    Period  Weeks    Status  New    Target Date  10/18/19      PT LONG TERM GOAL #5   Title  Pt to demo improved core stabilization to be [facility] for pt age, wiht no/minimal postural changes with strengthening exercises.    Time  6    Period  Weeks    Status  New    Target Date  10/18/19            Plan - 09/22/19 1015    Clinical Impression  Statement  Pt with improved pain in hips and back today. Tightness in bil lumbar region addressed with DTM/IASTM. THer ex progressed for stretching and stabiliztion. Pt req cuing for sustained TA contraction. Plan to progress as tolerated.    Personal Factors and Comorbidities  Fitness    Examination-Activity Limitations  Locomotion Level;Transfers;Bend;Squat;Lift;Stand    Examination-Participation Restrictions  Meal Prep;Cleaning;Community Activity;Driving;Valla Leaver [facility]    Stability/Clinical Decision Making  Stable/Uncomplicated    Rehab Potential  Good    PT Frequency  2x / week    PT Duration  6 weeks    PT Treatment/Interventions  ADLs/Self Care Home Management;Cryotherapy;Electrical Stimulation;DME Instruction;Ultrasound;Traction;Moist Heat;Iontophoresis 4mg /ml Dexamethasone;Gait training;Stair training;Functional mobility training;Therapeutic activities;Therapeutic exercise;Balance training;Neuromuscular re-education;Manual techniques;Patient/family education;Passive range of motion;Dry needling;Joint Manipulations;Spinal Manipulations;Taping    Consulted and Agree with Plan of Care  Patient       Patient will benefit from skilled therapeutic intervention in order to improve the following deficits and impairments:  Decreased range of motion, Increased muscle spasms, Decreased activity tolerance, Pain, Improper body mechanics, Impaired flexibility, Decreased mobility, Decreased strength  Visit Diagnosis: Chronic bilateral low back pain, unspecified whether sciatica present  Pain in left hip  Pain in right hip     Problem List Patient Active Problem List   Diagnosis Date Noted  . Hypertension 06/14/2019  . Reactive airway disease 08/28/2018  .  Vitamin D deficiency 08/28/2018  . Class 1 obesity due to excess calories without serious comorbidity with body mass index (BMI) of 34.0 to 34.9 in adult 08/27/2018  . Attention deficit hyperactivity disorder (ADHD), combined type  08/27/2018  . Diverticulitis of sigmoid colon 07/22/2016    Lyndee Hensen, PT, DPT 10:16 AM  09/22/19    [facility] [place], [place], [address] Phone: [phone]   Fax:  [phone]  Name: Crystal Madden MRN: [id number] Date of Birth: Jul 07, 1976

## 2019-09-27 ENCOUNTER — Encounter: Payer: No Typology Code available for payment source | Admitting: Physical Therapy

## 2019-09-28 ENCOUNTER — Encounter: Payer: Self-pay | Admitting: Family Medicine

## 2019-09-28 ENCOUNTER — Ambulatory Visit (INDEPENDENT_AMBULATORY_CARE_PROVIDER_SITE_OTHER): Payer: No Typology Code available for payment source | Admitting: Family Medicine

## 2019-09-28 ENCOUNTER — Other Ambulatory Visit: Payer: Self-pay

## 2019-09-28 VITALS — BP 130/92 | HR 97 | Ht 66.0 in | Wt 239.0 lb

## 2019-09-28 DIAGNOSIS — M545 Low back pain, unspecified: Secondary | ICD-10-CM

## 2019-09-28 NOTE — Patient Instructions (Signed)
Thank you for coming in today. Plan for MRI and likely Facet Injection.  Phone number to [facility] is [phone] Let me know if you do not hear about scheduling in 1 week.  Come back or go to the emergency room if you notice new weakness new numbness problems walking or bowel or bladder problems.   Facet Joint Block The facet joints connect the bones of the spine (vertebrae). They make it possible for you to bend, twist, and make other movements with your spine. They also keep you from bending too far, twisting too far, and making other extreme movements. A facet joint block is a procedure in which a numbing medicine (anesthetic) is injected into a facet joint. In many cases, an anti-inflammatory medicine (steroid) is also injected. A facet joint block may be done:  To diagnose neck or back pain. If the pain gets better after a facet joint block, it means the pain is probably coming from the facet joint. If the pain does not get better, it means the pain is probably not coming from the facet joint.  To relieve neck or back pain that is caused by an inflamed facet joint. A facet joint block is only done to relieve pain if the pain does not improve with other methods, such as medicine, exercise programs, and physical therapy. Tell a health care provider about:  Any allergies you have.  All medicines you are taking, including vitamins, herbs, eye drops, creams, and over-the-counter medicines.  Any problems you or family members have had with anesthetic medicines.  Any blood disorders you have.  Any surgeries you have had.  Any medical conditions you have or have had.  Whether you are pregnant or may be pregnant. What are the risks? Generally, this is a safe procedure. However, problems may occur, including:  Bleeding.  Injury to a nerve near the injection site.  Pain at the injection site.  Weakness or numbness in areas controlled by nerves near the injection  site.  Infection.  Temporary fluid retention.  Allergic reactions to medicines or dyes.  Injury to other structures or organs near the injection site. What happens before the procedure? Medicines Ask your health care provider about:  Changing or stopping your regular medicines. This is especially important if you are taking diabetes medicines or blood thinners.  Taking medicines such as aspirin and ibuprofen. These medicines can thin your blood. Do not take these medicines unless your health care provider tells you to take them.  Taking over-the-counter medicines, vitamins, herbs, and supplements. Eating and drinking Follow instructions from your health care provider about eating and drinking, which may include:  8 hours before the procedure - stop eating heavy meals or foods, such as meat, fried foods, or fatty foods.  6 hours before the procedure - stop eating light meals or foods, such as toast or cereal.  6 hours before the procedure - stop drinking milk or drinks that contain milk.  2 hours before the procedure - stop drinking clear liquids. Staying hydrated Follow instructions from your health care provider about hydration, which may include:  Up to 2 hours before the procedure - you may continue to drink clear liquids, such as water, clear fruit juice, black coffee, and plain tea. General instructions  Do not use any products that contain nicotine or tobacco for at least 4-6 weeks before the procedure. These products include cigarettes, e-cigarettes, and chewing tobacco. If you need help quitting, ask your health care provider.  Plan to have someone take you home from the hospital or clinic.  Ask your health care provider: ? How your surgery site will be marked. ? What steps will be taken to help prevent infection. These may include:  Removing hair at the surgery site.  Washing skin with a germ-killing soap.  Receiving antibiotic medicine. What happens during the  procedure?   You will put on a hospital gown.  You will lie on your stomach on an X-ray table. You may be asked to lie in a different position if an injection will be made in your neck.  Machines will be used to monitor your oxygen levels, heart rate, and blood pressure.  Your skin will be cleaned.  If an injection will be made in your neck, an IV will be inserted into one of your veins. Fluids and medicine will flow directly into your body through the IV.  A numbing medicine (local anesthetic) will be applied to your skin. Your skin may sting or burn for a moment.  A video X-ray machine (fluoroscopy) will be used to find the joint. In some cases, a CT scan may be used.  A contrast dye may be injected into the facet joint area to help find the joint.  When the joint is located, an anesthetic will be injected into the joint through the needle.  Your health care provider will ask you whether you feel pain relief. ? If you feel relief, a steroid may be injected to provide pain relief for a longer period of time. ? If you do not feel relief or feel only partial relief, additional injections of an anesthetic may be made in other facet joints.  The needle will be removed.  Your skin will be cleaned.  A bandage (dressing) will be applied over each injection site. The procedure may vary among health care providers and hospitals. What happens after the procedure?  Your blood pressure, heart rate, breathing rate, and blood oxygen level will be monitored until you leave the hospital or clinic.  You will lie down and rest for a period of time. Summary  A facet joint block is a procedure in which a numbing medicine (anesthetic) is injected into a facet joint. An anti-inflammatory medicine (stereoid) may also be injected.  Follow instructions from your health care provider about medicines and eating and drinking before the procedure.  Do not use any products that contain nicotine or  tobacco for at least 4-6 weeks before the procedure.  You will lie on your stomach for the procedure, but you may be asked to lie in a different position if an injection will be made in your neck.  When the joint is located, an anesthetic will be injected into the joint through the needle. This information is not intended to replace advice given to you by your health care provider. Make sure you discuss any questions you have with your health care provider. Document Revised: 11/18/2018 Document Reviewed: 07/02/2018 Elsevier Patient Education  [place].

## 2019-09-28 NOTE — Progress Notes (Signed)
I, Wendy Poet, LAT, ATC, am serving as scribe for Dr. Lynne Leader.  Crystal Madden is a 44 y.o. female who presents to [place] at [facility] today for f/u of low back pain and L anterior and lateral hip pain.  She has a hx of a lumbar discectomy 2-3 years ago.  She was last seen by Dr. Georgina Snell on 08/25/19 and was having increased low back pain after doing a lot of packing for a trip.  She noted "electrical" type pain in her L lateral thigh into her lower leg.  She was also having increase L hip pain w/ active L hip flexion and was having issues w/ jaw pain.  She was referred to outpatient PT and has completed 5 visits.  She has been taking gabapentin and baclofen.  Since her last visit, pt reports that she is feeling better overall but notes inconsistency in her symptoms.  She states that her L anterior hip pain is much better but notes that she con't to have "a lot of pain" along her lower back and proximal buttocks that will also radiate into her lateral hips.    Diagnostic testing: L-spine XR, L hip XR - 08/25/19  Pertinent review of systems: No fevers or chills  Relevant historical information: Prior surgery L5-S1.  No retained surgical hardware   Exam:  BP (!) 130/92 (BP Location: Left Arm, Patient Position: Sitting, Cuff Size: Large)   Pulse 97   Ht 5\' 6"  (1.676 m)   Wt 239 lb (108.4 kg)   SpO2 97%   BMI 38.58 kg/m  General: Well Developed, well nourished, and in no acute distress.   MSK:  L-spine: Nontender to spinal midline.   Mild tender palpation right lumbar paraspinal musculature and right SI joint. Normal lumbar motion. Lower extremity strength is intact. No pain with SI joint compression test. Negative straight leg raise test.     Lab and Radiology Results DG Lumbar Spine Complete  Result Date: 08/25/2019 CLINICAL DATA:  Pain with concern for radicular symptoms on the left EXAM: LUMBAR SPINE - COMPLETE 4+ VIEW COMPARISON:  Lumbar MRI  November 21, 2016 FINDINGS: Frontal, lateral, spot lumbosacral lateral, and bilateral oblique views were obtained. There are 5 non-rib-bearing lumbar type vertebral bodies. Note that there is a partial assimilation joint on the left at L5. There is no acute fracture or spondylolisthesis. Cortical irregularity along the anterior aspect of the L2 vertebral body appears chronic and likely indicative of old trauma in this area. There is moderately severe disc space narrowing at L1-2 and L2-3 with moderate disc space narrowing at L3-4. There is mild facet osteoarthritic change at L2-3 and L3-4 bilaterally. No erosive change. IMPRESSION: Question old trauma involving the anterior aspect of the L2 vertebral body. No acute fracture. No spondylolisthesis. Osteoarthritic change at several levels noted. Electronically Signed   By: Lowella Grip III M.D.   On: 08/25/2019 11:58   DG Hip Unilat W OR W/O Pelvis 2-3 Views Left  Result Date: 08/25/2019 CLINICAL DATA:  Pain EXAM: DG HIP  2-3V LEFT COMPARISON:  None. FINDINGS: Frontal and lateral views were obtained. No fracture or dislocation. There is slight disc space narrowing along the superior aspect of the joint, particularly noted on the lateral view. The joint spaces appear normal. No appreciable acetabular bony overgrowth. No intra-articular calcification or erosion. IMPRESSION: Slight narrowing along the superior aspect of the hip joint, best appreciated on the lateral view. No associated bony overgrowth in this  area. There may be a degree of impingement in part due to the narrowing of the joint in this area. No erosive change. No fracture or dislocation. Electronically Signed   By: Lowella Grip III M.D.   On: 08/25/2019 11:55   I, Lynne Leader, personally (independently) visualized and performed the interpretation of the images attached in this note.     Assessment and Plan: 44 y.o. female with  Low back pain.  Symptoms ongoing longer than 6 weeks failing  typical trials of conservative management for longer than 6 weeks. Fortunately patient has had some benefit with PT including significant reduction of hip pain and radicular symptoms. However this point she continues to have a fair amount of low back pain that is interfering with her ability to exercise normally.  Pain is located predominantly the right low back.  Plan for MRI for facet injection planning likely right L5-S1 L4-L5 however will adjust plan based on MRI results.  Reviewed plan for facet injection with patient expresses understanding agreement.    Orders Placed This Encounter  Procedures  . MR Lumbar Spine Wo Contrast    Standing Status:   Future    Standing Expiration Date:   11/25/2020    Order Specific Question:   What is the patient's sedation requirement?    Answer:   No Sedation    Order Specific Question:   Does the patient have a pacemaker or implanted devices?    Answer:   No    Order Specific Question:   Preferred imaging location?    Answer:   Product/process development scientist (table limit-350lbs)    Order Specific Question:   Radiology Contrast Protocol - do NOT remove file path    Answer:   [email or web address]   No orders of the defined types were placed in this encounter.    Discussed warning signs or symptoms. Please see discharge instructions. Patient expresses understanding.   The above documentation has been reviewed and is accurate and complete Lynne Leader   Total encounter time 20 minutes including charting time date of service.;

## 2019-09-29 ENCOUNTER — Encounter: Payer: No Typology Code available for payment source | Admitting: Physical Therapy

## 2019-09-29 ENCOUNTER — Ambulatory Visit: Payer: No Typology Code available for payment source | Admitting: Family Medicine

## 2019-09-30 ENCOUNTER — Other Ambulatory Visit: Payer: Self-pay | Admitting: Family Medicine

## 2019-10-03 ENCOUNTER — Other Ambulatory Visit: Payer: No Typology Code available for payment source

## 2019-10-03 MED ORDER — HYDROCODONE-ACETAMINOPHEN 5-325 MG PO TABS: 1.0000 | ORAL_TABLET | Freq: Four times a day (QID) | ORAL | 0 refills | Status: DC | PRN

## 2019-10-03 MED FILL — HYDROCODON-APAP 5-325: 5-325 | 4 days supply | Qty: 15 | Fill #0

## 2019-10-03 NOTE — Telephone Encounter (Signed)
Please see refill request and advise.

## 2019-10-04 ENCOUNTER — Encounter (INDEPENDENT_AMBULATORY_CARE_PROVIDER_SITE_OTHER): Payer: Self-pay | Admitting: Family Medicine

## 2019-10-04 ENCOUNTER — Ambulatory Visit (INDEPENDENT_AMBULATORY_CARE_PROVIDER_SITE_OTHER): Payer: No Typology Code available for payment source | Admitting: Family Medicine

## 2019-10-04 ENCOUNTER — Other Ambulatory Visit: Payer: Self-pay

## 2019-10-04 VITALS — BP 133/81 | HR 81 | Temp 97.6°F | Ht 66.0 in | Wt 230.0 lb

## 2019-10-04 DIAGNOSIS — R0602 Shortness of breath: Secondary | ICD-10-CM

## 2019-10-04 DIAGNOSIS — I10 Essential (primary) hypertension: Secondary | ICD-10-CM | POA: Diagnosis not present

## 2019-10-04 DIAGNOSIS — E7849 Other hyperlipidemia: Secondary | ICD-10-CM

## 2019-10-04 DIAGNOSIS — Z0289 Encounter for other administrative examinations: Secondary | ICD-10-CM

## 2019-10-04 DIAGNOSIS — Z9189 Other specified personal risk factors, not elsewhere classified: Secondary | ICD-10-CM | POA: Diagnosis not present

## 2019-10-04 DIAGNOSIS — F418 Other specified anxiety disorders: Secondary | ICD-10-CM

## 2019-10-04 DIAGNOSIS — R5383 Other fatigue: Secondary | ICD-10-CM

## 2019-10-04 DIAGNOSIS — R739 Hyperglycemia, unspecified: Secondary | ICD-10-CM

## 2019-10-04 DIAGNOSIS — Z6837 Body mass index (BMI) 37.0-37.9, adult: Secondary | ICD-10-CM

## 2019-10-04 NOTE — Progress Notes (Signed)
Office: [phone]  /  Fax: [phone]    Date: October 05, 2019   Appointment Start Time: 3:06pm Duration: 34 minutes Provider: Gaytri Barker, Psy.D. Type of Session: Intake for Individual Therapy  Location of Patient: Home Location of Provider: Provider's Home Type of Contact: Telepsychological Visit via [employer] WebEx  Informed Consent: This provider called Crystal Madden at 3:04pm as she did not present for the WebEx appointment. She reported she was trying to join. As such, today's appointment was initiated 6 minutes late. Prior to proceeding with today's appointment, two pieces of identifying information were obtained. In addition, Amyria's physical location at the time of this appointment was obtained as well a phone number she could be reached at in the event of technical difficulties. Crystal Madden and this provider participated in today's telepsychological service.   The provider's role was explained to Crystal Madden. The provider reviewed and discussed issues of confidentiality, privacy, and limits therein (e.g., reporting obligations). In addition to verbal informed consent, written informed consent for psychological services was obtained prior to the initial appointment. Since the clinic is not a 24/7 crisis center, mental health emergency resources were shared and this  provider explained MyChart, e-mail, voicemail, and/or other messaging systems should be utilized only for non-emergency reasons. This provider also explained that information obtained during appointments will be placed in Crystal Madden's medical record and relevant information will be shared with other providers at [facility] for coordination of care. Moreover, Crystal Madden agreed information may be shared with other [facility] providers as needed for coordination of care. By signing the service agreement document, Crystal Madden provided written consent for coordination of care. Prior to initiating  telepsychological services, Crystal Madden completed an informed consent document, which included the development of a safety plan (i.e., an emergency contact, nearest emergency room, and emergency resources) in the event of an emergency/crisis. Destynie expressed understanding of the rationale of the safety plan. Crystal Madden verbally acknowledged understanding she is ultimately responsible for understanding her insurance benefits for telepsychological and in-person services. This provider also reviewed confidentiality, as it relates to telepsychological services, as well as the rationale for telepsychological services (i.e., to reduce exposure risk to COVID-19). Crystal Madden  acknowledged understanding that appointments cannot be recorded without both party consent and she is aware she is responsible for securing confidentiality on her end of the session. Crystal Madden verbally consented to proceed.  Chief Complaint/HPI: Crystal Madden was referred by Dr. Caren Beasley due to depression with anxiety. Per the note for the initial visit with Dr. Caren Beasley on October 04, 2019, "Crystal Madden is not on medications, and she notes her emotional eating has worsened this last month." The note for the initial appointment with Dr. Caren Beasley  indicated the following: "Her family eats meals together, she thinks her family will eat healthier with her, her desired weight loss is 70 lbs, she has been heavy most of her life, she started gaining weight after her babies, her heaviest weight ever was 230 pounds, she has significant food cravings issues, she is frequently drinking liquids with calories, she frequently makes poor food choices, she frequently eats larger portions than normal and she struggles with emotional eating." Sahmya's Food and Mood (modified PHQ-9) score on October 04, 2019 was 17.  During today's appointment, Crystal Madden was verbally administered a questionnaire assessing various behaviors related to emotional eating. Crystal Madden  endorsed the following: overeat when you are celebrating, experience food cravings on a regular basis, eat certain foods when you are anxious, stressed, depressed, or   your feelings are hurt, use food to help you cope with emotional situations, find food is comforting to you, overeat when you are worried about something, overeat frequently when you are bored or lonely, overeat when you are alone, but eat much less when you are with other people, eat to help you stay awake and eat as a reward. She shared she craves salty foods. Crystal Madden believes the onset of emotional eating was likely om childhood and described the current frequency of emotional eating as "daily." In addition, Crystal Madden denied a history of binge eating.  However, she described eating additional snacks when she does not feel satisfied. Crystal Madden denied a history of restricting food intake, purging and engagement in other compensatory strategies, and has never been diagnosed with an eating disorder. She also denied a history of treatment for emotional eating. Moreover, Crystal Madden indicated boredom and procrastination triggers emotional eating, whereas being out or spending time with family makes emotional eating better. Furthermore, Crystal Madden denied other problems of concern.    Mental Status Examination:  Appearance: well groomed and appropriate hygiene  Behavior: appropriate to circumstances Mood: euthymic Affect: mood congruent Speech: normal in rate, volume, and tone Eye Contact: appropriate Psychomotor Activity: appropriate Gait: unable to assess Thought Process: linear, logical, and goal directed  Thought Content/Perception: denies suicidal and homicidal ideation, plan, and intent and no hallucinations, delusions, bizarre thinking or behavior reported or observed Orientation: time, person, place and purpose of appointment Memory/Concentration: memory, attention, language, and fund of knowledge intact  Insight/Judgment: good  Family &  Psychosocial History: Crystal Madden reported she is married and she has three children (ages 5, 10, and 14). 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 Additionally, Crystal Madden shared her highest level of education obtained is a master's degree. Currently, Crystal Madden's social support system consists of her husband, family, neighbors, and co-workers. Moreover, Crystal Madden stated she resides with her husband and children.   Medical History:  Past Medical History:  Diagnosis Date  . Anxiety   . Depression   . High blood pressure   . Hyperlipidemia   . Lower back pain   . Obesity (BMI 30-39.9)    Past Surgical History:  Procedure Laterality Date  . BLADDER SUSPENSION    . CESAREAN SECTION    . LUMBAR MICRODISCECTOMY  2018  . TONSILLECTOMY  2000   Current Outpatient Medications on File Prior to Visit  Medication Sig Dispense Refill  . ALPRAZolam (XANAX) 0.5 MG tablet TAKE 1 TABLET BY MOUTH IN THE AM AND 1/2 TABLET IN THE AFTERNOON (Patient taking differently: as needed. ) 30 tablet 2  . baclofen (LIORESAL) 10 MG tablet Take 1-2 tablets (10-20 mg total) by mouth 3 (three) times daily as needed for muscle spasms. 90 each 1  . bimatoprost (LATISSE) 0.03 % ophthalmic solution PUT 1 DROP ON APPLICATOR &APPLY EVENLY ALONG SKIN OF UPPER LID AT BASE OF LASHES TO BOTH EYES AT BED 3 mL 12  . gabapentin (NEURONTIN) 300 MG capsule Take 1 capsule (300 mg total) by mouth 3 (three) times daily as needed (nerve pain). 90 capsule 1  . HYDROcodone-acetaminophen (NORCO/VICODIN) 5-325 MG tablet Take 1 tablet by mouth every 6 (six) hours as needed. 15 tablet 0  . naproxen (NAPROSYN) 500 MG tablet Take 1 tablet (500 mg total) by mouth 2 (two) times daily with a meal. (Patient taking differently: Take 500 mg by mouth 2 (two) times daily as needed. ) 30 tablet 0  . norethindrone (NORLYDA) 0.35   MG tablet Take 1 tablet by mouth daily.    . valsartan-hydrochlorothiazide  (DIOVAN-HCT) 80-12.5 MG tablet Take 1 tablet by mouth daily. 90 tablet 1   No current facility-administered medications on file prior to visit.  Ashaki denied a history of head injuries and loss of consciousness.    Mental Health History: Crystal Madden reported she first attended therapy around middle school as her brother passed away. She shared she re-initiated therapeutic services in college due to depression. She noted she also met with a psychiatrist at the time, adding she was prescribed Wellbutrin. Currently, her PCP prescribes Xanax PRN. Crystal Madden reported there is no history of hospitalizations for psychiatric concerns. Crystal Madden denied a family history of mental health related concerns. Shauntay reported there is no history of trauma including psychological, physical  and sexual abuse, as well as neglect. However, she described losing her brother as traumatic.   Charday described her typical mood lately as "blah." Aside from concerns noted above and endorsed on the PHQ-9, Starlena reported experiencing panic symptoms (i.e., over thinking and heavy feeling in her chest) when thinking about financial concerns as well as decreased motivation. Monick endorsed current alcohol use. More specifically, she stated she and her husband started drinking a "couple glasses of wine every night" at the onset of the pandemic, noting she they are working toward only drinking on the weekends. She denied tobacco use. She denied illicit/recreational substance use. Regarding caffeine intake, Annalisia reported consuming one cup of coffee daily and "an occasional diet Coke." Furthermore, Elinda indicated she is not experiencing the following: hallucinations and delusions, paranoia, symptoms of mania (e.g., expansive mood, flighty ideas, decreased need for sleep, engagement in risky behaviors), social withdrawal and crying spells. She also denied history of and current suicidal ideation, plan, and intent; history of and  current homicidal ideation, plan, and intent; and history of and current engagement in self-harm.  The following strengths were reported by Faelynn: managing changes and schedules and good mom. The following strengths were observed by this provider: ability to express thoughts and feelings during the therapeutic session, ability to establish and benefit from a therapeutic relationship, willingness to work toward established goal(s) with the clinic and ability to engage in reciprocal conversation.  Legal History: Amani reported there is no history of legal involvement.   Structured Assessments Results: The Patient Health Questionnaire-9 (PHQ-9) is a self-report measure that assesses symptoms and severity of depression over the course of the last two weeks. Brandalynn obtained a score of 12 suggesting moderate depression. Mickie finds the endorsed symptoms to be somewhat difficult. [0= Not at all; 1= Several days; 2= More than half the days; 3= Nearly every day] Little interest or pleasure in doing things 0  Feeling down, depressed, or hopeless 1  Trouble falling or staying asleep, or sleeping too much 0  Feeling tired or having little energy 2  Poor appetite or overeating 3  Feeling bad about yourself --- or that you are a failure or have let yourself or your family down 3  Trouble concentrating on things, such as reading the newspaper or watching television 3  Moving or speaking so slowly that other people could have noticed? Or the opposite --- being so fidgety or restless that you have been moving around a lot more than usual 0  Thoughts that you would be better off dead or hurting yourself in some way 0  PHQ-9 Score 12    The Generalized Anxiety Disorder-7 (GAD-7) is a brief self-report measure that assesses   symptoms of anxiety over the course of the last two weeks. Clarabelle obtained a score of 0. [0= Not at all; 1= Several days; 2= Over half the days; 3= Nearly every day] Feeling  nervous, anxious, on edge 0  Not being able to stop or control worrying 0  Worrying too much about different things 0  Trouble relaxing 0  Being so restless that it's hard to sit still 0  Becoming easily annoyed or irritable 0  Feeling afraid as if something awful might happen 0  GAD-7 Score 0   Interventions:  Conducted a chart review Focused on rapport building Verbally administered PHQ-9 and GAD-7 for symptom monitoring Verbally administered Food & Mood questionnaire to assess various behaviors related to emotional eating. Provided emphatic reflections and validation Collaborated with patient on a treatment goal  Psychoeducation provided regarding physical versus emotional hunger  Provisional DSM-5 Diagnosis(es): 311 (F32.8) Other Specified Depressive Disorder, Emotional Eating Behaviors  Plan: Peg appears able and willing to participate as evidenced by collaboration on a treatment goal, engagement in reciprocal conversation, and asking questions as needed for clarification. The next appointment will be scheduled in two weeks, which will be via [employer] WebEx. The following treatment goal was established: decrease emotional eating. This provider will regularly review the treatment plan and medical chart to keep informed of status changes. Jackquline expressed understanding and agreement with the initial treatment plan of care. Trace will be sent a handout via e-mail to utilize between now and the next appointment to increase awareness of hunger patterns and subsequent eating. Hooria provided verbal consent during today's appointment for this provider to send the handout via e-mail.      

## 2019-10-04 NOTE — Progress Notes (Signed)
Chief Complaint:   Crystal Madden (MR# [id number]) is a 44 y.o. female who presents for evaluation and treatment of obesity and related comorbidities. Current BMI is Body mass index is 37.12 kg/m. Crystal Madden has been struggling with her weight for many years and has been unsuccessful in either losing weight, maintaining weight loss, or reaching her healthy weight goal.  Crystal Madden is currently in the action stage of change and ready to dedicate time achieving and maintaining a healthier weight. Crystal Madden is interested in becoming our patient and working on intensive lifestyle modifications including (but not limited to) diet and exercise for weight loss.  Crystal Madden's habits were reviewed today and are as follows: Her family eats meals together, she thinks her family will eat healthier with her, her desired weight loss is 70 lbs, she has been heavy most of her life, she started gaining weight after her babies, her heaviest weight ever was 230 pounds, she has significant food cravings issues, she is frequently drinking liquids with calories, she frequently makes poor food choices, she frequently eats larger portions than normal and she struggles with emotional eating.  Depression Screen Crystal Madden's Food and Mood (modified PHQ-9) score was 17.  Depression screen Crystal Madden 2/9 10/04/2019  Decreased Interest 2  Down, Depressed, Hopeless 3  PHQ - 2 Score 5  Altered sleeping 1  Tired, decreased energy 3  Change in appetite 3  Feeling bad or failure about yourself  2  Trouble concentrating 3  Moving slowly or fidgety/restless 0  Suicidal thoughts 0  PHQ-9 Score 17  Difficult doing work/chores Somewhat difficult   Subjective:   1. Other fatigue Crystal Madden admits to daytime somnolence and admits to waking up still tired. Patent has a history of symptoms of daytime fatigue. Crystal Madden generally gets 6 hours of sleep per night, and states that she has difficulty falling asleep. Snoring is  present. Apneic episodes are not present. Epworth Sleepiness Score is 7.  2. Shortness of breath on exertion Crystal Madden notes increasing shortness of breath with exercising and seems to be worsening over time with weight gain. She notes getting out of breath sooner with activity than she used to. This has not gotten worse recently. Crystal Madden denies shortness of breath at rest or orthopnea.  3. Essential hypertension Crystal Madden's blood pressure is stable on her medications. She denies chest pain. She is attempting to improve with diet and weight loss.  4. Other hyperlipidemia Crystal Madden is not on statin, and she denies chest pain. She is attempting to improve with diet and weight loss.  5. Depression with anxiety Crystal Madden is not on medications, and she notes her emotional eating has worsened this last month.  6. Hyperglycemia Crystal Madden has a history of some elevated blood glucose readings without a diagnosis of diabetes. She denies polyphagia.  7. At risk for heart disease Crystal Madden is at a higher than average risk for cardiovascular disease due to obesity. Reviewed: no chest pain on exertion, no dyspnea on exertion, and no swelling of ankles.  Assessment/Plan:   1. Other fatigue Crystal Madden does feel that her weight is causing her energy to be lower than it should be. Fatigue may be related to obesity, depression or many other causes. Labs will be ordered, and in the meanwhile, Crystal Madden will focus on self care including making healthy food choices, increasing physical activity and focusing on stress reduction.  - EKG 12-Lead - CBC with Differential/Platelet - Folate - T3 - T4, free - TSH - Vitamin B12 -  VITAMIN D 25 Hydroxy (Vit-D Deficiency, Fractures)  2. Shortness of breath on exertion Crystal Madden does feel that she gets out of breath more easily that she used to when she exercises. Crystal Madden's shortness of breath appears to be obesity related and exercise induced. She has agreed to work on  weight loss and gradually increase exercise to treat her exercise induced shortness of breath. Will continue to monitor closely.  3. Essential hypertension Crystal Madden will start her diet, and will work on healthy weight loss and exercise to improve blood pressure control. We will watch for signs of hypotension as she continues her lifestyle modifications. We will check labs today.  - Comprehensive metabolic panel  4. Other hyperlipidemia Cardiovascular risk and specific lipid/LDL goals reviewed. We discussed several lifestyle modifications today. Crystal Madden will start her diet, and will work on exercise and weight loss efforts. We will check labs today. Orders and follow up as documented in patient record.   - Lipid Panel With LDL/HDL Ratio  5. Depression with anxiety Behavior modification techniques were discussed today to help Crystal Madden deal with her emotional/non-hunger eating behaviors. We will refer to Dr. Mallie Mussel, our Bariatric Psychologist for evaluation. Orders and follow up as documented in patient record.   6. Hyperglycemia Fasting labs will be obtained today and results with be discussed with Crystal Madden in 2 weeks at her follow up visit. In the meanwhile Crystal Madden was started on a lower simple carbohydrate diet and will work on weight loss efforts.  - Hemoglobin A1c - Insulin, random  7. At risk for heart disease Crystal Madden was given approximately 30 minutes of coronary artery disease prevention counseling today. She is 44 y.o. female and has risk factors for heart disease including obesity. We discussed intensive lifestyle modifications today with an emphasis on specific weight loss instructions and strategies.   Repetitive spaced learning was employed today to elicit superior memory formation and behavioral change.  8. Class 2 severe obesity with serious comorbidity and body mass index (BMI) of 37.0 to 37.9 in adult, unspecified obesity type Crystal Madden) Crystal Madden is currently in the action  stage of change and her goal is to continue with weight loss efforts. I recommend Crystal Madden begin the structured treatment plan as follows:  She has agreed to the Category 3 Plan.  Exercise goals: No exercise has been prescribed for now, while we concentrate on nutritional changes.   Behavioral modification strategies: increasing lean protein intake, meal planning and cooking strategies and dealing with family or coworker sabotage.  She was informed of the importance of frequent follow-up visits to maximize her success with intensive lifestyle modifications for her multiple health conditions. She was informed we would discuss her lab results at her next visit unless there is a critical issue that needs to be addressed sooner. [place] agreed to keep her next visit at the agreed upon time to discuss these results.  Objective:   Blood pressure 133/81, pulse 81, temperature 97.6 F (36.4 C), temperature source Oral, height 5\' 6"  (1.676 m), weight 230 lb (104.3 kg), SpO2 99 %. Body mass index is 37.12 kg/m.  EKG: Normal sinus rhythm, rate 76 BPM.  Indirect Calorimeter completed today shows a VO2 of 352 and a REE of 2448.  Her calculated basal metabolic rate is [id number] thus her basal metabolic rate is better than expected.  General: Cooperative, alert, well developed, in no acute distress. HEENT: Conjunctivae and lids unremarkable. Cardiovascular: Regular rhythm.  Lungs: Normal work of breathing. Neurologic: No focal deficits.   Lab Results  Component Value Date   CREATININE 0.77 05/24/2019   BUN 16 05/24/2019   NA 138 05/24/2019   K 3.6 05/24/2019   CL 106 05/24/2019   CO2 23 05/24/2019   Lab Results  Component Value Date   ALT 23 05/24/2019   AST 19 05/24/2019   ALKPHOS 54 05/24/2019   BILITOT 0.5 05/24/2019   Lab Results  Component Value Date   HGBA1C 5.3 05/31/2019   HGBA1C 5.0 12/14/2017   No results found for: INSULIN Lab Results  Component Value Date   TSH 1.74  05/24/2019   Lab Results  Component Value Date   CHOL 232 (H) 05/31/2019   HDL 42.50 05/31/2019   LDLDIRECT 141.0 05/31/2019   TRIG 396.0 (H) 05/31/2019   CHOLHDL 5 05/31/2019   Lab Results  Component Value Date   WBC 8.2 05/24/2019   HGB 13.9 05/24/2019   HCT 40.2 05/24/2019   MCV 91.3 05/24/2019   PLT 239.0 05/24/2019   No results found for: IRON, TIBC, FERRITIN  Attestation Statements:   Reviewed by clinician on day of visit: allergies, medications, problem list, medical history, surgical history, family history, social history, and previous encounter notes.   I, Trixie Dredge, am acting as transcriptionist for Dennard Nip, MD.  I have reviewed the above documentation for accuracy and completeness, and I agree with the above. - Dennard Nip, MD

## 2019-10-05 ENCOUNTER — Encounter: Payer: No Typology Code available for payment source | Admitting: Physical Therapy

## 2019-10-05 ENCOUNTER — Ambulatory Visit (INDEPENDENT_AMBULATORY_CARE_PROVIDER_SITE_OTHER): Payer: No Typology Code available for payment source | Admitting: Psychology

## 2019-10-05 ENCOUNTER — Ambulatory Visit: Attending: Internal Medicine

## 2019-10-05 DIAGNOSIS — F3289 Other specified depressive episodes: Secondary | ICD-10-CM | POA: Diagnosis not present

## 2019-10-05 DIAGNOSIS — Z20822 Contact with and (suspected) exposure to covid-19: Secondary | ICD-10-CM

## 2019-10-05 LAB — VITAMIN D 25 HYDROXY (VIT D DEFICIENCY, FRACTURES): Vit D, 25-Hydroxy: 28.5 ng/mL — ABNORMAL LOW (ref 30.0–100.0)

## 2019-10-05 LAB — COMPREHENSIVE METABOLIC PANEL
ALT: 23 IU/L (ref 0–32)
AST: 23 IU/L (ref 0–40)
Albumin/Globulin Ratio: 2 (ref 1.2–2.2)
Albumin: 4.7 g/dL (ref 3.8–4.8)
Alkaline Phosphatase: 53 IU/L (ref 39–117)
BUN/Creatinine Ratio: 14 (ref 9–23)
BUN: 14 mg/dL (ref 6–24)
Bilirubin Total: 0.9 mg/dL (ref 0.0–1.2)
CO2: 23 mmol/L (ref 20–29)
Calcium: 9.7 mg/dL (ref 8.7–10.2)
Chloride: 101 mmol/L (ref 96–106)
Creatinine, Ser: 0.97 mg/dL (ref 0.57–1.00)
GFR calc Af Amer: 84 mL/min/{1.73_m2} (ref >59)
GFR calc non Af Amer: 73 mL/min/{1.73_m2} (ref >59)
Globulin, Total: 2.4 g/dL (ref 1.5–4.5)
Glucose: 96 mg/dL (ref 65–99)
Potassium: 4.1 mmol/L (ref 3.5–5.2)
Sodium: 140 mmol/L (ref 134–144)
Total Protein: 7.1 g/dL (ref 6.0–8.5)

## 2019-10-05 LAB — VITAMIN B12: Vitamin B-12: 398 pg/mL (ref 232–1245)

## 2019-10-05 LAB — TSH: TSH: 3.29 u[IU]/mL (ref 0.450–4.500)

## 2019-10-05 LAB — HEMOGLOBIN A1C
Est. average glucose Bld gHb Est-mCnc: 108 mg/dL
Hgb A1c MFr Bld: 5.4 % (ref 4.8–5.6)

## 2019-10-05 LAB — T4, FREE: Free T4: 1.22 ng/dL (ref 0.82–1.77)

## 2019-10-05 LAB — CBC WITH DIFFERENTIAL/PLATELET
Basophils Absolute: 0.1 10*3/uL (ref 0.0–0.2)
Basos: 1 %
EOS (ABSOLUTE): 0.1 10*3/uL (ref 0.0–0.4)
Eos: 2 %
Hematocrit: 42.1 % (ref 34.0–46.6)
Hemoglobin: 14.5 g/dL (ref 11.1–15.9)
Immature Grans (Abs): 0.1 10*3/uL (ref 0.0–0.1)
Immature Granulocytes: 1 %
Lymphocytes Absolute: 2 10*3/uL (ref 0.7–3.1)
Lymphs: 25 %
MCH: 32.7 pg (ref 26.6–33.0)
MCHC: 34.4 g/dL (ref 31.5–35.7)
MCV: 95 fL (ref 79–97)
Monocytes Absolute: 0.5 10*3/uL (ref 0.1–0.9)
Monocytes: 6 %
Neutrophils Absolute: 5.3 10*3/uL (ref 1.4–7.0)
Neutrophils: 65 %
Platelets: 216 10*3/uL (ref 150–450)
RBC: 4.43 x10E6/uL (ref 3.77–5.28)
RDW: 12.9 % (ref 11.7–15.4)
WBC: 8 10*3/uL (ref 3.4–10.8)

## 2019-10-05 LAB — LIPID PANEL WITH LDL/HDL RATIO
Cholesterol, Total: 209 mg/dL — ABNORMAL HIGH (ref 100–199)
HDL: 46 mg/dL (ref >39)
LDL Chol Calc (NIH): 130 mg/dL — ABNORMAL HIGH (ref 0–99)
LDL/HDL Ratio: 2.8 ratio (ref 0.0–3.2)
Triglycerides: 184 mg/dL — ABNORMAL HIGH (ref 0–149)
VLDL Cholesterol Cal: 33 mg/dL (ref 5–40)

## 2019-10-05 LAB — T3: T3, Total: 152 ng/dL (ref 71–180)

## 2019-10-05 LAB — FOLATE: Folate: 17.4 ng/mL (ref >3.0)

## 2019-10-05 LAB — INSULIN, RANDOM: INSULIN: 26.4 u[IU]/mL — ABNORMAL HIGH (ref 2.6–24.9)

## 2019-10-06 LAB — NOVEL CORONAVIRUS, NAA: SARS-CoV-2, NAA: NOT DETECTED

## 2019-10-06 NOTE — Progress Notes (Signed)
  Office: [phone]  /  Fax: [phone]    Date: October 20, 2019   Appointment Start Time: 2:30pm Duration: 24 minutes Provider: Glennie Isle, Psy.D. Type of Session: Individual Therapy  Location of Patient: Home Location of Provider: Provider's Home Type of Contact: Telepsychological Visit via [employer]  Session Content: Crystal Madden is a 44 y.o. female presenting via [place] for a follow-up appointment to address the previously established treatment goal of decreasing emotional eating. Today's appointment was a telepsychological visit due to COVID-19. [name] provided verbal consent for today's telepsychological appointment and she is aware she is responsible for securing confidentiality on her end of the session. Prior to proceeding with today's appointment, Turner's physical location at the time of this appointment was obtained as well a phone number she could be reached at in the event of technical difficulties. Ilhan and this provider participated in today's telepsychological service.   This provider conducted a brief check-in. Shiffon shared about her recent trip to [place], adding she lost two pounds. She also shared about a recent ER visit. Moreover, emotional and physical hunger was reviewed. Additionally, psychoeducation regarding triggers for emotional eating was provided. Menaal was provided a handout, and encouraged to utilize the handout between now and the next appointment to increase awareness of triggers and frequency. [name] agreed. This provider also discussed behavioral strategies for specific triggers, such as placing the utensil down when conversing to avoid mindless eating. [name] provided verbal consent during today's appointment for this provider to send a handout about triggers via e-mail. [name] was receptive to today's appointment as evidenced by openness to sharing, responsiveness to feedback, and willingness to explore triggers for emotional  eating.  Mental Status Examination:  Appearance: well groomed and appropriate hygiene  Behavior: appropriate to circumstances Mood: euthymic Affect: mood congruent Speech: normal in rate, volume, and tone Eye Contact: appropriate Psychomotor Activity: appropriate Gait: unable to assess Thought Process: linear, logical, and goal directed  Thought Content/Perception: no hallucinations, delusions, bizarre thinking or behavior reported or observed and no evidence of suicidal and homicidal ideation, plan, and intent Orientation: time, person, place and purpose of appointment Memory/Concentration: memory, attention, language, and fund of knowledge intact  Insight/Judgment: good  Interventions:  Conducted a brief chart review Provided empathic reflections and validation Employed supportive psychotherapy interventions to facilitate reduced distress and to improve coping skills with identified stressors Employed motivational interviewing skills to assess patient's willingness/desire to adhere to recommended medical treatments and assignments Psychoeducation provided regarding triggers for emotional eating  DSM-5 Diagnosis(es): 311 (F32.8) Other Specified Depressive Disorder, Emotional Eating Behaviors  Treatment Goal & Progress: During the initial appointment with this provider, the following treatment goal was established: decrease emotional eating. Lavell has demonstrated progress in her goal as evidenced by increased awareness of hunger patterns.   Plan: The next appointment will be scheduled in two weeks, which will be via [employer]. The next session will focus on working towards the established treatment goal.

## 2019-10-17 MED FILL — VALSARTAN-HCTZ 80-12.5 MG T: 80-12.5 | 30 days supply | Qty: 30 | Fill #4

## 2019-10-18 ENCOUNTER — Other Ambulatory Visit: Payer: Self-pay

## 2019-10-18 ENCOUNTER — Encounter (INDEPENDENT_AMBULATORY_CARE_PROVIDER_SITE_OTHER): Payer: Self-pay | Admitting: Family Medicine

## 2019-10-18 ENCOUNTER — Ambulatory Visit (INDEPENDENT_AMBULATORY_CARE_PROVIDER_SITE_OTHER): Payer: No Typology Code available for payment source | Admitting: Family Medicine

## 2019-10-18 ENCOUNTER — Telehealth (INDEPENDENT_AMBULATORY_CARE_PROVIDER_SITE_OTHER): Payer: No Typology Code available for payment source | Admitting: Family Medicine

## 2019-10-18 VITALS — BP 137/88 | HR 94 | Temp 98.1°F | Ht 66.0 in | Wt 228.0 lb

## 2019-10-18 DIAGNOSIS — E559 Vitamin D deficiency, unspecified: Secondary | ICD-10-CM

## 2019-10-18 DIAGNOSIS — E7849 Other hyperlipidemia: Secondary | ICD-10-CM | POA: Diagnosis not present

## 2019-10-18 DIAGNOSIS — E8881 Metabolic syndrome: Secondary | ICD-10-CM | POA: Diagnosis not present

## 2019-10-18 DIAGNOSIS — Z6836 Body mass index (BMI) 36.0-36.9, adult: Secondary | ICD-10-CM

## 2019-10-18 DIAGNOSIS — Z9189 Other specified personal risk factors, not elsewhere classified: Secondary | ICD-10-CM | POA: Diagnosis not present

## 2019-10-18 MED ORDER — VITAMIN D (ERGOCALCIFEROL) 1.25 MG (50000 UNIT) PO CAPS: 50000.0000 [IU] | ORAL_CAPSULE | ORAL | 0 refills | Status: DC

## 2019-10-18 MED ORDER — METFORMIN HCL 500 MG PO TABS: 500.0000 mg | ORAL_TABLET | Freq: Every morning | ORAL | 0 refills | Status: DC

## 2019-10-18 MED FILL — metFORMIN HCL 500 MG TABS: 500 | 30 days supply | Qty: 30 | Fill #0

## 2019-10-18 MED FILL — VIT D2 1.25 MG (50,000 UNIT: 1.25 MG | 28 days supply | Qty: 4 | Fill #0

## 2019-10-18 NOTE — Progress Notes (Signed)
Chief Complaint:   OBESITY Crystal Madden is here to discuss her progress with her obesity treatment plan along with follow-up of her obesity related diagnoses. Crystal Madden is on the Category 3 Plan and states she is following her eating plan approximately 100% of the time. Crystal Madden states she is doing 0 minutes 0 times per week.  Today's visit was #: 2 Starting weight: 230 lbs Starting date: 10/04/2019 Today's weight: 228 lbs Today's date: 10/18/2019 Total lbs lost to date: 2 Total lbs lost since last in-office visit: 2  Interim History: Crystal Madden did well with weight loss on her Category 3 plan. Her hunger was mostly controlled, but she struggled with late PM snacking.  Subjective:   1. Mixed hyperlipidemia Crystal Madden has an elevated LDL and triglycerides. She is not on statin and would like to improve with diet. I discussed labs with the patient today.  2. Vitamin D deficiency Crystal Madden is not on Vit D and her level is low. She notes fatigue. I discussed labs with the patient today.  3. Insulin resistance Crystal Madden has a new diagnosis of insulin resistance. She has a normal glucose and A1c, but elevated fasting insulin consistent  With insulin resistance. She notes PM polyphagia. I discussed labs with the patient today.  4. At risk for diabetes mellitus Crystal Madden is at higher than average risk for developing diabetes due to her obesity.   Assessment/Plan:   1. Mixed hyperlipidemia Cardiovascular risk and specific lipid/LDL goals reviewed. We discussed several lifestyle modifications today and Parlee will continue to work on diet, exercise and weight loss efforts. We will recheck labs in 3 months. Orders and follow up as documented in patient record.   2. Vitamin D deficiency Low Vitamin D level contributes to fatigue and are associated with obesity, breast, and colon cancer. Crystal Madden agreed to start prescription Vitamin D 50,000 IU every week with no refills. She will follow-up for  routine testing of Vitamin D, at least 2-3 times per year to avoid over-replacement.  - Vitamin D, Ergocalciferol, (DRISDOL) 1.25 MG (50000 UNIT) CAPS capsule; Take 1 capsule (50,000 Units total) by mouth every 7 (seven) days.  Dispense: 4 capsule; Refill: 0  3. Insulin resistance Crystal Madden will continue to work on weight loss, exercise, and decreasing simple carbohydrates to help decrease the risk of diabetes. Crystal Madden agreed to start metformin 500 mg daily with no refills. We will recheck labs in 3 months. Crystal Madden agreed to follow-up with [place] as directed to closely monitor her progress.  - metFORMIN (GLUCOPHAGE) 500 MG tablet; Take 1 tablet (500 mg total) by mouth every morning.  Dispense: 30 tablet; Refill: 0  4. At risk for diabetes mellitus Crystal Madden was given approximately 30 minutes of diabetes education and counseling today. We discussed intensive lifestyle modifications today with an emphasis on weight loss as well as increasing exercise and decreasing simple carbohydrates in her diet. We also reviewed medication options with an emphasis on risk versus benefit of those discussed.   Repetitive spaced learning was employed today to elicit superior memory formation and behavioral change.  5. Class 2 severe obesity with serious comorbidity and body mass index (BMI) of 36.0 to 36.9 in adult, unspecified obesity type [facility]) Crystal Madden is currently in the action stage of change. As such, her goal is to continue with weight loss efforts. She has agreed to the Category 3 Plan. Lean meat equivalency.   Behavioral modification strategies: increasing lean protein intake, keeping healthy foods in the home and better snacking choices.  Crystal Madden has agreed to follow-up with our clinic in 2 weeks. She was informed of the importance of frequent follow-up visits to maximize her success with intensive lifestyle modifications for her multiple health conditions.   Objective:   Blood pressure 137/88, pulse 94,  temperature 98.1 F (36.7 C), temperature source Oral, height 5\' 6"  (1.676 m), weight 228 lb (103.4 kg), SpO2 97 %. Body mass index is 36.8 kg/m.  General: Cooperative, alert, well developed, in no acute distress. HEENT: Conjunctivae and lids unremarkable. Cardiovascular: Regular rhythm.  Lungs: Normal work of breathing. Neurologic: No focal deficits.   Lab Results  Component Value Date   CREATININE 0.97 10/04/2019   BUN 14 10/04/2019   NA 140 10/04/2019   K 4.1 10/04/2019   CL 101 10/04/2019   CO2 23 10/04/2019   Lab Results  Component Value Date   ALT 23 10/04/2019   AST 23 10/04/2019   ALKPHOS 53 10/04/2019   BILITOT 0.9 10/04/2019   Lab Results  Component Value Date   HGBA1C 5.4 10/04/2019   HGBA1C 5.3 05/31/2019   HGBA1C 5.0 12/14/2017   Lab Results  Component Value Date   INSULIN 26.4 (H) 10/04/2019   Lab Results  Component Value Date   TSH 3.290 10/04/2019   Lab Results  Component Value Date   CHOL 209 (H) 10/04/2019   HDL 46 10/04/2019   LDLCALC 130 (H) 10/04/2019   LDLDIRECT 141.0 05/31/2019   TRIG 184 (H) 10/04/2019   CHOLHDL 5 05/31/2019   Lab Results  Component Value Date   WBC 8.0 10/04/2019   HGB 14.5 10/04/2019   HCT 42.1 10/04/2019   MCV 95 10/04/2019   PLT 216 10/04/2019   No results found for: IRON, TIBC, FERRITIN  Attestation Statements:   Reviewed by clinician on day of visit: allergies, medications, problem list, medical history, surgical history, family history, social history, and previous encounter notes.   I, Crystal Madden, am acting as transcriptionist for Dennard Nip, MD.  I have reviewed the above documentation for accuracy and completeness, and I agree with the above. -  Dennard Nip, MD

## 2019-10-19 ENCOUNTER — Emergency Department (HOSPITAL_COMMUNITY): Payer: No Typology Code available for payment source

## 2019-10-19 ENCOUNTER — Emergency Department (HOSPITAL_COMMUNITY)
Admission: EM | Admit: 2019-10-19 | Discharge: 2019-10-20 | Disposition: A | Discharge status: Home / Self Care | Payer: No Typology Code available for payment source | Attending: Emergency Medicine | Admitting: Emergency Medicine

## 2019-10-19 ENCOUNTER — Other Ambulatory Visit: Payer: Self-pay

## 2019-10-19 ENCOUNTER — Encounter (HOSPITAL_COMMUNITY): Payer: Self-pay | Admitting: Emergency Medicine

## 2019-10-19 DIAGNOSIS — E119 Type 2 diabetes mellitus without complications: Secondary | ICD-10-CM | POA: Insufficient documentation

## 2019-10-19 DIAGNOSIS — F909 Attention-deficit hyperactivity disorder, unspecified type: Secondary | ICD-10-CM | POA: Diagnosis not present

## 2019-10-19 DIAGNOSIS — Z79899 Other long term (current) drug therapy: Secondary | ICD-10-CM | POA: Diagnosis not present

## 2019-10-19 DIAGNOSIS — I10 Essential (primary) hypertension: Secondary | ICD-10-CM | POA: Diagnosis not present

## 2019-10-19 DIAGNOSIS — Z7984 Long term (current) use of oral hypoglycemic drugs: Secondary | ICD-10-CM | POA: Insufficient documentation

## 2019-10-19 DIAGNOSIS — Z87891 Personal history of nicotine dependence: Secondary | ICD-10-CM | POA: Insufficient documentation

## 2019-10-19 DIAGNOSIS — R200 Anesthesia of skin: Secondary | ICD-10-CM | POA: Diagnosis not present

## 2019-10-19 DIAGNOSIS — R42 Dizziness and giddiness: Secondary | ICD-10-CM | POA: Diagnosis not present

## 2019-10-19 HISTORY — DX: Type 2 diabetes mellitus without complications: E11.9

## 2019-10-19 LAB — DIFFERENTIAL
Abs Immature Granulocytes: 0.02 10*3/uL (ref 0.00–0.07)
Basophils Absolute: 0 10*3/uL (ref 0.0–0.1)
Basophils Relative: 1 %
Eosinophils Absolute: 0.2 10*3/uL (ref 0.0–0.5)
Eosinophils Relative: 2 %
Immature Granulocytes: 0 %
Lymphocytes Relative: 23 %
Lymphs Abs: 1.9 10*3/uL (ref 0.7–4.0)
Monocytes Absolute: 0.6 10*3/uL (ref 0.1–1.0)
Monocytes Relative: 7 %
Neutro Abs: 5.6 10*3/uL (ref 1.7–7.7)
Neutrophils Relative %: 67 %

## 2019-10-19 LAB — CBC
HCT: 41.9 % (ref 36.0–46.0)
Hemoglobin: 14.3 g/dL (ref 12.0–15.0)
MCH: 32.5 pg (ref 26.0–34.0)
MCHC: 34.1 g/dL (ref 30.0–36.0)
MCV: 95.2 fL (ref 80.0–100.0)
Platelets: 215 10*3/uL (ref 150–400)
RBC: 4.4 MIL/uL (ref 3.87–5.11)
RDW: 11.9 % (ref 11.5–15.5)
WBC: 8.3 10*3/uL (ref 4.0–10.5)
nRBC: 0 % (ref 0.0–0.2)

## 2019-10-19 LAB — I-STAT BETA HCG BLOOD, ED (MC, WL, AP ONLY): I-stat hCG, quantitative: <5 m[IU]/mL (ref <5)

## 2019-10-19 LAB — COMPREHENSIVE METABOLIC PANEL
ALT: 36 U/L (ref 0–44)
AST: 34 U/L (ref 15–41)
Albumin: 4.3 g/dL (ref 3.5–5.0)
Alkaline Phosphatase: 44 U/L (ref 38–126)
Anion gap: 11 (ref 5–15)
BUN: 14 mg/dL (ref 6–20)
CO2: 23 mmol/L (ref 22–32)
Calcium: 10.1 mg/dL (ref 8.9–10.3)
Chloride: 103 mmol/L (ref 98–111)
Creatinine, Ser: 0.9 mg/dL (ref 0.44–1.00)
GFR calc Af Amer: >60 mL/min (ref >60)
GFR calc non Af Amer: >60 mL/min (ref >60)
Glucose, Bld: 107 mg/dL — ABNORMAL HIGH (ref 70–99)
Potassium: 3.9 mmol/L (ref 3.5–5.1)
Sodium: 137 mmol/L (ref 135–145)
Total Bilirubin: 1.2 mg/dL (ref 0.3–1.2)
Total Protein: 7.8 g/dL (ref 6.5–8.1)

## 2019-10-19 LAB — APTT: aPTT: 26 seconds (ref 24–36)

## 2019-10-19 LAB — PROTIME-INR
INR: 1 (ref 0.8–1.2)
Prothrombin Time: 12.7 seconds (ref 11.4–15.2)

## 2019-10-19 IMAGING — MR MR HEAD W/O CM
7 of 11 series · 25 of 48 positions shown · non-contrast
Comparison: Prior head CT from earlier same day.

CLINICAL DATA: Initial evaluation for acute right-sided numbness.

EXAM:
MRI HEAD WITHOUT CONTRAST
TECHNIQUE: Multiplanar, multiecho pulse sequences of the brain and surrounding
structures were obtained without intravenous contrast.

[Series 2: DWI · axial · 3.0mm · 0.94mm/px · z∈[-75,+75]mm · 8 of 106 slices shown (1 of 2)]
[im 1/106]
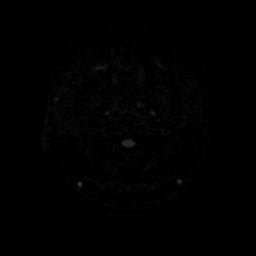
[im 16/106]
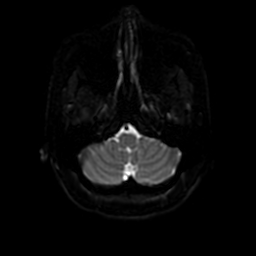
[im 31/106]
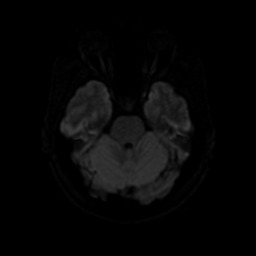
[im 46/106]
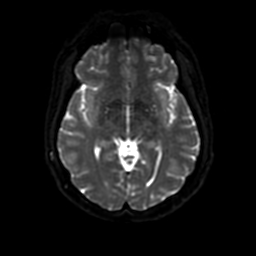
[im 61/106]
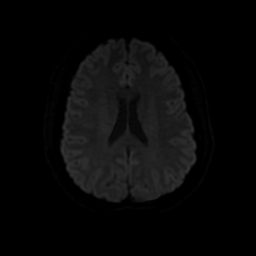
[im 76/106]
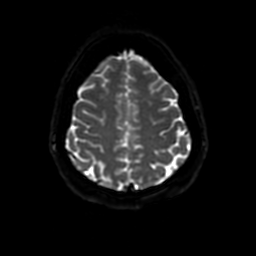
[im 91/106]
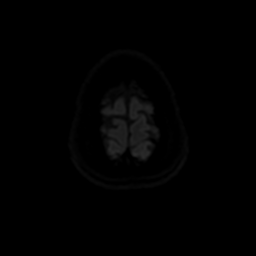
[im 106/106]
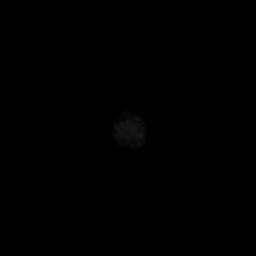

[Series 3: DWI · coronal · 4.0mm · 0.94mm/px · 5 of 74 slices shown (2 of 2)]
[im 1/74]
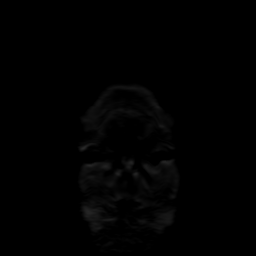
[im 19/74]
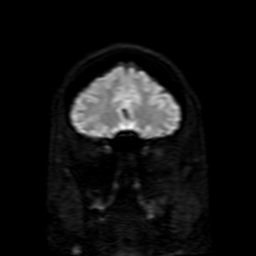
[im 37/74]
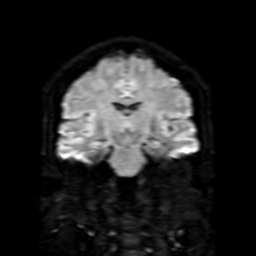
[im 55/74]
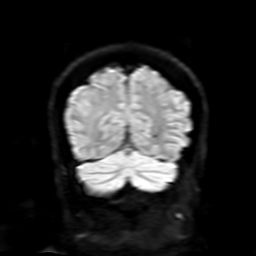
[im 74/74]
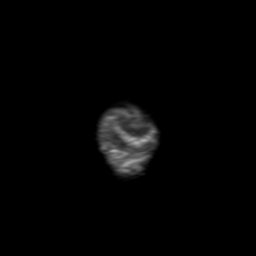

[Series 4: FLAIR · sagittal · 5.0mm · 0.23mm/px · 2 of 26 slices shown (1 of 2)]
[im 1/26]
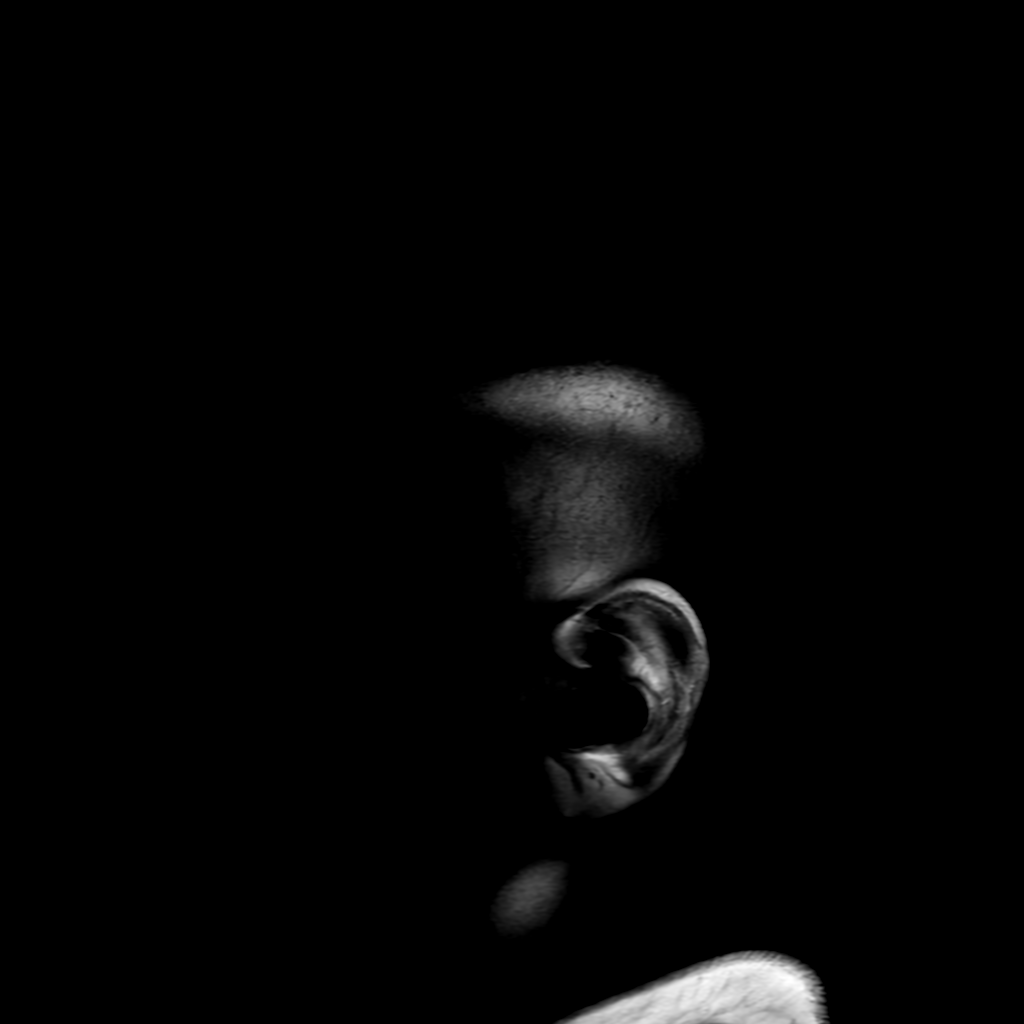
[im 26/26]
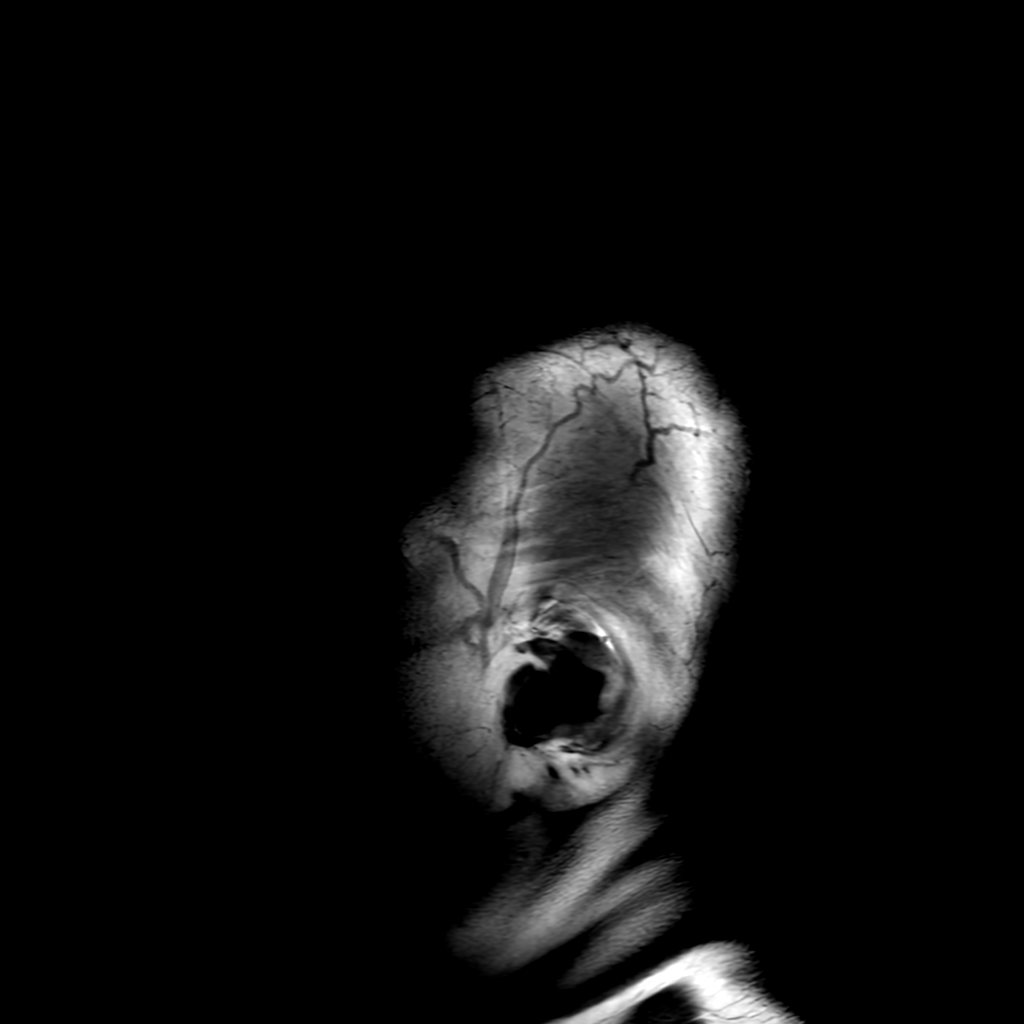

[Series 5: T2 · axial · 5.0mm · 0.23mm/px · 1 of 27 slices shown]
[im 1/27]
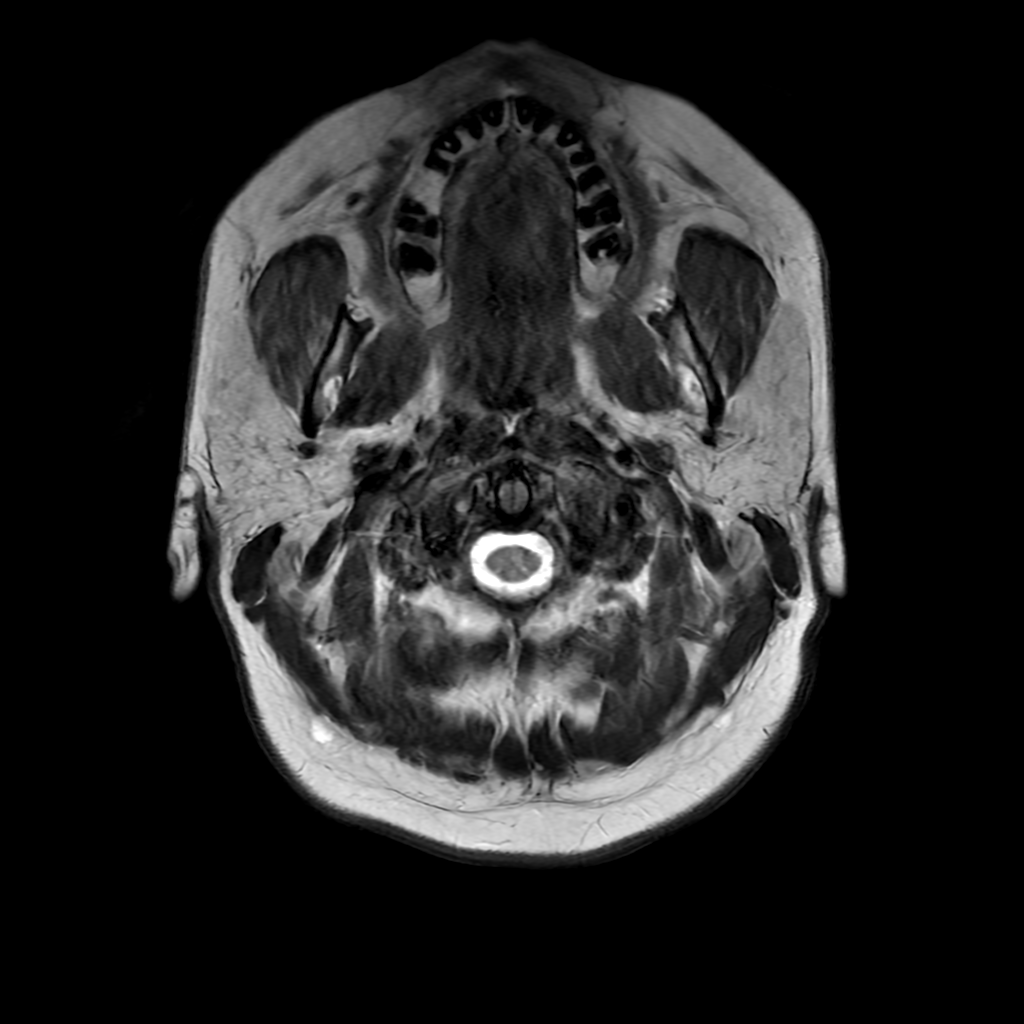

[Series 6: FLAIR · axial · 3.0mm · 0.41mm/px · z∈[-88,+55]mm · 2 of 27 slices shown (2 of 2)]
[im 1/27]
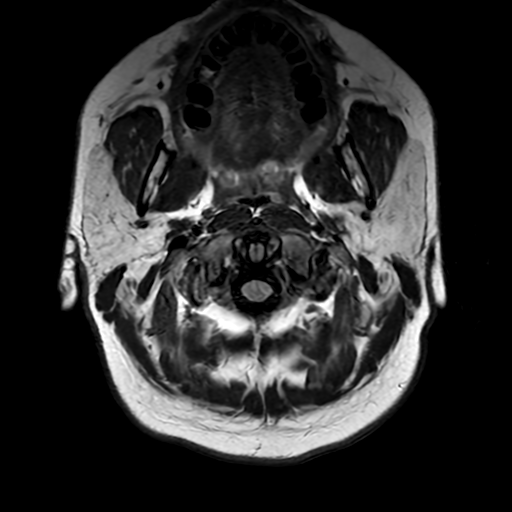
[im 27/27]
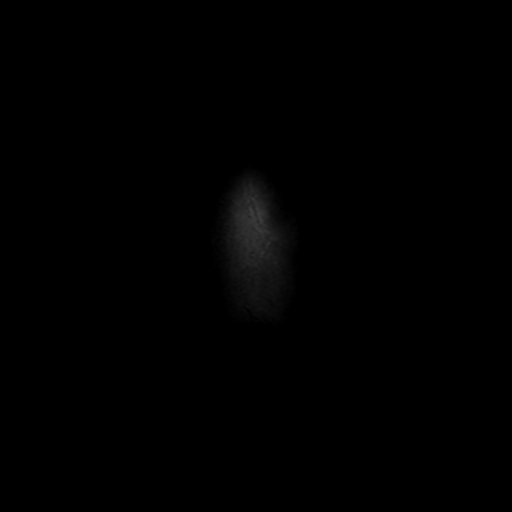

[Series 250: ADC · axial · 3.0mm · 0.94mm/px · z∈[-75,+75]mm · 4 of 53 slices shown (1 of 2)]
[im 1/53]
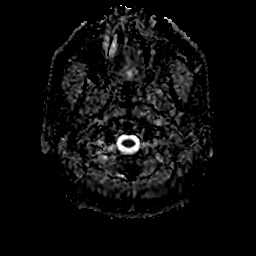
[im 18/53]
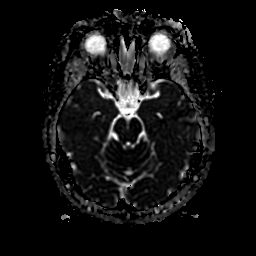
[im 35/53]
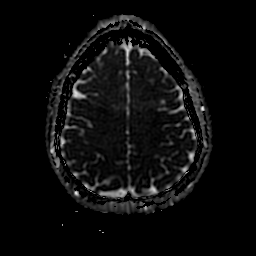
[im 53/53]
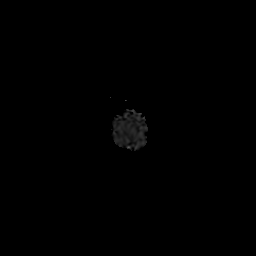

[Series 350: ADC · coronal · 4.0mm · 0.94mm/px · 3 of 37 slices shown (2 of 2)]
[im 1/37]
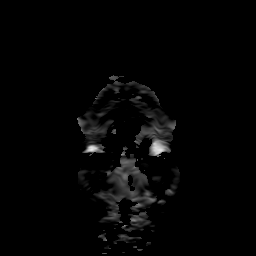
[im 19/37]
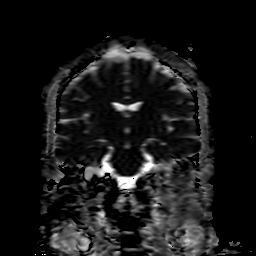
[im 37/37]
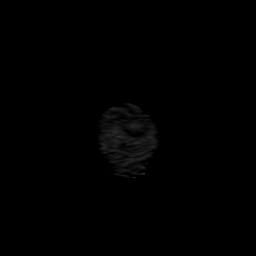

[25 of 48 positions shown; findings below may reference images not displayed]

FINDINGS: Brain: Cerebral volume within normal limits. Few punctate
subcentimeter foci of T2/FLAIR hyperintensity noted within the
subcortical/deep white matter of the anterior right frontal lobe,
nonspecific, but felt to be of doubtful significance. No other focal
parenchymal signal abnormality.

No abnormal foci of restricted diffusion to suggest acute or
subacute ischemia. Gray-white matter differentiation maintained. No
encephalomalacia to suggest chronic cortical infarction. No evidence
for acute or chronic intracranial hemorrhage.

No mass lesion, midline shift or mass effect. No hydrocephalus or
extra-axial fluid collection. Pituitary gland normal. Midline
structures intact.

Vascular: Major intracranial vascular flow voids are maintained.

Skull and upper cervical spine: Craniocervical junction normal. Bone
marrow signal intensity normal. No scalp soft tissue abnormality.

Sinuses/Orbits: Globes and orbital soft tissues within normal
limits. Paranasal sinuses are clear. No mastoid effusion. Inner ear
structures normal.

Other: None.
IMPRESSION: Normal brain MRI.  No acute intracranial abnormality identified.

## 2019-10-19 IMAGING — CT CT HEAD W/O CM
3 series · 16 of 47 positions shown, 19 images · non-contrast
Comparison: None.

CLINICAL DATA: Transient ischemic attack (TIA) Possible stroke
tingling in right arm and leg.

EXAM:
CT HEAD WITHOUT CONTRAST
TECHNIQUE: Contiguous axial images were obtained from the base of the skull
through the vertex without intravenous contrast.

[Series 3: head 5.0 h30s · axial · 0.43mm/px · z∈[-112,+23]mm · 10 of 33 slices shown, 13 images]
[im 3/33  brain]
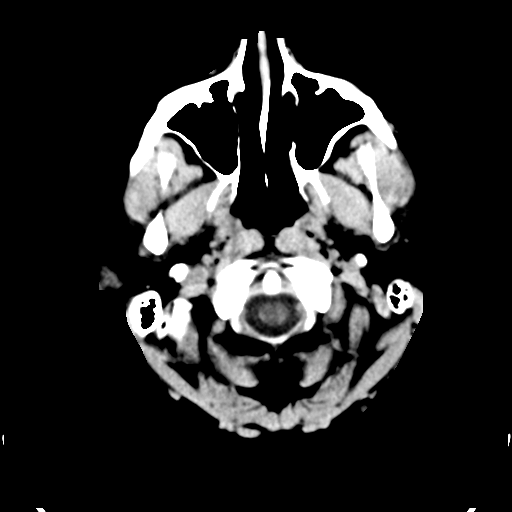
[im 3/33  bone]
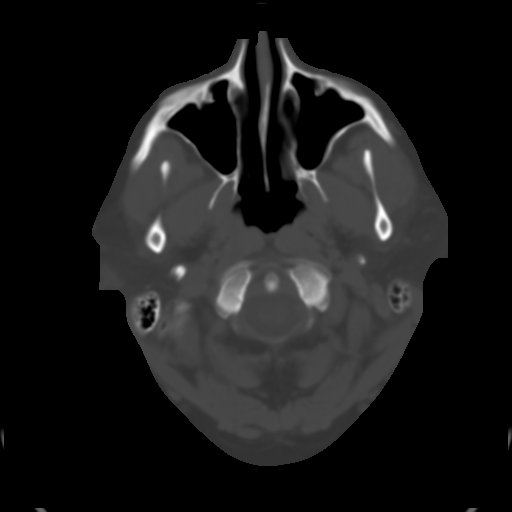
[im 6/33  brain]
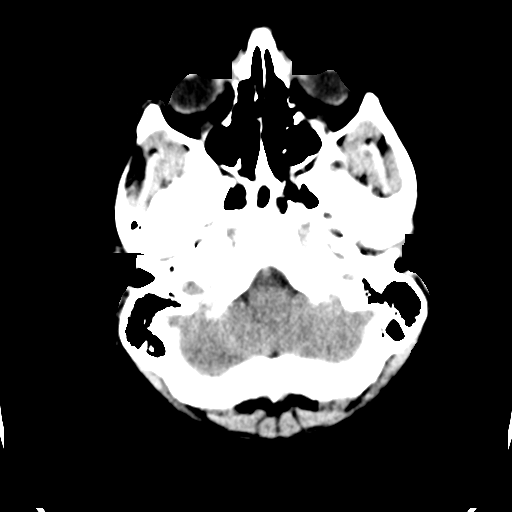
[im 9/33  brain]
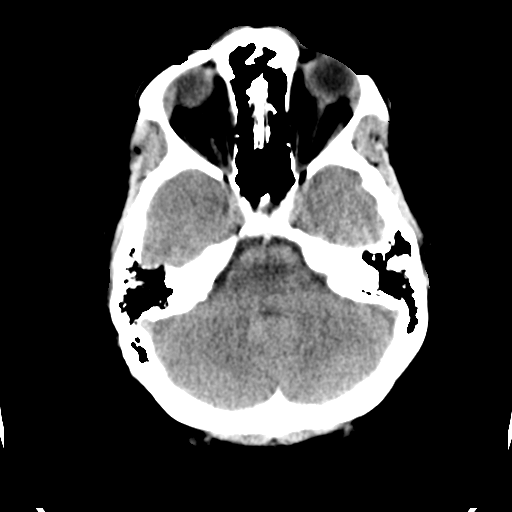
[im 12/33  brain]
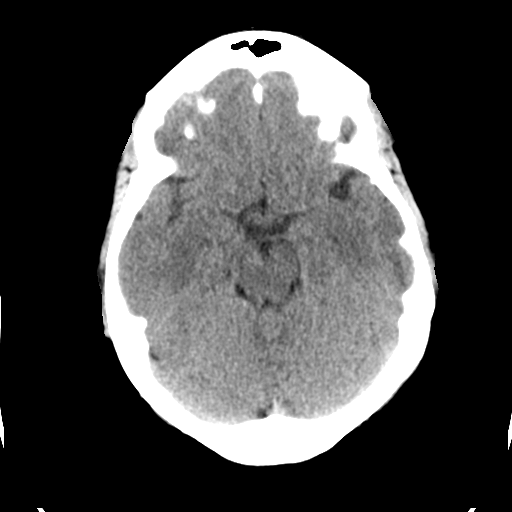
[im 15/33  brain]
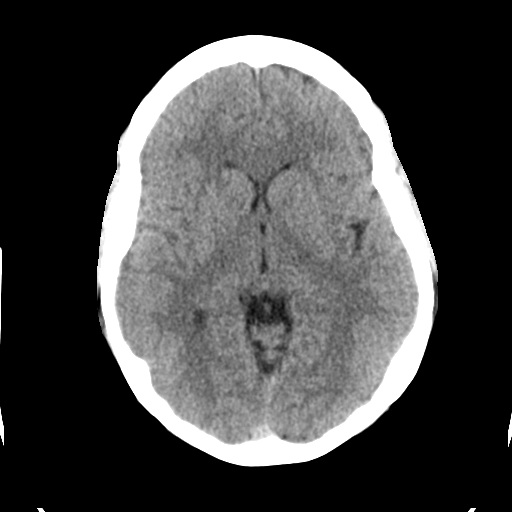
[im 15/33  bone]
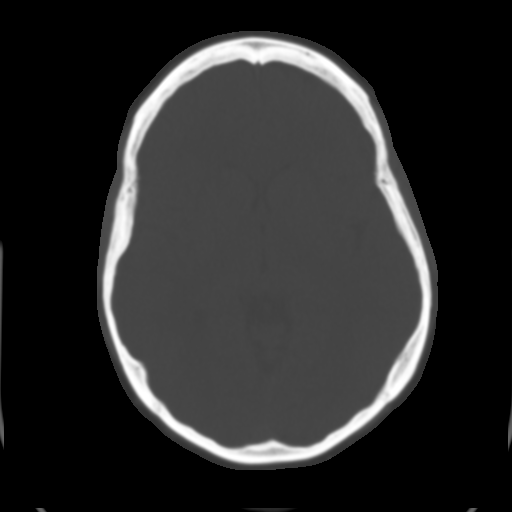
[im 18/33  brain]
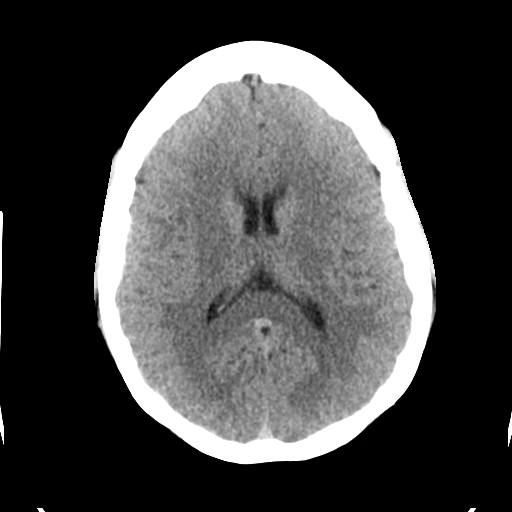
[im 21/33  brain]
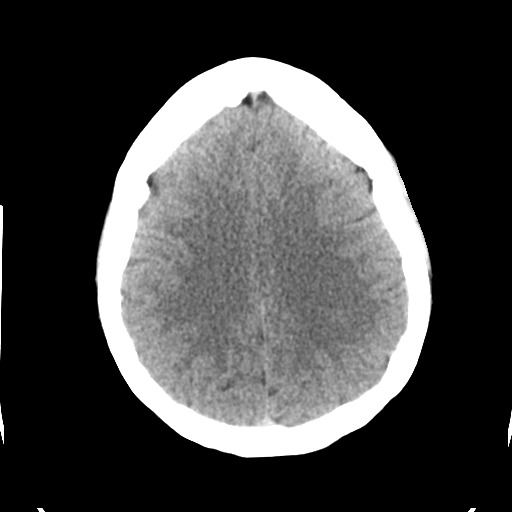
[im 25/33  brain]
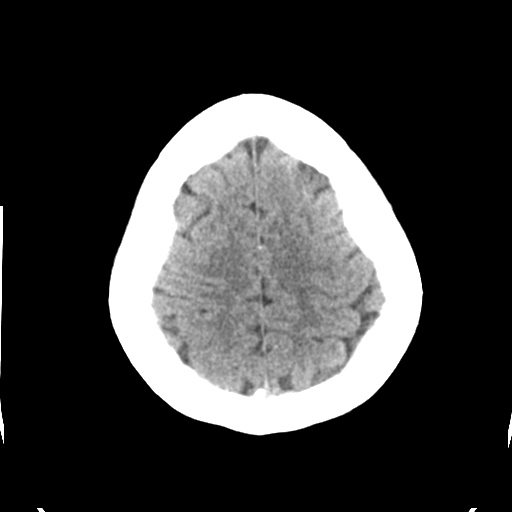
[im 27/33  brain]
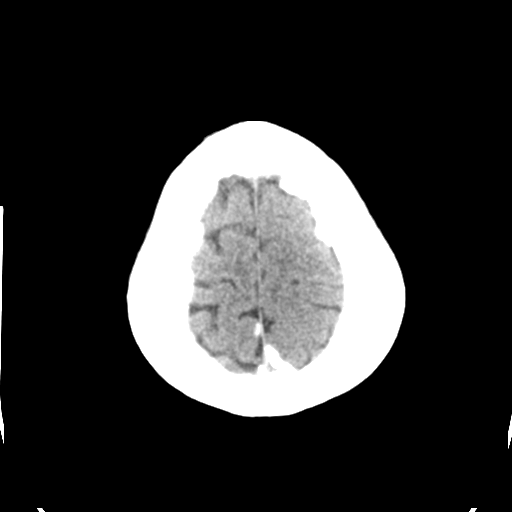
[im 27/33  bone]
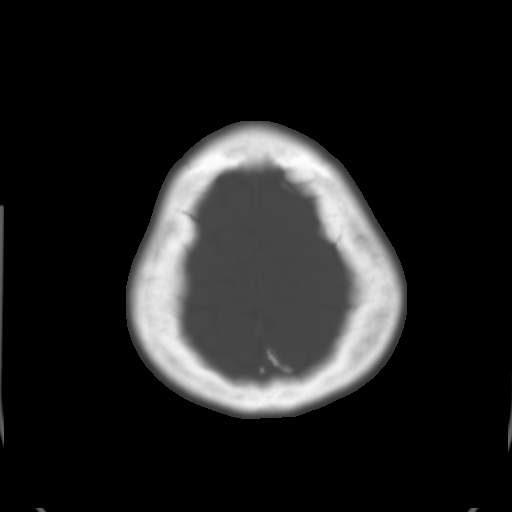
[im 30/33  brain]
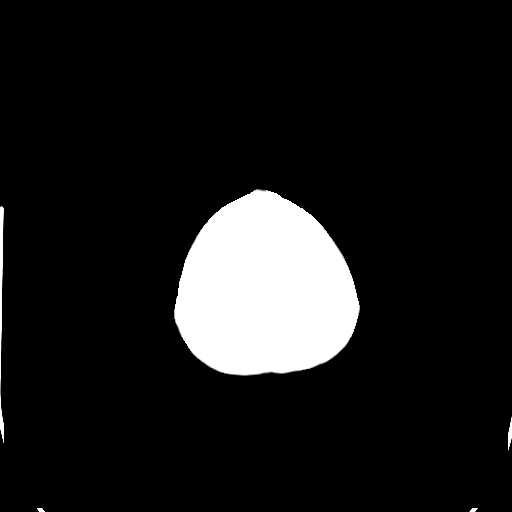

[Series 5: head 3.0 mpr cor · coronal · 0.34mm/px · 3 of 67 slices shown]
[im 23/67  brain]
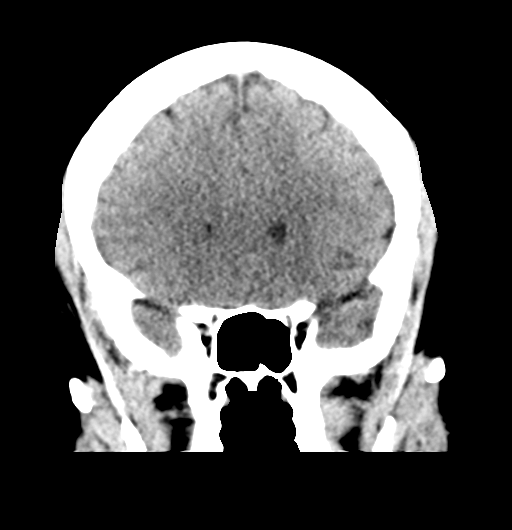
[im 30/67  brain]
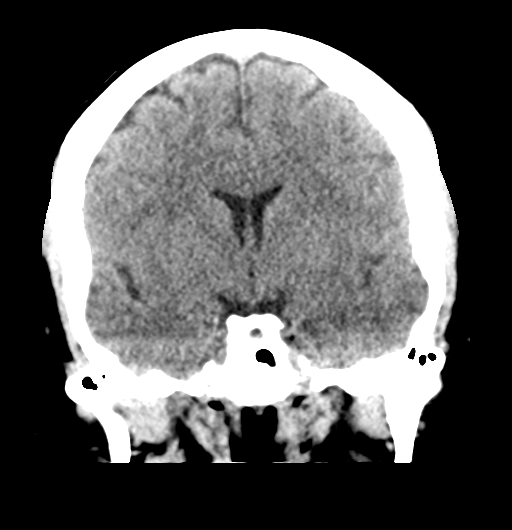
[im 37/67  brain]
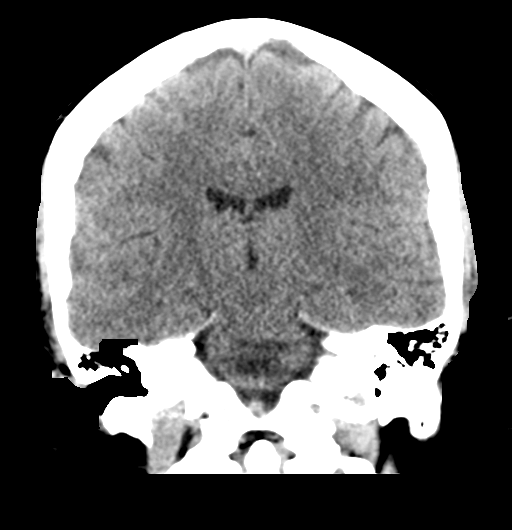

[Series 6: head 3.0 mpr sag · sagittal · 0.32mm/px · 3 of 67 slices shown]
[im 23/67  brain]
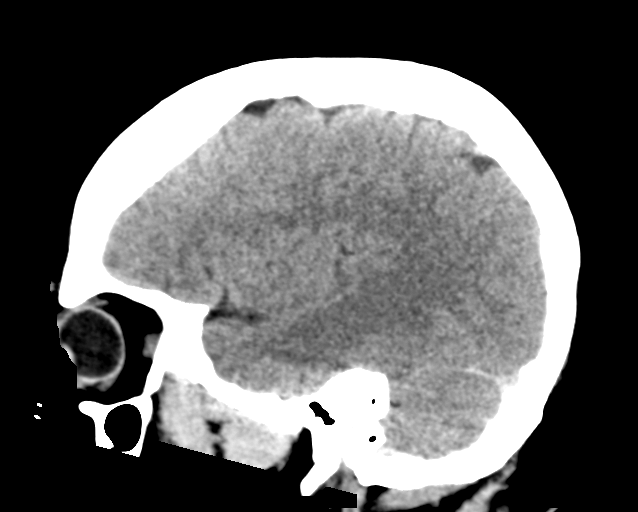
[im 34/67  brain]
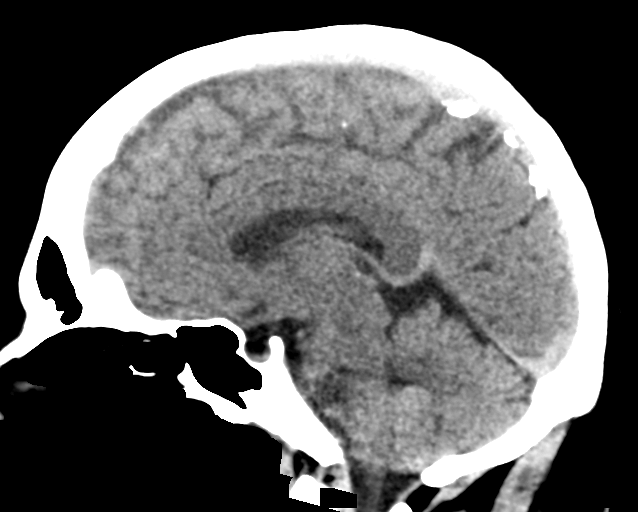
[im 45/67  brain]
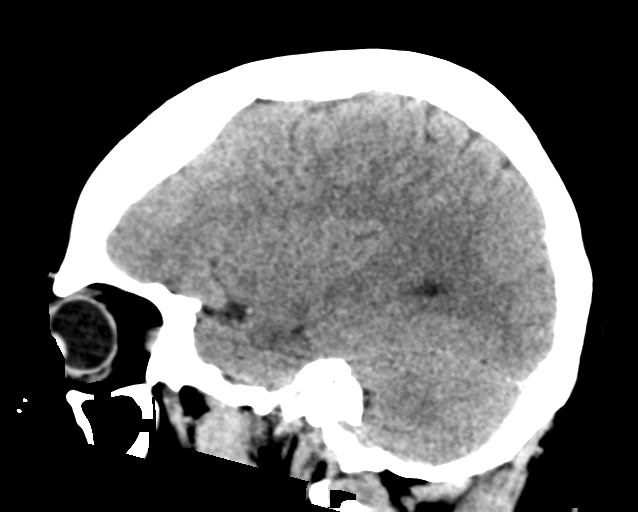

[16 of 47 positions shown; findings below may reference images not displayed]

FINDINGS: Brain: No intracranial hemorrhage, mass effect, or midline shift. No
hydrocephalus. The basilar cisterns are patent. No evidence of
territorial infarct or acute ischemia. No extra-axial or
intracranial fluid collection.

Vascular: No hyperdense vessel or unexpected calcification.

Skull: Normal. Negative for fracture or focal lesion.

Sinuses/Orbits: Paranasal sinuses and mastoid air cells are clear.
The visualized orbits are unremarkable.

Other: None.
IMPRESSION: Negative noncontrast head CT.

## 2019-10-19 MED ORDER — SODIUM CHLORIDE 0.9% FLUSH
3.0000 mL | Freq: Once | INTRAVENOUS | Status: DC
Start: 2019-10-19 — End: 2019-10-20

## 2019-10-19 MED ORDER — LORAZEPAM 2 MG/ML IJ SOLN
1.0000 mg | Freq: Once | INTRAMUSCULAR | Status: AC
  Administered 2019-10-19: 1 mg via INTRAVENOUS
  Filled 2019-10-19: qty 1

## 2019-10-19 NOTE — ED Notes (Signed)
Assumed care of pt. Pt alert, speaking in full sentences. Pt resting on cart in NAD. Breathing easy, non-labored. Hourly rounds completed. Will continue to monitor.

## 2019-10-19 NOTE — ED Provider Notes (Signed)
[facility] EMERGENCY DEPARTMENT Provider Note   CSN: [id number] Arrival date & time: 10/19/19  1848     History Chief Complaint  Patient presents with  . Numbness  . Dizziness    Crystal Madden is a 44 y.o. female.  The history is provided by the patient and medical records. No language interpreter was used.  Dizziness  Crystal Madden is a 44 y.o. female who presents to the Emergency Department complaining of numbness. She presents the emergency department complaining of numbness described as a tingling sensation to the right arm and right leg that started around noon today. The numbness has been fairly persistent. She did have a few episodes where she felt cold chills with generalized weakness as well throughout the day but these were transient. She had a similar episode of numbness about a week ago where she felt like she had decreased strength in her right hand. That episode quickly resolved. She denies any fevers, chest pain, shortness of breath, nausea, vomiting, leg swelling or pain. She has a history of hypertension, prediabetes. She is on oral contraceptives. She does not smoke. She drinks occasional alcohol. She has a family history of stroke. No family history of demyelinating disease.     Past Medical History:  Diagnosis Date  . Anxiety   . Depression   . Diabetes mellitus without complication ([place])   . High blood pressure   . Hyperlipidemia   . Lower back pain   . Obesity (BMI 30-39.9)     Patient Active Problem List   Diagnosis Date Noted  . Hypertension 06/14/2019  . Reactive airway disease 08/28/2018  . Vitamin D deficiency 08/28/2018  . Class 1 obesity due to excess calories without serious comorbidity with body mass index (BMI) of 34.0 to 34.9 in adult 08/27/2018  . Attention deficit hyperactivity disorder (ADHD), combined type 08/27/2018  . Diverticulitis of sigmoid colon 07/22/2016    Past Surgical History:  Procedure  Laterality Date  . BLADDER SUSPENSION    . CESAREAN SECTION    . LUMBAR MICRODISCECTOMY  2018  . TONSILLECTOMY  2000     OB History    Gravida  3   Para      Term      Preterm      AB      Living        SAB      TAB      Ectopic      Multiple      Live Births              Family History  Problem Relation Age of Onset  . Hypertension Mother   . Hyperlipidemia Mother   . Obesity Mother   . COPD Father   . Cancer Father   . Heart disease Father   . Hypertension Father   . Stroke Father   . Cancer Maternal Grandfather   . Cancer Paternal Grandmother   . Colon cancer Neg Hx     Social History   Tobacco Use  . Smoking status: Former Smoker    Quit date: 08/27/2004    Years since quitting: 15.1  . Smokeless tobacco: Never Used  Substance Use Topics  . Alcohol use: Yes    Comment: Ocassional.  . Drug use: No    Home Medications Prior to Admission medications   Medication Sig Start Date End Date Taking? Authorizing Provider  ALPRAZolam (XANAX) 0.5 MG tablet TAKE 1 TABLET BY MOUTH  IN THE AM AND 1/2 TABLET IN THE AFTERNOON Patient taking differently: Take 0.5 mg by mouth daily as needed for anxiety.  05/27/19  Yes Briscoe Deutscher, DO  bimatoprost (LATISSE) 0.03 % ophthalmic solution PUT 1 DROP ON APPLICATOR &APPLY EVENLY ALONG SKIN OF UPPER LID AT BASE OF LASHES TO BOTH EYES AT BED Patient taking differently: Place 1 drop into both eyes See admin instructions. PLACE 1 DROP ONTO APPLICATOR & APPLY EVENLY ALONG SKIN OF UPPER LID AT BASE OF LASHES TO BOTH EYES AT BEDTIME 01/17/19  Yes Briscoe Deutscher, DO  metFORMIN (GLUCOPHAGE) 500 MG tablet Take 1 tablet (500 mg total) by mouth every morning. 10/18/19  Yes Beasley, Caren D, MD  naproxen (NAPROSYN) 500 MG tablet Take 1 tablet (500 mg total) by mouth 2 (two) times daily with a meal. Patient taking differently: Take 500 mg by mouth 2 (two) times daily as needed (for pain).  07/29/19  Yes Muthersbaugh, Jarrett Soho, PA-C    norethindrone (NORLYDA) 0.35 MG tablet Take 1 tablet by mouth daily.   Yes [provider]  valsartan-hydrochlorothiazide (DIOVAN-HCT) 80-12.5 MG tablet Take 1 tablet by mouth daily. 06/14/19  Yes Worley, Aldona Bar, PA  Vitamin D, Ergocalciferol, (DRISDOL) 1.25 MG (50000 UNIT) CAPS capsule Take 1 capsule (50,000 Units total) by mouth every 7 (seven) days. 10/18/19  Yes Dennard Nip D, MD  baclofen (LIORESAL) 10 MG tablet Take 1-2 tablets (10-20 mg total) by mouth 3 (three) times daily as needed for muscle spasms. Patient not taking: Reported on 10/19/2019 08/25/19   Gregor Hams, MD  gabapentin (NEURONTIN) 300 MG capsule Take 1 capsule (300 mg total) by mouth 3 (three) times daily as needed (nerve pain). Patient not taking: Reported on 10/19/2019 08/25/19   Gregor Hams, MD    Allergies    Amlodipine  Review of Systems   Review of Systems  Neurological: Positive for dizziness.  All other systems reviewed and are negative.   Physical Exam Updated Vital Signs BP (!) 153/84   Pulse (!) 103   Temp 98.5 F (36.9 C) (Oral)   Resp 18   LMP  (LMP Unknown) Comment: spotting with BC  SpO2 96%   Physical Exam Vitals and nursing note reviewed.  Constitutional:      Appearance: She is well-developed.  HENT:     Head: Normocephalic and atraumatic.  Cardiovascular:     Rate and Rhythm: Regular rhythm. Tachycardia present.     Heart sounds: No murmur.  Pulmonary:     Effort: Pulmonary effort is normal. No respiratory distress.     Breath sounds: Normal breath sounds.  Abdominal:     Palpations: Abdomen is soft.     Tenderness: There is no abdominal tenderness. There is no guarding or rebound.  Musculoskeletal:        General: No tenderness.     Comments: 2+ radial pulses bilaterally.  Skin:    General: Skin is warm and dry.  Neurological:     Mental Status: She is alert and oriented to person, place, and time.     Comments: 5/5 strength in all four extremities with sensation  to light touch intact in all four extremities.  No assymetry of facial movements.  EOMI.  Visual fields grossly intact.  No pronator drift.    Psychiatric:        Behavior: Behavior normal.     ED Results / Procedures / Treatments   Labs (all labs ordered are listed, but only abnormal results are displayed)  Labs Reviewed  COMPREHENSIVE METABOLIC PANEL - Abnormal; Notable for the following components:      Result Value   Glucose, Bld 107 (*)    All other components within normal limits  PROTIME-INR  APTT  CBC  DIFFERENTIAL  I-STAT BETA HCG BLOOD, ED ([facility], [facility], [facility] ONLY)    EKG EKG Interpretation  Date/Time:  Wednesday October 19 2019 19:01:07 EST Ventricular Rate:  116 PR Interval:  166 QRS Duration: 86 QT Interval:  320 QTC Calculation: 444 R Axis:   43 Text Interpretation: Sinus tachycardia Otherwise normal ECG no prior available for comparison Confirmed by Quintella Reichert [phone]) on 10/19/2019 8:07:03 PM   Radiology CT HEAD WO CONTRAST  Result Date: 10/19/2019 CLINICAL DATA:  Transient ischemic attack (TIA) Possible stroke tingling in right arm and leg. EXAM: CT HEAD WITHOUT CONTRAST TECHNIQUE: Contiguous axial images were obtained from the base of the skull through the vertex without intravenous contrast. COMPARISON:  None. FINDINGS: Brain: No intracranial hemorrhage, mass effect, or midline shift. No hydrocephalus. The basilar cisterns are patent. No evidence of territorial infarct or acute ischemia. No extra-axial or intracranial fluid collection. Vascular: No hyperdense vessel or unexpected calcification. Skull: Normal. Negative for fracture or focal lesion. Sinuses/Orbits: Paranasal sinuses and mastoid air cells are clear. The visualized orbits are unremarkable. Other: None. IMPRESSION: Negative noncontrast head CT. Electronically Signed   By: Keith Rake M.D.   On: 10/19/2019 19:46   MR BRAIN WO CONTRAST  Result Date: 10/19/2019 CLINICAL DATA:  Initial evaluation for  acute right-sided numbness. EXAM: MRI HEAD WITHOUT CONTRAST TECHNIQUE: Multiplanar, multiecho pulse sequences of the brain and surrounding structures were obtained without intravenous contrast. COMPARISON:  Prior head CT from earlier same day. FINDINGS: Brain: Cerebral volume within normal limits. Few punctate subcentimeter foci of T2/FLAIR hyperintensity noted within the subcortical/deep white matter of the anterior right frontal lobe, nonspecific, but felt to be of doubtful significance. No other focal parenchymal signal abnormality. No abnormal foci of restricted diffusion to suggest acute or subacute ischemia. Gray-white matter differentiation maintained. No encephalomalacia to suggest chronic cortical infarction. No evidence for acute or chronic intracranial hemorrhage. No mass lesion, midline shift or mass effect. No hydrocephalus or extra-axial fluid collection. Pituitary gland normal. Midline structures intact. Vascular: Major intracranial vascular flow voids are maintained. Skull and upper cervical spine: Craniocervical junction normal. Bone marrow signal intensity normal. No scalp soft tissue abnormality. Sinuses/Orbits: Globes and orbital soft tissues within normal limits. Paranasal sinuses are clear. No mastoid effusion. Inner ear structures normal. Other: None. IMPRESSION: Normal brain MRI.  No acute intracranial abnormality identified. Electronically Signed   By: Jeannine Boga M.D.   On: 10/19/2019 23:56    Procedures Procedures (including critical care time)  Medications Ordered in ED Medications  sodium chloride flush (NS) 0.9 % injection 3 mL (has no administration in time range)  LORazepam (ATIVAN) injection 1 mg (1 mg Intravenous Given 10/19/19 2157)    ED Course  I have reviewed the triage vital signs and the nursing notes.  Pertinent labs & imaging results that were available during my care of the patient were reviewed by me and considered in my medical decision making (see  chart for details).    MDM Rules/Calculators/A&P                     Patient here for evaluation of right arm and leg paresthesias since noon today. She is neurologically intact on evaluation. She does have mild tachycardia and she  states that this is her baseline. Discussed the case with neurologist on-call, Dr. Rory Percy, recommends MRI brain without contrast.   She was hypertensive on ED arrival. BP did improve during her ED stay with no significant change in her paresthesias. MRI is negative for acute abnormality. Current presentation is not consistent with acute CVA, PE, dissection. Discussed with patient unclear source of symptoms. Recommend neurology follow-up. Return precautions discussed. Final Clinical Impression(s) / ED Diagnoses Final diagnoses:  Numbness    Rx / DC Orders ED Discharge Orders         Ordered    Ambulatory referral to Neurology    Comments: An appointment is requested in approximately: 1 week   10/20/19 0006           Quintella Reichert, MD 10/20/19 0013

## 2019-10-19 NOTE — ED Triage Notes (Signed)
C/o tingling to R arm and leg since 12pm.  States 2 weeks ago her R hand was numb for approx 2 min and then resolved.  1 hr ago she felt like she was freezing all over, dizziness, hard to get her words out, and weakness.  States she had 2 episodes of those symptoms that only lasted a few minutes.  Currently only reports tingling to R arm and leg.  No arm drift.

## 2019-10-19 NOTE — ED Notes (Signed)
Pt transported to MRI 

## 2019-10-20 ENCOUNTER — Ambulatory Visit (INDEPENDENT_AMBULATORY_CARE_PROVIDER_SITE_OTHER): Payer: No Typology Code available for payment source | Admitting: Psychology

## 2019-10-20 DIAGNOSIS — F3289 Other specified depressive episodes: Secondary | ICD-10-CM

## 2019-10-20 DIAGNOSIS — F5089 Other specified eating disorder: Secondary | ICD-10-CM | POA: Diagnosis not present

## 2019-10-20 NOTE — ED Notes (Signed)
Patient verbalizes understanding of discharge instructions and follow up care. Opportunity for questioning and answers were provided. All questions answered completely. PIV removed, catheter intact. Site dressed with gauze and tape. Armband removed by staff, pt discharged from ED. Ambulatory with strong, steady gait

## 2019-10-20 NOTE — Progress Notes (Unsigned)
Office: [phone]  /  Fax: [phone]    Date: November 03, 2019   Appointment Start Time: *** Duration: *** minutes Provider: Glennie Isle, Psy.D. Type of Session: Individual Therapy  Location of Patient: {gbptloc:23249} Location of Provider: {Location of Service:22491} Type of Contact: Telepsychological Visit via {gbtelepsych:23399}  Session Content: [name] is a 44 y.o. female presenting via {gbtelepsych:23399} for a follow-up appointment to address the previously established treatment goal of decreasing emotional eating. Today's appointment was a telepsychological visit due to COVID-19. Euline provided verbal consent for today's telepsychological appointment and she is aware she is responsible for securing confidentiality on her end of the session. Prior to proceeding with today's appointment, [name]'s physical location at the time of this appointment was obtained as well a phone number she could be reached at in the event of technical difficulties. Adiya and this provider participated in today's telepsychological service.   This provider conducted a brief check-in and verbally administered the PHQ-9 and GAD-7. ***Psychoeducation regarding mindfulness was provided. A handout was provided to [name] with further information regarding mindfulness, including exercises. This provider also explained the benefit of mindfulness as it relates to emotional eating. Belva was encouraged to engage in the provided exercises between now and the next appointment with this provider. [name] agreed. During today's appointment, [name] was led through a mindfulness exercise involving her senses. [name] provided verbal consent during today's appointment for this provider to send a handout about mindfulness via e-mail.  [name] was receptive to today's appointment as evidenced by openness to sharing, responsiveness to feedback, and {gbreceptiveness:23401}.  Mental Status Examination:    Appearance: {Appearance:22431} Behavior: {Behavior:22445} Mood: {gbmood:21757} Affect: {Affect:22436} Speech: {Speech:22432} Eye Contact: {Eye Contact:22433} Psychomotor Activity: {Motor Activity:22434} Gait: {gbgait:23404} Thought Process: {thought process:22448}  Thought Content/Perception: {disturbances:22451} Orientation: {Orientation:22437} Memory/Concentration: {gbcognition:22449} Insight/Judgment: {Insight:22446}  Structured Assessments Results: The Patient Health Questionnaire-9 (PHQ-9) is a self-report measure that assesses symptoms and severity of depression over the course of the last two weeks. Alletta obtained a score of *** suggesting {GBPHQ9SEVERITY:21752}. [name] finds the endorsed symptoms to be {gbphq9difficulty:21754}. [0= Not at all; 1= Several days; 2= More than half the days; 3= Nearly every day] Little interest or pleasure in doing things ***  Feeling down, depressed, or hopeless ***  Trouble falling or staying asleep, or sleeping too much ***  Feeling tired or having little energy ***  Poor appetite or overeating ***  Feeling bad about yourself --- or that you are a failure or have let yourself or your family down ***  Trouble concentrating on things, such as reading the newspaper or watching television ***  Moving or speaking so slowly that other people could have noticed? Or the opposite --- being so fidgety or restless that you have been moving around a lot more than usual ***  Thoughts that you would be better off dead or hurting yourself in some way ***  PHQ-9 Score ***    The Generalized Anxiety Disorder-7 (GAD-7) is a brief self-report measure that assesses symptoms of anxiety over the course of the last two weeks. [name] obtained a score of *** suggesting {gbgad7severity:21753}. Lylli finds the endorsed symptoms to be {gbphq9difficulty:21754}. [0= Not at all; 1= Several days; 2= Over half the days; 3= Nearly every day] Feeling nervous, anxious,  on edge ***  Not being able to stop or control worrying ***  Worrying too much about different things ***  Trouble relaxing ***  Being so restless that it's hard to sit still ***  Becoming easily annoyed  or irritable ***  Feeling afraid as if something awful might happen ***  GAD-7 Score ***   Interventions:  {Interventions for Progress Notes:23405}  DSM-5 Diagnosis(es): 311 (F32.8) Other Specified Depressive Disorder, Emotional Eating Behaviors  Treatment Goal & Progress: During the initial appointment with this provider, the following treatment goal was established: decrease emotional eating. [name] has demonstrated progress in her goal as evidenced by {gbtxprogress:22839}. Anegla also {gbtxprogress2:22951}.  Plan: The next appointment will be scheduled in {gbweeks:21758}, which will be {gbtxmodality:23402}. The next session will focus on {Plan for Next Appointment:23400}.

## 2019-10-22 ENCOUNTER — Other Ambulatory Visit: Payer: No Typology Code available for payment source

## 2019-10-25 ENCOUNTER — Encounter (INDEPENDENT_AMBULATORY_CARE_PROVIDER_SITE_OTHER): Payer: Self-pay | Admitting: Family Medicine

## 2019-10-25 NOTE — Telephone Encounter (Signed)
Please advise 

## 2019-10-26 ENCOUNTER — Other Ambulatory Visit: Payer: Self-pay

## 2019-10-26 ENCOUNTER — Ambulatory Visit (INDEPENDENT_AMBULATORY_CARE_PROVIDER_SITE_OTHER): Payer: No Typology Code available for payment source | Admitting: Physician Assistant

## 2019-10-26 ENCOUNTER — Encounter: Payer: Self-pay | Admitting: Physician Assistant

## 2019-10-26 VITALS — BP 120/70 | HR 89 | Temp 98.0°F | Ht 66.0 in | Wt 231.0 lb

## 2019-10-26 DIAGNOSIS — R202 Paresthesia of skin: Secondary | ICD-10-CM

## 2019-10-26 NOTE — Progress Notes (Signed)
Crystal Madden is a 44 y.o. female is here to follow up from the ED.  I acted as a Education administrator for [employer], PA-C Anselmo Pickler, LPN  History of Present Illness:   Chief Complaint  Patient presents with  . Tingling and Numbness    HPI   Tingling & Numbness Pt was seen in the ED on 03/10 for numbness and tingling right arm and leg. Pt had episode about 2-3 weeks before where she felt coldness throughout her whole body and then had weakness in right hand, tingling in arm and unable to grasp object to open. She had labs completed which were normal and then had normal CT and MRI.  Overall, symptoms have resolved but she is concerned that it may happen again and she would like to know why it happened.  Does have MR lumbar of spine wo contrast  Does have new patient appointment with neurology in July.  There are no preventive care reminders to display for this patient.  Past Medical History:  Diagnosis Date  . Anxiety   . Depression   . Diabetes mellitus without complication ([place])   . High blood pressure   . Hyperlipidemia   . Lower back pain   . Obesity (BMI 30-39.9)      Social History   Socioeconomic History  . Marital status: Married    Spouse name: ryan  . Number of children: Not on file  . Years of education: Not on file  . Highest education level: Not on file  Occupational History  . Occupation: Quality Improvement    Employer: [employer]  Tobacco Use  . Smoking status: Former Smoker    Quit date: 08/27/2004    Years since quitting: 15.1  . Smokeless tobacco: Never Used  Substance and Sexual Activity  . Alcohol use: Yes    Comment: Ocassional.  . Drug use: No  . Sexual activity: Not on file  Other Topics Concern  . Not on file  Social History Narrative  . Not on file   Social Determinants of Health   Financial Resource Strain:   . Difficulty of Paying Living Expenses:   Food Insecurity:   . Worried About Charity fundraiser in the Last  Year:   . Arboriculturist in the Last Year:   Transportation Needs:   . Film/video editor (Medical):   Marland Kitchen Lack of Transportation (Non-Medical):   Physical Activity:   . Days of Exercise per Week:   . Minutes of Exercise per Session:   Stress:   . Feeling of Stress :   Social Connections:   . Frequency of Communication with Friends and Family:   . Frequency of Social Gatherings with Friends and Family:   . Attends Religious Services:   . Active Member of Clubs or Organizations:   . Attends Archivist Meetings:   Marland Kitchen Marital Status:   Intimate Partner Violence:   . Fear of Current or Ex-Partner:   . Emotionally Abused:   Marland Kitchen Physically Abused:   . Sexually Abused:     Past Surgical History:  Procedure Laterality Date  . BLADDER SUSPENSION    . CESAREAN SECTION    . LUMBAR MICRODISCECTOMY  2018  . TONSILLECTOMY  2000    Family History  Problem Relation Age of Onset  . Hypertension Mother   . Hyperlipidemia Mother   . Obesity Mother   . COPD Father   . Cancer Father   . Heart  disease Father   . Hypertension Father   . Stroke Father   . Cancer Maternal Grandfather   . Cancer Paternal Grandmother   . Colon cancer Neg Hx     PMHx, SurgHx, SocialHx, FamHx, Medications, and Allergies were reviewed in the Visit Navigator and updated as appropriate.   Patient Active Problem List   Diagnosis Date Noted  . Hypertension 06/14/2019  . Vitamin D deficiency 08/28/2018  . Class 1 obesity due to excess calories without serious comorbidity with body mass index (BMI) of 34.0 to 34.9 in adult 08/27/2018  . Attention deficit hyperactivity disorder (ADHD), combined type 08/27/2018  . Diverticulitis of sigmoid colon 07/22/2016    Social History   Tobacco Use  . Smoking status: Former Smoker    Quit date: 08/27/2004    Years since quitting: 15.1  . Smokeless tobacco: Never Used  Substance Use Topics  . Alcohol use: Yes    Comment: Ocassional.  . Drug use: No     Current Medications and Allergies:    Current Outpatient Medications:  .  ALPRAZolam (XANAX) 0.5 MG tablet, TAKE 1 TABLET BY MOUTH IN THE AM AND 1/2 TABLET IN THE AFTERNOON (Patient taking differently: Take 0.5 mg by mouth daily as needed for anxiety. ), Disp: 30 tablet, Rfl: 2 .  bimatoprost (LATISSE) 0.03 % ophthalmic solution, PUT 1 DROP ON APPLICATOR &APPLY EVENLY ALONG SKIN OF UPPER LID AT BASE OF LASHES TO BOTH EYES AT BED (Patient taking differently: Place 1 drop into both eyes See admin instructions. PLACE 1 DROP ONTO APPLICATOR & APPLY EVENLY ALONG SKIN OF UPPER LID AT BASE OF LASHES TO BOTH EYES AT BEDTIME), Disp: 3 mL, Rfl: 12 .  metFORMIN (GLUCOPHAGE) 500 MG tablet, Take 1 tablet (500 mg total) by mouth every morning., Disp: 30 tablet, Rfl: 0 .  naproxen (NAPROSYN) 500 MG tablet, Take 1 tablet (500 mg total) by mouth 2 (two) times daily with a meal. (Patient taking differently: Take 500 mg by mouth 2 (two) times daily as needed (for pain). ), Disp: 30 tablet, Rfl: 0 .  norethindrone (NORLYDA) 0.35 MG tablet, Take 1 tablet by mouth daily., Disp: , Rfl:  .  valsartan-hydrochlorothiazide (DIOVAN-HCT) 80-12.5 MG tablet, Take 1 tablet by mouth daily., Disp: 90 tablet, Rfl: 1 .  Vitamin D, Ergocalciferol, (DRISDOL) 1.25 MG (50000 UNIT) CAPS capsule, Take 1 capsule (50,000 Units total) by mouth every 7 (seven) days., Disp: 4 capsule, Rfl: 0 .  baclofen (LIORESAL) 10 MG tablet, Take 1-2 tablets (10-20 mg total) by mouth 3 (three) times daily as needed for muscle spasms. (Patient not taking: Reported on 10/19/2019), Disp: 90 each, Rfl: 1 .  gabapentin (NEURONTIN) 300 MG capsule, Take 1 capsule (300 mg total) by mouth 3 (three) times daily as needed (nerve pain). (Patient not taking: Reported on 10/19/2019), Disp: 90 capsule, Rfl: 1   Allergies  Allergen Reactions  . Amlodipine Swelling    Lower leg swelling when increased to 10 mg    Review of Systems   ROS  Negative unless otherwise  specified per HPI.  Vitals:   Vitals:   10/26/19 1459  BP: 120/70  Pulse: 89  Temp: 98 F (36.7 C)  TempSrc: Temporal  SpO2: 98%  Weight: 231 lb (104.8 kg)  Height: 5\' 6"  (1.676 m)     Body mass index is 37.28 kg/m.   Physical Exam:    Physical Exam Vitals and nursing note reviewed.  Constitutional:      General: She is  not in acute distress.    Appearance: She is well-developed. She is not ill-appearing or toxic-appearing.  Cardiovascular:     Rate and Rhythm: Normal rate and regular rhythm.     Pulses: Normal pulses.     Heart sounds: Normal heart sounds, S1 normal and S2 normal.     Comments: No LE edema Pulmonary:     Effort: Pulmonary effort is normal.     Breath sounds: Normal breath sounds.  Skin:    General: Skin is warm and dry.  Neurological:     General: No focal deficit present.     Mental Status: She is alert.     GCS: GCS eye subscore is 4. GCS verbal subscore is 5. GCS motor subscore is 6.     Cranial Nerves: Cranial nerves are intact.     Sensory: Sensation is intact.     Motor: Motor function is intact.     Coordination: Coordination is intact.     Gait: Gait is intact.     Deep Tendon Reflexes:     Reflex Scores:      Patellar reflexes are 2+ on the right side and 2+ on the left side. Psychiatric:        Speech: Speech normal.        Behavior: Behavior normal. Behavior is cooperative.      Assessment and Plan:    Karyna was seen today for tingling and numbness.  Diagnoses and all orders for this visit:  Paresthesias   Unclear etiology of symptoms. No red flags on discussion or evaluation today, and she is asymptomatic.   I will reach out to her sports medicine provider in order to see if he recommends further evaluation prior to neuro appointment in summer.  She understands worsening precautions and when to proceed to ER.  Reviewed expectations re: course of current medical issues. Discussed self-management of  symptoms. Outlined signs and symptoms indicating need for more acute intervention. Patient verbalized understanding and all questions were answered. See orders for this visit as documented in the electronic medical record. Patient received an After Visit Summary.  CMA or LPN served as scribe during this visit. History, Physical, and Plan performed by medical provider. The above documentation has been reviewed and is accurate and complete.  Inda Coke, PA-C [place] 10/26/2019  Follow-up: No follow-ups on file.

## 2019-10-28 ENCOUNTER — Encounter: Payer: Self-pay | Admitting: Physician Assistant

## 2019-10-28 ENCOUNTER — Other Ambulatory Visit: Payer: Self-pay | Admitting: Physician Assistant

## 2019-10-28 DIAGNOSIS — R202 Paresthesia of skin: Secondary | ICD-10-CM

## 2019-10-29 ENCOUNTER — Other Ambulatory Visit: Payer: Self-pay

## 2019-10-29 ENCOUNTER — Ambulatory Visit (INDEPENDENT_AMBULATORY_CARE_PROVIDER_SITE_OTHER): Payer: No Typology Code available for payment source

## 2019-10-29 DIAGNOSIS — R202 Paresthesia of skin: Secondary | ICD-10-CM | POA: Diagnosis not present

## 2019-10-29 DIAGNOSIS — M545 Low back pain, unspecified: Secondary | ICD-10-CM

## 2019-10-29 IMAGING — MR MR LUMBAR SPINE W/O CM
4 of 5 series · 24 of 48 positions shown · non-contrast
Comparison: Previous MRI from [DATE]

CLINICAL DATA: Initial evaluation for low back pain with bilateral
groin pain, right leg weakness for 1 year. History of prior
discectomy.

EXAM:
MRI LUMBAR SPINE WITHOUT CONTRAST
TECHNIQUE: Multiplanar, multisequence MR imaging of the lumbar spine was
performed. No intravenous contrast was administered.

[Series 2: T2 · sagittal · 4.0mm · 0.81mm/px · 6 of 15 slices shown (1 of 2)]
[im 1/15]
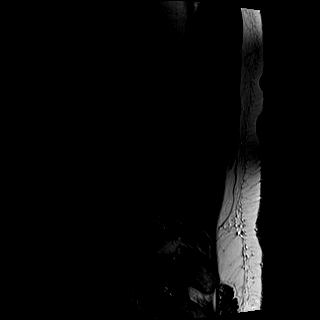
[im 3/15]
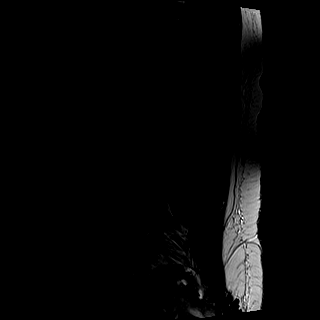
[im 6/15]
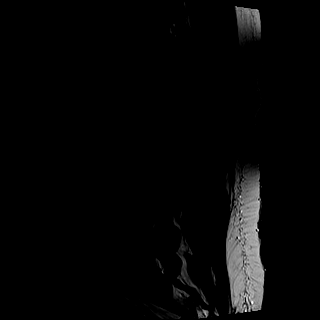
[im 9/15]
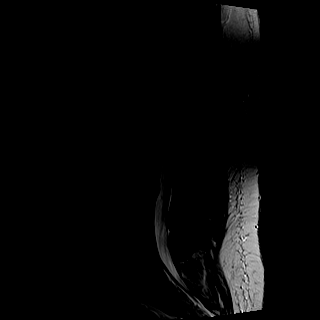
[im 12/15]
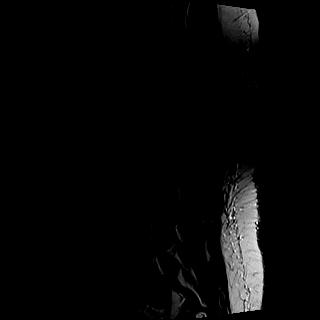
[im 15/15]
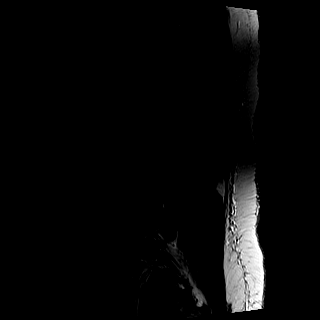

[Series 3: T1 · sagittal · 4.0mm · 0.41mm/px · 6 of 15 slices shown (1 of 2)]
[im 1/15]
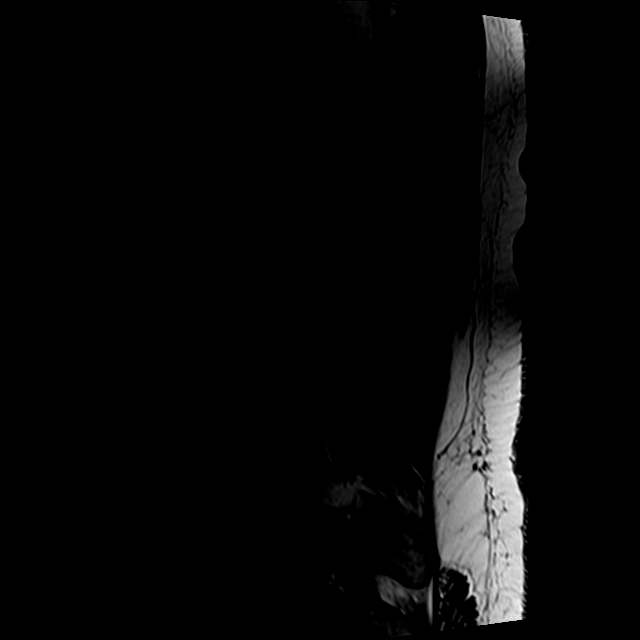
[im 3/15]
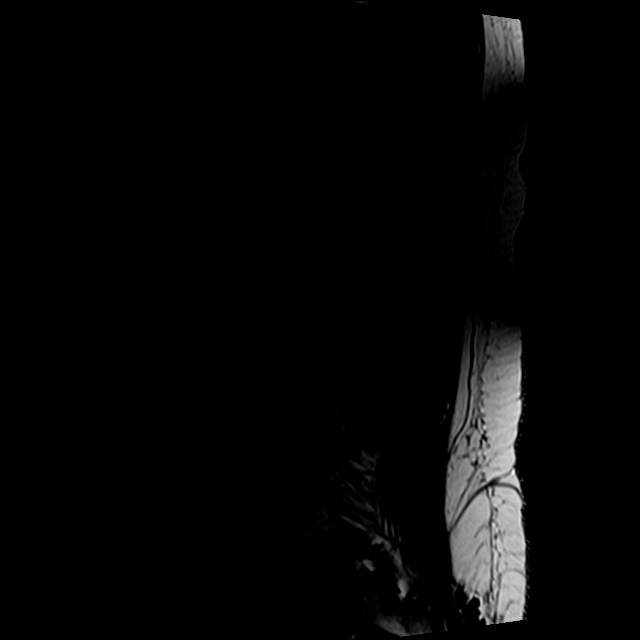
[im 6/15]
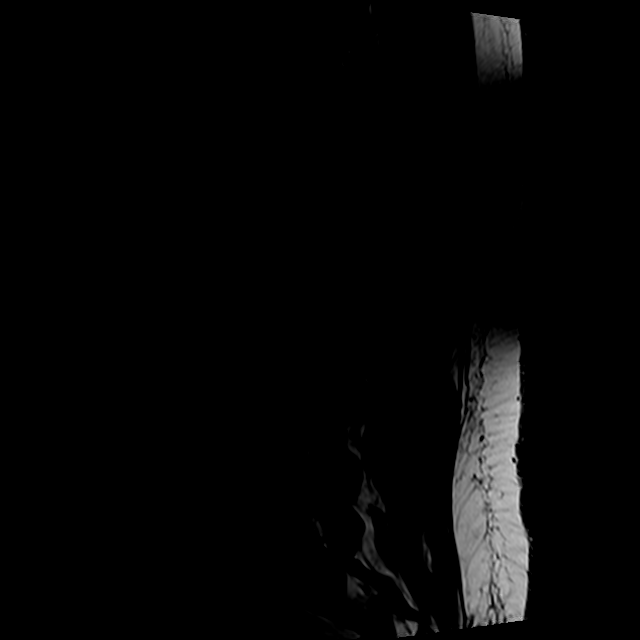
[im 9/15]
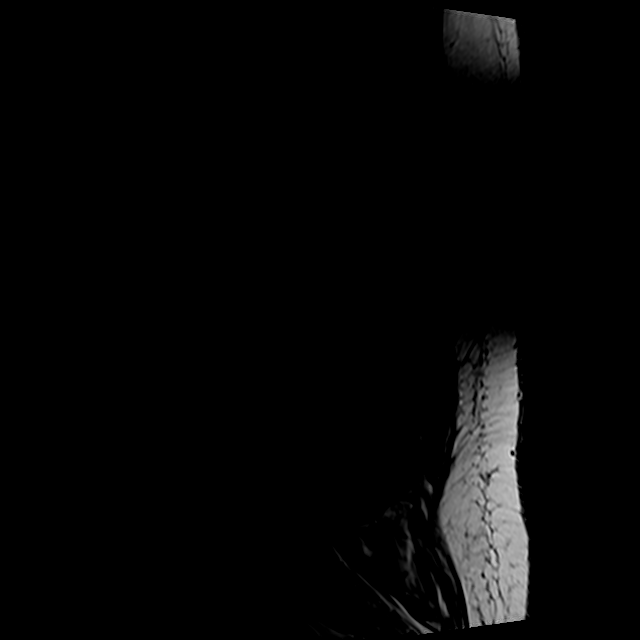
[im 12/15]
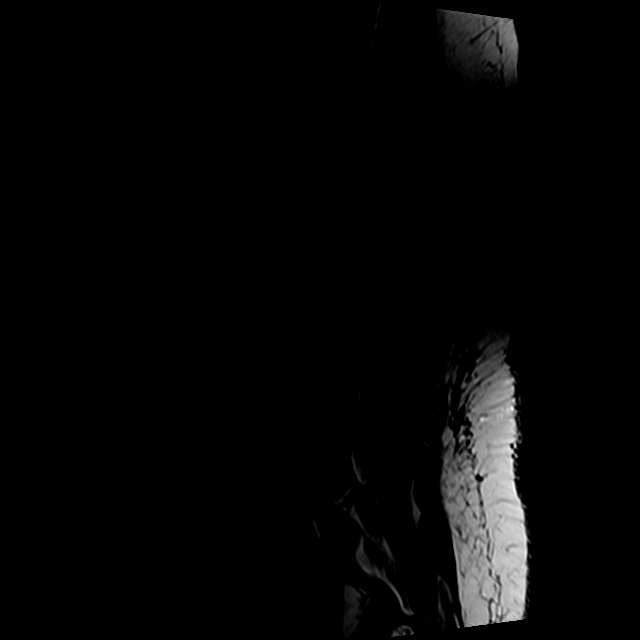
[im 15/15]
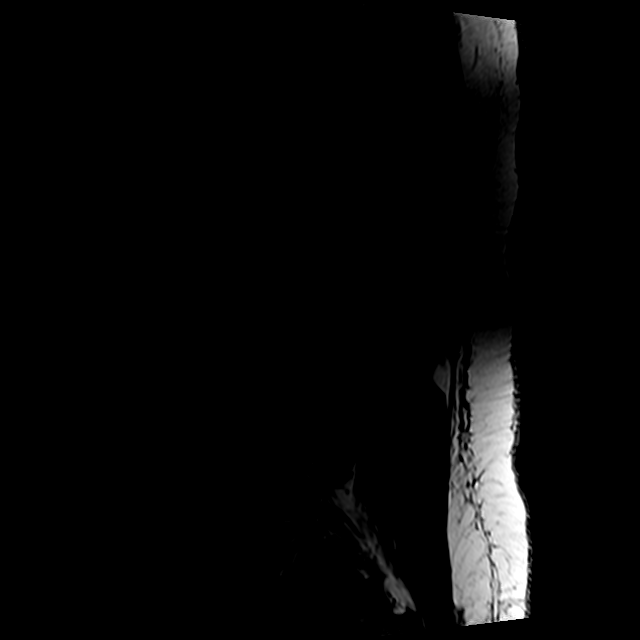

[Series 5: T2 · axial · 4.0mm · 0.78mm/px · z∈[-534,-322]mm · 9 of 39 slices shown (2 of 2)]
[im 1/39]
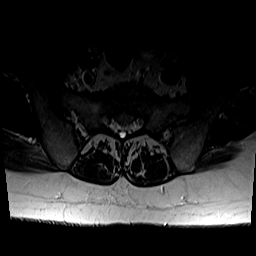
[im 6/39]
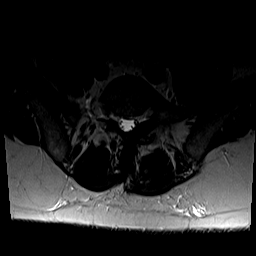
[im 11/39]
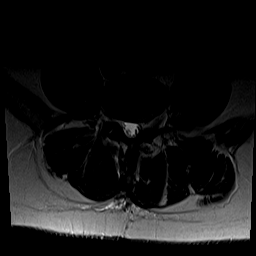
[im 17/39]
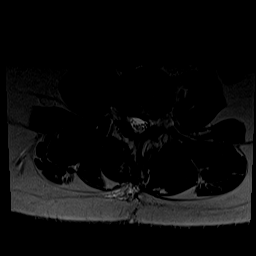
[im 20/39]
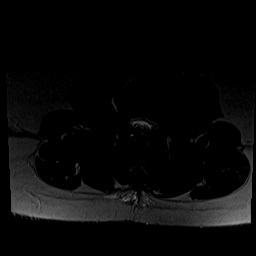
[im 22/39]
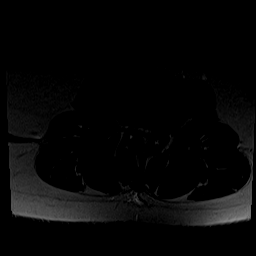
[im 28/39]
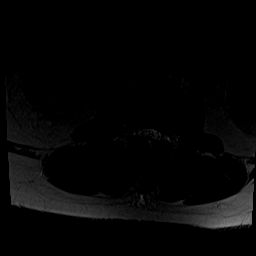
[im 33/39]
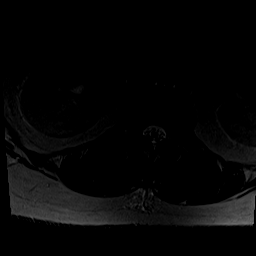
[im 39/39]
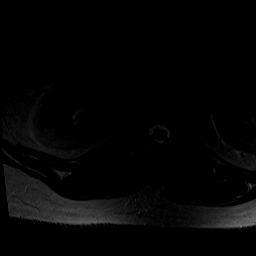

[Series 6: T1 · axial · 4.0mm · 0.39mm/px · z∈[-510,-352]mm · 3 of 39 slices shown (2 of 2)]
[im 6/39]
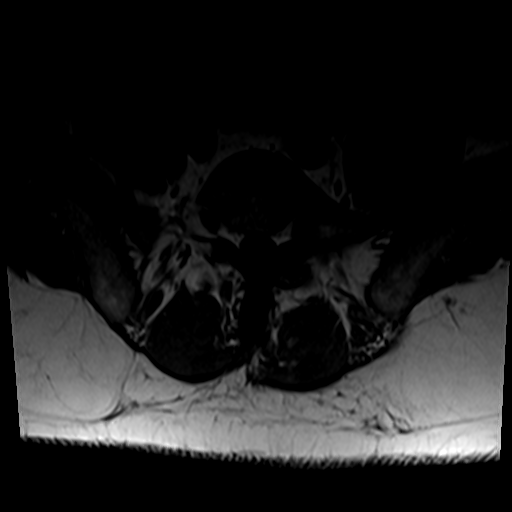
[im 20/39]
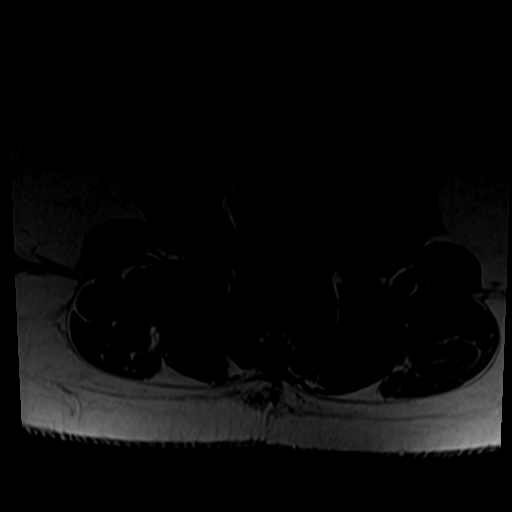
[im 33/39]
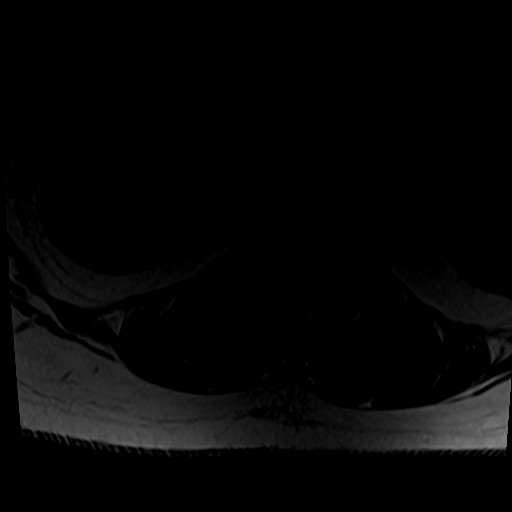

[24 of 48 positions shown; findings below may reference images not displayed]

FINDINGS: Segmentation: Standard. Lowest well-formed disc space labeled the
L5-S1 level.

Alignment: Mild straightening and reversal of the normal lumbar
lordosis, stable. No listhesis or subluxation.

Vertebrae: Vertebral body height maintained without evidence for
acute or chronic fracture. Bone marrow signal intensity mildly
heterogeneous but within normal limits. Discogenic reactive endplate
changes noted about the L2-3 interspace anteriorly. No other
abnormal marrow edema.

Conus medullaris and cauda equina: Conus extends to the L1 level.
Conus and cauda equina appear normal.

Paraspinal and other soft tissues: Chronic postoperative changes
noted within the posterior paraspinous soft tissues. Paraspinous
tissues demonstrate no acute finding. Visualized visceral structures
within normal limits.

Disc levels:

T11-12: Unremarkable

T12-L1: Normal interspace. Mild facet hypertrophy. No stenosis or
impingement.

L1-2: Chronic intervertebral disc space narrowing with diffuse disc
bulge and disc desiccation. Mild reactive endplate changes with
marginal endplate osteophytic spurring. Superimposed mild facet
hypertrophy. No significant canal or lateral recess stenosis.
Foramina remain patent.

L2-3: Chronic intervertebral disc space narrowing with diffuse disc
bulge and disc desiccation. Disc bulging slightly asymmetric left.
Superimposed reactive endplate changes with marginal endplate
osteophytic spurring. Resultant moderate canal with moderate left
greater than right lateral recess stenosis. Moderate bilateral L2
foraminal narrowing, also greater on the left. Appearance is similar
to previous.

L3-4: Mild diffuse disc bulge with disc desiccation. Superimposed
mild reactive endplate changes. Postoperative changes from prior
left hemi laminectomy and micro discectomy. Disc bulging slightly
asymmetric to the left with superimposed left foraminal disc
protrusion, contacting the exiting left L3 nerve (series 3, image
11). Superimposed mild facet hypertrophy. Trace bilateral joint
effusions noted. Mild narrowing of the left lateral recess, similar
to previous. Central canal remains patent. Mild to moderate
bilateral L3 foraminal narrowing, greater on the left. Overall,
appearance is similar to previous.

L4-5: Disc desiccation with mild disc bulge. Postoperative changes
from prior left hemi laminectomy and micro discectomy. Small
residual left subarticular disc protrusion with slight inferior
angulation (series 2, image 9). Mild bilateral facet hypertrophy.
Persistent moderate left lateral recess narrowing. Central canal
remains patent. No significant foraminal stenosis.

L5-S1: Normal interspace. Mild bilateral facet hypertrophy. No
stenosis or impingement.
IMPRESSION: 1. Multifactorial degenerative changes at L2-3 with resultant
moderate canal with left greater than right lateral recess stenosis,
similar to previous.
2. Degenerative disc bulge with superimposed left foraminal disc
protrusion at L3-4, contacting the exiting left L3 nerve.
3. Postoperative changes from prior left hemi laminectomy and micro
discectomy at L3-4 and L4-5 with small residual left subarticular
disc protrusion at L4-5. Residual mild to moderate left lateral
recess narrowing at these levels is relatively unchanged.

## 2019-10-29 IMAGING — MR MR CERVICAL SPINE W/O CM
3 series · 34 of 48 positions shown · non-contrast
Comparison: No pertinent prior studies available for comparison.

CLINICAL DATA: Paresthesias. Trunk numbness or tingling. Additional
history provided: Paresthesias, right-sided numbness/tingling/loss
of strength right hand for 1 month.

EXAM:
MRI CERVICAL SPINE WITHOUT CONTRAST
TECHNIQUE: Multiplanar, multisequence MR imaging of the cervical spine was
performed. No intravenous contrast was administered.

[Series 3: T2 · sagittal · 3.0mm · 0.69mm/px · 11 of 13 slices shown]
[im 1/13]
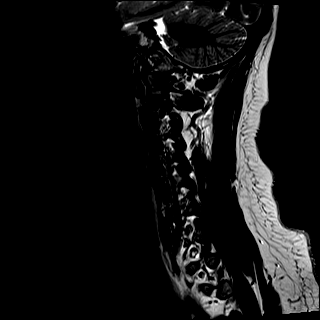
[im 2/13]
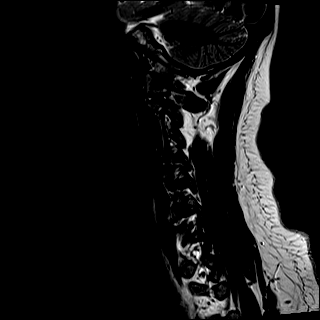
[im 3/13]
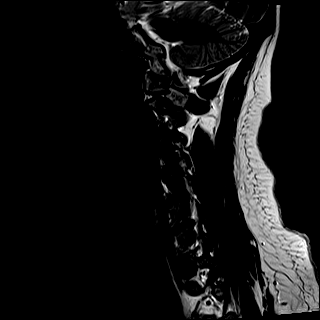
[im 4/13]
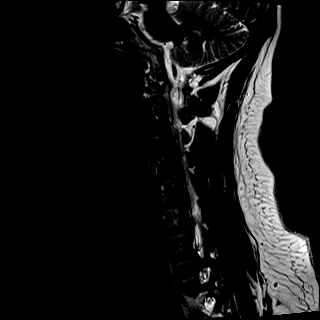
[im 5/13]
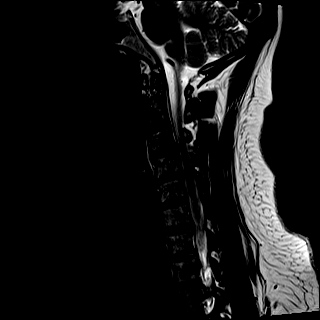
[im 7/13]
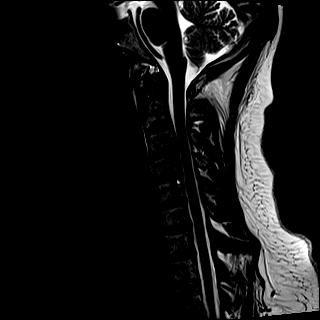
[im 8/13]
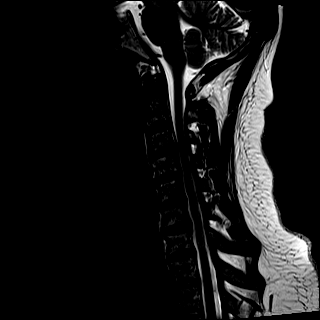
[im 9/13]
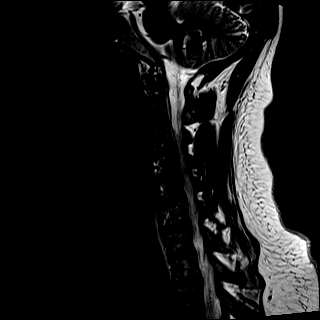
[im 10/13]
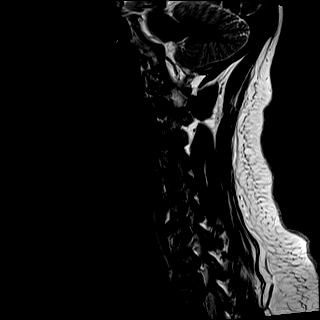
[im 11/13]
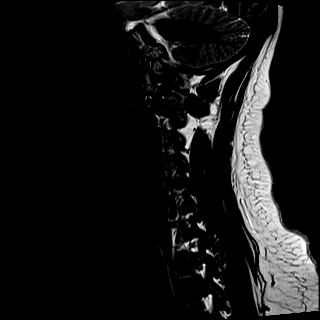
[im 13/13]
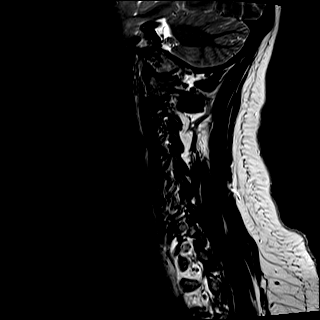

[Series 5: STIR · sagittal · 3.0mm · 0.69mm/px · 11 of 13 slices shown]
[im 1/13]
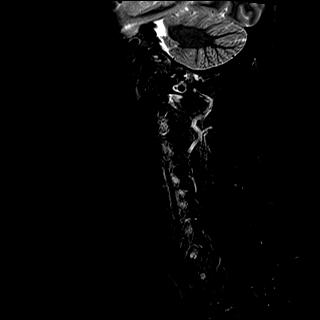
[im 2/13]
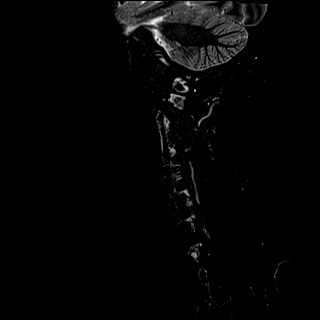
[im 3/13]
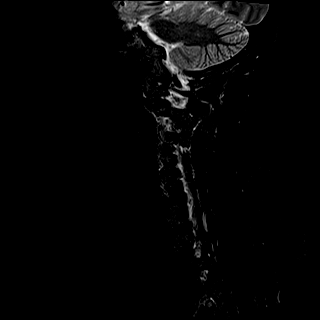
[im 4/13]
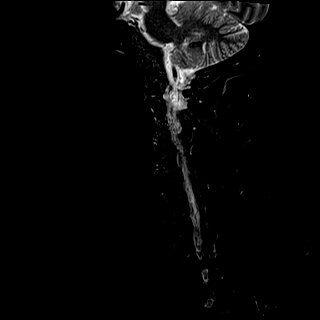
[im 5/13]
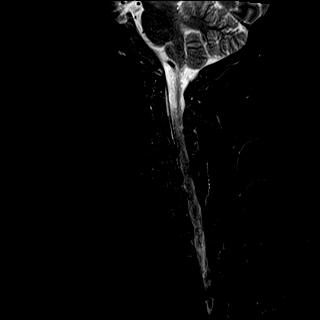
[im 7/13]
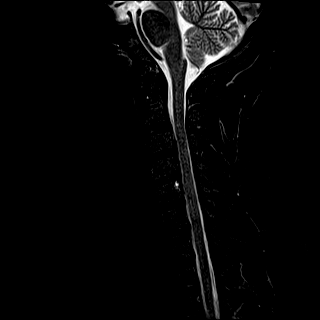
[im 8/13]
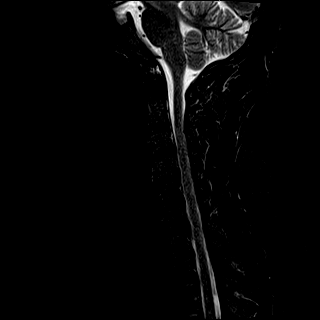
[im 9/13]
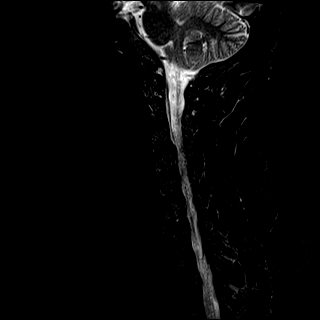
[im 10/13]
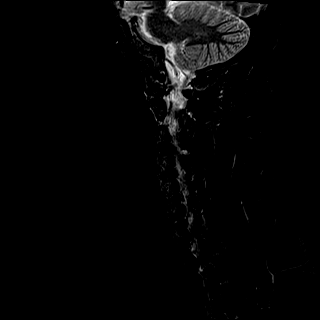
[im 11/13]
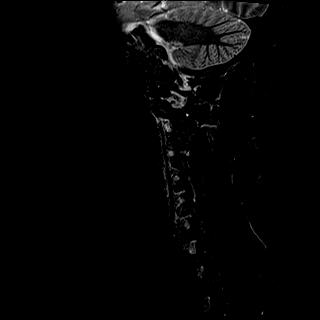
[im 13/13]
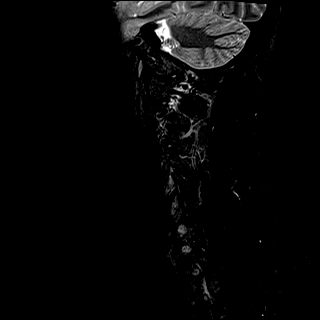

[Series 7: mpgr ax · axial · 3.0mm · 0.35mm/px · z∈[-51,+51]mm · 12 of 30 slices shown]
[im 1/30]
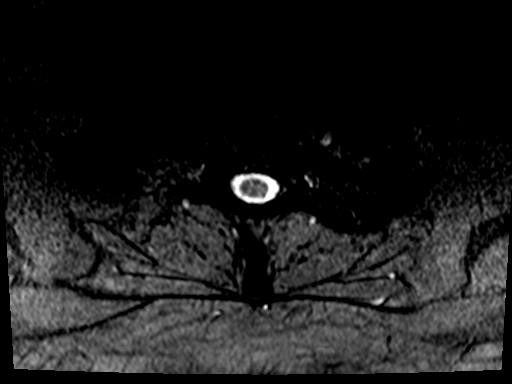
[im 2/30]
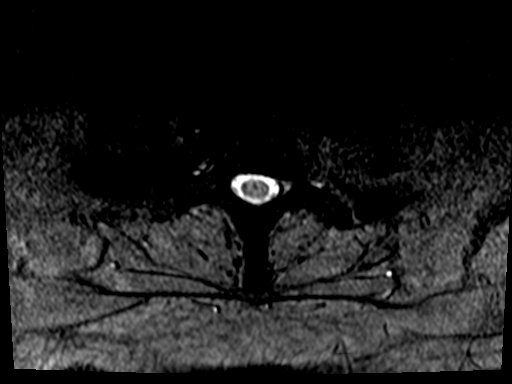
[im 3/30]
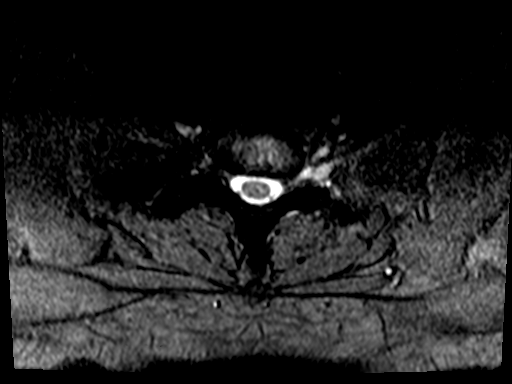
[im 4/30]
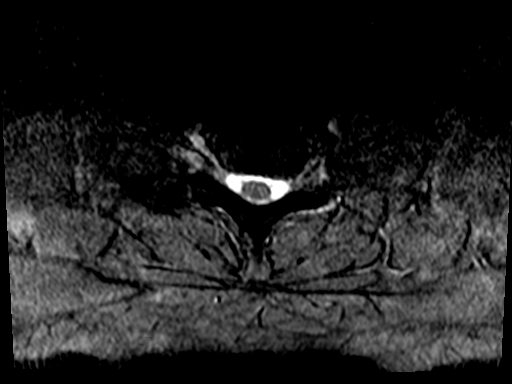
[im 5/30]
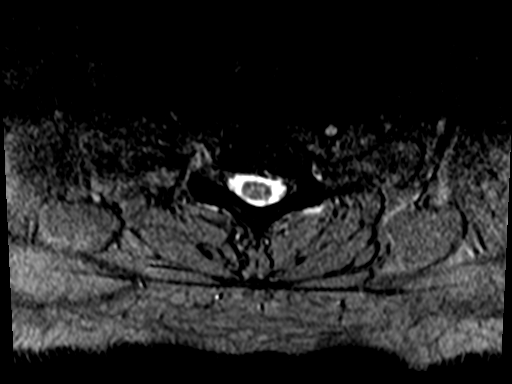
[im 10/30]
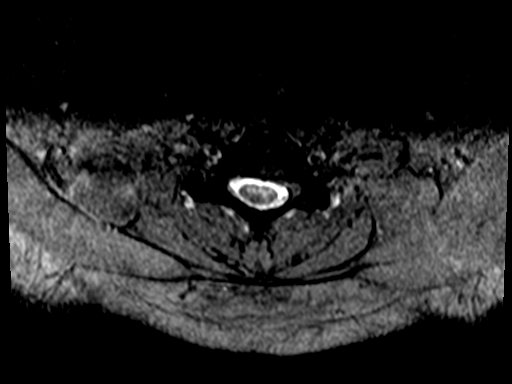
[im 13/30]
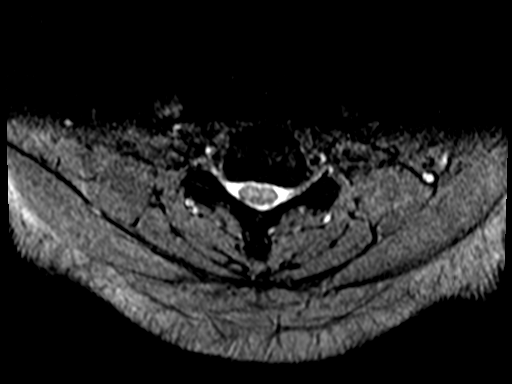
[im 16/30]
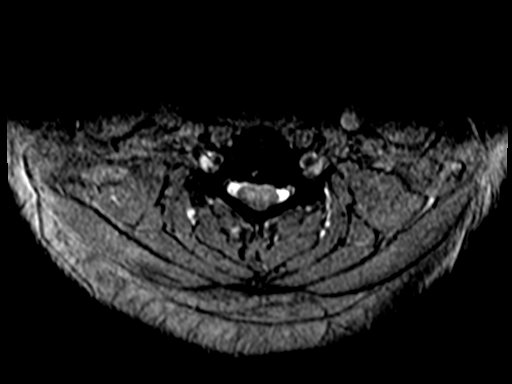
[im 17/30]
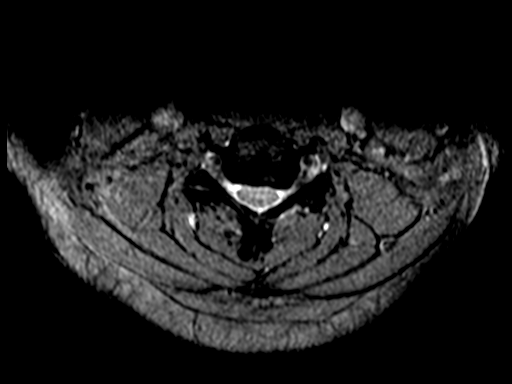
[im 20/30]
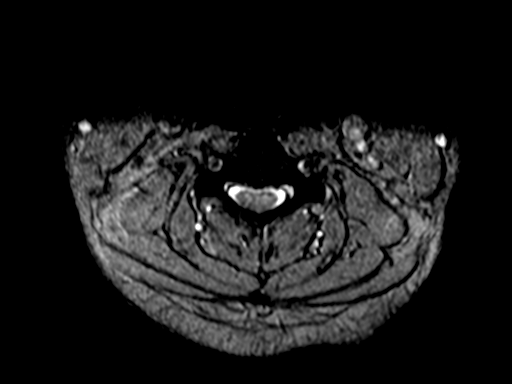
[im 25/30]
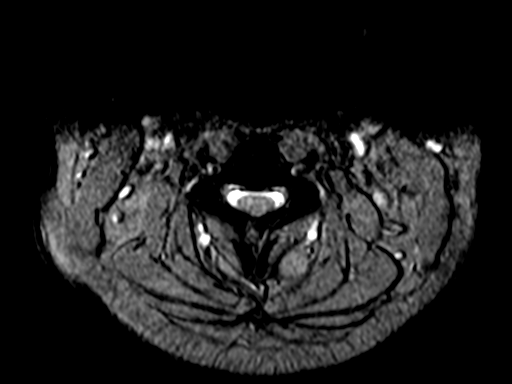
[im 28/30]
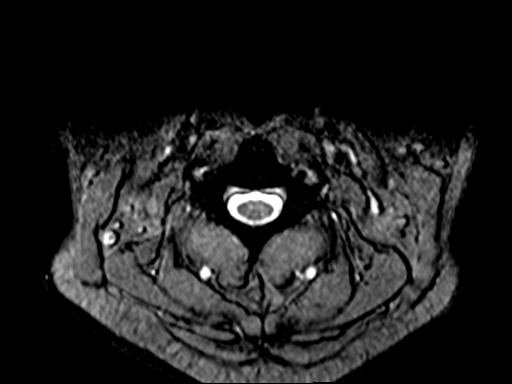

[34 of 48 positions shown; findings below may reference images not displayed]

FINDINGS: Alignment: Straightening of the expected cervical lordosis. No
significant spondylolisthesis

Vertebrae: Vertebral body height is maintained. No marrow edema or
suspicious osseous lesion. Tiny benign appearing cystic lesion
versus annular fissure along the posterosuperior aspect of the C5
vertebral body.

Cord: No spinal cord signal abnormality.

Posterior Fossa, vertebral arteries, paraspinal tissues: No
abnormality identified within included portions of the posterior
fossa. Flow voids preserved within the cervical vertebral arteries.
Paraspinal soft tissues within normal limits.

Disc levels:

Mild-to-moderate disc degeneration throughout the cervical spine.

Congenitally narrow cervical spinal canal.

C2-C3: No disc herniation. No significant canal or foraminal
stenosis.

C3-C4: Minimal disc bulge right-sided disc osteophyte ridge/uncinate
hypertrophy contributes to moderate/severe right neural foraminal
narrowing. Left-sided uncinate hypertrophy contributes to mild left
neural foraminal narrowing. Mild degenerative spinal canal stenosis.

C4-C5: Minimal disc bulge. Bilateral disc osteophyte ridge/uncinate
hypertrophy. Mild degenerative spinal canal narrowing. Mild
bilateral neural foraminal narrowing (greater on the left).

C5-C6: No significant disc herniation or spinal canal stenosis.
Right greater than left uncinate hypertrophy. Mild right neural
foraminal narrowing.

C6-C7: No disc herniation. No significant canal or foraminal
stenosis.

C7-T1: No disc herniation. No significant canal or foraminal
stenosis.
IMPRESSION: Cervical spondylosis, as outlined and superimposed upon a
congenitally narrow cervical spinal canal.

No more than mild degenerative spinal canal narrowing at any level.

Multilevel neural foraminal narrowing as described and greatest on
the right at C3-C4 where right-sided disc osteophyte ridge
contributes to moderate/severe foraminal stenosis. Correlate for
right C4 radiculopathy.

## 2019-10-31 NOTE — Progress Notes (Signed)
MRI L-spine does show some bulging disks and some arthritis.  Please return to clinic for recheck to discuss the MRI lumbar spine in further detail.  Could also discuss the cervical spine that Len Blalock ordered.

## 2019-11-02 ENCOUNTER — Ambulatory Visit (INDEPENDENT_AMBULATORY_CARE_PROVIDER_SITE_OTHER): Payer: No Typology Code available for payment source | Admitting: Family Medicine

## 2019-11-02 ENCOUNTER — Encounter: Payer: Self-pay | Admitting: Family Medicine

## 2019-11-02 ENCOUNTER — Other Ambulatory Visit: Payer: Self-pay

## 2019-11-02 ENCOUNTER — Encounter (INDEPENDENT_AMBULATORY_CARE_PROVIDER_SITE_OTHER): Payer: Self-pay | Admitting: Family Medicine

## 2019-11-02 VITALS — BP 113/70 | HR 92 | Temp 98.1°F | Ht 66.0 in | Wt 222.0 lb

## 2019-11-02 VITALS — BP 110/74 | HR 96 | Ht 66.0 in | Wt 227.4 lb

## 2019-11-02 DIAGNOSIS — Z9189 Other specified personal risk factors, not elsewhere classified: Secondary | ICD-10-CM | POA: Diagnosis not present

## 2019-11-02 DIAGNOSIS — M545 Low back pain, unspecified: Secondary | ICD-10-CM

## 2019-11-02 DIAGNOSIS — E559 Vitamin D deficiency, unspecified: Secondary | ICD-10-CM | POA: Diagnosis not present

## 2019-11-02 DIAGNOSIS — G8929 Other chronic pain: Secondary | ICD-10-CM

## 2019-11-02 DIAGNOSIS — R7303 Prediabetes: Secondary | ICD-10-CM

## 2019-11-02 DIAGNOSIS — R42 Dizziness and giddiness: Secondary | ICD-10-CM | POA: Diagnosis not present

## 2019-11-02 DIAGNOSIS — E8881 Metabolic syndrome: Secondary | ICD-10-CM | POA: Diagnosis not present

## 2019-11-02 DIAGNOSIS — Z6835 Body mass index (BMI) 35.0-35.9, adult: Secondary | ICD-10-CM

## 2019-11-02 MED ORDER — GLUCOSE BLOOD VI STRP: ORAL_STRIP | 0 refills | Status: DC

## 2019-11-02 MED ORDER — AMBULATORY NON FORMULARY MEDICATION: 0 refills | Status: DC

## 2019-11-02 MED ORDER — VITAMIN D (ERGOCALCIFEROL) 1.25 MG (50000 UNIT) PO CAPS: 50000.0000 [IU] | ORAL_CAPSULE | ORAL | 0 refills | Status: DC

## 2019-11-02 MED ORDER — BLOOD GLUCOSE METER KIT: PACK | 0 refills | Status: DC

## 2019-11-02 MED ORDER — LANCETS MISC: 0 refills | Status: DC

## 2019-11-02 MED FILL — FREESTYLE LANCETS: 90 days supply | Qty: 100 | Fill #0

## 2019-11-02 MED FILL — FREESTYLE LITE METER: 30 days supply | Qty: 1 | Fill #0

## 2019-11-02 MED FILL — FREESTYLE LITE TEST STRIP: 50 days supply | Qty: 50 | Fill #0

## 2019-11-02 NOTE — Addendum Note (Signed)
Addended by: Judy Pimple R on: 11/02/2019 12:12 PM   Modules accepted: Orders

## 2019-11-02 NOTE — Progress Notes (Signed)
I, Wendy Poet, LAT, ATC, am serving as scribe for Dr. Lynne Leader.  Crystal Madden is a 44 y.o. female who presents to [place] at [facility] today for f/u of low back pain and L ant-lateral hip pain and for review of her C-spine and L-spine MRIs.  She was last seen by Dr. Georgina Snell on 09/28/19 for her LBP and L hip pain, noting improvement in her L hip pain but no change in her LBP.  She then went to the [facility] ED on 10/19/19 w/ c/o R UE and LE numbness/tingling.  Since her last visit, pt reports that she con't to have tingling in her R arm, mostly in her R hand.  She's also had some intermittent loss of grip strength in her R hand.  She con't to have pain in her lower back that radiates into her B lateral hips and thighs.    She was seen in the emergency room recently for a lightheaded episode associate with tingling.  EKG showed sinus tachycardia mildly.  Labs are normal and brain MRI was fortunately normal.  She is not had repeat symptoms since.  Diagnostic imaging: L-spine and L hip XR- 08/25/19; CT head and brain MRI- 10/19/19; C-spine and L-spine- 10/29/19  Pertinent review of systems: No fevers or chills  Relevant historical information: Mild elevated blood sugar with elevated insulin level   Exam:  BP 110/74 (BP Location: Right Arm, Patient Position: Sitting, Cuff Size: Large)    Pulse 96    Ht 5\' 6"  (1.676 m)    Wt 227 lb 6.4 oz (103.1 kg)    LMP 10/13/2019 (Approximate) Comment: spotting with BC   SpO2 99%    BMI 36.70 kg/m  General: Well Developed, well nourished, and in no acute distress.   MSK:  C-spine: Normal-appearing nontender midline normal cervical motion.  Upper semistrength reflexes and sensation are intact.  L-spine: Nontender midline.  Decreased lumbar motion.  Normal lumbar strength normal gait.    Lab and Radiology Results DG Lumbar Spine Complete  Result Date: 08/25/2019 CLINICAL DATA:  Pain with concern for radicular symptoms on the  left EXAM: LUMBAR SPINE - COMPLETE 4+ VIEW COMPARISON:  Lumbar MRI November 21, 2016 FINDINGS: Frontal, lateral, spot lumbosacral lateral, and bilateral oblique views were obtained. There are 5 non-rib-bearing lumbar type vertebral bodies. Note that there is a partial assimilation joint on the left at L5. There is no acute fracture or spondylolisthesis. Cortical irregularity along the anterior aspect of the L2 vertebral body appears chronic and likely indicative of old trauma in this area. There is moderately severe disc space narrowing at L1-2 and L2-3 with moderate disc space narrowing at L3-4. There is mild facet osteoarthritic change at L2-3 and L3-4 bilaterally. No erosive change. IMPRESSION: Question old trauma involving the anterior aspect of the L2 vertebral body. No acute fracture. No spondylolisthesis. Osteoarthritic change at several levels noted. Electronically Signed   By: Lowella Grip III M.D.   On: 08/25/2019 11:58   CT HEAD WO CONTRAST  Result Date: 10/19/2019 CLINICAL DATA:  Transient ischemic attack (TIA) Possible stroke tingling in right arm and leg. EXAM: CT HEAD WITHOUT CONTRAST TECHNIQUE: Contiguous axial images were obtained from the base of the skull through the vertex without intravenous contrast. COMPARISON:  None. FINDINGS: Brain: No intracranial hemorrhage, mass effect, or midline shift. No hydrocephalus. The basilar cisterns are patent. No evidence of territorial infarct or acute ischemia. No extra-axial or intracranial fluid collection. Vascular: No  hyperdense vessel or unexpected calcification. Skull: Normal. Negative for fracture or focal lesion. Sinuses/Orbits: Paranasal sinuses and mastoid air cells are clear. The visualized orbits are unremarkable. Other: None. IMPRESSION: Negative noncontrast head CT. Electronically Signed   By: Keith Rake M.D.   On: 10/19/2019 19:46   MR BRAIN WO CONTRAST  Result Date: 10/19/2019 CLINICAL DATA:  Initial evaluation for acute  right-sided numbness. EXAM: MRI HEAD WITHOUT CONTRAST TECHNIQUE: Multiplanar, multiecho pulse sequences of the brain and surrounding structures were obtained without intravenous contrast. COMPARISON:  Prior head CT from earlier same day. FINDINGS: Brain: Cerebral volume within normal limits. Few punctate subcentimeter foci of T2/FLAIR hyperintensity noted within the subcortical/deep white matter of the anterior right frontal lobe, nonspecific, but felt to be of doubtful significance. No other focal parenchymal signal abnormality. No abnormal foci of restricted diffusion to suggest acute or subacute ischemia. Gray-white matter differentiation maintained. No encephalomalacia to suggest chronic cortical infarction. No evidence for acute or chronic intracranial hemorrhage. No mass lesion, midline shift or mass effect. No hydrocephalus or extra-axial fluid collection. Pituitary gland normal. Midline structures intact. Vascular: Major intracranial vascular flow voids are maintained. Skull and upper cervical spine: Craniocervical junction normal. Bone marrow signal intensity normal. No scalp soft tissue abnormality. Sinuses/Orbits: Globes and orbital soft tissues within normal limits. Paranasal sinuses are clear. No mastoid effusion. Inner ear structures normal. Other: None. IMPRESSION: Normal brain MRI.  No acute intracranial abnormality identified. Electronically Signed   By: Jeannine Boga M.D.   On: 10/19/2019 23:56   MR Cervical Spine Wo Contrast  Result Date: 10/29/2019 CLINICAL DATA:  Paresthesias. Trunk numbness or tingling. Additional history provided: Paresthesias, right-sided numbness/tingling/loss of strength right hand for 1 month. EXAM: MRI CERVICAL SPINE WITHOUT CONTRAST TECHNIQUE: Multiplanar, multisequence MR imaging of the cervical spine was performed. No intravenous contrast was administered. COMPARISON:  No pertinent prior studies available for comparison. FINDINGS: Alignment: Straightening of  the expected cervical lordosis. No significant spondylolisthesis Vertebrae: Vertebral body height is maintained. No marrow edema or suspicious osseous lesion. Tiny benign appearing cystic lesion versus annular fissure along the posterosuperior aspect of the C5 vertebral body. Cord: No spinal cord signal abnormality. Posterior Fossa, vertebral arteries, paraspinal tissues: No abnormality identified within included portions of the posterior fossa. Flow voids preserved within the cervical vertebral arteries. Paraspinal soft tissues within normal limits. Disc levels: Mild-to-moderate disc degeneration throughout the cervical spine. Congenitally narrow cervical spinal canal. C2-C3: No disc herniation. No significant canal or foraminal stenosis. C3-C4: Minimal disc bulge right-sided disc osteophyte ridge/uncinate hypertrophy contributes to moderate/severe right neural foraminal narrowing. Left-sided uncinate hypertrophy contributes to mild left neural foraminal narrowing. Mild degenerative spinal canal stenosis. C4-C5: Minimal disc bulge. Bilateral disc osteophyte ridge/uncinate hypertrophy. Mild degenerative spinal canal narrowing. Mild bilateral neural foraminal narrowing (greater on the left). C5-C6: No significant disc herniation or spinal canal stenosis. Right greater than left uncinate hypertrophy. Mild right neural foraminal narrowing. C6-C7: No disc herniation. No significant canal or foraminal stenosis. C7-T1: No disc herniation. No significant canal or foraminal stenosis. IMPRESSION: Cervical spondylosis, as outlined and superimposed upon a congenitally narrow cervical spinal canal. No more than mild degenerative spinal canal narrowing at any level. Multilevel neural foraminal narrowing as described and greatest on the right at C3-C4 where right-sided disc osteophyte ridge contributes to moderate/severe foraminal stenosis. Correlate for right C4 radiculopathy. Electronically Signed   By: Kellie Simmering DO   On:  10/29/2019 08:58   MR Lumbar Spine Wo Contrast  Result Date: 10/29/2019 CLINICAL  DATA:  Initial evaluation for low back pain with bilateral groin pain, right leg weakness for 1 year. History of prior discectomy. EXAM: MRI LUMBAR SPINE WITHOUT CONTRAST TECHNIQUE: Multiplanar, multisequence MR imaging of the lumbar spine was performed. No intravenous contrast was administered. COMPARISON:  Previous MRI from 11/21/2016 FINDINGS: Segmentation: Standard. Lowest well-formed disc space labeled the L5-S1 level. Alignment: Mild straightening and reversal of the normal lumbar lordosis, stable. No listhesis or subluxation. Vertebrae: Vertebral body height maintained without evidence for acute or chronic fracture. Bone marrow signal intensity mildly heterogeneous but within normal limits. Discogenic reactive endplate changes noted about the L2-3 interspace anteriorly. No other abnormal marrow edema. Conus medullaris and cauda equina: Conus extends to the L1 level. Conus and cauda equina appear normal. Paraspinal and other soft tissues: Chronic postoperative changes noted within the posterior paraspinous soft tissues. Paraspinous tissues demonstrate no acute finding. Visualized visceral structures within normal limits. Disc levels: T11-12: Unremarkable T12-L1: Normal interspace. Mild facet hypertrophy. No stenosis or impingement. L1-2: Chronic intervertebral disc space narrowing with diffuse disc bulge and disc desiccation. Mild reactive endplate changes with marginal endplate osteophytic spurring. Superimposed mild facet hypertrophy. No significant canal or lateral recess stenosis. Foramina remain patent. L2-3: Chronic intervertebral disc space narrowing with diffuse disc bulge and disc desiccation. Disc bulging slightly asymmetric left. Superimposed reactive endplate changes with marginal endplate osteophytic spurring. Resultant moderate canal with moderate left greater than right lateral recess stenosis. Moderate  bilateral L2 foraminal narrowing, also greater on the left. Appearance is similar to previous. L3-4: Mild diffuse disc bulge with disc desiccation. Superimposed mild reactive endplate changes. Postoperative changes from prior left hemi laminectomy and micro discectomy. Disc bulging slightly asymmetric to the left with superimposed left foraminal disc protrusion, contacting the exiting left L3 nerve (series 3, image 11). Superimposed mild facet hypertrophy. Trace bilateral joint effusions noted. Mild narrowing of the left lateral recess, similar to previous. Central canal remains patent. Mild to moderate bilateral L3 foraminal narrowing, greater on the left. Overall, appearance is similar to previous. L4-5: Disc desiccation with mild disc bulge. Postoperative changes from prior left hemi laminectomy and micro discectomy. Small residual left subarticular disc protrusion with slight inferior angulation (series 2, image 9). Mild bilateral facet hypertrophy. Persistent moderate left lateral recess narrowing. Central canal remains patent. No significant foraminal stenosis. L5-S1: Normal interspace. Mild bilateral facet hypertrophy. No stenosis or impingement. IMPRESSION: 1. Multifactorial degenerative changes at L2-3 with resultant moderate canal with left greater than right lateral recess stenosis, similar to previous. 2. Degenerative disc bulge with superimposed left foraminal disc protrusion at L3-4, contacting the exiting left L3 nerve. 3. Postoperative changes from prior left hemi laminectomy and micro discectomy at L3-4 and L4-5 with small residual left subarticular disc protrusion at L4-5. Residual mild to moderate left lateral recess narrowing at these levels is relatively unchanged. Electronically Signed   By: Jeannine Boga M.D.   On: 10/29/2019 22:33   DG Hip Unilat W OR W/O Pelvis 2-3 Views Left  Result Date: 08/25/2019 CLINICAL DATA:  Pain EXAM: DG HIP  2-3V LEFT COMPARISON:  None. FINDINGS: Frontal  and lateral views were obtained. No fracture or dislocation. There is slight disc space narrowing along the superior aspect of the joint, particularly noted on the lateral view. The joint spaces appear normal. No appreciable acetabular bony overgrowth. No intra-articular calcification or erosion. IMPRESSION: Slight narrowing along the superior aspect of the hip joint, best appreciated on the lateral view. No associated bony overgrowth in this area. There  may be a degree of impingement in part due to the narrowing of the joint in this area. No erosive change. No fracture or dislocation. Electronically Signed   By: Lowella Grip III M.D.   On: 08/25/2019 11:55   I, Lynne Leader, personally (independently) visualized and performed the interpretation of the images attached in this note.     Assessment and Plan: 44 y.o. female with  Low back pain: Chronic.  Patient does have degenerative changes seen facet joints.  L3-4 bilaterally looks to be the worst.  This correlates to the location of her pain.  Plan for facet injections bilaterally at this level and recheck or semiupdate after injection.  Patient also has some paresthesias into her right hand associated pain.  This probably correlates to C6 or C7 radiculopathy.  She does have some changes at this level on cervical MRI however they are not the dominant finding.  She does not have much symptoms at C4 which is the worst appearing level on her recent cervical MRI.  Lightheadedness and paresthesias: Requiring emergency room visit the other day.  Fortunately initial work-up was negative.  Differential could be presyncopal, arrhythmia, or even hypoglycemia.  Will defer this matter further to PCP however will prescribe a glucometer so that she can start checking her blood sugars to get a baseline and then check it if she has symptoms again.  Recommend follow-up with PCP for further work-up.  May consider Holter monitor echocardiogram etc.    Orders  Placed This Encounter  Procedures   DG INJECT DIAG/THERA/INC NEEDLE/CATH/PLC EPI/LUMB/SAC W/IMG    Standing Status:   Future    Standing Expiration Date:   01/01/2021    Order Specific Question:   Reason for Exam (SYMPTOM  OR DIAGNOSIS REQUIRED)    Answer:   Bl L3-4    Order Specific Question:   Is the patient pregnant?    Answer:   No    Order Specific Question:   Preferred Imaging Location?    Answer:   [id number]    Order Specific Question:   Radiology Contrast Protocol - do NOT remove file path    Answer:   [email or web address]   Meds ordered this encounter  Medications   AMBULATORY NON FORMULARY MEDICATION    Sig: Single glucometer with lancets, test strips. Test daily R73.03. Prediabetes    Dispense:  1 each    Refill:  0     Discussed warning signs or symptoms. Please see discharge instructions. Patient expresses understanding.   The above documentation has been reviewed and is accurate and complete Lynne Leader

## 2019-11-02 NOTE — Patient Instructions (Addendum)
Thank you for coming in today. I will talk with Crystal Madden about your heart work up if needed further.  Check you blood sugar on a semi-regular basis and when feeling dizzy to prove if your blood sugar is going low.   Plan for facet injections in your low back at L3-4 both sides.  Recheck following injections in a few weeks or a month.   Phone number for injection scheduling: [phone] Make sure you are off blood thinners like aspirin 1 week prior to injection.   Near-Syncope Near-syncope is when you suddenly get weak or dizzy, or you feel like you might pass out (faint). This may also be called presyncope. This is due to a lack of blood flow to the brain. During an episode of near-syncope, you may:  Feel dizzy, weak, or light-headed.  Feel sick to your stomach (nauseous).  See all white or all black.  See spots.  Have cold, clammy skin. This condition is caused by a sudden decrease in blood flow to the brain. This decrease can result from various causes, but most of those causes are not dangerous. However, near-syncope may be a sign of a serious medical problem, so it is important to seek medical care. Follow these instructions at home: Medicines  Take over-the-counter and prescription medicines only as told by your doctor.  If you are taking blood pressure or heart medicine, get up slowly and spend many minutes getting ready to sit and then stand. This can help with dizziness. General instructions  Be aware of any changes in your symptoms.  Talk with your doctor about your symptoms. You may need to have testing to find the cause of your near-syncope.  If you start to feel like you might pass out, lie down right away. Raise (elevate) your feet above the level of your heart. Breathe deeply and steadily. Wait until all of the symptoms are gone.  Have someone stay with you until you feel stable.  Do not drive, use machinery, or play sports until your doctor says it is okay.   Drink enough fluid to keep your pee (urine) pale yellow.  Keep all follow-up visits as told by your doctor. This is important. Get help right away if you:  Have a seizure.  Have pain in your: ? Chest. ? Belly (abdomen). ? Back.  Faint once or more than once.  Have a very bad headache.  Are bleeding from your mouth or butt.  Have black or tarry poop (stool).  Have a very fast or uneven heartbeat (palpitations).  Are mixed up (confused).  Have trouble walking.  Are very weak.  Have trouble seeing. These symptoms may be an emergency. Do not wait to see if the symptoms will go away. Get medical help right away. Call your local emergency services (911 in the [place] Do not drive yourself to the hospital. Summary  Near-syncope is when you suddenly get weak or dizzy, or you feel like you might pass out (faint).  This condition is caused by a lack of blood flow to the brain.  Near-syncope may be a sign of a serious medical problem, so it is important to seek medical care. This information is not intended to replace advice given to you by your health care provider. Make sure you discuss any questions you have with your health care provider. Document Revised: 11/19/2018 Document Reviewed: 06/16/2018 Elsevier Patient Education  [employer].

## 2019-11-03 ENCOUNTER — Ambulatory Visit (INDEPENDENT_AMBULATORY_CARE_PROVIDER_SITE_OTHER): Admitting: Psychology

## 2019-11-03 NOTE — Progress Notes (Signed)
Chief Complaint:   OBESITY Crystal Madden is here to discuss her progress with her obesity treatment plan along with follow-up of her obesity related diagnoses. Crystal Madden is on the Category 3 Plan and states she is following her eating plan approximately 80% of the time. Crystal Madden states she is doing 0 minutes 0 times per week.  Today's visit was #: 3 Starting weight: 230 lbs Starting date: 10/04/2019 Today's weight: 222 lbs Today's date: 11/02/2019 Total lbs lost to date: 8 Total lbs lost since last in-office visit: 6  Interim History: Crystal Madden reports upcoming struggle is meal prepping and healthy food options during baseball season. The baseball field is 1 hour away. I recommended higher protein snack options. She was recently seen in the emergency department, by her primary care physician, and by sports medicine for lightheadedness and paresthesias.  Subjective:   1. Insulin resistance Crystal Madden's fasting insulin level was 26.4 on 10/04/2019. She is tolerating metformin 500 mg q daily, and she denies GI upset.  2. Vitamin D deficiency Crystal Madden's last Vit D level was 28.5 on 10/04/2019. Her goal is >50. She is currently on prescription strength Vit D supplementation.  3. At risk for heart disease Crystal Madden is at a higher than average risk for cardiovascular disease due to hypertension and insulin resistance. Reviewed: no chest pain on exertion, no dyspnea on exertion, and no swelling of ankles.  Assessment/Plan:   1. Insulin resistance Crystal Madden will continue her Category 3 meal plan, and will continue to work on weight loss, exercise, and decreasing simple carbohydrates to help decrease the risk of diabetes. We will refill metformin for 1 month. We will recheck labs in 2 months.  Crystal Madden agreed to follow-up with [place] as directed to closely monitor her progress.  2. Vitamin D deficiency Low Vitamin D level contributes to fatigue and are associated with obesity, breast, and colon cancer. We  will refill prescription Vitamin D for 1 month. Crystal Madden will follow-up for routine testing of Vitamin D, at least 2-3 times per year to avoid over-replacement.  - Vitamin D, Ergocalciferol, (DRISDOL) 1.25 MG (50000 UNIT) CAPS capsule; Take 1 capsule (50,000 Units total) by mouth every 7 (seven) days.  Dispense: 4 capsule; Refill: 0  3. At risk for heart disease Crystal Madden was given approximately 15 minutes of coronary artery disease prevention counseling today. She is 44 y.o. female and has risk factors for heart disease including obesity, hypertension, and insulin resistance. We discussed intensive lifestyle modifications today with an emphasis on specific weight loss instructions and strategies.   Repetitive spaced learning was employed today to elicit superior memory formation and behavioral change.  4. Class 2 severe obesity with serious comorbidity and body mass index (BMI) of 35.0 to 35.9 in adult, unspecified obesity type Crystal Madden) Crystal Madden is currently in the action stage of change. As such, her goal is to continue with weight loss efforts. She has agreed to the Category 3 Plan.   Exercise goals: No exercise has been prescribed at this time.  Behavioral modification strategies: meal planning and cooking strategies and better snacking choices.  Crystal Madden has agreed to follow-up with our clinic in 2 weeks. She was informed of the importance of frequent follow-up visits to maximize her success with intensive lifestyle modifications for her multiple health conditions.   Objective:   Blood pressure 113/70, pulse 92, temperature 98.1 F (36.7 C), temperature source Oral, height 5\' 6"  (1.676 m), weight 222 lb (100.7 kg), last menstrual period 10/13/2019, SpO2 99 %. Body mass index  is 35.83 kg/m.  General: Cooperative, alert, well developed, in no acute distress. HEENT: Conjunctivae and lids unremarkable. Cardiovascular: Regular rhythm.  Lungs: Normal work of breathing. Neurologic: No focal  deficits.   Lab Results  Component Value Date   CREATININE 0.90 10/19/2019   BUN 14 10/19/2019   NA 137 10/19/2019   K 3.9 10/19/2019   CL 103 10/19/2019   CO2 23 10/19/2019   Lab Results  Component Value Date   ALT 36 10/19/2019   AST 34 10/19/2019   ALKPHOS 44 10/19/2019   BILITOT 1.2 10/19/2019   Lab Results  Component Value Date   HGBA1C 5.4 10/04/2019   HGBA1C 5.3 05/31/2019   HGBA1C 5.0 12/14/2017   Lab Results  Component Value Date   INSULIN 26.4 (H) 10/04/2019   Lab Results  Component Value Date   TSH 3.290 10/04/2019   Lab Results  Component Value Date   CHOL 209 (H) 10/04/2019   HDL 46 10/04/2019   LDLCALC 130 (H) 10/04/2019   LDLDIRECT 141.0 05/31/2019   TRIG 184 (H) 10/04/2019   CHOLHDL 5 05/31/2019   Lab Results  Component Value Date   WBC 8.3 10/19/2019   HGB 14.3 10/19/2019   HCT 41.9 10/19/2019   MCV 95.2 10/19/2019   PLT 215 10/19/2019   No results found for: IRON, TIBC, FERRITIN  Attestation Statements:   Reviewed by clinician on day of visit: allergies, medications, problem list, medical history, surgical history, family history, social history, and previous encounter notes.   I, Trixie Dredge, am acting as transcriptionist for Dennard Nip, MD.  I have reviewed the above documentation for accuracy and completeness, and I agree with the above. -  Dennard Nip, MD

## 2019-11-05 ENCOUNTER — Other Ambulatory Visit: Payer: No Typology Code available for payment source

## 2019-11-07 ENCOUNTER — Inpatient Hospital Stay: Admission: RE | Admit: 2019-11-07 | Payer: No Typology Code available for payment source | Source: Ambulatory Visit

## 2019-11-11 ENCOUNTER — Other Ambulatory Visit (INDEPENDENT_AMBULATORY_CARE_PROVIDER_SITE_OTHER): Payer: Self-pay | Admitting: Family Medicine

## 2019-11-11 ENCOUNTER — Other Ambulatory Visit: Payer: Self-pay

## 2019-11-11 ENCOUNTER — Ambulatory Visit
Admission: RE | Admit: 2019-11-11 | Discharge: 2019-11-11 | Disposition: A | Discharge status: Home / Self Care | Payer: No Typology Code available for payment source | Source: Ambulatory Visit | Attending: Family Medicine | Admitting: Family Medicine

## 2019-11-11 ENCOUNTER — Other Ambulatory Visit: Payer: Self-pay | Admitting: Family Medicine

## 2019-11-11 DIAGNOSIS — G8929 Other chronic pain: Secondary | ICD-10-CM

## 2019-11-11 DIAGNOSIS — E8881 Metabolic syndrome: Secondary | ICD-10-CM

## 2019-11-11 IMAGING — XA DG FACET JT INJ L OR S SPINE SINGLE LEVEL UNI
2 series · 2 of 2 positions shown · non-contrast
Comparison: none

CLINICAL DATA: Lumbosacral spondylosis without myelopathy. Lumbar
facet arthropathy. Bilateral low back pain. Prior laminectomies at
L3-4 and L4-5.

[Series 1: ortho standard · 1 of 1 slices shown (1 of 2)]
[im 1/1]
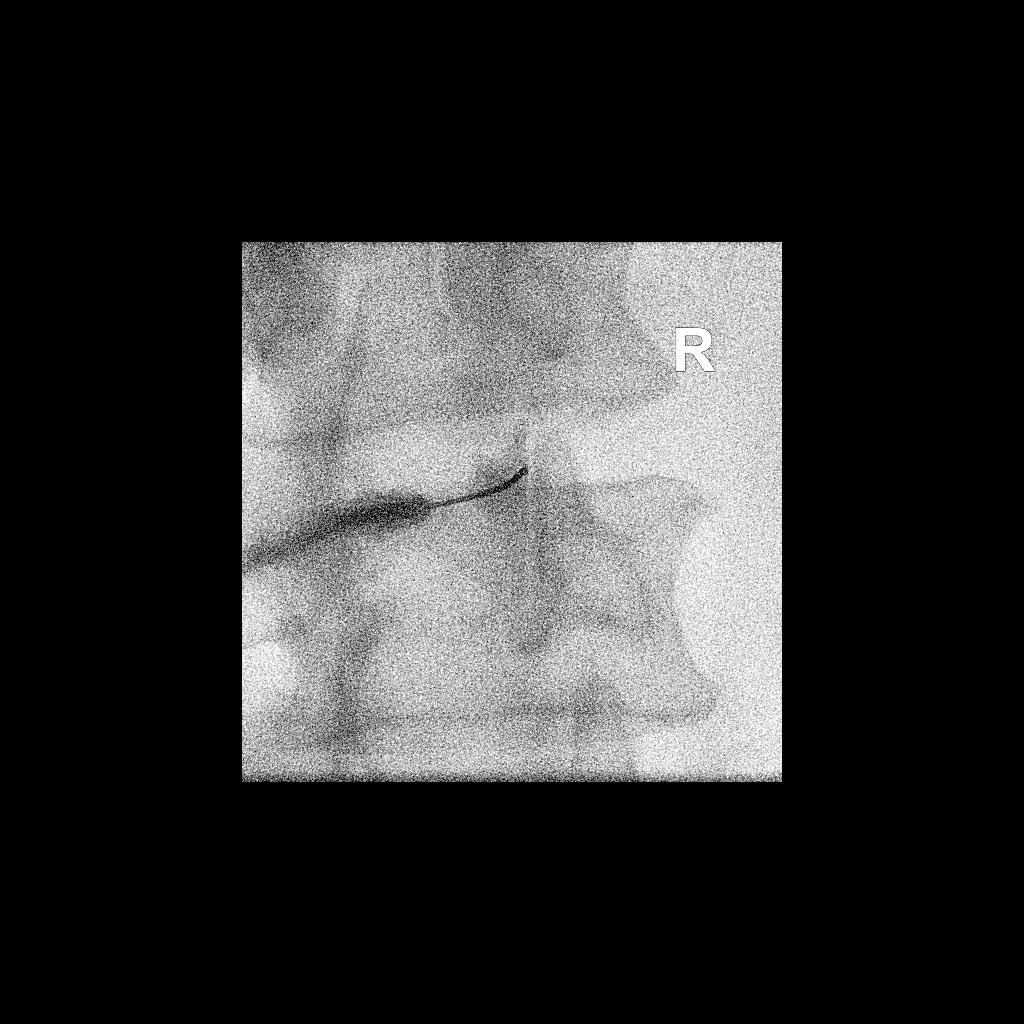

[Series 2: ortho standard · 1 of 1 slices shown (2 of 2)]
[im 1/1]
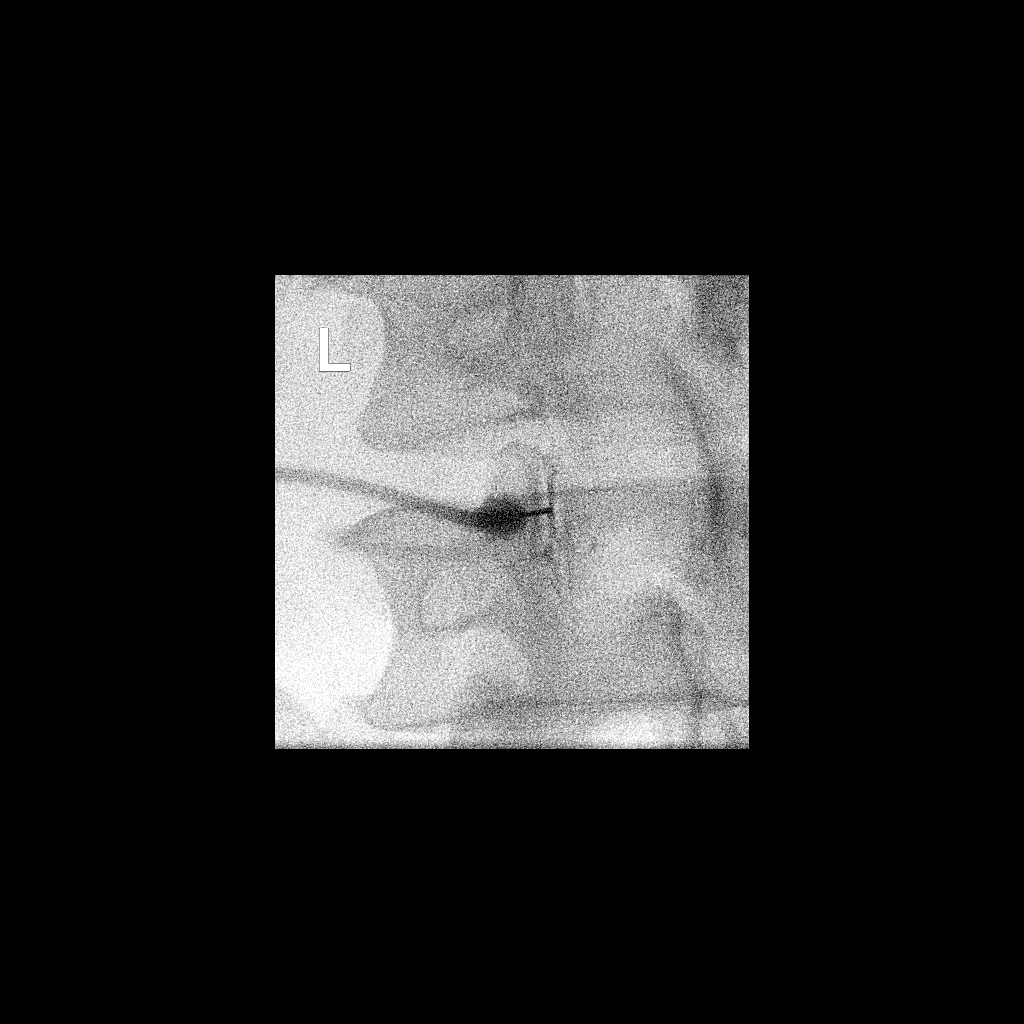

[2 of 2 positions shown; findings below may reference images not displayed]

FLUOROSCOPY TIME:  Fluoroscopy Time: 40 seconds

Radiation Exposure Index: 20.67 microGray*m^2

PROCEDURE:
The procedure, risks, benefits, and alternatives were explained to
the patient. Questions regarding the procedure were encouraged and
answered. The patient understands and consents to the procedure.

BILATERAL L3-4 FACET INJECTIONS: A posterior oblique approach was
taken to the facets on the right and on the left at L3-4 using
curved 3.5 inch 22 gauge spinal needles. Intra-articular positioning
was confirmed by injecting a small amount of Isovue-M 200. No
vascular opacification is seen. 60 mg of Depo-Medrol mixed with 1 mL
of 1% lidocaine were instilled on each side. On the left, increasing
back pressure was felt during the injection as the joint was
distended, and the final [DATE] of the injectate was deposited into the
juxta-articular soft tissues. The entire volume was deposited
intra-articularly on the right. The procedure was well-tolerated.
IMPRESSION: Technically successful bilateral L3-4 facet injections.

## 2019-11-11 IMAGING — XA DG FACET JT INJ L OR S SPINE SINGLE LEVEL UNI
2 series · 2 of 2 positions shown · non-contrast
Comparison: none

CLINICAL DATA: Lumbosacral spondylosis without myelopathy. Lumbar
facet arthropathy. Bilateral low back pain. Prior laminectomies at
L3-4 and L4-5.

[Series 1: ortho standard · 1 of 1 slices shown (1 of 2)]
[im 1/1]
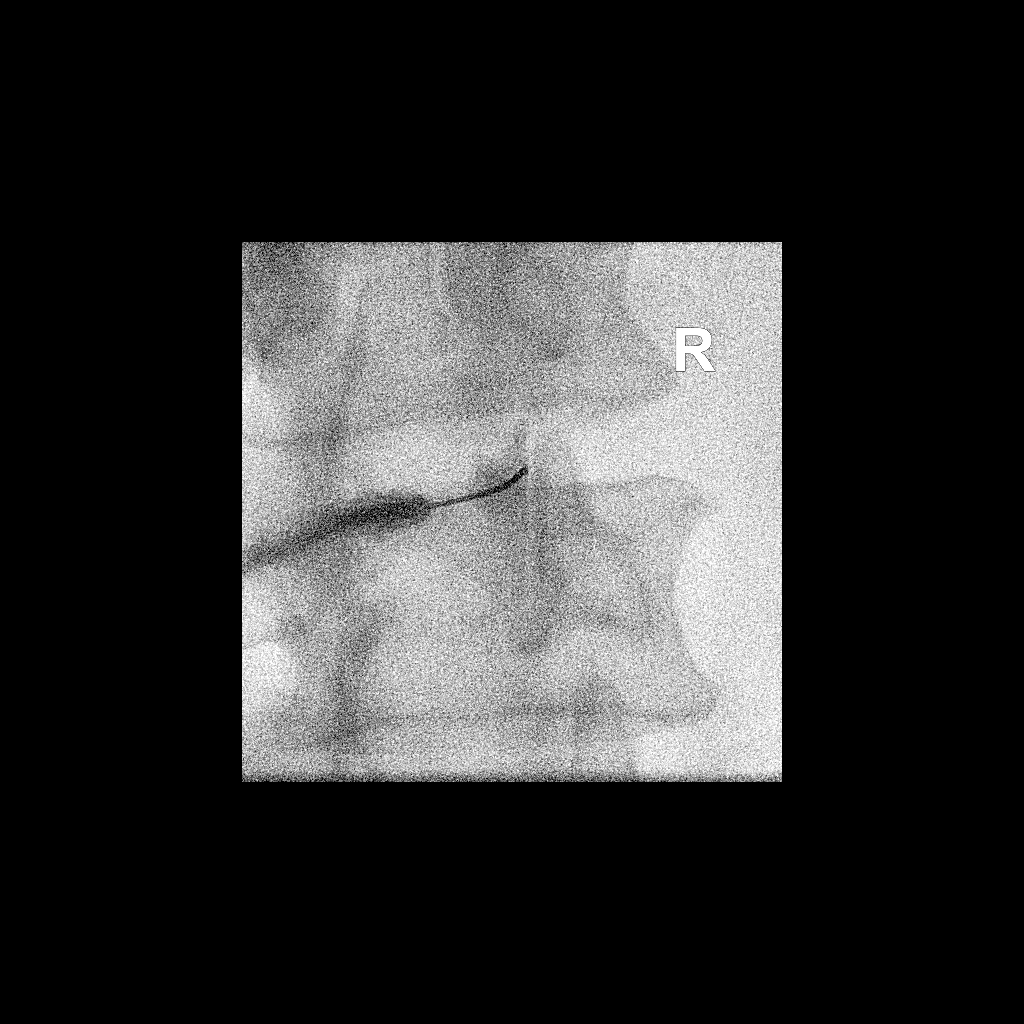

[Series 2: ortho standard · 1 of 1 slices shown (2 of 2)]
[im 1/1]
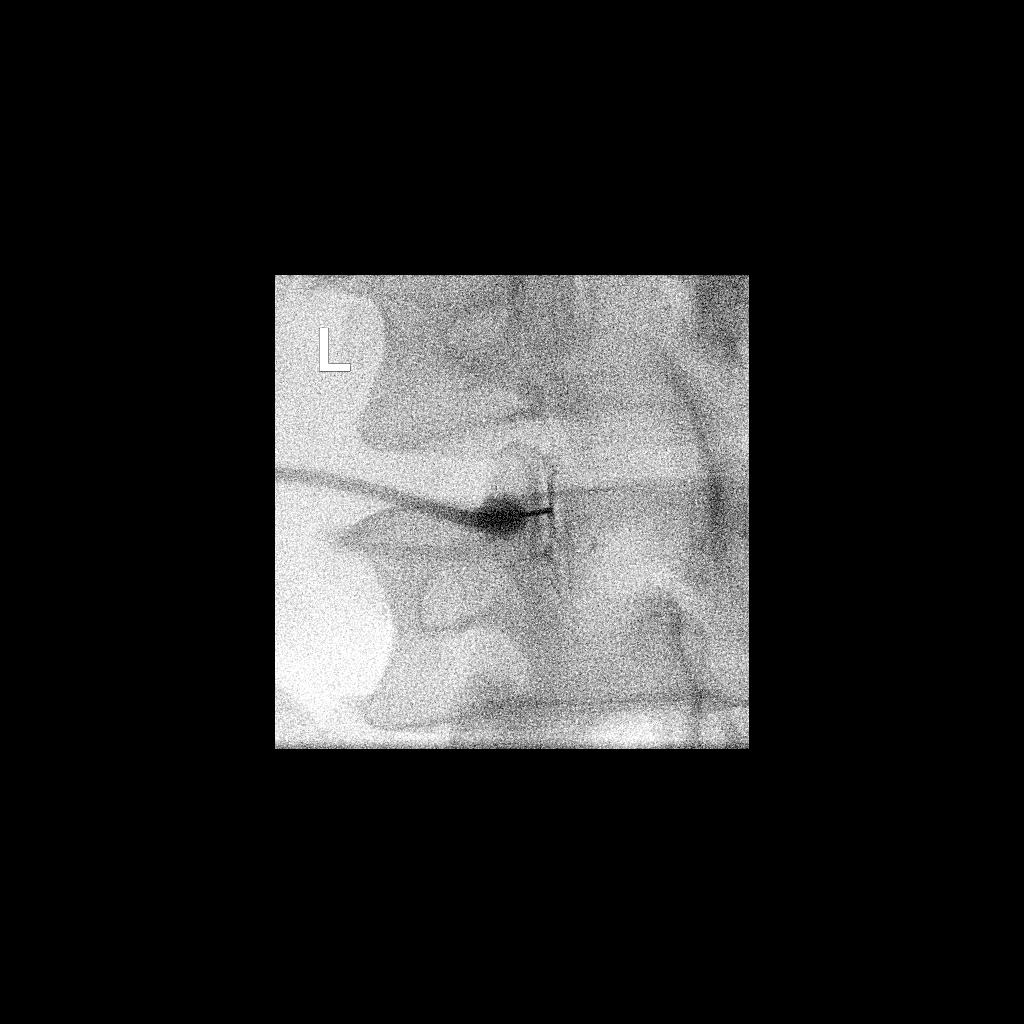

[2 of 2 positions shown; findings below may reference images not displayed]

FLUOROSCOPY TIME:  Fluoroscopy Time: 40 seconds

Radiation Exposure Index: 20.67 microGray*m^2

PROCEDURE:
The procedure, risks, benefits, and alternatives were explained to
the patient. Questions regarding the procedure were encouraged and
answered. The patient understands and consents to the procedure.

BILATERAL L3-4 FACET INJECTIONS: A posterior oblique approach was
taken to the facets on the right and on the left at L3-4 using
curved 3.5 inch 22 gauge spinal needles. Intra-articular positioning
was confirmed by injecting a small amount of Isovue-M 200. No
vascular opacification is seen. 60 mg of Depo-Medrol mixed with 1 mL
of 1% lidocaine were instilled on each side. On the left, increasing
back pressure was felt during the injection as the joint was
distended, and the final [DATE] of the injectate was deposited into the
juxta-articular soft tissues. The entire volume was deposited
intra-articularly on the right. The procedure was well-tolerated.
IMPRESSION: Technically successful bilateral L3-4 facet injections.

## 2019-11-11 MED ORDER — IOPAMIDOL (ISOVUE-M 200) INJECTION 41%
1.0000 mL | Freq: Once | INTRAMUSCULAR | Status: AC
  Administered 2019-11-11: 1 mL via INTRA_ARTICULAR

## 2019-11-11 MED ORDER — METHYLPREDNISOLONE ACETATE 40 MG/ML INJ SUSP (RADIOLOG
120.0000 mg | Freq: Once | INTRAMUSCULAR | Status: AC
  Administered 2019-11-11: 120 mg via INTRA_ARTICULAR

## 2019-11-11 MED FILL — VALSARTAN-HCTZ 80-12.5 MG T: 80-12.5 | 30 days supply | Qty: 30 | Fill #5

## 2019-11-11 MED FILL — VIT D2 1.25 MG (50,000 UNIT: 1.25 MG | 28 days supply | Qty: 4 | Fill #0

## 2019-11-11 NOTE — Discharge Instructions (Signed)

## 2019-11-14 ENCOUNTER — Other Ambulatory Visit (INDEPENDENT_AMBULATORY_CARE_PROVIDER_SITE_OTHER): Payer: Self-pay | Admitting: Family Medicine

## 2019-11-14 DIAGNOSIS — E8881 Metabolic syndrome: Secondary | ICD-10-CM

## 2019-11-14 MED FILL — NORLYDA 0.35 MG TABS: 0.35 | 84 days supply | Qty: 84 | Fill #2

## 2019-11-16 ENCOUNTER — Other Ambulatory Visit: Payer: Self-pay

## 2019-11-16 ENCOUNTER — Ambulatory Visit (INDEPENDENT_AMBULATORY_CARE_PROVIDER_SITE_OTHER): Payer: No Typology Code available for payment source | Admitting: Family Medicine

## 2019-11-16 ENCOUNTER — Encounter (INDEPENDENT_AMBULATORY_CARE_PROVIDER_SITE_OTHER): Payer: Self-pay | Admitting: Family Medicine

## 2019-11-16 VITALS — BP 126/76 | HR 95 | Temp 97.9°F | Ht 66.0 in | Wt 220.0 lb

## 2019-11-16 DIAGNOSIS — Z6835 Body mass index (BMI) 35.0-35.9, adult: Secondary | ICD-10-CM

## 2019-11-16 DIAGNOSIS — E8881 Metabolic syndrome: Secondary | ICD-10-CM

## 2019-11-16 DIAGNOSIS — E559 Vitamin D deficiency, unspecified: Secondary | ICD-10-CM

## 2019-11-16 MED ORDER — METFORMIN HCL 500 MG PO TABS: 500.0000 mg | ORAL_TABLET | Freq: Every morning | ORAL | 0 refills | Status: DC

## 2019-11-16 MED FILL — metFORMIN HCL 500 MG TABS: 500 | 30 days supply | Qty: 30 | Fill #0

## 2019-11-16 NOTE — Progress Notes (Signed)
Chief Complaint:   OBESITY Crystal Madden is here to discuss her progress with her obesity treatment plan along with follow-up of her obesity related diagnoses. Crystal Madden is on the Category 3 Plan and states she is following her eating plan approximately 50-60% of the time. Crystal Madden states she is more mindful of her steps.   Today's visit was #: 4 Starting weight: 230 lbs Starting date: 10/04/2019 Today's weight: 220 lbs Today's date: 11/16/2019 Total lbs lost to date: 10 Total lbs lost since last in-office visit: 2  Interim History: Crystal Madden's resting heart rate range between 70 and 80 when she started the program, now her resting heart rate is in the mid 60's. She would like other snack options other than yogurt and cottage cheese. She was snacking more frequently on Spring break and Easter while her family was home for the holiday.  Subjective:   1. Insulin resistance Crystal Madden's insulin level on 10/04/2019 was 26.4. She is tolerating metformin well. She has a glucometer, however she denies hypoglycemia and has not been checking her BGs at home.  2. Vitamin D deficiency Crystal Madden's Vit D level on 10/04/2019 was 28.5. She is on prescription strength Vit D supplementation, and is tolerating it well.  Assessment/Plan:   1. Insulin resistance Crystal Madden will continue her Category 3 meal plan, and will continue to work on weight loss, exercise, and decreasing simple carbohydrates to help decrease the risk of diabetes. We will refill metformin for 1 month. We will recheck labs in May 2021. Crystal Madden agreed to follow-up with [place] as directed to closely monitor her progress.  - metFORMIN (GLUCOPHAGE) 500 MG tablet; Take 1 tablet (500 mg total) by mouth every morning.  Dispense: 30 tablet; Refill: 0  2. Vitamin D deficiency Low Vitamin D level contributes to fatigue and are associated with obesity, breast, and colon cancer. Crystal Madden agreed to continue taking prescription Vitamin D 50,000 IU every week  and will follow-up for routine testing of Vitamin D, at least 2-3 times per year to avoid over-replacement. We will recheck labs in May 2021.  3. Class 2 severe obesity with serious comorbidity and body mass index (BMI) of 35.0 to 35.9 in adult, unspecified obesity type Crystal Madden) Crystal Madden is currently in the action stage of change. As such, her goal is to continue with weight loss efforts. She has agreed to the Category 3 Plan.   Exercise goals: As is.  Behavioral modification strategies: increasing lean protein intake and better snacking choices.  Crystal Madden has agreed to follow-up with our clinic in 3 weeks. She was informed of the importance of frequent follow-up visits to maximize her success with intensive lifestyle modifications for her multiple health conditions.   Objective:   Blood pressure 126/76, pulse 95, temperature 97.9 F (36.6 C), temperature source Oral, height 5\' 6"  (1.676 m), weight 220 lb (99.8 kg), SpO2 98 %. Body mass index is 35.51 kg/m.  General: Cooperative, alert, well developed, in no acute distress. HEENT: Conjunctivae and lids unremarkable. Cardiovascular: Regular rhythm.  Lungs: Normal work of breathing. Neurologic: No focal deficits.   Lab Results  Component Value Date   CREATININE 0.90 10/19/2019   BUN 14 10/19/2019   NA 137 10/19/2019   K 3.9 10/19/2019   CL 103 10/19/2019   CO2 23 10/19/2019   Lab Results  Component Value Date   ALT 36 10/19/2019   AST 34 10/19/2019   ALKPHOS 44 10/19/2019   BILITOT 1.2 10/19/2019   Lab Results  Component Value Date  HGBA1C 5.4 10/04/2019   HGBA1C 5.3 05/31/2019   HGBA1C 5.0 12/14/2017   Lab Results  Component Value Date   INSULIN 26.4 (H) 10/04/2019   Lab Results  Component Value Date   TSH 3.290 10/04/2019   Lab Results  Component Value Date   CHOL 209 (H) 10/04/2019   HDL 46 10/04/2019   LDLCALC 130 (H) 10/04/2019   LDLDIRECT 141.0 05/31/2019   TRIG 184 (H) 10/04/2019   CHOLHDL 5 05/31/2019     Lab Results  Component Value Date   WBC 8.3 10/19/2019   HGB 14.3 10/19/2019   HCT 41.9 10/19/2019   MCV 95.2 10/19/2019   PLT 215 10/19/2019   No results found for: IRON, TIBC, FERRITIN  Attestation Statements:   Reviewed by clinician on day of visit: allergies, medications, problem list, medical history, surgical history, family history, social history, and previous encounter notes.   I, Trixie Dredge, am acting as transcriptionist for Dennard Nip, MD.  I have reviewed the above documentation for accuracy and completeness, and I agree with the above. -  Dennard Nip, MD

## 2019-11-17 MED FILL — VYVANSE 20 MG CAPSULE: 20 | 30 days supply | Qty: 30 | Fill #0

## 2019-12-07 ENCOUNTER — Encounter (INDEPENDENT_AMBULATORY_CARE_PROVIDER_SITE_OTHER): Payer: Self-pay | Admitting: Physician Assistant

## 2019-12-07 ENCOUNTER — Ambulatory Visit (INDEPENDENT_AMBULATORY_CARE_PROVIDER_SITE_OTHER): Payer: No Typology Code available for payment source | Admitting: Physician Assistant

## 2019-12-07 ENCOUNTER — Other Ambulatory Visit: Payer: Self-pay

## 2019-12-07 VITALS — BP 127/76 | HR 99 | Temp 98.7°F | Ht 66.0 in | Wt 216.0 lb

## 2019-12-07 DIAGNOSIS — Z6835 Body mass index (BMI) 35.0-35.9, adult: Secondary | ICD-10-CM

## 2019-12-07 DIAGNOSIS — E7849 Other hyperlipidemia: Secondary | ICD-10-CM

## 2019-12-07 DIAGNOSIS — E559 Vitamin D deficiency, unspecified: Secondary | ICD-10-CM | POA: Diagnosis not present

## 2019-12-07 DIAGNOSIS — Z9189 Other specified personal risk factors, not elsewhere classified: Secondary | ICD-10-CM | POA: Diagnosis not present

## 2019-12-07 MED ORDER — VITAMIN D (ERGOCALCIFEROL) 1.25 MG (50000 UNIT) PO CAPS: 50000.0000 [IU] | ORAL_CAPSULE | ORAL | 0 refills | Status: DC

## 2019-12-07 MED FILL — VIT D2 1.25 MG (50,000 UNIT: 1.25 MG | 28 days supply | Qty: 4 | Fill #0

## 2019-12-08 NOTE — Progress Notes (Signed)
Chief Complaint:   Crystal Madden is here to discuss her progress with her obesity treatment plan along with follow-up of her obesity related diagnoses. Crystal Madden is on the Category 3 Plan and states she is following her eating plan approximately 30-40% of the time. Crystal Madden states she is exercising 0 minutes 0 times per week.  Today's visit was #: 5 Starting weight: 230 lbs Starting date: 10/04/2019 Today's weight: 216 lbs Today's date: 12/07/2019 Total lbs lost to date: 14 Total lbs lost since last in-office visit: 4  Interim History: Crystal Madden reports that she is surprised with her weight loss due to having trouble with staying on plan with her sons in baseball season and a lot of traveling going on. She is getting bored with the plan in general and is tired of having to measure everything.  Subjective:   Vitamin D deficiency. Jonnisha is on prescription Vitamin D weekly. No nausea, vomiting, or muscle weakness. Last Vitamin D 28.5 on 10/04/2019.  Other hyperlipidemia. Myley is on no medication. No chest pain.   Lab Results  Component Value Date   CHOL 209 (H) 10/04/2019   HDL 46 10/04/2019   LDLCALC 130 (H) 10/04/2019   LDLDIRECT 141.0 05/31/2019   TRIG 184 (H) 10/04/2019   CHOLHDL 5 05/31/2019   Lab Results  Component Value Date   ALT 36 10/19/2019   AST 34 10/19/2019   ALKPHOS 44 10/19/2019   BILITOT 1.2 10/19/2019   The 10-year ASCVD risk score Crystal Bussing DC Jr., et al., 2013) is: 1.2%   Values used to calculate the score:     Age: 44 years     Sex: Female     Is Non-Hispanic African American: No     Diabetic: No     Tobacco smoker: No     Systolic Blood Pressure: [id number]     Is BP treated: Yes     HDL Cholesterol: 46 mg/dL     Total Cholesterol: 209 mg/dL  At risk for osteoporosis. Lannie is at higher risk of osteopenia and osteoporosis due to Vitamin D deficiency.   Assessment/Plan:   Vitamin D deficiency. Low Vitamin D level  contributes to fatigue and are associated with obesity, breast, and colon cancer. She was given a refill on her Vitamin D, Ergocalciferol, (DRISDOL) 1.25 MG (50000 UNIT) CAPS capsule #4 with 0 refills and will follow-up for routine testing of Vitamin D, at least 2-3 times per year to avoid over-replacement.   Other hyperlipidemia. Cardiovascular risk and specific lipid/LDL goals reviewed.  We discussed several lifestyle modifications today and Skarlet will continue to work on diet, exercise and weight loss efforts. Orders and follow up as documented in patient record.   Counseling Intensive lifestyle modifications are the first line treatment for this issue. . Dietary changes: Increase soluble fiber. Decrease simple carbohydrates. . Exercise changes: Moderate to vigorous-intensity aerobic activity 150 minutes per week if tolerated. . Lipid-lowering medications: see documented in medical record.  At risk for osteoporosis. Biatriz was given approximately 15 minutes of osteoporosis prevention counseling today. Crystal Madden is at risk for osteopenia and osteoporosis due to her Vitamin D deficiency. She was encouraged to take her Vitamin D and follow her higher calcium diet and increase strengthening exercise to help strengthen her bones and decrease her risk of osteopenia and osteoporosis.  Repetitive spaced learning was employed today to elicit superior memory formation and behavioral change.  Class 2 severe obesity with serious comorbidity and body mass index (BMI)  of 35.0 to 35.9 in adult, unspecified obesity type ([place]).  Crystal Madden is currently in the action stage of change. As such, her goal is to continue with weight loss efforts. She has agreed to the Category 3 Plan and will journal 450-600 calories and 40 grams of protein at supper.   Exercise goals: For substantial health benefits, adults should do at least 150 minutes (2 hours and 30 minutes) a week of moderate-intensity, or 75 minutes (1 hour  and 15 minutes) a week of vigorous-intensity aerobic physical activity, or an equivalent combination of moderate- and vigorous-intensity aerobic activity. Aerobic activity should be performed in episodes of at least 10 minutes, and preferably, it should be spread throughout the week.  Behavioral modification strategies: meal planning and cooking strategies and keeping healthy foods in the home.  Crystal Madden has agreed to follow-up with our clinic in 2 weeks. She was informed of the importance of frequent follow-up visits to maximize her success with intensive lifestyle modifications for her multiple health conditions.   Objective:   Blood pressure 127/76, pulse 99, temperature 98.7 F (37.1 C), temperature source Oral, height 5\' 6"  (1.676 m), weight 216 lb (98 kg), SpO2 98 %. Body mass index is 34.86 kg/m.  General: Cooperative, alert, well developed, in no acute distress. HEENT: Conjunctivae and lids unremarkable. Cardiovascular: Regular rhythm.  Lungs: Normal work of breathing. Neurologic: No focal deficits.   Lab Results  Component Value Date   CREATININE 0.90 10/19/2019   BUN 14 10/19/2019   NA 137 10/19/2019   K 3.9 10/19/2019   CL 103 10/19/2019   CO2 23 10/19/2019   Lab Results  Component Value Date   ALT 36 10/19/2019   AST 34 10/19/2019   ALKPHOS 44 10/19/2019   BILITOT 1.2 10/19/2019   Lab Results  Component Value Date   HGBA1C 5.4 10/04/2019   HGBA1C 5.3 05/31/2019   HGBA1C 5.0 12/14/2017   Lab Results  Component Value Date   INSULIN 26.4 (H) 10/04/2019   Lab Results  Component Value Date   TSH 3.290 10/04/2019   Lab Results  Component Value Date   CHOL 209 (H) 10/04/2019   HDL 46 10/04/2019   LDLCALC 130 (H) 10/04/2019   LDLDIRECT 141.0 05/31/2019   TRIG 184 (H) 10/04/2019   CHOLHDL 5 05/31/2019   Lab Results  Component Value Date   WBC 8.3 10/19/2019   HGB 14.3 10/19/2019   HCT 41.9 10/19/2019   MCV 95.2 10/19/2019   PLT 215 10/19/2019   No  results found for: IRON, TIBC, FERRITIN  Attestation Statements:   Reviewed by clinician on day of visit: allergies, medications, problem list, medical history, surgical history, family history, social history, and previous encounter notes.  IMichaelene Song, am acting as transcriptionist for Abby Potash, PA-C   I have reviewed the above documentation for accuracy and completeness, and I agree with the above. Abby Potash, PA-C

## 2019-12-12 ENCOUNTER — Other Ambulatory Visit: Payer: Self-pay | Admitting: Physician Assistant

## 2019-12-12 MED FILL — VALSARTAN-HCTZ 80-12.5 MG T: 80-12.5 | 30 days supply | Qty: 30 | Fill #0

## 2019-12-16 MED FILL — VYVANSE 20 MG CAPSULE: 20 | 30 days supply | Qty: 30 | Fill #0

## 2019-12-21 ENCOUNTER — Ambulatory Visit (INDEPENDENT_AMBULATORY_CARE_PROVIDER_SITE_OTHER): Payer: No Typology Code available for payment source | Admitting: Physician Assistant

## 2019-12-28 ENCOUNTER — Other Ambulatory Visit: Payer: Self-pay

## 2019-12-28 ENCOUNTER — Encounter (INDEPENDENT_AMBULATORY_CARE_PROVIDER_SITE_OTHER): Payer: Self-pay | Admitting: Family Medicine

## 2019-12-28 ENCOUNTER — Ambulatory Visit (INDEPENDENT_AMBULATORY_CARE_PROVIDER_SITE_OTHER): Payer: No Typology Code available for payment source | Admitting: Family Medicine

## 2019-12-28 VITALS — BP 129/79 | HR 81 | Temp 98.2°F | Ht 66.0 in | Wt 218.0 lb

## 2019-12-28 DIAGNOSIS — E559 Vitamin D deficiency, unspecified: Secondary | ICD-10-CM

## 2019-12-28 DIAGNOSIS — Z6835 Body mass index (BMI) 35.0-35.9, adult: Secondary | ICD-10-CM

## 2019-12-28 DIAGNOSIS — E8881 Metabolic syndrome: Secondary | ICD-10-CM

## 2019-12-28 MED ORDER — METFORMIN HCL 500 MG PO TABS: 500.0000 mg | ORAL_TABLET | Freq: Every morning | ORAL | 0 refills | Status: DC

## 2019-12-28 MED ORDER — VITAMIN D (ERGOCALCIFEROL) 1.25 MG (50000 UNIT) PO CAPS: 50000.0000 [IU] | ORAL_CAPSULE | ORAL | 0 refills | Status: DC

## 2019-12-28 MED FILL — metFORMIN HCL 500 MG TABS: 500 | 30 days supply | Qty: 30 | Fill #0

## 2019-12-29 ENCOUNTER — Encounter (INDEPENDENT_AMBULATORY_CARE_PROVIDER_SITE_OTHER): Payer: Self-pay | Admitting: Family Medicine

## 2019-12-29 DIAGNOSIS — E88819 Insulin resistance, unspecified: Secondary | ICD-10-CM | POA: Insufficient documentation

## 2019-12-29 DIAGNOSIS — E8881 Metabolic syndrome: Secondary | ICD-10-CM | POA: Insufficient documentation

## 2019-12-29 MED FILL — VIT D2 1.25 MG (50,000 UNIT: 1.25 MG | 28 days supply | Qty: 4 | Fill #0

## 2019-12-29 NOTE — Progress Notes (Signed)
Chief Complaint:   OBESITY Crystal Madden is here to discuss her progress with her obesity treatment plan along with follow-up of her obesity related diagnoses. Crystal Madden is on the Category 3 Plan and keeping a food journal and adhering to recommended goals of 450-600 calories and 40 grams of protein at supper daily and states she is following her eating plan approximately 25% of the time. Crystal Madden states she is doing 0 minutes 0 times per week.  Today's visit was #: 6 Starting weight: 230 lbs Starting date: 10/04/2019 Today's weight: 218 lbs Today's date: 12/28/2019 Total lbs lost to date: 12 Total lbs lost since last in-office visit: 0  Interim History: Crystal Madden admits to being off the plan for a few weeks. She has not had appropriate groceries and has eaten out some. She is trying to make good choices. She has 3 sons in baseball, works full-time and is very busy. However, her mother and husband do the cooking at home. She denies excessive hunger.  Subjective:   1. Insulin resistance Tula has a diagnosis of insulin resistance based on her elevated fasting insulin level >5. She is on metformin 500 mg q AM, and she denies polyphagia. She continues to work on diet and exercise to decrease her risk of diabetes.  Lab Results  Component Value Date   INSULIN 26.4 (H) 10/04/2019   Lab Results  Component Value Date   HGBA1C 5.4 10/04/2019   2. Vitamin D deficiency Crystal Madden's last Vit D level was low at 28.5. She is on prescription Vit D.  Assessment/Plan:   1. Insulin resistance Crystal Madden will continue to work on weight loss, exercise, and decreasing simple carbohydrates to help decrease the risk of diabetes. We will refill metformin for 1 month. Crystal Madden agreed to follow-up with Crystal Madden as directed to closely monitor her progress.  - metFORMIN (GLUCOPHAGE) 500 MG tablet; Take 1 tablet (500 mg total) by mouth every morning.  Dispense: 30 tablet; Refill: 0  2. Vitamin D deficiency Low  Vitamin D level contributes to fatigue and are associated with obesity, breast, and colon cancer. We will refill prescription Vitamin D for 1 month. Crystal Madden will follow-up for routine testing of Vitamin D, at least 2-3 times per year to avoid over-replacement.  - Vitamin D, Ergocalciferol, (DRISDOL) 1.25 MG (50000 UNIT) CAPS capsule; Take 1 capsule (50,000 Units total) by mouth every 7 (seven) days.  Dispense: 4 capsule; Refill: 0  3. Class 2 severe obesity with serious comorbidity and body mass index (BMI) of 35.0 to 35.9 in adult, unspecified obesity type Crystal Madden) Crystal Madden is currently in the action stage of change. As such, her goal is to continue with weight loss efforts. She has agreed to the Category 3 Plan and keeping a food journal and adhering to recommended goals of 450-600 calories and 40 grams of protein at supper daily.   Crystal Madden will try to meal plan more.  Exercise goals: Crystal Madden is to consider adding in exercise.   Behavioral modification strategies: increasing lean protein intake, decreasing simple carbohydrates, decreasing eating out, meal planning and cooking strategies, keeping healthy foods in the home and planning for success.  Crystal Madden has agreed to follow-up with our clinic in 2 weeks. She was informed of the importance of frequent follow-up visits to maximize her success with intensive lifestyle modifications for her multiple health conditions.   Objective:   Blood pressure 129/79, pulse 81, temperature 98.2 F (36.8 C), temperature source Oral, height 5\' 6"  (1.676 m), weight 218 lb (98.9  kg), SpO2 98 %. Body mass index is 35.19 kg/m.  General: Cooperative, alert, well developed, in no acute distress. HEENT: Conjunctivae and lids unremarkable. Cardiovascular: Regular rhythm.  Lungs: Normal work of breathing. Neurologic: No focal deficits.   Lab Results  Component Value Date   CREATININE 0.90 10/19/2019   BUN 14 10/19/2019   NA 137 10/19/2019   K 3.9  10/19/2019   CL 103 10/19/2019   CO2 23 10/19/2019   Lab Results  Component Value Date   ALT 36 10/19/2019   AST 34 10/19/2019   ALKPHOS 44 10/19/2019   BILITOT 1.2 10/19/2019   Lab Results  Component Value Date   HGBA1C 5.4 10/04/2019   HGBA1C 5.3 05/31/2019   HGBA1C 5.0 12/14/2017   Lab Results  Component Value Date   INSULIN 26.4 (H) 10/04/2019   Lab Results  Component Value Date   TSH 3.290 10/04/2019   Lab Results  Component Value Date   CHOL 209 (H) 10/04/2019   HDL 46 10/04/2019   LDLCALC 130 (H) 10/04/2019   LDLDIRECT 141.0 05/31/2019   TRIG 184 (H) 10/04/2019   CHOLHDL 5 05/31/2019   Lab Results  Component Value Date   WBC 8.3 10/19/2019   HGB 14.3 10/19/2019   HCT 41.9 10/19/2019   MCV 95.2 10/19/2019   PLT 215 10/19/2019   No results found for: IRON, TIBC, FERRITIN  Attestation Statements:   Reviewed by clinician on day of visit: allergies, medications, problem list, medical history, surgical history, family history, social history, and previous encounter notes.   Wilhemena Durie, am acting as Location manager for [employer], FNP-C.  I have reviewed the above documentation for accuracy and completeness, and I agree with the above. -  Georgianne Fick, FNP

## 2020-01-03 MED FILL — VYVANSE 40 MG CHEW: 40 | 30 days supply | Qty: 30 | Fill #0

## 2020-01-05 ENCOUNTER — Ambulatory Visit (INDEPENDENT_AMBULATORY_CARE_PROVIDER_SITE_OTHER): Payer: No Typology Code available for payment source | Admitting: Family Medicine

## 2020-01-05 ENCOUNTER — Encounter: Payer: Self-pay | Admitting: Family Medicine

## 2020-01-05 ENCOUNTER — Other Ambulatory Visit: Payer: Self-pay

## 2020-01-05 VITALS — BP 120/80 | HR 89 | Ht 66.0 in | Wt 221.6 lb

## 2020-01-05 DIAGNOSIS — M5412 Radiculopathy, cervical region: Secondary | ICD-10-CM | POA: Diagnosis not present

## 2020-01-05 MED ORDER — PREGABALIN 75 MG PO CAPS: 75.0000 mg | ORAL_CAPSULE | Freq: Two times a day (BID) | ORAL | 3 refills | Status: DC

## 2020-01-05 MED ORDER — PREDNISONE 50 MG PO TABS: 50.0000 mg | ORAL_TABLET | Freq: Every day | ORAL | 0 refills | Status: DC

## 2020-01-05 MED FILL — PREGABALIN 75 MG CAPS: 75 | 30 days supply | Qty: 60 | Fill #0

## 2020-01-05 MED FILL — predniSONE 50 MG TABS: 50 | 5 days supply | Qty: 5 | Fill #0

## 2020-01-05 NOTE — Patient Instructions (Signed)
Thank you for coming in today. Take the prednisone and try lyrica up to 2x daily for nerve pain.  Could increase the dose if needed.  If not doing well enough next step is likely epidural steroid injection.  Let me know.   Epidural Steroid Injection Patient Information  Description: The epidural space surrounds the nerves as they exit the spinal cord.  In some patients, the nerves can be compressed and inflamed by a bulging disc or a tight spinal canal (spinal stenosis).  By injecting steroids into the epidural space, we can bring irritated nerves into direct contact with a potentially helpful medication.  These steroids act directly on the irritated nerves and can reduce swelling and inflammation which often leads to decreased pain.  Epidural steroids may be injected anywhere along the spine and from the neck to the low back depending upon the location of your pain.   After numbing the skin with local anesthetic (like Novocaine), a small needle is passed into the epidural space slowly.  You may experience a sensation of pressure while this is being done.  The entire block usually last less than 10 minutes.  Conditions which may be treated by epidural steroids:   Low back and leg pain  Neck and arm pain  Spinal stenosis  Post-laminectomy syndrome  Herpes zoster (shingles) pain  Pain from compression fractures  Preparation for the injection:  1. Do not eat any solid food or dairy products within 8 hours of your appointment.  2. You may drink clear liquids up to 3 hours before appointment.  Clear liquids include water, black coffee, juice or soda.  No milk or cream please. 3. You may take your regular medication, including pain medications, with a sip of water before your appointment  Diabetics should hold regular insulin (if taken separately) and take 1/2 normal NPH dos the morning of the procedure.  Carry some sugar containing items with you to your appointment. 4. A driver must  accompany you and be prepared to drive you home after your procedure.  5. Bring all your current medications with your. 6. An IV may be inserted and sedation may be given at the discretion of the physician.   7. A blood pressure cuff, EKG and other monitors will often be applied during the procedure.  Some patients may need to have extra oxygen administered for a short period. 8. You will be asked to provide medical information, including your allergies, prior to the procedure.  We must know immediately if you are taking blood thinners (like Coumadin/Warfarin)  Or if you are allergic to IV iodine contrast (dye). We must know if you could possible be pregnant.  Possible side-effects:  Bleeding from needle site  Infection (rare, may require surgery)  Nerve injury (rare)  Numbness & tingling (temporary)  Difficulty urinating (rare, temporary)  Spinal headache ( a headache worse with upright posture)  Light -headedness (temporary)  Pain at injection site (several days)  Decreased blood pressure (temporary)  Weakness in arm/leg (temporary)  Pressure sensation in back/neck (temporary)  Call if you experience:  Fever/chills associated with headache or increased back/neck pain.  Headache worsened by an upright position.  New onset weakness or numbness of an extremity below the injection site  Hives or difficulty breathing (go to the emergency room)  Inflammation or drainage at the infection site  Severe back/neck pain  Any new symptoms which are concerning to you  Please note:  Although the local anesthetic injected can often make  your back or neck feel good for several hours after the injection, the pain will likely return.  It takes 3-7 days for steroids to work in the epidural space.  You may not notice any pain relief for at least that one week.  If effective, we will often do a series of three injections spaced 3-6 weeks apart to maximally decrease your pain.  After the  initial series, we generally will wait several months before considering a repeat injection of the same type.

## 2020-01-05 NOTE — Progress Notes (Signed)
I, Wendy Poet, LAT, ATC, am serving as scribe for Dr. Lynne Leader.  Crystal Madden is a 44 y.o. female who presents to [place] at [facility] today for f/u of LBP, neck and shoulder pain.  She was last seen by Dr. Georgina Snell on 11/02/19 and was referred for a lumbar facet injection.  She had B L3-4 facet injections on 11/11/19.  Since her last visit, pt reports that the injections helped w/ her back and hip/leg pain.  She states that now that her back is feeling better, she has been noticing pain in her R  lateral neck w/ sharp, burning pain in her R anterior shoulder/clavicular area.  She notes she will have occasional pain into her R proximal upper arm.  She reports intermittent numbness/tingling but this is associated w/ her sleeping position.  Diagnostic imaging: L hip and L-spine XR- 08/25/19; c-spine and L-spine MRI- 10/29/19  Pertinent review of systems: No fevers or chills  Relevant historical information: Hypertension   Exam:  BP 120/80 (BP Location: Right Arm, Patient Position: Sitting, Cuff Size: Large)   Pulse 89   Ht 5\' 6"  (1.676 m)   Wt 221 lb 9.6 oz (100.5 kg)   SpO2 98%   BMI 35.77 kg/m  General: Well Developed, well nourished, and in no acute distress.   MSK: C-spine normal-appearing Nontender. Normal cervical motion. Mildly positive Spurling's test right side. Upper extremity strength reflexes and sensation are intact distally. Right shoulder normal-appearing nontender normal motion negative Hawkins Neer's empty can test. Negative Yergason's and speeds test. Negative clunk and relocation test. Negative crossover compression test.    Lab and Radiology Results  EXAM: MRI CERVICAL SPINE WITHOUT CONTRAST  TECHNIQUE: Multiplanar, multisequence MR imaging of the cervical spine was performed. No intravenous contrast was administered.  COMPARISON:  No pertinent prior studies available for comparison.  FINDINGS: Alignment: Straightening  of the expected cervical lordosis. No significant spondylolisthesis  Vertebrae: Vertebral body height is maintained. No marrow edema or suspicious osseous lesion. Tiny benign appearing cystic lesion versus annular fissure along the posterosuperior aspect of the C5 vertebral body.  Cord: No spinal cord signal abnormality.  Posterior Fossa, vertebral arteries, paraspinal tissues: No abnormality identified within included portions of the posterior fossa. Flow voids preserved within the cervical vertebral arteries. Paraspinal soft tissues within normal limits.  Disc levels:  Mild-to-moderate disc degeneration throughout the cervical spine.  Congenitally narrow cervical spinal canal.  C2-C3: No disc herniation. No significant canal or foraminal stenosis.  C3-C4: Minimal disc bulge right-sided disc osteophyte ridge/uncinate hypertrophy contributes to moderate/severe right neural foraminal narrowing. Left-sided uncinate hypertrophy contributes to mild left neural foraminal narrowing. Mild degenerative spinal canal stenosis.  C4-C5: Minimal disc bulge. Bilateral disc osteophyte ridge/uncinate hypertrophy. Mild degenerative spinal canal narrowing. Mild bilateral neural foraminal narrowing (greater on the left).  C5-C6: No significant disc herniation or spinal canal stenosis. Right greater than left uncinate hypertrophy. Mild right neural foraminal narrowing.  C6-C7: No disc herniation. No significant canal or foraminal stenosis.  C7-T1: No disc herniation. No significant canal or foraminal stenosis.  IMPRESSION: Cervical spondylosis, as outlined and superimposed upon a congenitally narrow cervical spinal canal.  No more than mild degenerative spinal canal narrowing at any level.  Multilevel neural foraminal narrowing as described and greatest on the right at C3-C4 where right-sided disc osteophyte ridge contributes to moderate/severe foraminal stenosis.  Correlate for right C4 radiculopathy.   Electronically Signed   By: Kellie Simmering DO   On: 10/29/2019  08:58  I, Lynne Leader, personally (independently) visualized and performed the interpretation of the images attached in this note.     Assessment and Plan: 44 y.o. female with right lateral neck to anterior shoulder pain.  Distribution of pain is consistent with C4-C5 radiculopathy.  She has neuro impingement on recent cervical spine MRI that could cause C4 radiculopathy.  This likely does explain her pain.  Plan for brief trial of prednisone and Lyrica.  She notes gabapentin has not been helpful.  If not better would proceed with epidural steroid injection however the highest level radiology typically can perform is C6-7 which may be a bit too low to be truly effective.  Patient will see me an update and I can prescribe or order the epidural steroid injection in the interim without her coming back.   PDMP not reviewed this encounter. No orders of the defined types were placed in this encounter.  Meds ordered this encounter  Medications  . predniSONE (DELTASONE) 50 MG tablet    Sig: Take 1 tablet (50 mg total) by mouth daily.    Dispense:  5 tablet    Refill:  0  . pregabalin (LYRICA) 75 MG capsule    Sig: Take 1 capsule (75 mg total) by mouth 2 (two) times daily.    Dispense:  60 capsule    Refill:  3     Discussed warning signs or symptoms. Please see discharge instructions. Patient expresses understanding.   The above documentation has been reviewed and is accurate and complete Lynne Leader, M.D.

## 2020-01-11 ENCOUNTER — Other Ambulatory Visit: Payer: Self-pay | Admitting: Physician Assistant

## 2020-01-11 MED ORDER — VALSARTAN-HYDROCHLOROTHIAZIDE 80-12.5 MG PO TABS: 1.0000 | ORAL_TABLET | Freq: Every day | ORAL | 0 refills | Status: DC

## 2020-01-11 MED FILL — VALSARTAN-HCTZ 80-12.5 MG T: 80-12.5 | 90 days supply | Qty: 90 | Fill #0

## 2020-01-18 ENCOUNTER — Ambulatory Visit (INDEPENDENT_AMBULATORY_CARE_PROVIDER_SITE_OTHER): Payer: No Typology Code available for payment source | Admitting: Family Medicine

## 2020-01-23 ENCOUNTER — Ambulatory Visit (INDEPENDENT_AMBULATORY_CARE_PROVIDER_SITE_OTHER): Payer: No Typology Code available for payment source | Admitting: Adult Health

## 2020-01-23 ENCOUNTER — Other Ambulatory Visit: Payer: Self-pay

## 2020-01-23 ENCOUNTER — Encounter (INDEPENDENT_AMBULATORY_CARE_PROVIDER_SITE_OTHER): Payer: Self-pay | Admitting: Adult Health

## 2020-01-23 VITALS — BP 112/80 | HR 105 | Temp 98.4°F | Ht 66.0 in | Wt 215.0 lb

## 2020-01-23 DIAGNOSIS — Z9189 Other specified personal risk factors, not elsewhere classified: Secondary | ICD-10-CM | POA: Diagnosis not present

## 2020-01-23 DIAGNOSIS — E559 Vitamin D deficiency, unspecified: Secondary | ICD-10-CM | POA: Diagnosis not present

## 2020-01-23 DIAGNOSIS — Z6834 Body mass index (BMI) 34.0-34.9, adult: Secondary | ICD-10-CM

## 2020-01-23 DIAGNOSIS — E669 Obesity, unspecified: Secondary | ICD-10-CM | POA: Diagnosis not present

## 2020-01-23 DIAGNOSIS — I10 Essential (primary) hypertension: Secondary | ICD-10-CM

## 2020-01-23 MED ORDER — VITAMIN D (ERGOCALCIFEROL) 1.25 MG (50000 UNIT) PO CAPS: 50000.0000 [IU] | ORAL_CAPSULE | ORAL | 0 refills | Status: DC

## 2020-01-23 MED FILL — VIT D2 1.25 MG (50,000 UNIT: 1.25 MG | 28 days supply | Qty: 4 | Fill #0

## 2020-01-24 NOTE — Progress Notes (Signed)
Chief Complaint:   OBESITY Crystal Madden is here to discuss her progress with her obesity treatment plan along with follow-up of her obesity related diagnoses. [name] is on the Category 3 Plan and states she is following her eating plan approximately 50% of the time. [name] states she is getting in steps.  Today's visit was #: 7 Starting weight: 230 lbs Starting date: 10/04/2019 Today's weight: 215 lbs Today's date: 01/23/2020 Total lbs lost to date: 15 lbs Total lbs lost since last in-office visit: 3 lbs  Interim History: Crystal Madden reports an increase in energy and a reduction in diffuse musculoskeletal aching.  She is able to follow breakfast and lunch very consistently and has been really focusing on adequate protein/vegetables for dinner.  She reports a decrease in her overall amount of stress.  She says her kids have all finished the school year and sport commitments have reduced.  Subjective:   1. Vitamin D deficiency [name]'s Vitamin D level was 28.5 on 10/04/2019. She is currently taking prescription vitamin D 50,000 IU each week. She denies nausea, vomiting or muscle weakness.  2. Essential hypertension Review: taking medications as instructed, no medication side effects noted, no chest pain on exertion, no dyspnea on exertion, no swelling of ankles.  Blood pressure is well-controlled today.  She is on valsartan-HCTZ 80-12.5 mg daily and is tolerating it well.  If able she would like to reduce or stop blood pressure medication.  BP Readings from Last 3 Encounters:  01/23/20 112/80  01/05/20 120/80  12/28/19 129/79   3. At risk for osteoporosis Crystal Madden is at higher risk of osteopenia and osteoporosis due to Vitamin D deficiency.   Assessment/Plan:   1. Vitamin D deficiency Low Vitamin D level contributes to fatigue and are associated with obesity, breast, and colon cancer. She agrees to continue to take prescription Vitamin D @50 ,000 IU every week and will follow-up  for routine testing of Vitamin D, at least 2-3 times per year to avoid over-replacement. - Vitamin D, Ergocalciferol, (DRISDOL) 1.25 MG (50000 UNIT) CAPS capsule; Take 1 capsule (50,000 Units total) by mouth every 7 (seven) days.  Dispense: 4 capsule; Refill: 0  2. Essential hypertension [name] is working on healthy weight loss and exercise to improve blood pressure control. We will watch for signs of hypotension as she continues her lifestyle modifications.  Will check labs at next office visit.  3. At risk for osteoporosis Crystal Madden was given approximately 15 minutes of osteoporosis prevention counseling today. Crystal Madden is at risk for osteopenia and osteoporosis due to her Vitamin D deficiency. She was encouraged to take her Vitamin D and follow her higher calcium diet and increase strengthening exercise to help strengthen her bones and decrease her risk of osteopenia and osteoporosis.  Repetitive spaced learning was employed today to elicit superior memory formation and behavioral change.  4. Class 1 obesity with serious comorbidity and body mass index (BMI) of 34.0 to 34.9 in adult, unspecified obesity type Crystal Madden is currently in the action stage of change. As such, her goal is to continue with weight loss efforts. She has agreed to the Category 3 Plan.   Exercise goals: As is.  Behavioral modification strategies: increasing lean protein intake, decreasing simple carbohydrates, increasing vegetables, meal planning and cooking strategies and celebration eating strategies.  Crystal Madden has agreed to follow-up with our clinic in 2-3 weeks, fasting. She was informed of the importance of frequent follow-up visits to maximize her success with intensive lifestyle modifications for her multiple health  conditions.   Objective:   Blood pressure 112/80, pulse (!) 105, temperature 98.4 F (36.9 C), temperature source Oral, height 5\' 6"  (1.676 m), weight 215 lb (97.5 kg), last menstrual period  01/20/2020, SpO2 98 %. Body mass index is 34.7 kg/m.  General: Cooperative, alert, well developed, in no acute distress. HEENT: Conjunctivae and lids unremarkable. Cardiovascular: Regular rhythm.  Lungs: Normal work of breathing. Neurologic: No focal deficits.   Lab Results  Component Value Date   CREATININE 0.90 10/19/2019   BUN 14 10/19/2019   NA 137 10/19/2019   K 3.9 10/19/2019   CL 103 10/19/2019   CO2 23 10/19/2019   Lab Results  Component Value Date   ALT 36 10/19/2019   AST 34 10/19/2019   ALKPHOS 44 10/19/2019   BILITOT 1.2 10/19/2019   Lab Results  Component Value Date   HGBA1C 5.4 10/04/2019   HGBA1C 5.3 05/31/2019   HGBA1C 5.0 12/14/2017   Lab Results  Component Value Date   INSULIN 26.4 (H) 10/04/2019   Lab Results  Component Value Date   TSH 3.290 10/04/2019   Lab Results  Component Value Date   CHOL 209 (H) 10/04/2019   HDL 46 10/04/2019   LDLCALC 130 (H) 10/04/2019   LDLDIRECT 141.0 05/31/2019   TRIG 184 (H) 10/04/2019   CHOLHDL 5 05/31/2019   Lab Results  Component Value Date   WBC 8.3 10/19/2019   HGB 14.3 10/19/2019   HCT 41.9 10/19/2019   MCV 95.2 10/19/2019   PLT 215 10/19/2019   Attestation Statements:   Reviewed by clinician on day of visit: allergies, medications, problem list, medical history, surgical history, family history, social history, and previous encounter notes.  I, Water quality scientist, CMA, am acting as Location manager for Mina Marble, NP.  I have reviewed the above documentation for accuracy and completeness, and I agree with the above. -  Esaw Grandchild, NP

## 2020-01-25 ENCOUNTER — Encounter: Payer: Self-pay | Admitting: Family Medicine

## 2020-01-25 DIAGNOSIS — M5412 Radiculopathy, cervical region: Secondary | ICD-10-CM

## 2020-01-25 DIAGNOSIS — G8929 Other chronic pain: Secondary | ICD-10-CM

## 2020-01-26 ENCOUNTER — Other Ambulatory Visit: Payer: Self-pay | Admitting: Family Medicine

## 2020-01-26 DIAGNOSIS — G8929 Other chronic pain: Secondary | ICD-10-CM

## 2020-01-30 NOTE — Addendum Note (Signed)
Addended by: Gregor Hams on: 01/30/2020 01:04 PM   Modules accepted: Orders

## 2020-02-02 MED FILL — VYVANSE 40 MG CHEW: 40 | 30 days supply | Qty: 30 | Fill #0

## 2020-02-08 ENCOUNTER — Other Ambulatory Visit: Payer: Self-pay | Admitting: Family Medicine

## 2020-02-08 DIAGNOSIS — G8929 Other chronic pain: Secondary | ICD-10-CM

## 2020-02-08 DIAGNOSIS — M545 Low back pain, unspecified: Secondary | ICD-10-CM

## 2020-02-08 MED FILL — NORLYDA 0.35 MG TABS: 0.35 | 84 days supply | Qty: 84 | Fill #3

## 2020-02-09 ENCOUNTER — Ambulatory Visit
Admission: RE | Admit: 2020-02-09 | Discharge: 2020-02-09 | Disposition: A | Discharge status: Home / Self Care | Payer: No Typology Code available for payment source | Source: Ambulatory Visit | Attending: Family Medicine | Admitting: Family Medicine

## 2020-02-09 ENCOUNTER — Other Ambulatory Visit: Payer: Self-pay | Admitting: Family Medicine

## 2020-02-09 ENCOUNTER — Encounter (INDEPENDENT_AMBULATORY_CARE_PROVIDER_SITE_OTHER): Payer: Self-pay

## 2020-02-09 ENCOUNTER — Other Ambulatory Visit: Payer: Self-pay

## 2020-02-09 DIAGNOSIS — M545 Low back pain, unspecified: Secondary | ICD-10-CM

## 2020-02-09 DIAGNOSIS — G8929 Other chronic pain: Secondary | ICD-10-CM

## 2020-02-09 IMAGING — XA DG FACET JT INJ L OR S SPINE 2ND LEVEL *R*
4 series · 4 of 4 positions shown · non-contrast
Comparison: none

Addendum:
CLINICAL DATA: Facet mediated low back pain. Good relief from prior
bilateral L3-L4 facet injections. Medial branch blocks in
anticipation of RFA requested.
TECHNIQUE: The procedure, risks, benefits, and alternatives were explained to
the patient. Questions regarding the procedure were encouraged and
answered. The patient understands and consents to the procedure.

[Series 1: ortho adipose · 1 of 1 slices shown (1 of 4)]
[im 1/1]
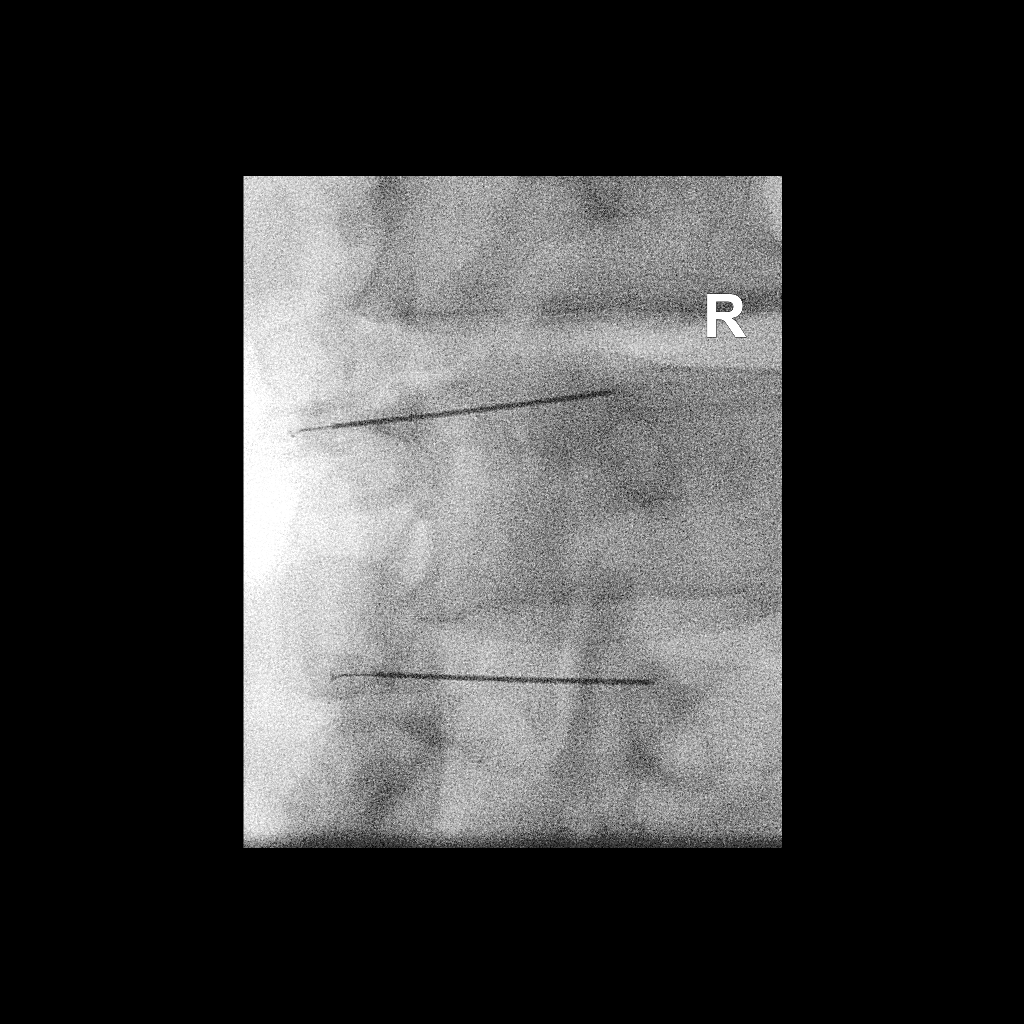

[Series 2: ortho adipose · 1 of 1 slices shown (2 of 4)]
[im 1/1]
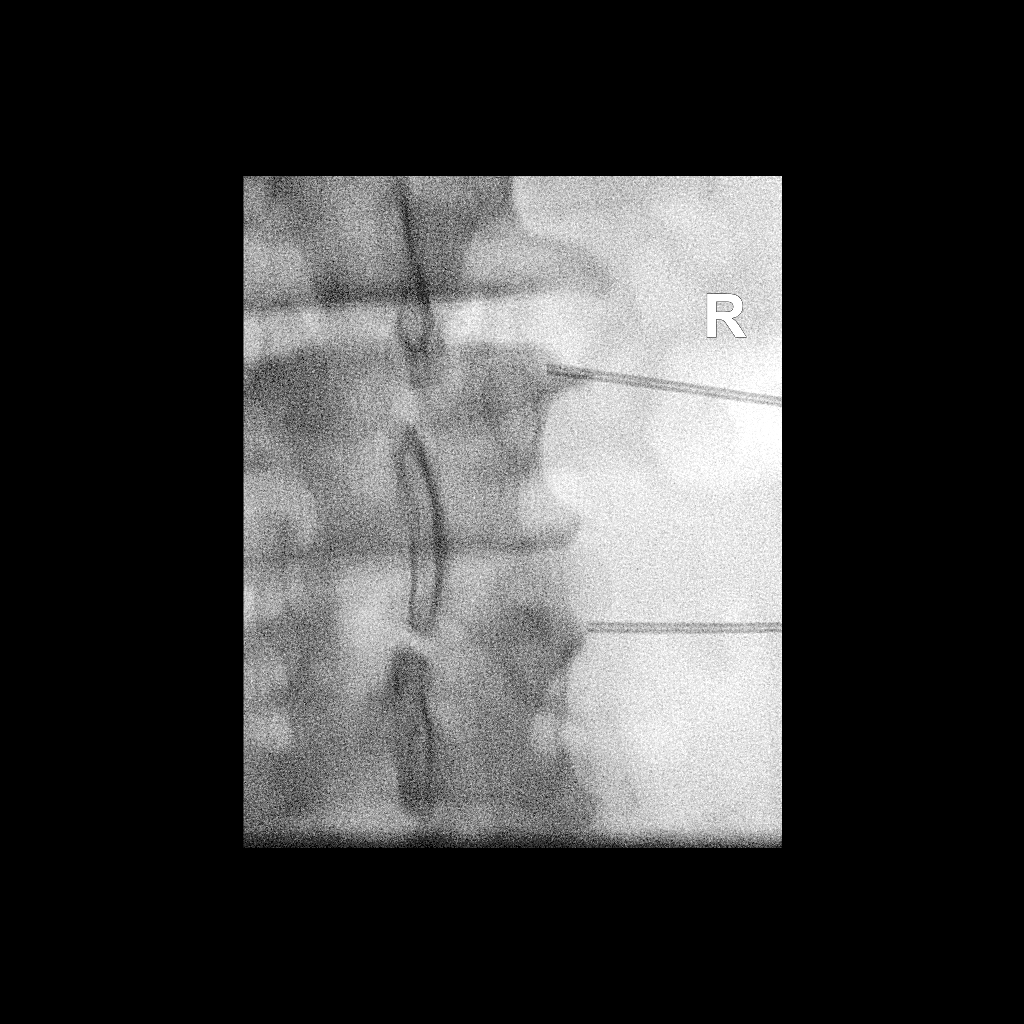

[Series 3: ortho adipose · 1 of 1 slices shown (3 of 4)]
[im 1/1]
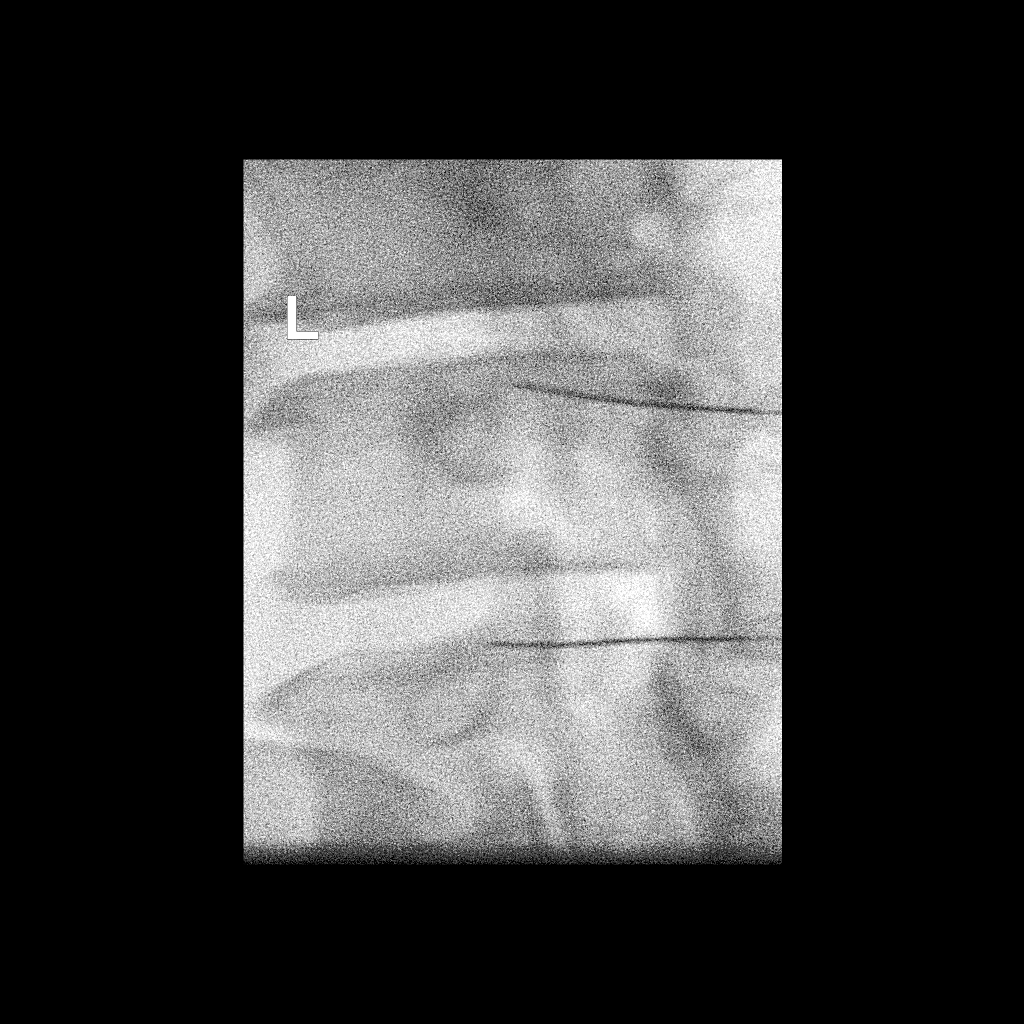

[Series 4: ortho adipose · 1 of 1 slices shown (4 of 4)]
[im 1/1]
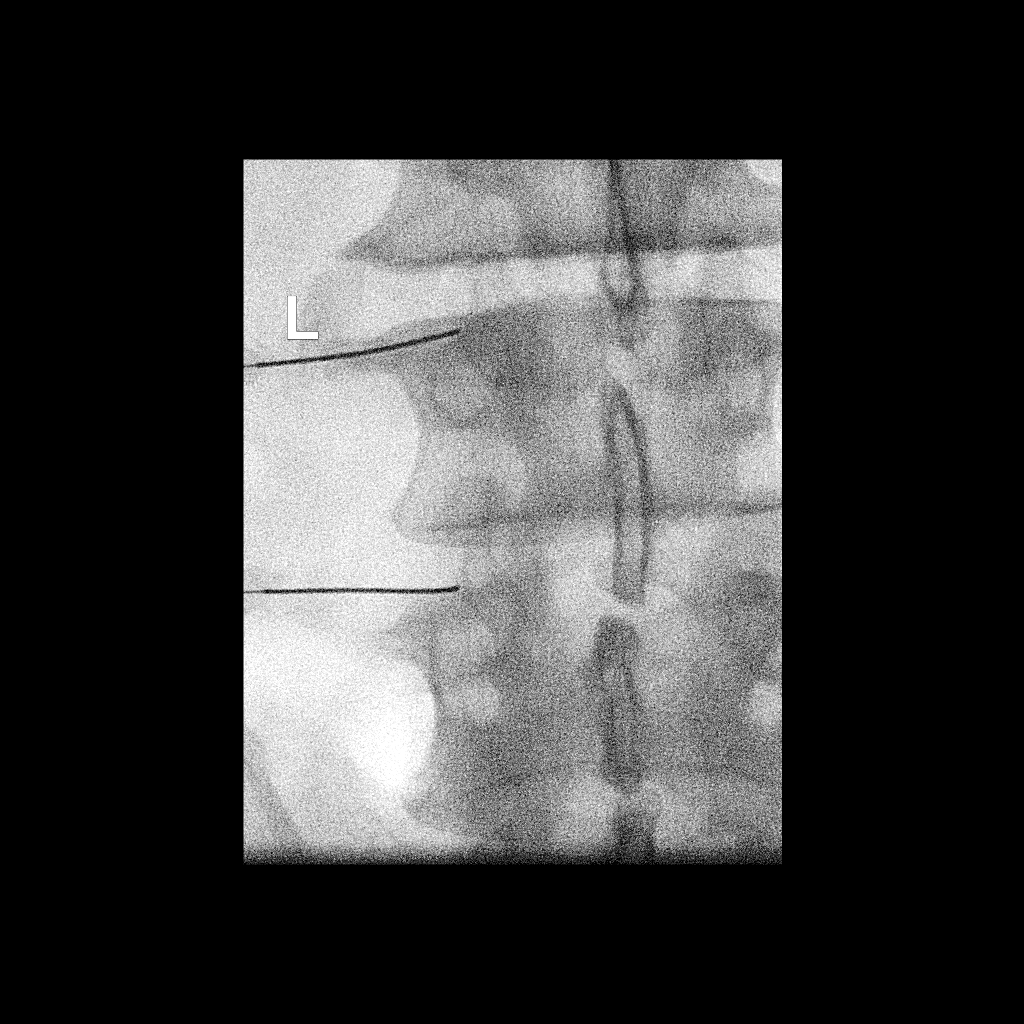

[4 of 4 positions shown; findings below may reference images not displayed]

EXAM:
FLUOROSCOPICALLY GUIDED BILATERAL L2 AND L3 MEDIAL BRANCH BLOCKS, IN
PREPARATION FOR BILATERAL L3-L4 FACET RADIOFREQUENCY ABLATION

FLUOROSCOPY TIME:  Radiation Exposure Index (as provided by the
fluoroscopic device): 13.0 mGy

Fluoroscopy Time:  1 minute, 10 seconds

Number of Acquired Images:  0
An appropriate skin entry site was determined fluoroscopically and
marked. Site prepped with betadine, draped in usual sterile fashion,
and infiltrated locally with 1% lidocaine.

RIGHT L2 MEDIAL BRANCH BLOCK: A posterior oblique approach was taken
to the junction of the superior articular process and transverse
process on the right at L3 using a 3.5 inch 22 gauge spinal needle,
to lie along the course of the right L2 medial branch nerve. 0.5 mL
of 0.5% bupivacaine were injected.

RIGHT L3 MEDIAL BRANCH BLOCK: A posterior oblique approach was taken
to the junction of the superior articular process and transverse
process on the right at L4 using a 3.5 inch 22 gauge spinal needle,
to lie along the course of the right L3 medial branch nerve. 0.5 mL
of 0.5% bupivacaine were injected.

LEFT L2 MEDIAL BRANCH BLOCK: A posterior oblique approach was taken
to the junction of the superior articular process and transverse
process on the left at L3 using a 3.5 inch 22 gauge spinal needle,
to lie along the course of the left L2 medial branch nerve. 0.5 mL
of 0.5% bupivacaine were injected.

LEFT L3 MEDIAL BRANCH BLOCK: A posterior oblique approach was taken
to the junction of the superior articular process and transverse
process on the left at L4 using a 3.5 inch 22 gauge spinal needle,
to lie along the course of the left L3 medial branch nerve. 0.5 mL
of 0.5% bupivacaine were injected.

The procedure was well-tolerated.
IMPRESSION: 1. Technically successful medial branch blocks in preparation for
bilateral L3-L4 facet radiofrequency ablation.

I will call the patient later today to assess for durable and
sustained pain relief. An addendum will be placed at that time.

ADDENDUM:
I called the patient at 4 p.m., approximately 7 hours after the
procedure. She has experienced significant improvement in her facet
mediated pain since the procedure. She is therefore a good candidate
for radiofrequency ablation and will be scheduled at her earliest
convenience.

*** End of Addendum ***
EXAM:
FLUOROSCOPICALLY GUIDED BILATERAL L2 AND L3 MEDIAL BRANCH BLOCKS, IN
PREPARATION FOR BILATERAL L3-L4 FACET RADIOFREQUENCY ABLATION

FLUOROSCOPY TIME:  Radiation Exposure Index (as provided by the
fluoroscopic device): 13.0 mGy

Fluoroscopy Time:  1 minute, 10 seconds

Number of Acquired Images:  0
An appropriate skin entry site was determined fluoroscopically and
marked. Site prepped with betadine, draped in usual sterile fashion,
and infiltrated locally with 1% lidocaine.

RIGHT L2 MEDIAL BRANCH BLOCK: A posterior oblique approach was taken
to the junction of the superior articular process and transverse
process on the right at L3 using a 3.5 inch 22 gauge spinal needle,
to lie along the course of the right L2 medial branch nerve. 0.5 mL
of 0.5% bupivacaine were injected.

RIGHT L3 MEDIAL BRANCH BLOCK: A posterior oblique approach was taken
to the junction of the superior articular process and transverse
process on the right at L4 using a 3.5 inch 22 gauge spinal needle,
to lie along the course of the right L3 medial branch nerve. 0.5 mL
of 0.5% bupivacaine were injected.

LEFT L2 MEDIAL BRANCH BLOCK: A posterior oblique approach was taken
to the junction of the superior articular process and transverse
process on the left at L3 using a 3.5 inch 22 gauge spinal needle,
to lie along the course of the left L2 medial branch nerve. 0.5 mL
of 0.5% bupivacaine were injected.

LEFT L3 MEDIAL BRANCH BLOCK: A posterior oblique approach was taken
to the junction of the superior articular process and transverse
process on the left at L4 using a 3.5 inch 22 gauge spinal needle,
to lie along the course of the left L3 medial branch nerve. 0.5 mL
of 0.5% bupivacaine were injected.

The procedure was well-tolerated.
IMPRESSION: 1. Technically successful medial branch blocks in preparation for
bilateral L3-L4 facet radiofrequency ablation.

I will call the patient later today to assess for durable and
sustained pain relief. An addendum will be placed at that time.

## 2020-02-09 IMAGING — XA DG FACET JT INJ L OR S SPINE SECOND LEVEL UNILATERAL
4 series · 4 of 4 positions shown · non-contrast
Comparison: none

Addendum:
CLINICAL DATA: Facet mediated low back pain. Good relief from prior
bilateral L3-L4 facet injections. Medial branch blocks in
anticipation of RFA requested.
TECHNIQUE: The procedure, risks, benefits, and alternatives were explained to
the patient. Questions regarding the procedure were encouraged and
answered. The patient understands and consents to the procedure.

[Series 1: ortho adipose · 1 of 1 slices shown (1 of 4)]
[im 1/1]
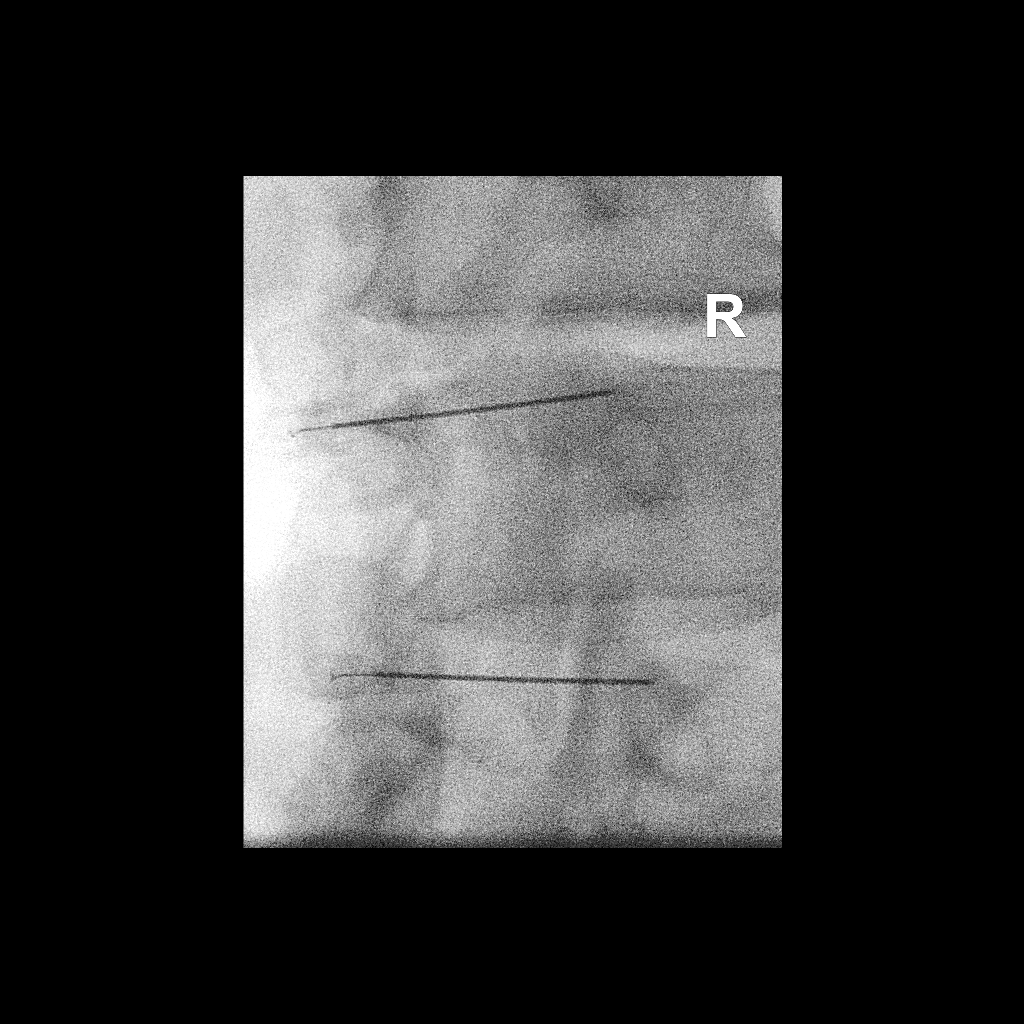

[Series 2: ortho adipose · 1 of 1 slices shown (2 of 4)]
[im 1/1]
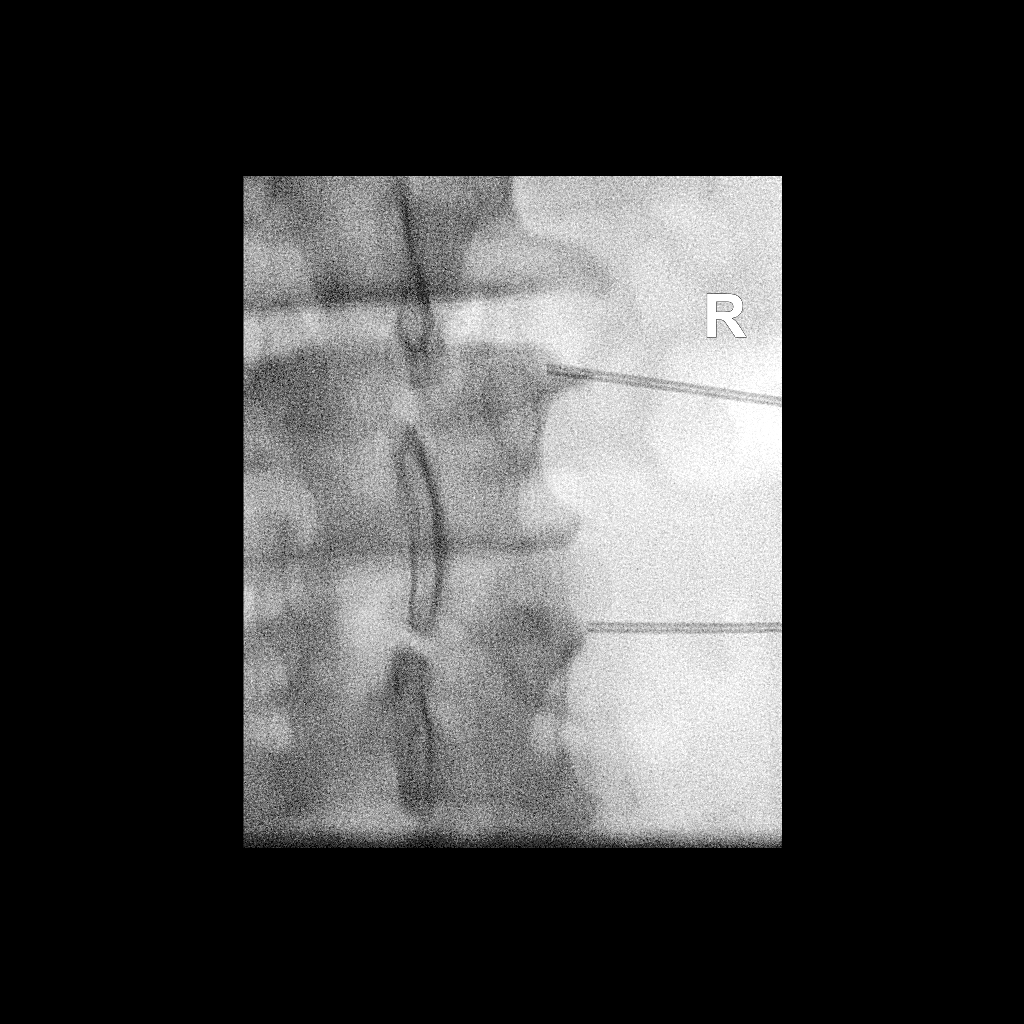

[Series 3: ortho adipose · 1 of 1 slices shown (3 of 4)]
[im 1/1]
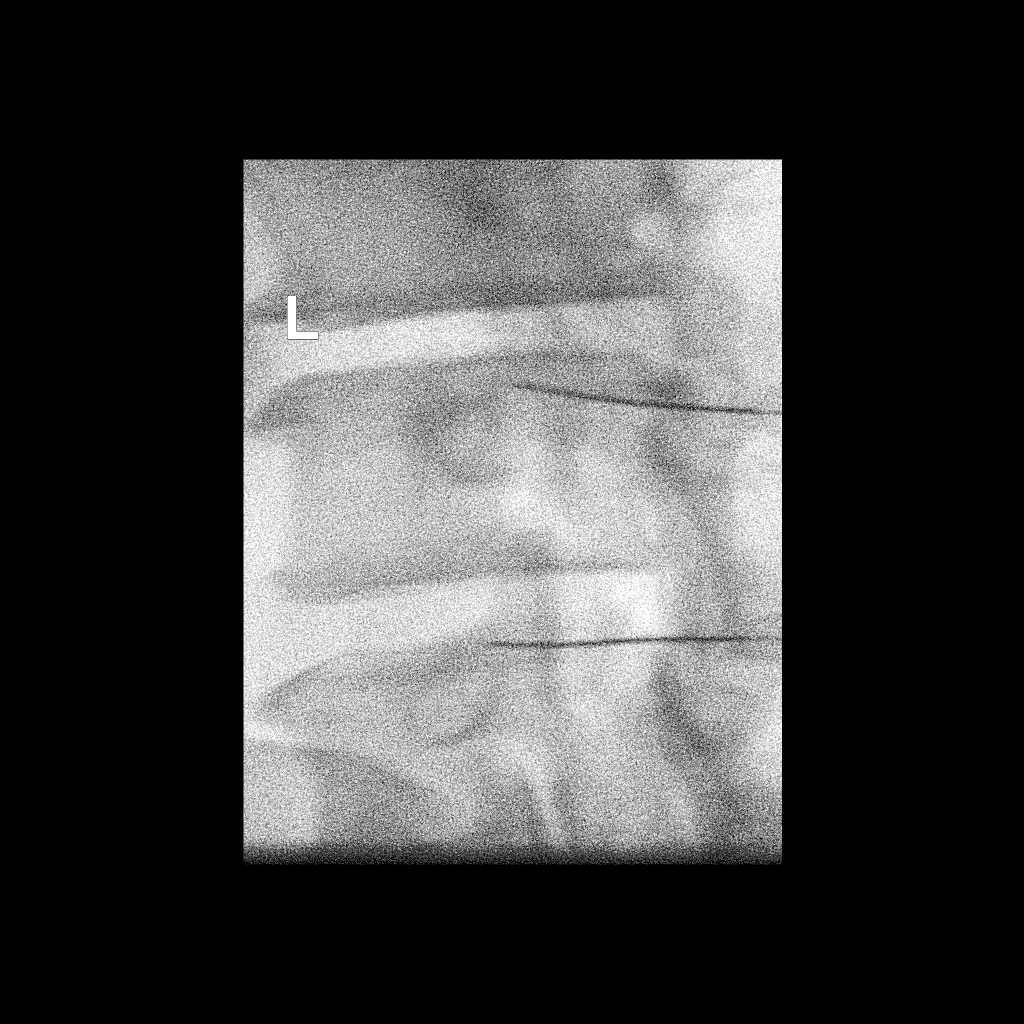

[Series 4: ortho adipose · 1 of 1 slices shown (4 of 4)]
[im 1/1]
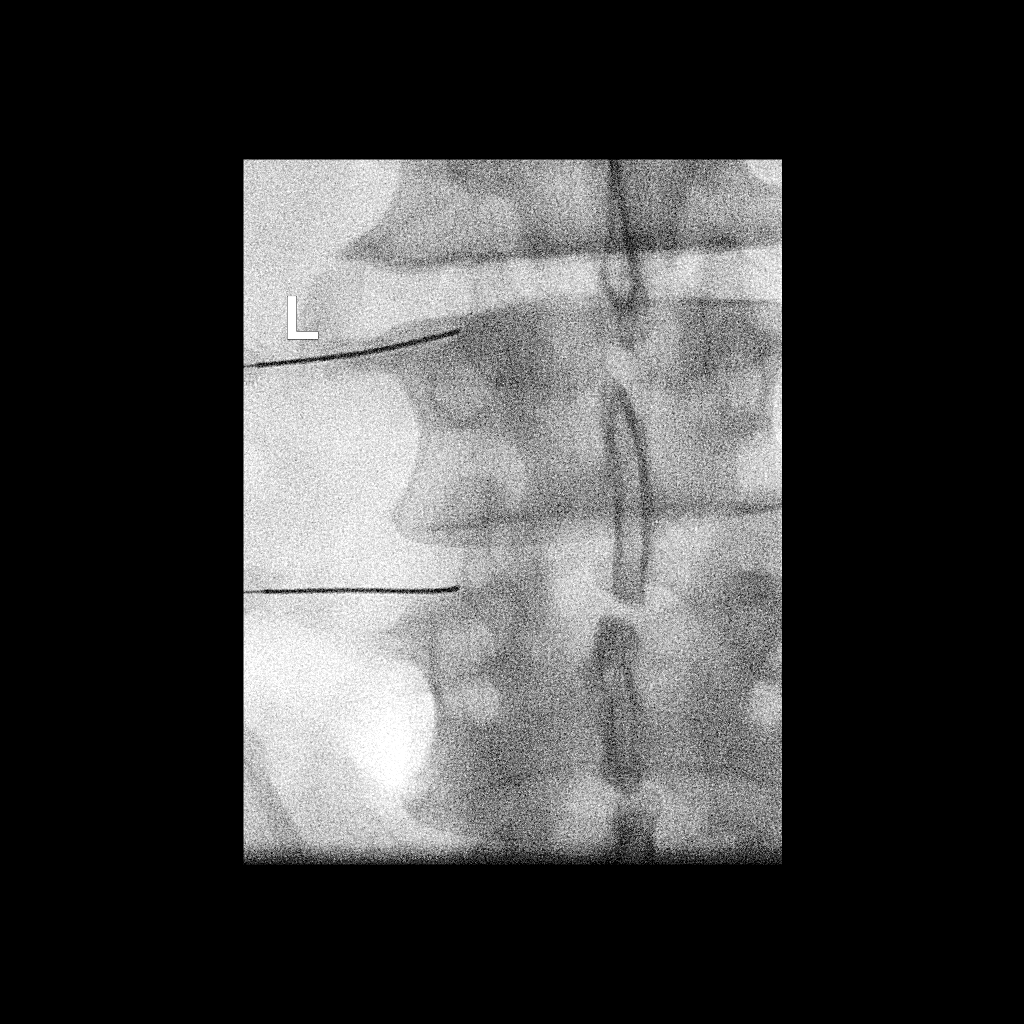

[4 of 4 positions shown; findings below may reference images not displayed]

EXAM:
FLUOROSCOPICALLY GUIDED BILATERAL L2 AND L3 MEDIAL BRANCH BLOCKS, IN
PREPARATION FOR BILATERAL L3-L4 FACET RADIOFREQUENCY ABLATION

FLUOROSCOPY TIME:  Radiation Exposure Index (as provided by the
fluoroscopic device): 13.0 mGy

Fluoroscopy Time:  1 minute, 10 seconds

Number of Acquired Images:  0
An appropriate skin entry site was determined fluoroscopically and
marked. Site prepped with betadine, draped in usual sterile fashion,
and infiltrated locally with 1% lidocaine.

RIGHT L2 MEDIAL BRANCH BLOCK: A posterior oblique approach was taken
to the junction of the superior articular process and transverse
process on the right at L3 using a 3.5 inch 22 gauge spinal needle,
to lie along the course of the right L2 medial branch nerve. 0.5 mL
of 0.5% bupivacaine were injected.

RIGHT L3 MEDIAL BRANCH BLOCK: A posterior oblique approach was taken
to the junction of the superior articular process and transverse
process on the right at L4 using a 3.5 inch 22 gauge spinal needle,
to lie along the course of the right L3 medial branch nerve. 0.5 mL
of 0.5% bupivacaine were injected.

LEFT L2 MEDIAL BRANCH BLOCK: A posterior oblique approach was taken
to the junction of the superior articular process and transverse
process on the left at L3 using a 3.5 inch 22 gauge spinal needle,
to lie along the course of the left L2 medial branch nerve. 0.5 mL
of 0.5% bupivacaine were injected.

LEFT L3 MEDIAL BRANCH BLOCK: A posterior oblique approach was taken
to the junction of the superior articular process and transverse
process on the left at L4 using a 3.5 inch 22 gauge spinal needle,
to lie along the course of the left L3 medial branch nerve. 0.5 mL
of 0.5% bupivacaine were injected.

The procedure was well-tolerated.
IMPRESSION: 1. Technically successful medial branch blocks in preparation for
bilateral L3-L4 facet radiofrequency ablation.

I will call the patient later today to assess for durable and
sustained pain relief. An addendum will be placed at that time.

ADDENDUM:
I called the patient at 4 p.m., approximately 7 hours after the
procedure. She has experienced significant improvement in her facet
mediated pain since the procedure. She is therefore a good candidate
for radiofrequency ablation and will be scheduled at her earliest
convenience.

*** End of Addendum ***
EXAM:
FLUOROSCOPICALLY GUIDED BILATERAL L2 AND L3 MEDIAL BRANCH BLOCKS, IN
PREPARATION FOR BILATERAL L3-L4 FACET RADIOFREQUENCY ABLATION

FLUOROSCOPY TIME:  Radiation Exposure Index (as provided by the
fluoroscopic device): 13.0 mGy

Fluoroscopy Time:  1 minute, 10 seconds

Number of Acquired Images:  0
An appropriate skin entry site was determined fluoroscopically and
marked. Site prepped with betadine, draped in usual sterile fashion,
and infiltrated locally with 1% lidocaine.

RIGHT L2 MEDIAL BRANCH BLOCK: A posterior oblique approach was taken
to the junction of the superior articular process and transverse
process on the right at L3 using a 3.5 inch 22 gauge spinal needle,
to lie along the course of the right L2 medial branch nerve. 0.5 mL
of 0.5% bupivacaine were injected.

RIGHT L3 MEDIAL BRANCH BLOCK: A posterior oblique approach was taken
to the junction of the superior articular process and transverse
process on the right at L4 using a 3.5 inch 22 gauge spinal needle,
to lie along the course of the right L3 medial branch nerve. 0.5 mL
of 0.5% bupivacaine were injected.

LEFT L2 MEDIAL BRANCH BLOCK: A posterior oblique approach was taken
to the junction of the superior articular process and transverse
process on the left at L3 using a 3.5 inch 22 gauge spinal needle,
to lie along the course of the left L2 medial branch nerve. 0.5 mL
of 0.5% bupivacaine were injected.

LEFT L3 MEDIAL BRANCH BLOCK: A posterior oblique approach was taken
to the junction of the superior articular process and transverse
process on the left at L4 using a 3.5 inch 22 gauge spinal needle,
to lie along the course of the left L3 medial branch nerve. 0.5 mL
of 0.5% bupivacaine were injected.

The procedure was well-tolerated.
IMPRESSION: 1. Technically successful medial branch blocks in preparation for
bilateral L3-L4 facet radiofrequency ablation.

I will call the patient later today to assess for durable and
sustained pain relief. An addendum will be placed at that time.

## 2020-02-09 IMAGING — XA DG FACET JT INJ L OR S SPINE SINGLE LEVEL UNI
4 series · 4 of 4 positions shown · non-contrast
Comparison: none

Addendum:
CLINICAL DATA: Facet mediated low back pain. Good relief from prior
bilateral L3-L4 facet injections. Medial branch blocks in
anticipation of RFA requested.
TECHNIQUE: The procedure, risks, benefits, and alternatives were explained to
the patient. Questions regarding the procedure were encouraged and
answered. The patient understands and consents to the procedure.

[Series 1: ortho adipose · 1 of 1 slices shown (1 of 4)]
[im 1/1]
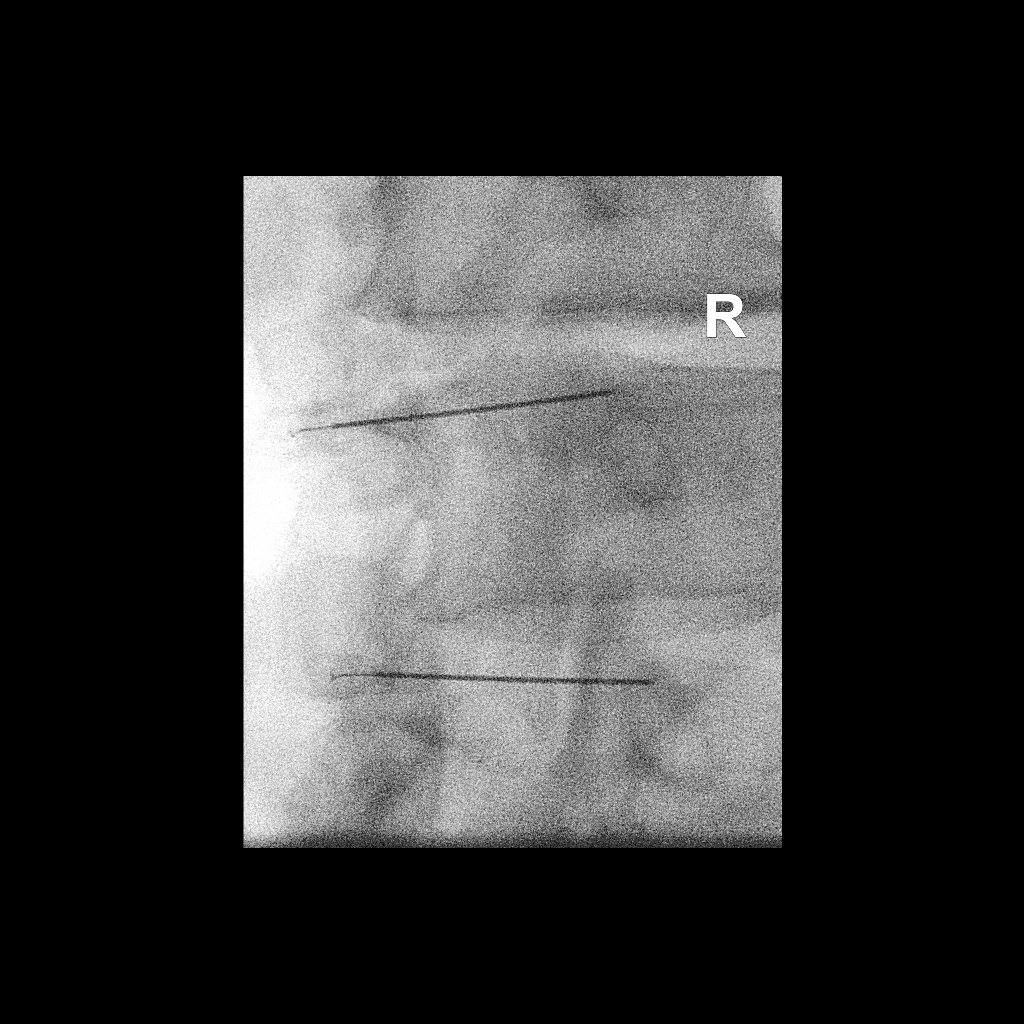

[Series 2: ortho adipose · 1 of 1 slices shown (2 of 4)]
[im 1/1]
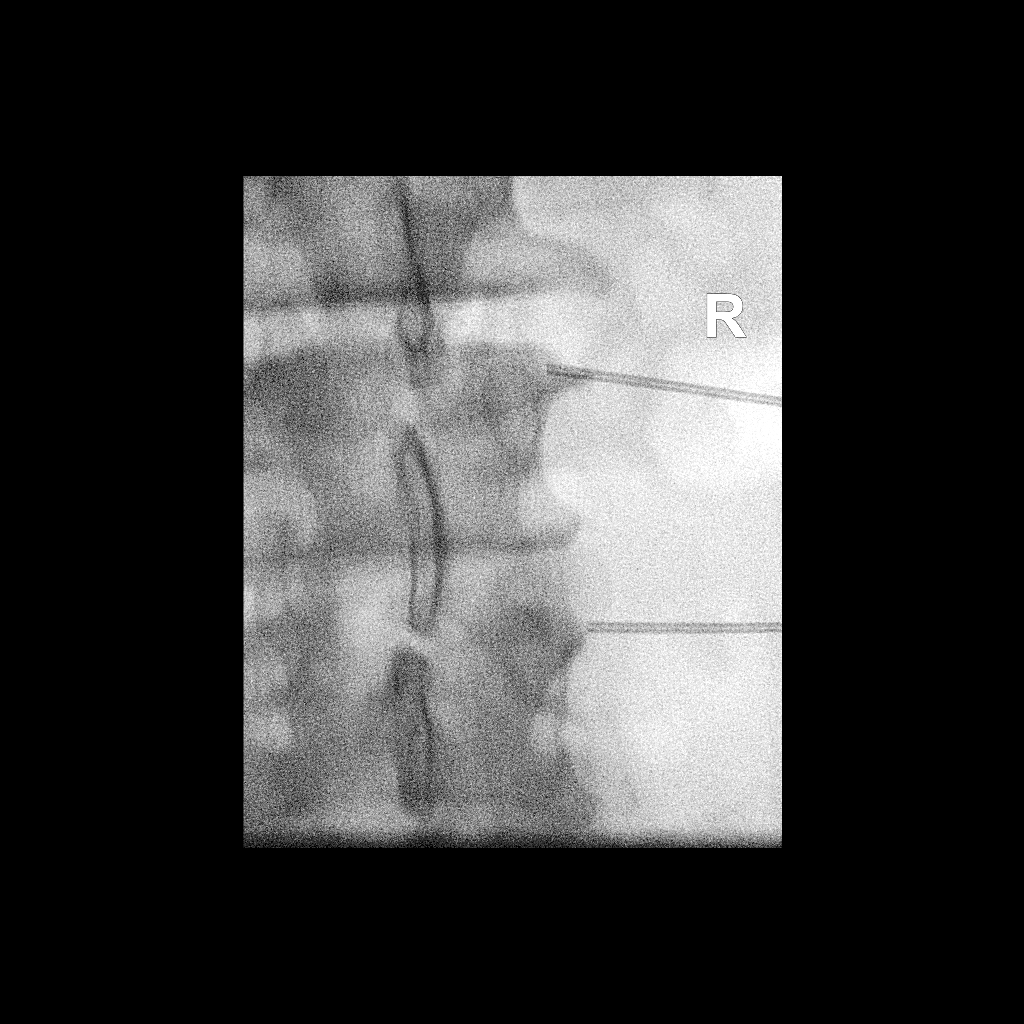

[Series 3: ortho adipose · 1 of 1 slices shown (3 of 4)]
[im 1/1]
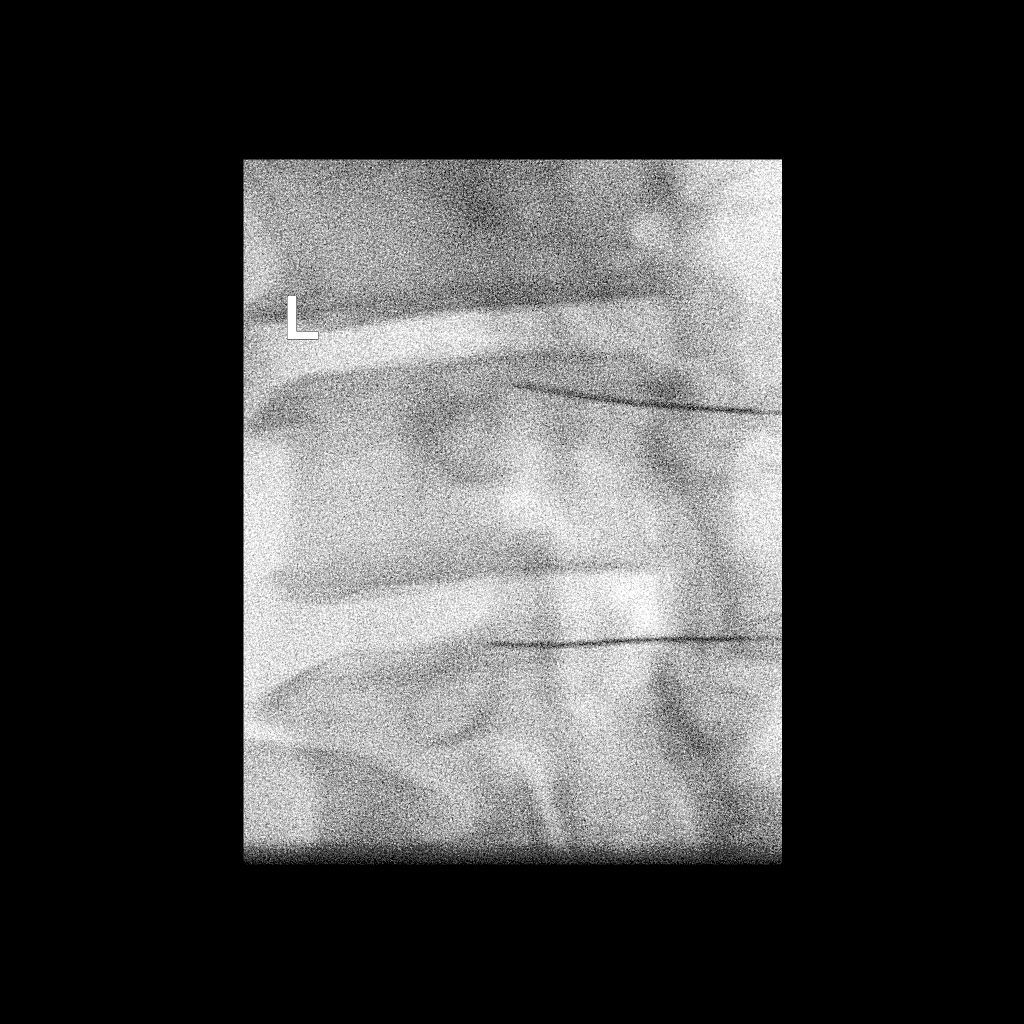

[Series 4: ortho adipose · 1 of 1 slices shown (4 of 4)]
[im 1/1]
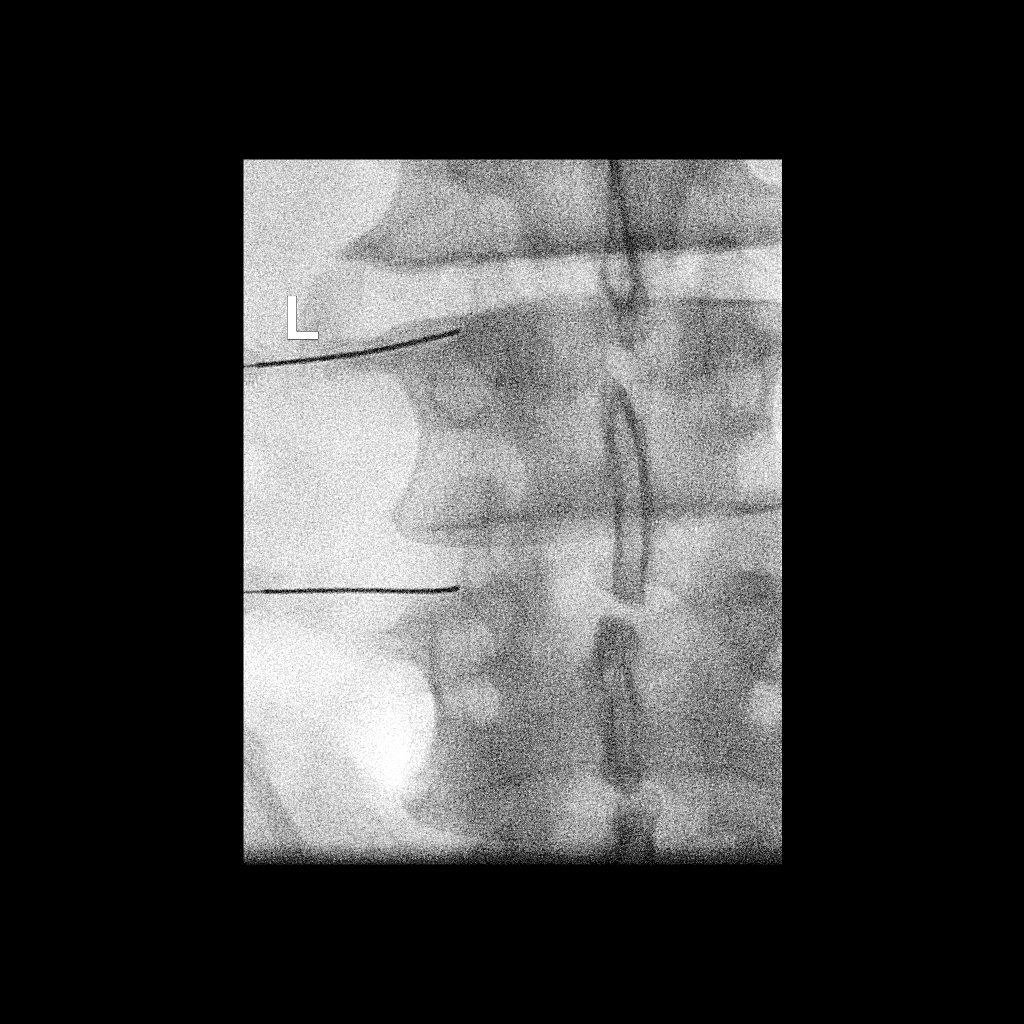

[4 of 4 positions shown; findings below may reference images not displayed]

EXAM:
FLUOROSCOPICALLY GUIDED BILATERAL L2 AND L3 MEDIAL BRANCH BLOCKS, IN
PREPARATION FOR BILATERAL L3-L4 FACET RADIOFREQUENCY ABLATION

FLUOROSCOPY TIME:  Radiation Exposure Index (as provided by the
fluoroscopic device): 13.0 mGy

Fluoroscopy Time:  1 minute, 10 seconds

Number of Acquired Images:  0
An appropriate skin entry site was determined fluoroscopically and
marked. Site prepped with betadine, draped in usual sterile fashion,
and infiltrated locally with 1% lidocaine.

RIGHT L2 MEDIAL BRANCH BLOCK: A posterior oblique approach was taken
to the junction of the superior articular process and transverse
process on the right at L3 using a 3.5 inch 22 gauge spinal needle,
to lie along the course of the right L2 medial branch nerve. 0.5 mL
of 0.5% bupivacaine were injected.

RIGHT L3 MEDIAL BRANCH BLOCK: A posterior oblique approach was taken
to the junction of the superior articular process and transverse
process on the right at L4 using a 3.5 inch 22 gauge spinal needle,
to lie along the course of the right L3 medial branch nerve. 0.5 mL
of 0.5% bupivacaine were injected.

LEFT L2 MEDIAL BRANCH BLOCK: A posterior oblique approach was taken
to the junction of the superior articular process and transverse
process on the left at L3 using a 3.5 inch 22 gauge spinal needle,
to lie along the course of the left L2 medial branch nerve. 0.5 mL
of 0.5% bupivacaine were injected.

LEFT L3 MEDIAL BRANCH BLOCK: A posterior oblique approach was taken
to the junction of the superior articular process and transverse
process on the left at L4 using a 3.5 inch 22 gauge spinal needle,
to lie along the course of the left L3 medial branch nerve. 0.5 mL
of 0.5% bupivacaine were injected.

The procedure was well-tolerated.
IMPRESSION: 1. Technically successful medial branch blocks in preparation for
bilateral L3-L4 facet radiofrequency ablation.

I will call the patient later today to assess for durable and
sustained pain relief. An addendum will be placed at that time.

ADDENDUM:
I called the patient at 4 p.m., approximately 7 hours after the
procedure. She has experienced significant improvement in her facet
mediated pain since the procedure. She is therefore a good candidate
for radiofrequency ablation and will be scheduled at her earliest
convenience.

*** End of Addendum ***
EXAM:
FLUOROSCOPICALLY GUIDED BILATERAL L2 AND L3 MEDIAL BRANCH BLOCKS, IN
PREPARATION FOR BILATERAL L3-L4 FACET RADIOFREQUENCY ABLATION

FLUOROSCOPY TIME:  Radiation Exposure Index (as provided by the
fluoroscopic device): 13.0 mGy

Fluoroscopy Time:  1 minute, 10 seconds

Number of Acquired Images:  0
An appropriate skin entry site was determined fluoroscopically and
marked. Site prepped with betadine, draped in usual sterile fashion,
and infiltrated locally with 1% lidocaine.

RIGHT L2 MEDIAL BRANCH BLOCK: A posterior oblique approach was taken
to the junction of the superior articular process and transverse
process on the right at L3 using a 3.5 inch 22 gauge spinal needle,
to lie along the course of the right L2 medial branch nerve. 0.5 mL
of 0.5% bupivacaine were injected.

RIGHT L3 MEDIAL BRANCH BLOCK: A posterior oblique approach was taken
to the junction of the superior articular process and transverse
process on the right at L4 using a 3.5 inch 22 gauge spinal needle,
to lie along the course of the right L3 medial branch nerve. 0.5 mL
of 0.5% bupivacaine were injected.

LEFT L2 MEDIAL BRANCH BLOCK: A posterior oblique approach was taken
to the junction of the superior articular process and transverse
process on the left at L3 using a 3.5 inch 22 gauge spinal needle,
to lie along the course of the left L2 medial branch nerve. 0.5 mL
of 0.5% bupivacaine were injected.

LEFT L3 MEDIAL BRANCH BLOCK: A posterior oblique approach was taken
to the junction of the superior articular process and transverse
process on the left at L4 using a 3.5 inch 22 gauge spinal needle,
to lie along the course of the left L3 medial branch nerve. 0.5 mL
of 0.5% bupivacaine were injected.

The procedure was well-tolerated.
IMPRESSION: 1. Technically successful medial branch blocks in preparation for
bilateral L3-L4 facet radiofrequency ablation.

I will call the patient later today to assess for durable and
sustained pain relief. An addendum will be placed at that time.

## 2020-02-09 IMAGING — XA DG FACET JT INJ L OR S SPINE SINGLE LEVEL UNI
4 series · 4 of 4 positions shown · non-contrast
Comparison: none

Addendum:
CLINICAL DATA: Facet mediated low back pain. Good relief from prior
bilateral L3-L4 facet injections. Medial branch blocks in
anticipation of RFA requested.
TECHNIQUE: The procedure, risks, benefits, and alternatives were explained to
the patient. Questions regarding the procedure were encouraged and
answered. The patient understands and consents to the procedure.

[Series 1: ortho adipose · 1 of 1 slices shown (1 of 4)]
[im 1/1]
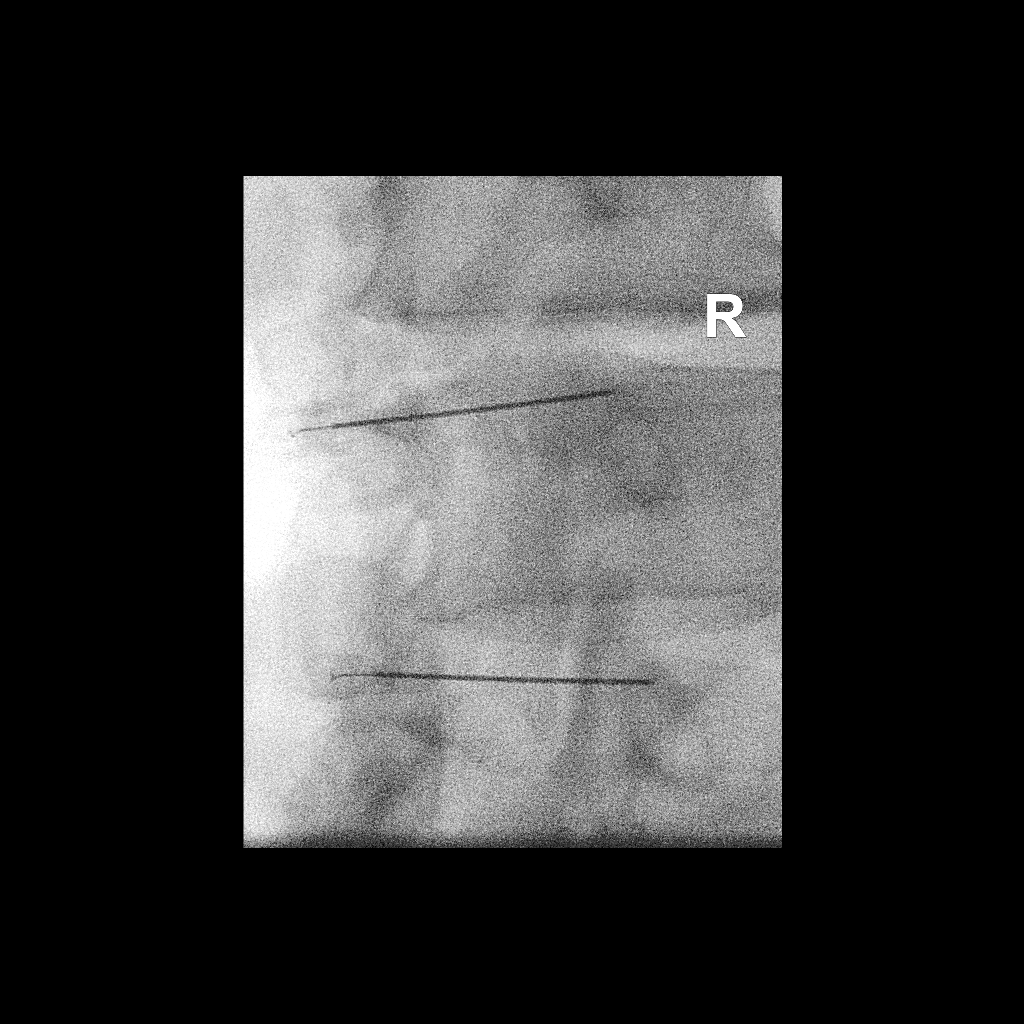

[Series 2: ortho adipose · 1 of 1 slices shown (2 of 4)]
[im 1/1]
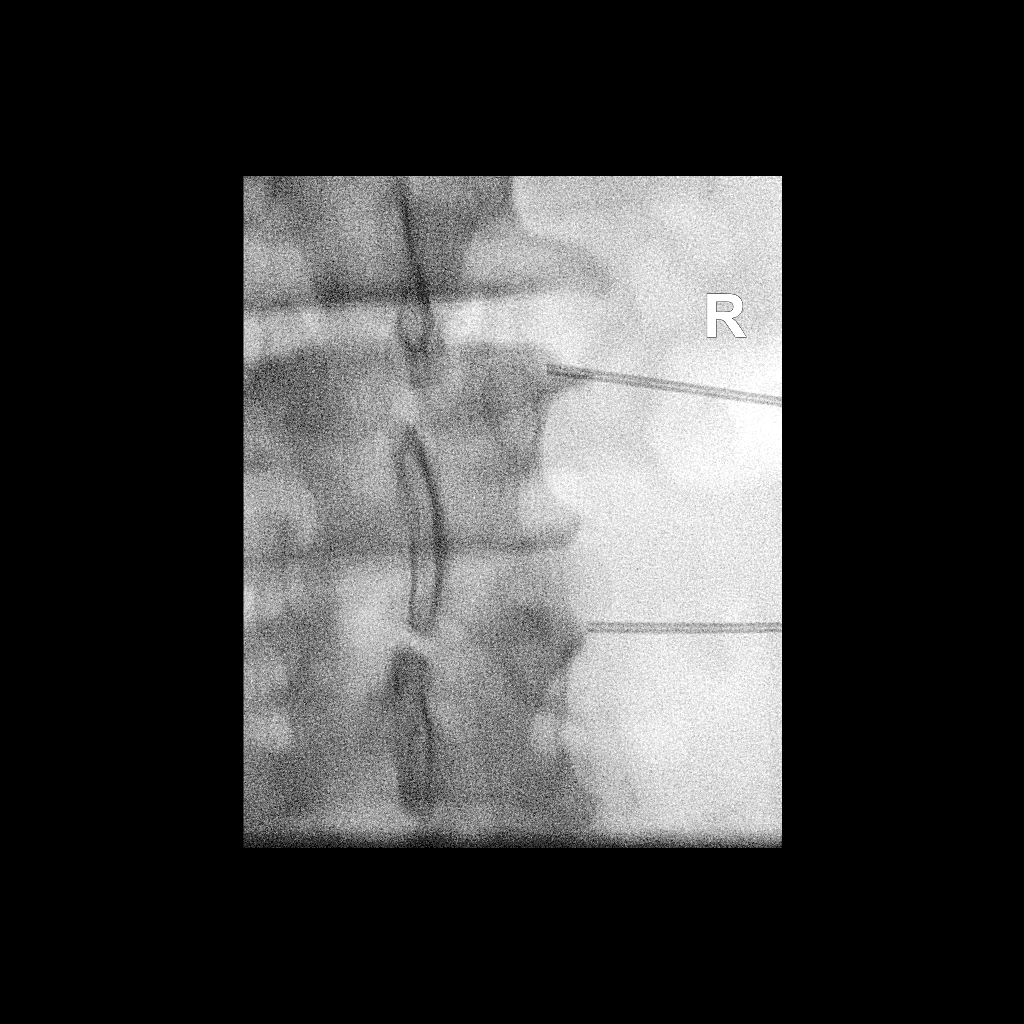

[Series 3: ortho adipose · 1 of 1 slices shown (3 of 4)]
[im 1/1]
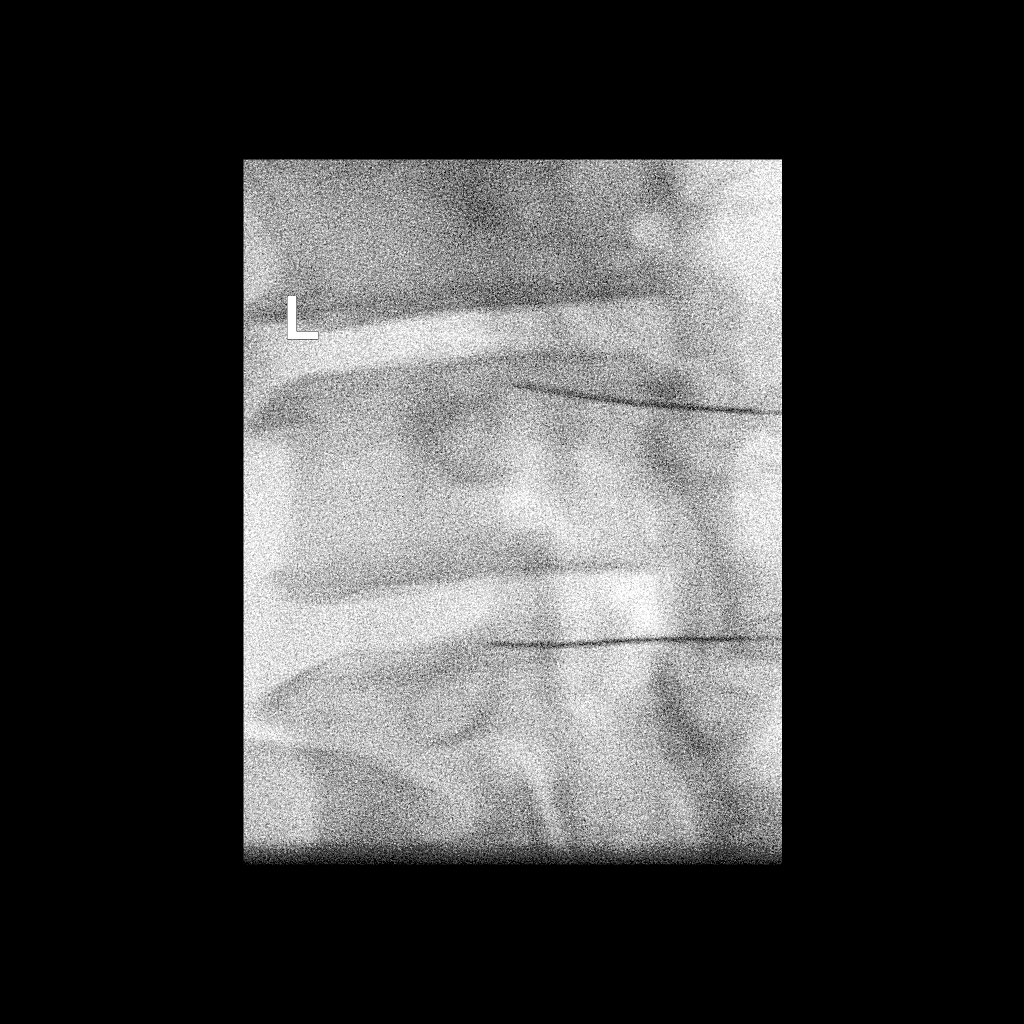

[Series 4: ortho adipose · 1 of 1 slices shown (4 of 4)]
[im 1/1]
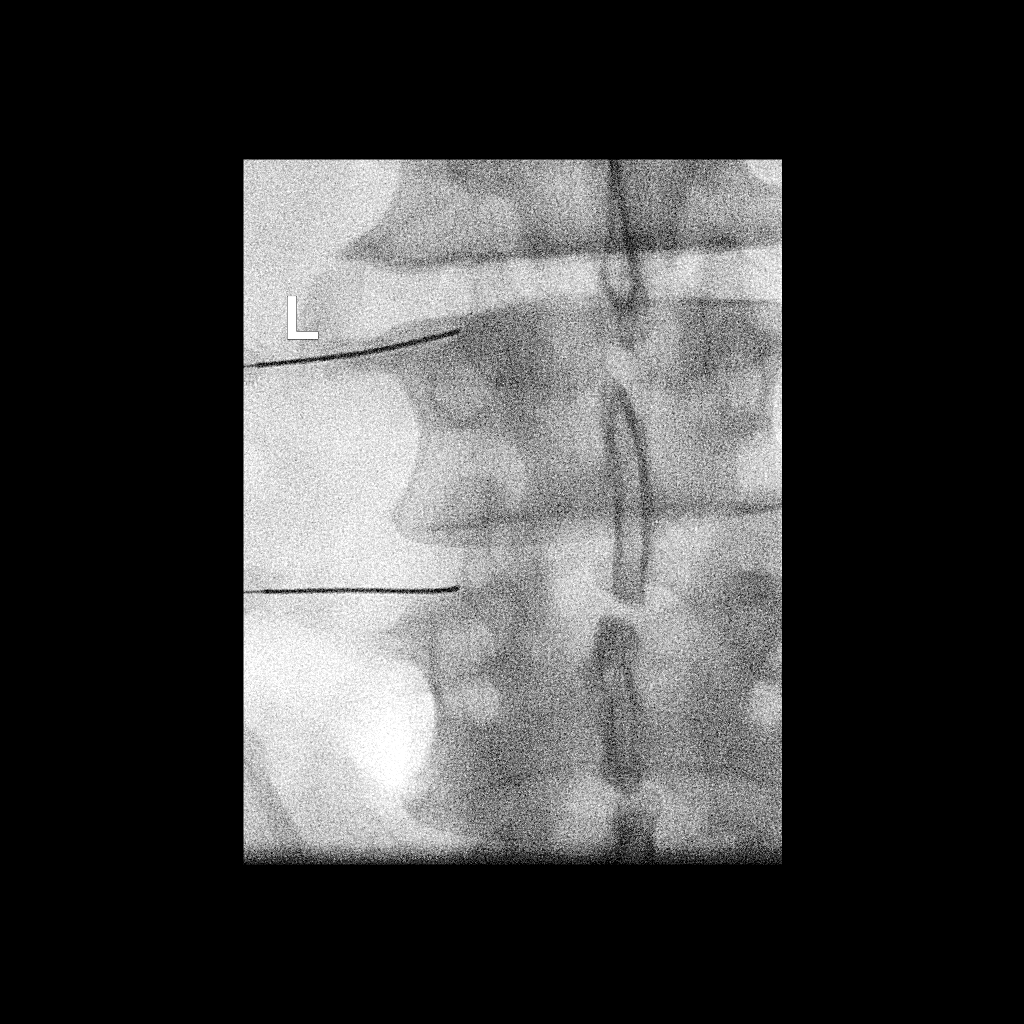

[4 of 4 positions shown; findings below may reference images not displayed]

EXAM:
FLUOROSCOPICALLY GUIDED BILATERAL L2 AND L3 MEDIAL BRANCH BLOCKS, IN
PREPARATION FOR BILATERAL L3-L4 FACET RADIOFREQUENCY ABLATION

FLUOROSCOPY TIME:  Radiation Exposure Index (as provided by the
fluoroscopic device): 13.0 mGy

Fluoroscopy Time:  1 minute, 10 seconds

Number of Acquired Images:  0
An appropriate skin entry site was determined fluoroscopically and
marked. Site prepped with betadine, draped in usual sterile fashion,
and infiltrated locally with 1% lidocaine.

RIGHT L2 MEDIAL BRANCH BLOCK: A posterior oblique approach was taken
to the junction of the superior articular process and transverse
process on the right at L3 using a 3.5 inch 22 gauge spinal needle,
to lie along the course of the right L2 medial branch nerve. 0.5 mL
of 0.5% bupivacaine were injected.

RIGHT L3 MEDIAL BRANCH BLOCK: A posterior oblique approach was taken
to the junction of the superior articular process and transverse
process on the right at L4 using a 3.5 inch 22 gauge spinal needle,
to lie along the course of the right L3 medial branch nerve. 0.5 mL
of 0.5% bupivacaine were injected.

LEFT L2 MEDIAL BRANCH BLOCK: A posterior oblique approach was taken
to the junction of the superior articular process and transverse
process on the left at L3 using a 3.5 inch 22 gauge spinal needle,
to lie along the course of the left L2 medial branch nerve. 0.5 mL
of 0.5% bupivacaine were injected.

LEFT L3 MEDIAL BRANCH BLOCK: A posterior oblique approach was taken
to the junction of the superior articular process and transverse
process on the left at L4 using a 3.5 inch 22 gauge spinal needle,
to lie along the course of the left L3 medial branch nerve. 0.5 mL
of 0.5% bupivacaine were injected.

The procedure was well-tolerated.
IMPRESSION: 1. Technically successful medial branch blocks in preparation for
bilateral L3-L4 facet radiofrequency ablation.

I will call the patient later today to assess for durable and
sustained pain relief. An addendum will be placed at that time.

ADDENDUM:
I called the patient at 4 p.m., approximately 7 hours after the
procedure. She has experienced significant improvement in her facet
mediated pain since the procedure. She is therefore a good candidate
for radiofrequency ablation and will be scheduled at her earliest
convenience.

*** End of Addendum ***
EXAM:
FLUOROSCOPICALLY GUIDED BILATERAL L2 AND L3 MEDIAL BRANCH BLOCKS, IN
PREPARATION FOR BILATERAL L3-L4 FACET RADIOFREQUENCY ABLATION

FLUOROSCOPY TIME:  Radiation Exposure Index (as provided by the
fluoroscopic device): 13.0 mGy

Fluoroscopy Time:  1 minute, 10 seconds

Number of Acquired Images:  0
An appropriate skin entry site was determined fluoroscopically and
marked. Site prepped with betadine, draped in usual sterile fashion,
and infiltrated locally with 1% lidocaine.

RIGHT L2 MEDIAL BRANCH BLOCK: A posterior oblique approach was taken
to the junction of the superior articular process and transverse
process on the right at L3 using a 3.5 inch 22 gauge spinal needle,
to lie along the course of the right L2 medial branch nerve. 0.5 mL
of 0.5% bupivacaine were injected.

RIGHT L3 MEDIAL BRANCH BLOCK: A posterior oblique approach was taken
to the junction of the superior articular process and transverse
process on the right at L4 using a 3.5 inch 22 gauge spinal needle,
to lie along the course of the right L3 medial branch nerve. 0.5 mL
of 0.5% bupivacaine were injected.

LEFT L2 MEDIAL BRANCH BLOCK: A posterior oblique approach was taken
to the junction of the superior articular process and transverse
process on the left at L3 using a 3.5 inch 22 gauge spinal needle,
to lie along the course of the left L2 medial branch nerve. 0.5 mL
of 0.5% bupivacaine were injected.

LEFT L3 MEDIAL BRANCH BLOCK: A posterior oblique approach was taken
to the junction of the superior articular process and transverse
process on the left at L4 using a 3.5 inch 22 gauge spinal needle,
to lie along the course of the left L3 medial branch nerve. 0.5 mL
of 0.5% bupivacaine were injected.

The procedure was well-tolerated.
IMPRESSION: 1. Technically successful medial branch blocks in preparation for
bilateral L3-L4 facet radiofrequency ablation.

I will call the patient later today to assess for durable and
sustained pain relief. An addendum will be placed at that time.

## 2020-02-10 ENCOUNTER — Encounter (INDEPENDENT_AMBULATORY_CARE_PROVIDER_SITE_OTHER): Payer: Self-pay | Admitting: Family Medicine

## 2020-02-14 ENCOUNTER — Ambulatory Visit (INDEPENDENT_AMBULATORY_CARE_PROVIDER_SITE_OTHER): Payer: No Typology Code available for payment source | Admitting: Adult Health

## 2020-02-17 MED FILL — NORLYDA 0.35 MG TABS: 0.35 | 84 days supply | Qty: 84 | Fill #3

## 2020-02-27 ENCOUNTER — Other Ambulatory Visit: Payer: Self-pay | Admitting: Family Medicine

## 2020-02-27 ENCOUNTER — Other Ambulatory Visit: Payer: Self-pay

## 2020-02-27 ENCOUNTER — Ambulatory Visit
Admission: RE | Admit: 2020-02-27 | Discharge: 2020-02-27 | Disposition: A | Discharge status: Home / Self Care | Payer: No Typology Code available for payment source | Source: Ambulatory Visit | Attending: Family Medicine | Admitting: Family Medicine

## 2020-02-27 DIAGNOSIS — G8929 Other chronic pain: Secondary | ICD-10-CM

## 2020-02-27 DIAGNOSIS — M545 Low back pain, unspecified: Secondary | ICD-10-CM

## 2020-02-27 IMAGING — XA DG FACET JT NEURO DESTRUCT EA ADD L/S W/ IMAG GUIDE
10 of 12 series · 12 of 15 positions shown · non-contrast
Comparison: none

CLINICAL DATA: Facet mediated low back pain. Excellent response to
bilateral L3-L4 facet medial branch blocks. Patient presents for
ablation.
TECHNIQUE: The procedure, risks, benefits, and alternatives were explained to
the patient. Questions regarding the procedure were encouraged and
answered. The patient understands and consents to the procedure.

[Series 1: ortho adipose · 1 of 1 slices shown (1 of 10)]
[im 1/1]
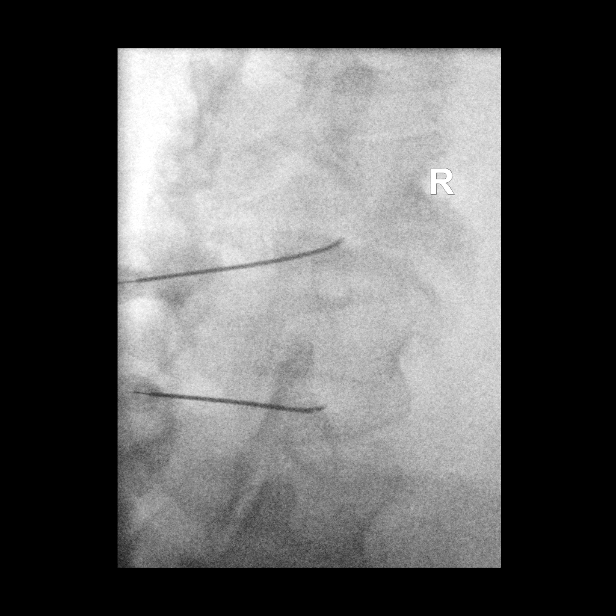

[Series 2: ortho adipose · 1 of 1 slices shown (2 of 10)]
[im 1/1]
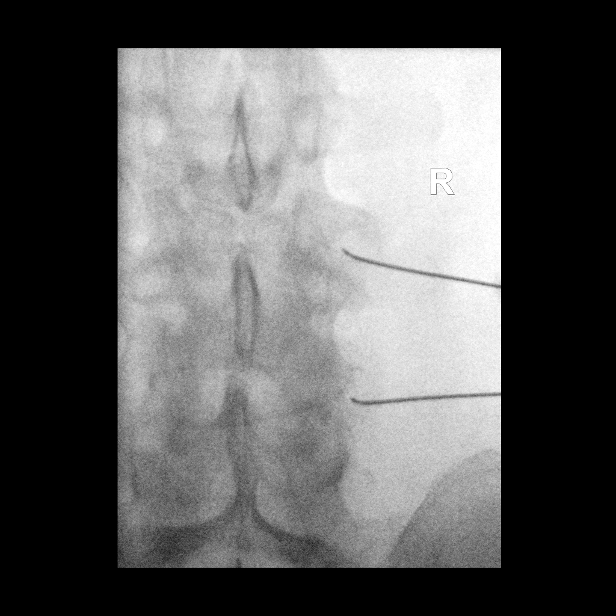

[Series 4: ortho adipose · 1 of 1 slices shown (3 of 10)]
[im 1/1]
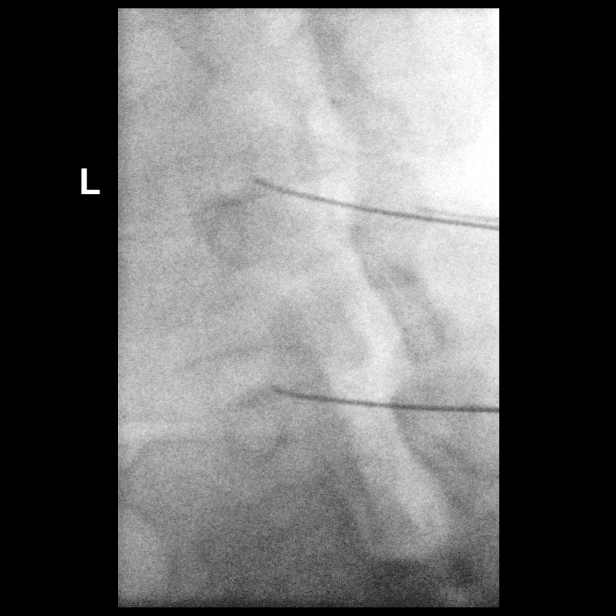

[Series 5: ortho adipose · 1 of 1 slices shown (4 of 10)]
[im 1/1]
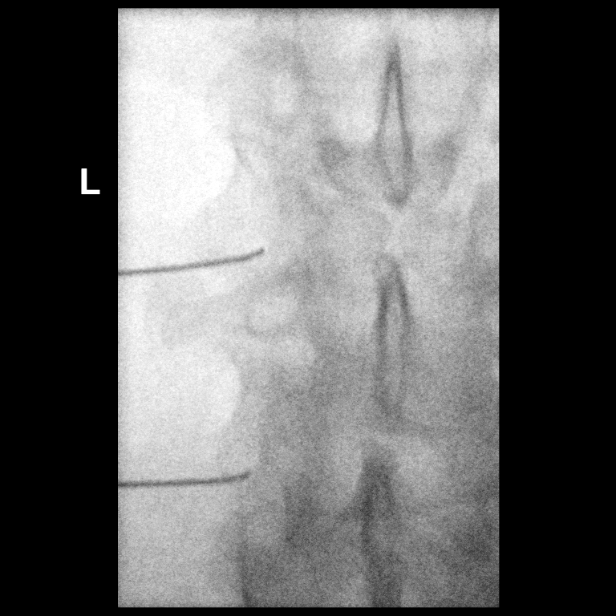

[Series 6: ortho adipose · 1 of 1 slices shown (5 of 10)]
[im 1/1]
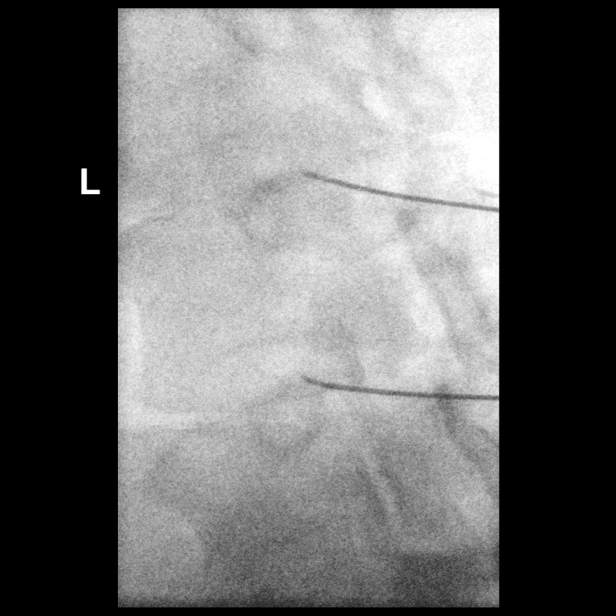

[Series 7: ortho adipose · 1 of 1 slices shown (6 of 10)]
[im 1/1]
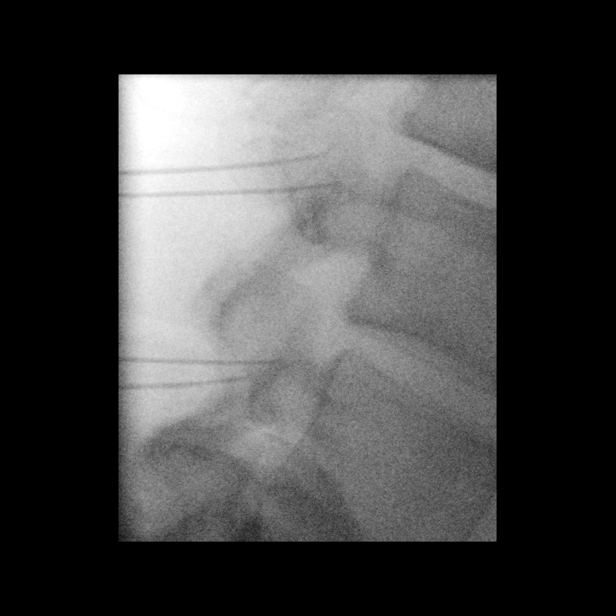

[Series 9: ortho adipose · 1 of 1 slices shown (7 of 10)]
[im 1/1]
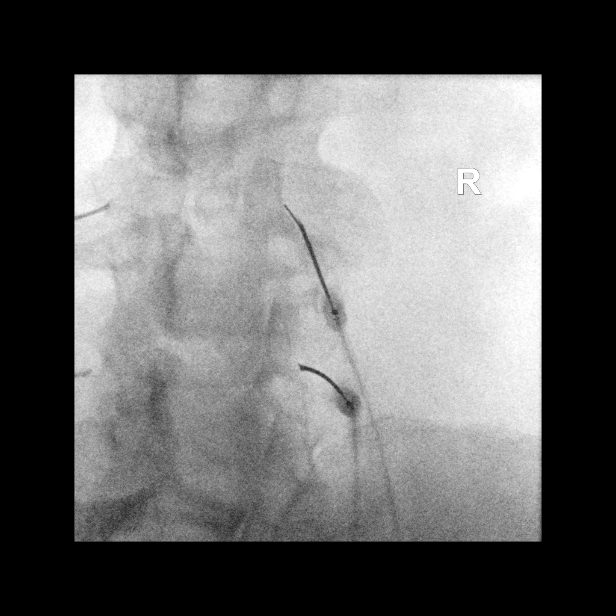

[Series 10: ortho adipose · 1 of 1 slices shown (8 of 10)]
[im 1/1]
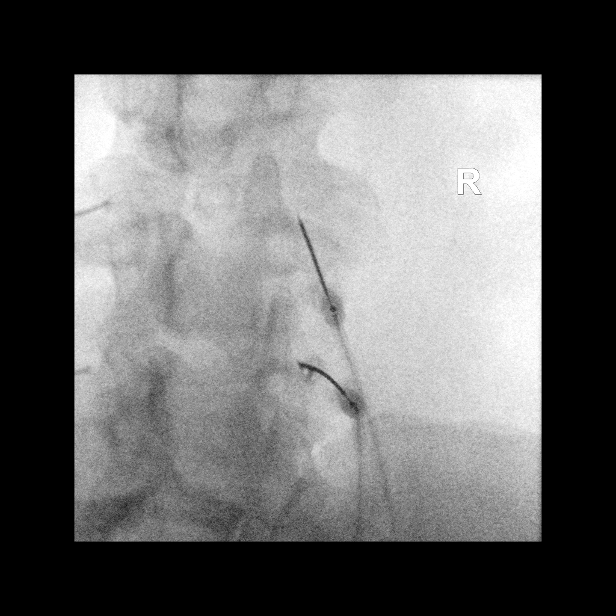

[Series 11: ortho adipose · 1 of 1 slices shown (9 of 10)]
[im 1/1]
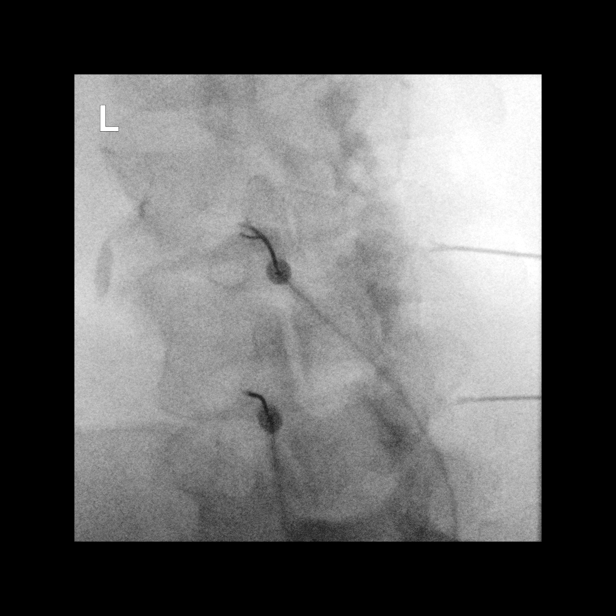

[Series 13: ortho adipose · 2 acquisitions, 3 frames shown (10 of 10)]
[im 1/2]
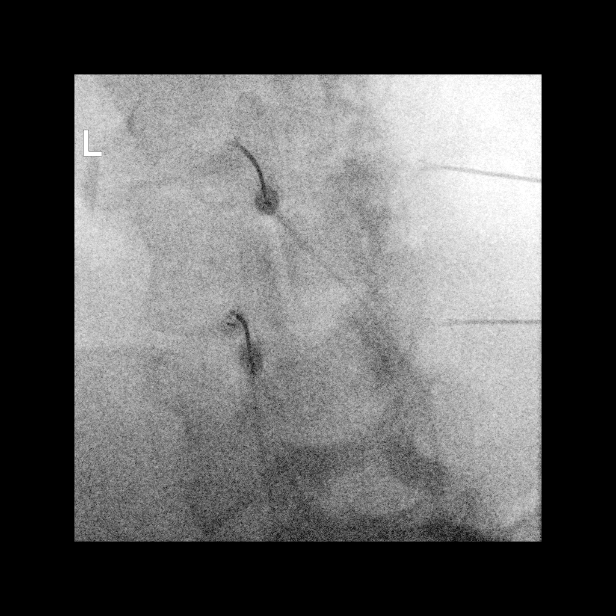
[im 1/2]
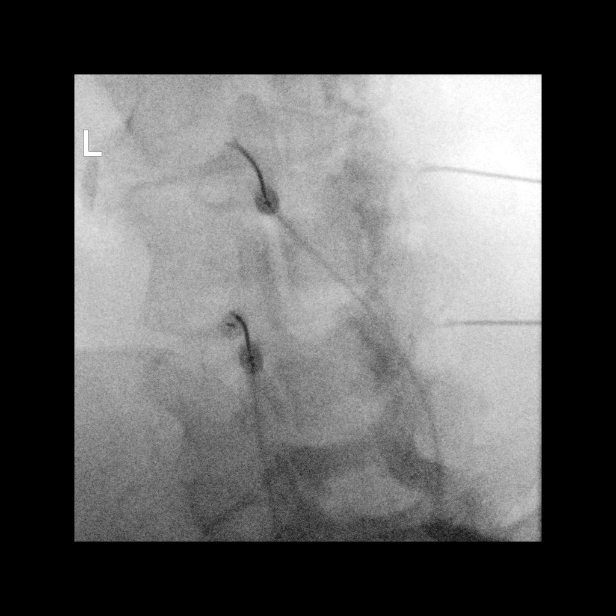
[im 2/2]
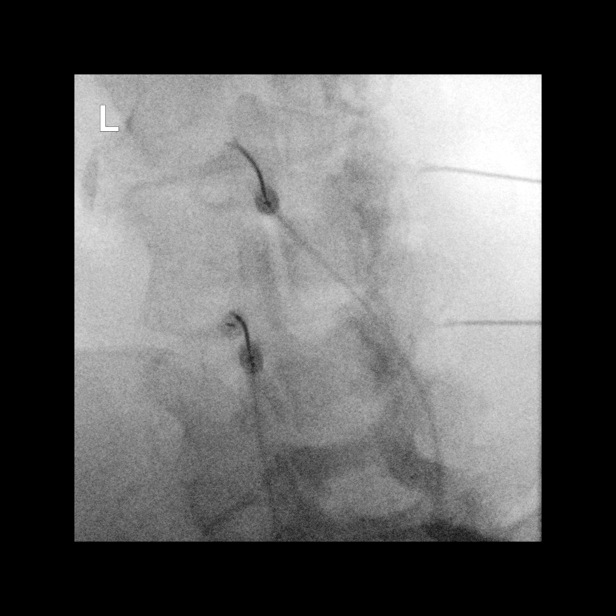

[12 of 15 positions shown; findings below may reference images not displayed]

EXAM:
FLUOROSCOPICALLY GUIDED FACET RADIOFREQUENCY ABLATION OF THE
BILATERAL L3-L4 FACET JOINTS

FLUOROSCOPY TIME:  Radiation Exposure Index (as provided by the
fluoroscopic device): 36.8 mGy

Fluoroscopy Time:  2 minutes, 28 seconds

Number of Acquired Images:  0

SEDATION:
Moderate (conscious) sedation was employed during this procedure. A
total of Versed 4 mg and Fentanyl 100 mcg were administered
intravenously. 30 mg Toradol were administered at the conclusion of
the procedure.

Moderate Sedation Time: 60 minutes. The patient's level of
consciousness and vital signs were monitored continuously by
radiology nursing throughout the procedure under my direct
supervision.
A grounding pad was placed on the right leg and confirmed visually.

An appropriate skin entry site was determined fluoroscopically and
marked. Site prepped with betadine, draped in usual sterile fashion,
and infiltrated locally with 1% lidocaine.

RIGHT L2 MEDIAL BRANCH RFA: A posterior oblique approach was taken
to the junction of the superior articular process and transverse
process on the right at L3 using a 20 gauge 15 cm [REDACTED] needle, to
lie along the course of the right L2 medial branch nerve. The 10 mm
active tip probe was placed through the needle, with subsequent
detection of low impedance. Sensory and motor stimulation elicited
concordant symptoms at low voltage, without lower extremity sensory
or motor symptoms. Using a tandem needle, 1 mL of 1% lidocaine was
placed around the probe tip to mitigate the acute symptoms of the
ablation.

RIGHT L3 MEDIAL BRANCH RFA: A posterior oblique approach was taken
to the junction of the superior articular process and transverse
process on the right at L4 using a 20 gauge 15 cm [REDACTED] needle, to
lie along the course of the right L3 medial branch nerve. The 10 mm
active tip probe was placed through the needle, with subsequent
detection of low impedance. Sensory and motor stimulation elicited
concordant symptoms at low voltage, without lower extremity sensory
or motor symptoms. Using a tandem needle, 1 mL of 1% lidocaine was
placed around the probe tip to mitigate the acute symptoms of the
ablation.

LEFT L2 MEDIAL BRANCH RFA: A posterior oblique approach was taken to
the junction of the superior articular process and transverse
process on the left at L3 using a 20 gauge 15 cm [REDACTED] needle, to
lie along the course of the left L2 medial branch nerve. The 10 mm
active tip probe was placed through the needle, with subsequent
detection of low impedance. Sensory and motor stimulation elicited
concordant symptoms at low voltage, without lower extremity sensory
or motor symptoms. Using a tandem needle, 1 mL of 1% lidocaine was
placed around the probe tip to mitigate the acute symptoms of the
ablation.

LEFT L3 MEDIAL BRANCH RFA: A posterior oblique approach was taken to
the junction of the superior articular process and transverse
process on the left at L4 using a 20 gauge 15 cm [REDACTED] needle, to
lie along the course of the left L3 medial branch nerve. The 10 mm
active tip probe was placed through the needle, with subsequent
detection of low impedance. Sensory and motor stimulation elicited
concordant symptoms at low voltage, without lower extremity sensory
or motor symptoms. Using a tandem needle, 1 mL of 1% lidocaine was
placed around the probe tip to mitigate the acute symptoms of the
ablation.

Finally, the lesions were created uneventfully for 90 seconds at 80
degrees Celsius. Afterwards 30 mg Depo-Medrol mixed with 0.5 mL of
1% lidocaine were administered at the ablation sites. The needles
were subsequently removed.

The procedure was well-tolerated. After the procedure, the patient
was able to ambulate without difficulty, leg weakness, or numbness.
IMPRESSION: 1. Technically successful bilateral L3-L4 facet radiofrequency
ablations.

## 2020-02-27 IMAGING — XA DG FACET JT NEURO DESTRUCT EA ADD L/S W/ IMAG GUIDE
10 of 12 series · 12 of 15 positions shown · non-contrast
Comparison: none

CLINICAL DATA: Facet mediated low back pain. Excellent response to
bilateral L3-L4 facet medial branch blocks. Patient presents for
ablation.
TECHNIQUE: The procedure, risks, benefits, and alternatives were explained to
the patient. Questions regarding the procedure were encouraged and
answered. The patient understands and consents to the procedure.

[Series 1: ortho adipose · 1 of 1 slices shown (1 of 10)]
[im 1/1]
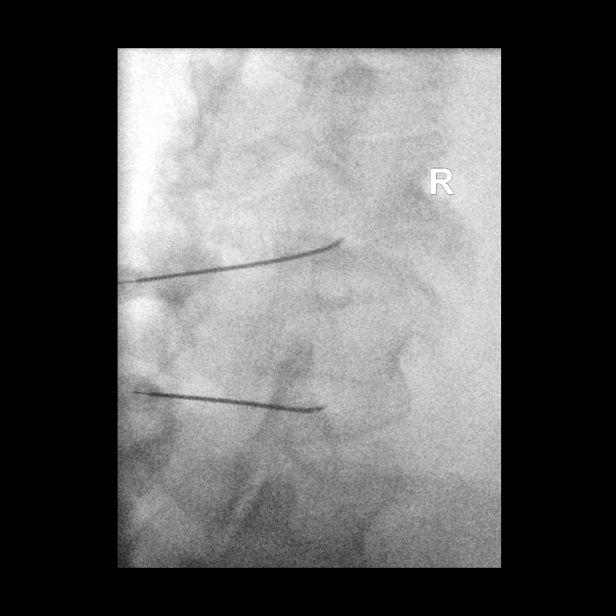

[Series 2: ortho adipose · 1 of 1 slices shown (2 of 10)]
[im 1/1]
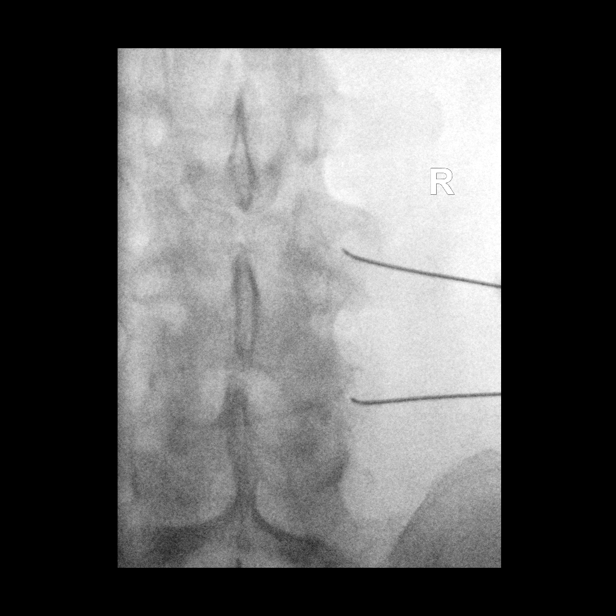

[Series 4: ortho adipose · 1 of 1 slices shown (3 of 10)]
[im 1/1]
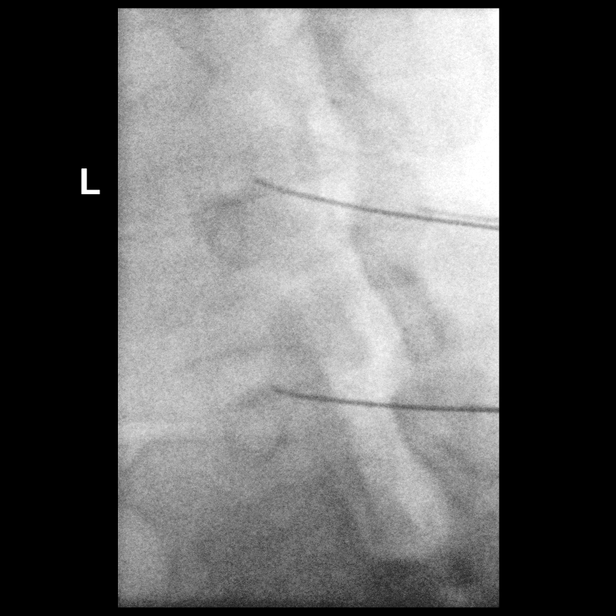

[Series 5: ortho adipose · 1 of 1 slices shown (4 of 10)]
[im 1/1]
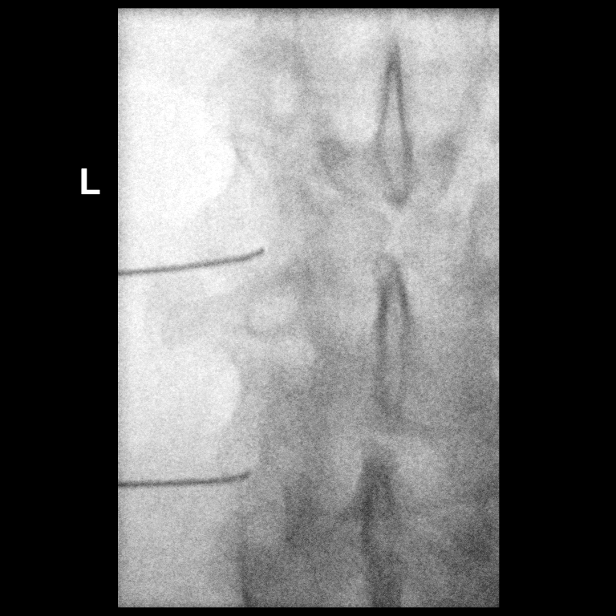

[Series 6: ortho adipose · 1 of 1 slices shown (5 of 10)]
[im 1/1]
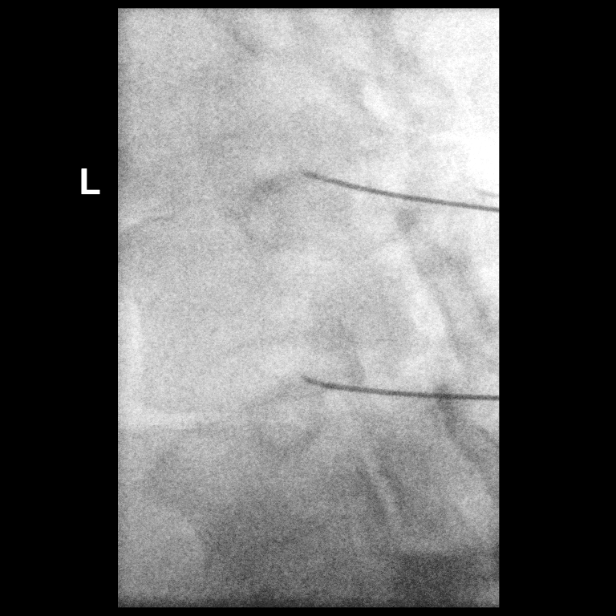

[Series 7: ortho adipose · 1 of 1 slices shown (6 of 10)]
[im 1/1]
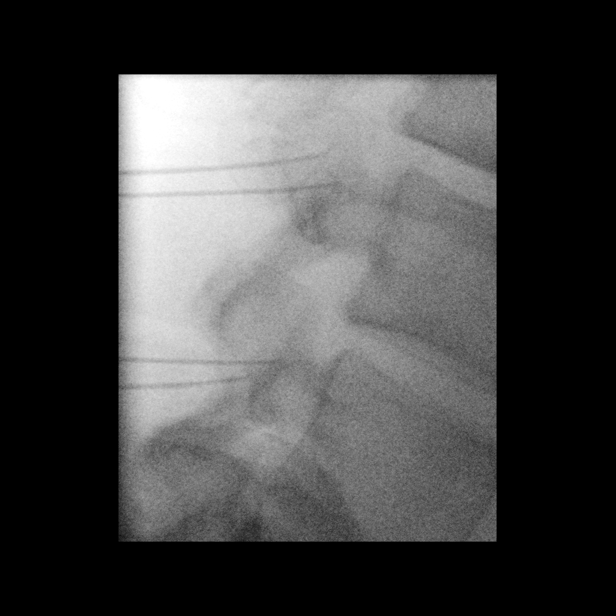

[Series 9: ortho adipose · 1 of 1 slices shown (7 of 10)]
[im 1/1]
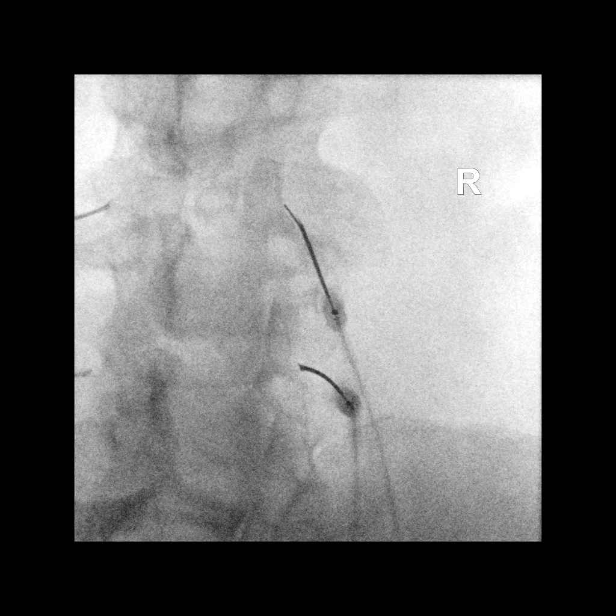

[Series 10: ortho adipose · 1 of 1 slices shown (8 of 10)]
[im 1/1]
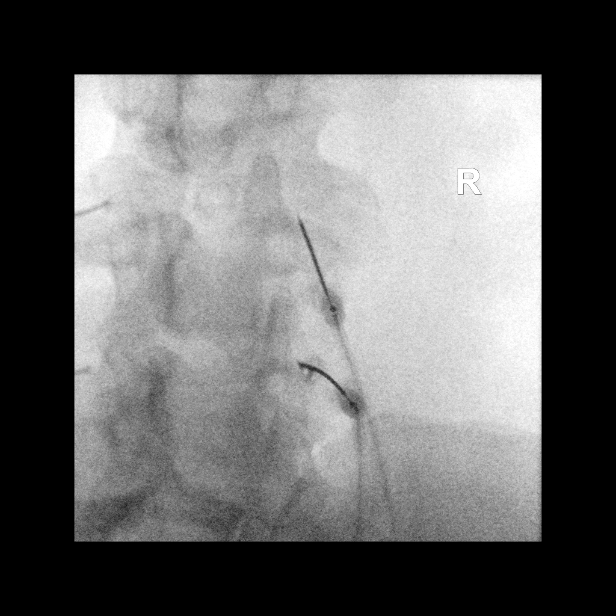

[Series 11: ortho adipose · 1 of 1 slices shown (9 of 10)]
[im 1/1]
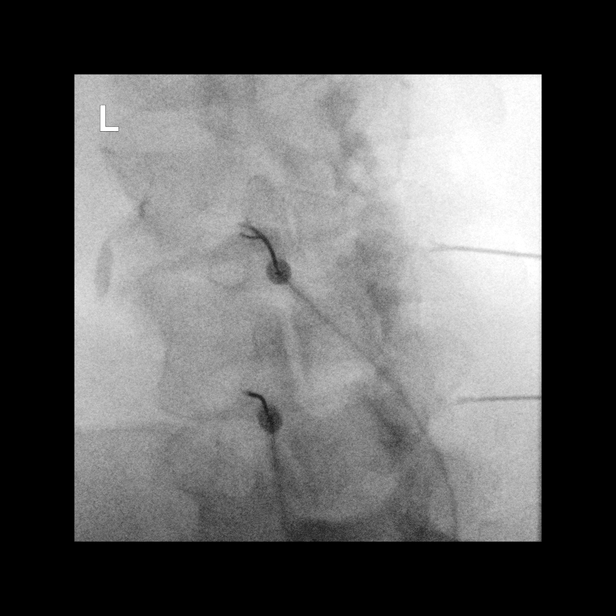

[Series 13: ortho adipose · 2 acquisitions, 3 frames shown (10 of 10)]
[im 1/2]
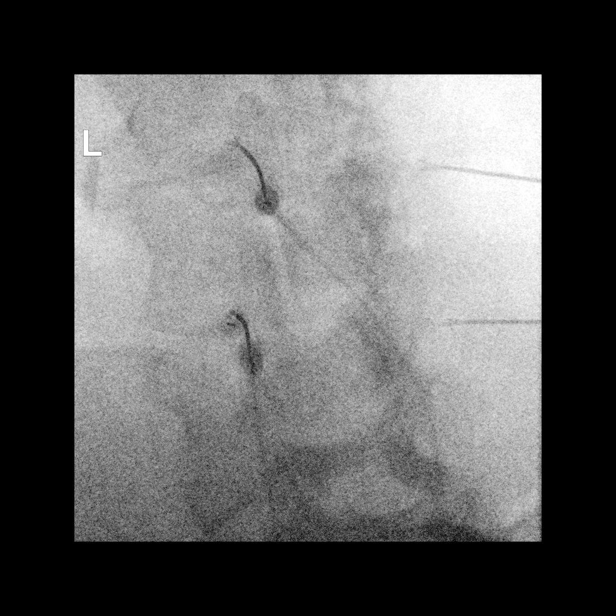
[im 1/2]
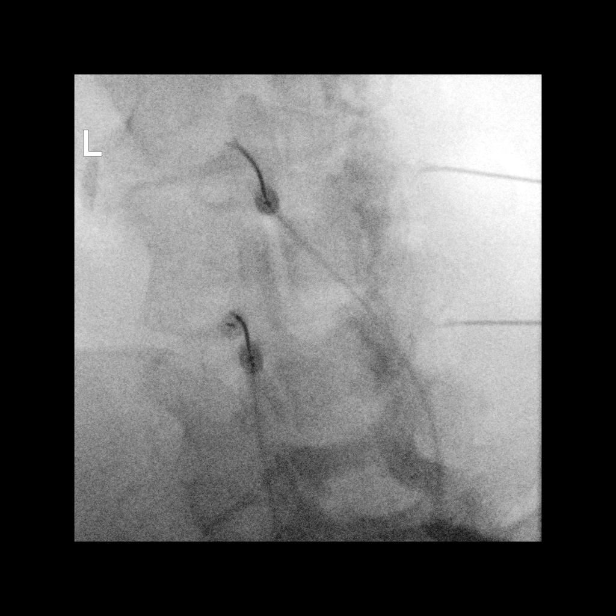
[im 2/2]
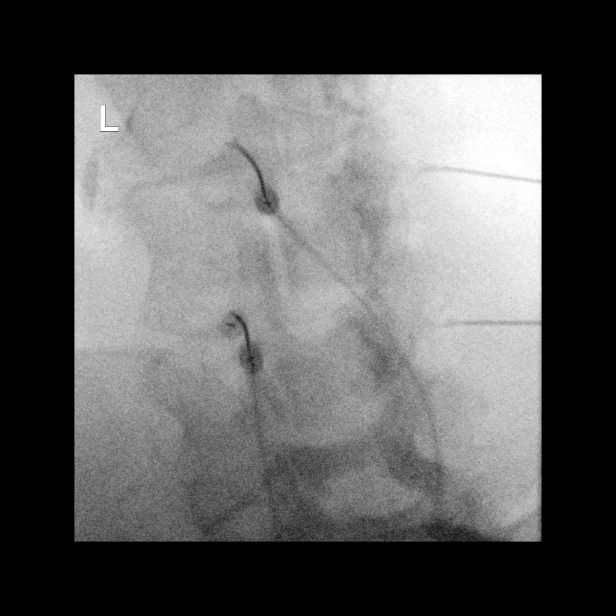

[12 of 15 positions shown; findings below may reference images not displayed]

EXAM:
FLUOROSCOPICALLY GUIDED FACET RADIOFREQUENCY ABLATION OF THE
BILATERAL L3-L4 FACET JOINTS

FLUOROSCOPY TIME:  Radiation Exposure Index (as provided by the
fluoroscopic device): 36.8 mGy

Fluoroscopy Time:  2 minutes, 28 seconds

Number of Acquired Images:  0

SEDATION:
Moderate (conscious) sedation was employed during this procedure. A
total of Versed 4 mg and Fentanyl 100 mcg were administered
intravenously. 30 mg Toradol were administered at the conclusion of
the procedure.

Moderate Sedation Time: 60 minutes. The patient's level of
consciousness and vital signs were monitored continuously by
radiology nursing throughout the procedure under my direct
supervision.
A grounding pad was placed on the right leg and confirmed visually.

An appropriate skin entry site was determined fluoroscopically and
marked. Site prepped with betadine, draped in usual sterile fashion,
and infiltrated locally with 1% lidocaine.

RIGHT L2 MEDIAL BRANCH RFA: A posterior oblique approach was taken
to the junction of the superior articular process and transverse
process on the right at L3 using a 20 gauge 15 cm [REDACTED] needle, to
lie along the course of the right L2 medial branch nerve. The 10 mm
active tip probe was placed through the needle, with subsequent
detection of low impedance. Sensory and motor stimulation elicited
concordant symptoms at low voltage, without lower extremity sensory
or motor symptoms. Using a tandem needle, 1 mL of 1% lidocaine was
placed around the probe tip to mitigate the acute symptoms of the
ablation.

RIGHT L3 MEDIAL BRANCH RFA: A posterior oblique approach was taken
to the junction of the superior articular process and transverse
process on the right at L4 using a 20 gauge 15 cm [REDACTED] needle, to
lie along the course of the right L3 medial branch nerve. The 10 mm
active tip probe was placed through the needle, with subsequent
detection of low impedance. Sensory and motor stimulation elicited
concordant symptoms at low voltage, without lower extremity sensory
or motor symptoms. Using a tandem needle, 1 mL of 1% lidocaine was
placed around the probe tip to mitigate the acute symptoms of the
ablation.

LEFT L2 MEDIAL BRANCH RFA: A posterior oblique approach was taken to
the junction of the superior articular process and transverse
process on the left at L3 using a 20 gauge 15 cm [REDACTED] needle, to
lie along the course of the left L2 medial branch nerve. The 10 mm
active tip probe was placed through the needle, with subsequent
detection of low impedance. Sensory and motor stimulation elicited
concordant symptoms at low voltage, without lower extremity sensory
or motor symptoms. Using a tandem needle, 1 mL of 1% lidocaine was
placed around the probe tip to mitigate the acute symptoms of the
ablation.

LEFT L3 MEDIAL BRANCH RFA: A posterior oblique approach was taken to
the junction of the superior articular process and transverse
process on the left at L4 using a 20 gauge 15 cm [REDACTED] needle, to
lie along the course of the left L3 medial branch nerve. The 10 mm
active tip probe was placed through the needle, with subsequent
detection of low impedance. Sensory and motor stimulation elicited
concordant symptoms at low voltage, without lower extremity sensory
or motor symptoms. Using a tandem needle, 1 mL of 1% lidocaine was
placed around the probe tip to mitigate the acute symptoms of the
ablation.

Finally, the lesions were created uneventfully for 90 seconds at 80
degrees Celsius. Afterwards 30 mg Depo-Medrol mixed with 0.5 mL of
1% lidocaine were administered at the ablation sites. The needles
were subsequently removed.

The procedure was well-tolerated. After the procedure, the patient
was able to ambulate without difficulty, leg weakness, or numbness.
IMPRESSION: 1. Technically successful bilateral L3-L4 facet radiofrequency
ablations.

## 2020-02-27 IMAGING — XA DG FACET JT NEURO DESTRUCT SING L/S W/ IMAG GUIDE
10 of 12 series · 12 of 15 positions shown · non-contrast
Comparison: none

CLINICAL DATA: Facet mediated low back pain. Excellent response to
bilateral L3-L4 facet medial branch blocks. Patient presents for
ablation.
TECHNIQUE: The procedure, risks, benefits, and alternatives were explained to
the patient. Questions regarding the procedure were encouraged and
answered. The patient understands and consents to the procedure.

[Series 1: ortho adipose · 1 of 1 slices shown (1 of 10)]
[im 1/1]
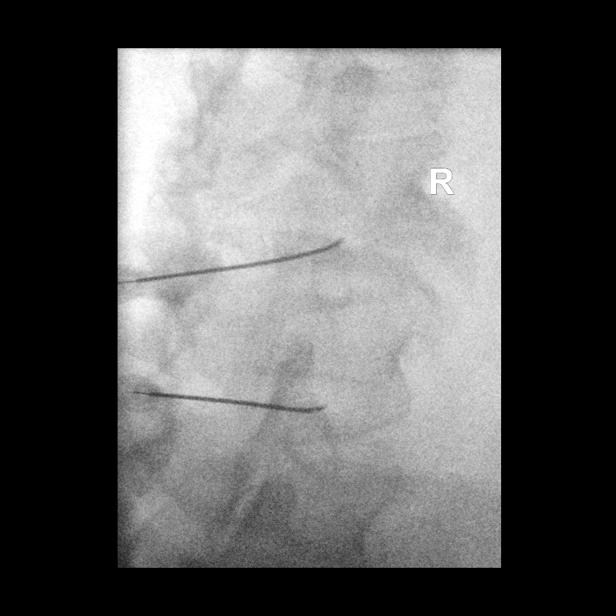

[Series 2: ortho adipose · 1 of 1 slices shown (2 of 10)]
[im 1/1]
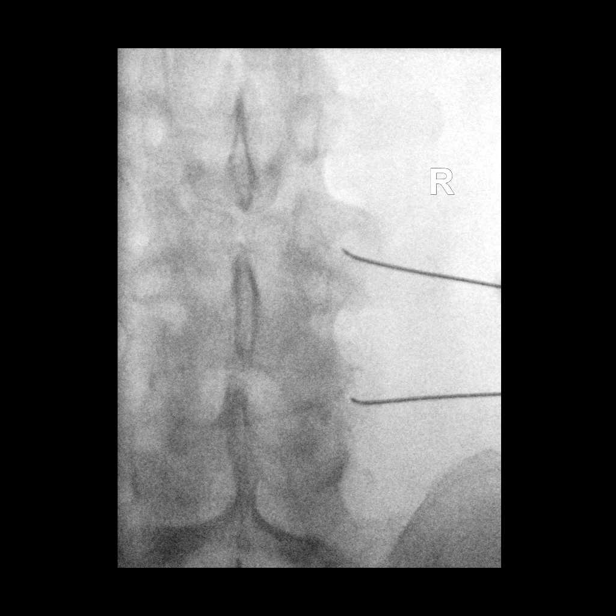

[Series 4: ortho adipose · 1 of 1 slices shown (3 of 10)]
[im 1/1]
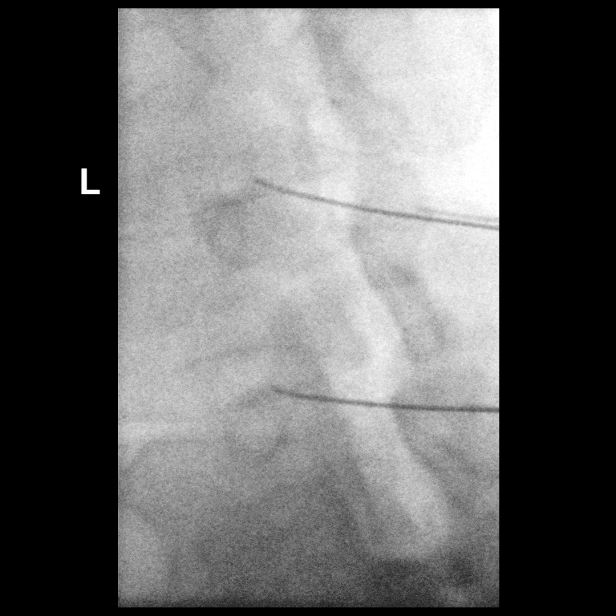

[Series 5: ortho adipose · 1 of 1 slices shown (4 of 10)]
[im 1/1]
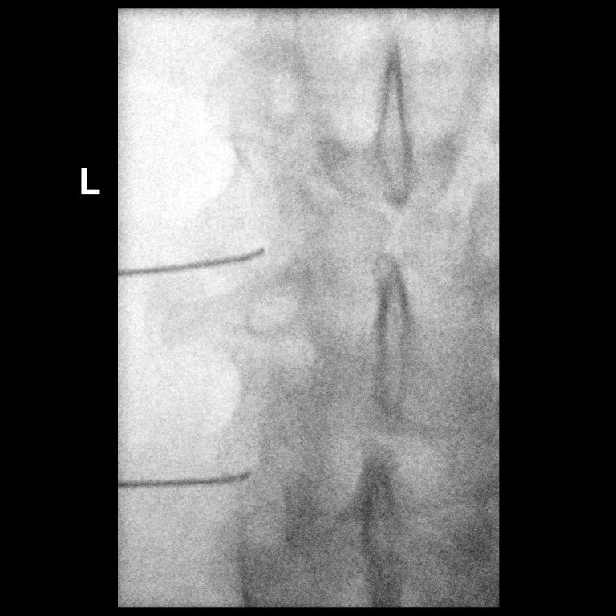

[Series 6: ortho adipose · 1 of 1 slices shown (5 of 10)]
[im 1/1]
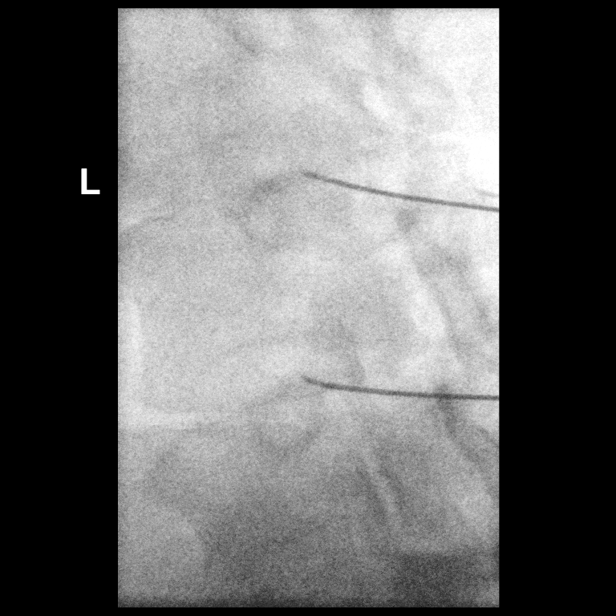

[Series 7: ortho adipose · 1 of 1 slices shown (6 of 10)]
[im 1/1]
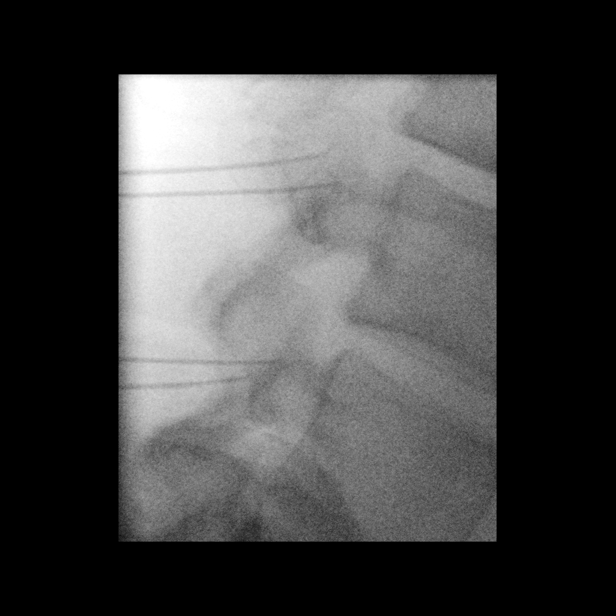

[Series 9: ortho adipose · 1 of 1 slices shown (7 of 10)]
[im 1/1]
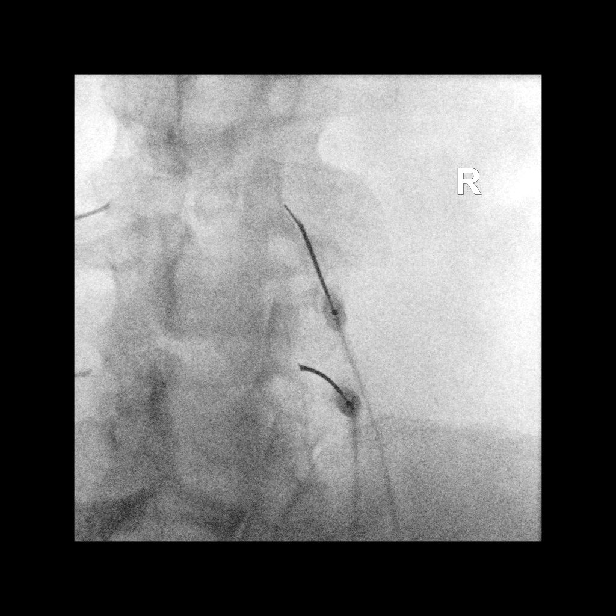

[Series 10: ortho adipose · 1 of 1 slices shown (8 of 10)]
[im 1/1]
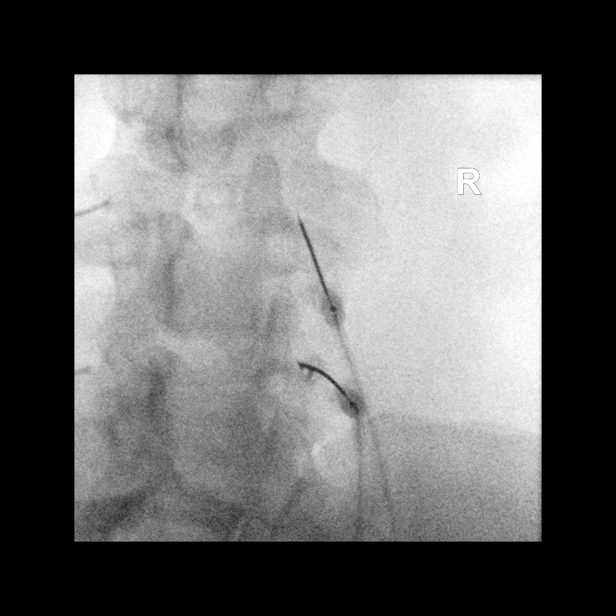

[Series 11: ortho adipose · 1 of 1 slices shown (9 of 10)]
[im 1/1]
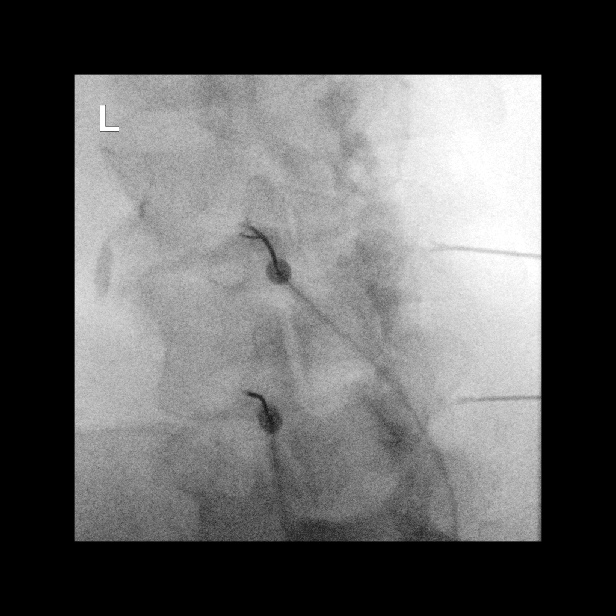

[Series 13: ortho adipose · 2 acquisitions, 3 frames shown (10 of 10)]
[im 1/2]
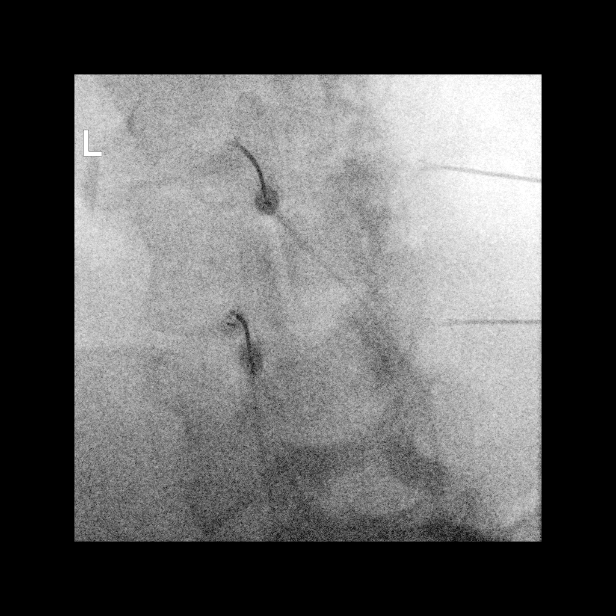
[im 1/2]
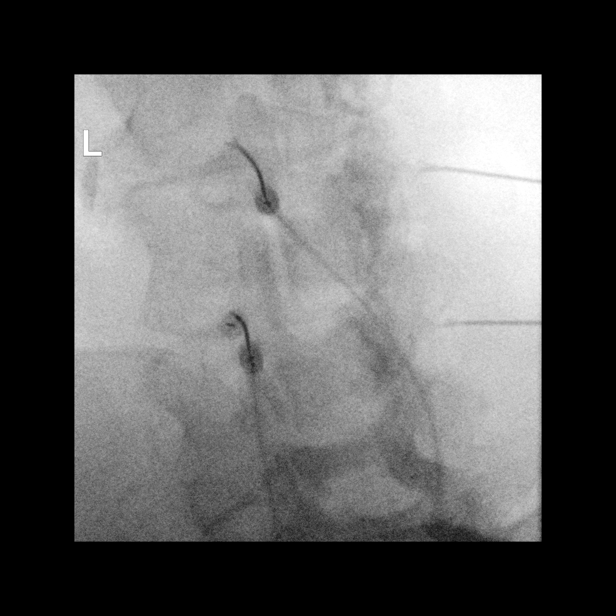
[im 2/2]
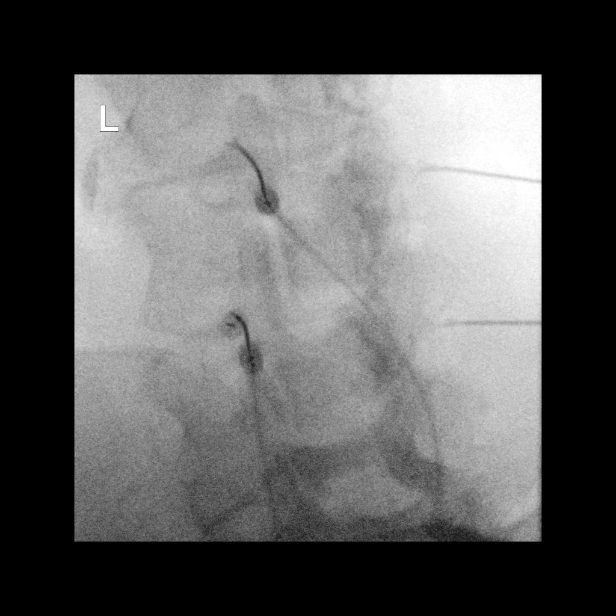

[12 of 15 positions shown; findings below may reference images not displayed]

EXAM:
FLUOROSCOPICALLY GUIDED FACET RADIOFREQUENCY ABLATION OF THE
BILATERAL L3-L4 FACET JOINTS

FLUOROSCOPY TIME:  Radiation Exposure Index (as provided by the
fluoroscopic device): 36.8 mGy

Fluoroscopy Time:  2 minutes, 28 seconds

Number of Acquired Images:  0

SEDATION:
Moderate (conscious) sedation was employed during this procedure. A
total of Versed 4 mg and Fentanyl 100 mcg were administered
intravenously. 30 mg Toradol were administered at the conclusion of
the procedure.

Moderate Sedation Time: 60 minutes. The patient's level of
consciousness and vital signs were monitored continuously by
radiology nursing throughout the procedure under my direct
supervision.
A grounding pad was placed on the right leg and confirmed visually.

An appropriate skin entry site was determined fluoroscopically and
marked. Site prepped with betadine, draped in usual sterile fashion,
and infiltrated locally with 1% lidocaine.

RIGHT L2 MEDIAL BRANCH RFA: A posterior oblique approach was taken
to the junction of the superior articular process and transverse
process on the right at L3 using a 20 gauge 15 cm [REDACTED] needle, to
lie along the course of the right L2 medial branch nerve. The 10 mm
active tip probe was placed through the needle, with subsequent
detection of low impedance. Sensory and motor stimulation elicited
concordant symptoms at low voltage, without lower extremity sensory
or motor symptoms. Using a tandem needle, 1 mL of 1% lidocaine was
placed around the probe tip to mitigate the acute symptoms of the
ablation.

RIGHT L3 MEDIAL BRANCH RFA: A posterior oblique approach was taken
to the junction of the superior articular process and transverse
process on the right at L4 using a 20 gauge 15 cm [REDACTED] needle, to
lie along the course of the right L3 medial branch nerve. The 10 mm
active tip probe was placed through the needle, with subsequent
detection of low impedance. Sensory and motor stimulation elicited
concordant symptoms at low voltage, without lower extremity sensory
or motor symptoms. Using a tandem needle, 1 mL of 1% lidocaine was
placed around the probe tip to mitigate the acute symptoms of the
ablation.

LEFT L2 MEDIAL BRANCH RFA: A posterior oblique approach was taken to
the junction of the superior articular process and transverse
process on the left at L3 using a 20 gauge 15 cm [REDACTED] needle, to
lie along the course of the left L2 medial branch nerve. The 10 mm
active tip probe was placed through the needle, with subsequent
detection of low impedance. Sensory and motor stimulation elicited
concordant symptoms at low voltage, without lower extremity sensory
or motor symptoms. Using a tandem needle, 1 mL of 1% lidocaine was
placed around the probe tip to mitigate the acute symptoms of the
ablation.

LEFT L3 MEDIAL BRANCH RFA: A posterior oblique approach was taken to
the junction of the superior articular process and transverse
process on the left at L4 using a 20 gauge 15 cm [REDACTED] needle, to
lie along the course of the left L3 medial branch nerve. The 10 mm
active tip probe was placed through the needle, with subsequent
detection of low impedance. Sensory and motor stimulation elicited
concordant symptoms at low voltage, without lower extremity sensory
or motor symptoms. Using a tandem needle, 1 mL of 1% lidocaine was
placed around the probe tip to mitigate the acute symptoms of the
ablation.

Finally, the lesions were created uneventfully for 90 seconds at 80
degrees Celsius. Afterwards 30 mg Depo-Medrol mixed with 0.5 mL of
1% lidocaine were administered at the ablation sites. The needles
were subsequently removed.

The procedure was well-tolerated. After the procedure, the patient
was able to ambulate without difficulty, leg weakness, or numbness.
IMPRESSION: 1. Technically successful bilateral L3-L4 facet radiofrequency
ablations.

## 2020-02-27 IMAGING — XA DG FACET JT NEURO DESTRUCT EA ADD L/S W/ IMAG GUIDE
10 of 12 series · 12 of 15 positions shown · non-contrast
Comparison: none

CLINICAL DATA: Facet mediated low back pain. Excellent response to
bilateral L3-L4 facet medial branch blocks. Patient presents for
ablation.
TECHNIQUE: The procedure, risks, benefits, and alternatives were explained to
the patient. Questions regarding the procedure were encouraged and
answered. The patient understands and consents to the procedure.

[Series 1: ortho adipose · 1 of 1 slices shown (1 of 10)]
[im 1/1]
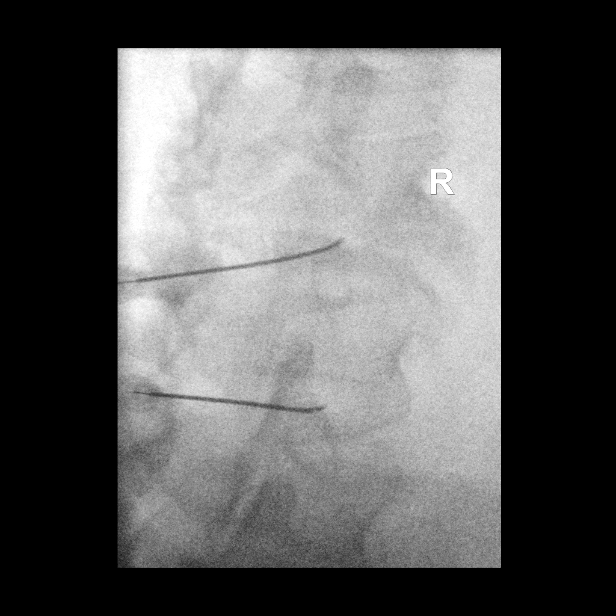

[Series 2: ortho adipose · 1 of 1 slices shown (2 of 10)]
[im 1/1]
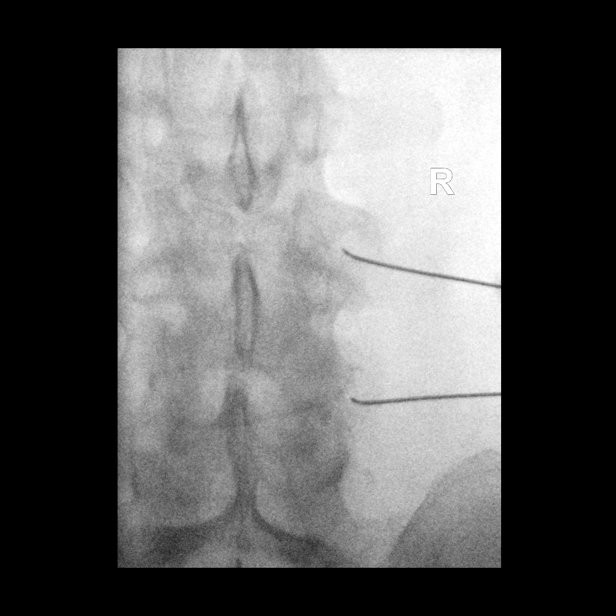

[Series 4: ortho adipose · 1 of 1 slices shown (3 of 10)]
[im 1/1]
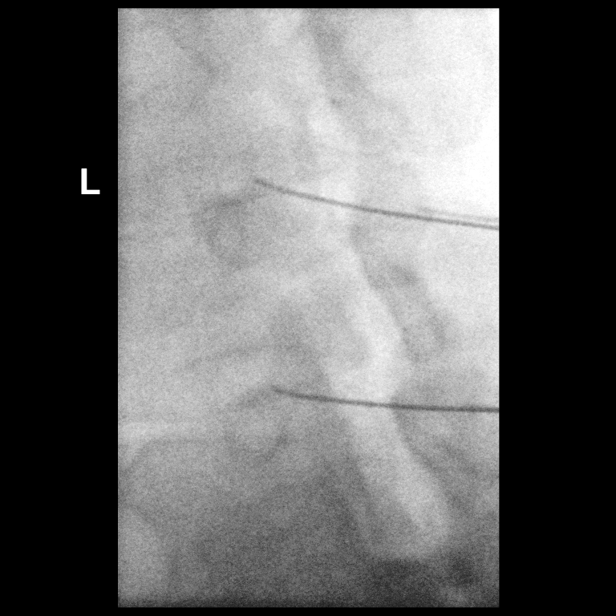

[Series 5: ortho adipose · 1 of 1 slices shown (4 of 10)]
[im 1/1]
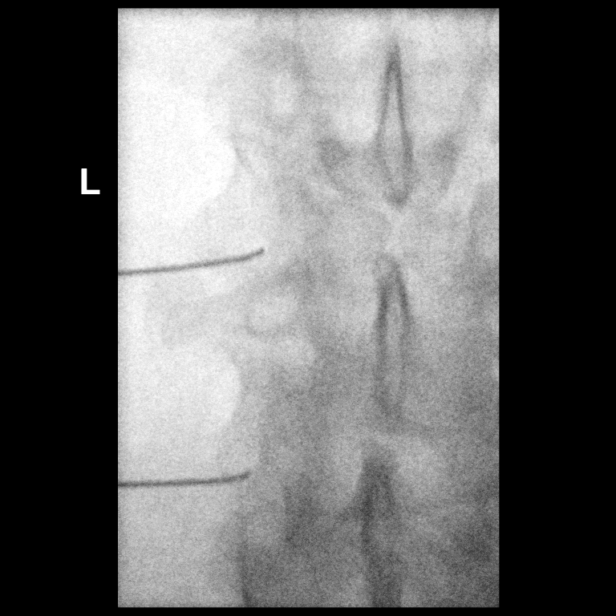

[Series 6: ortho adipose · 1 of 1 slices shown (5 of 10)]
[im 1/1]
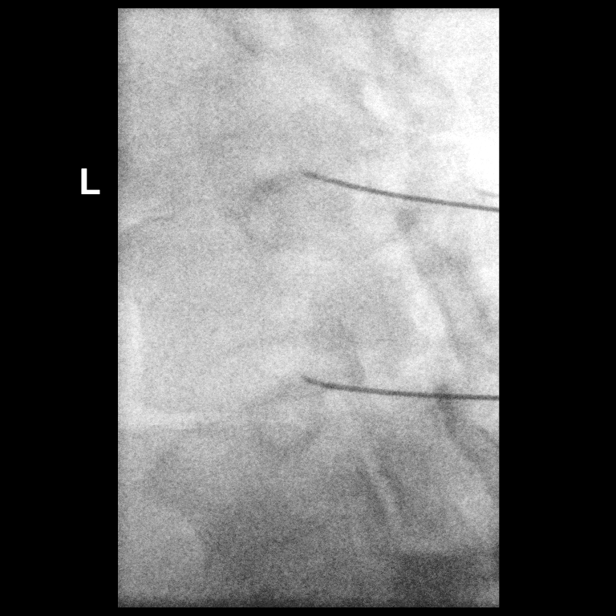

[Series 7: ortho adipose · 1 of 1 slices shown (6 of 10)]
[im 1/1]
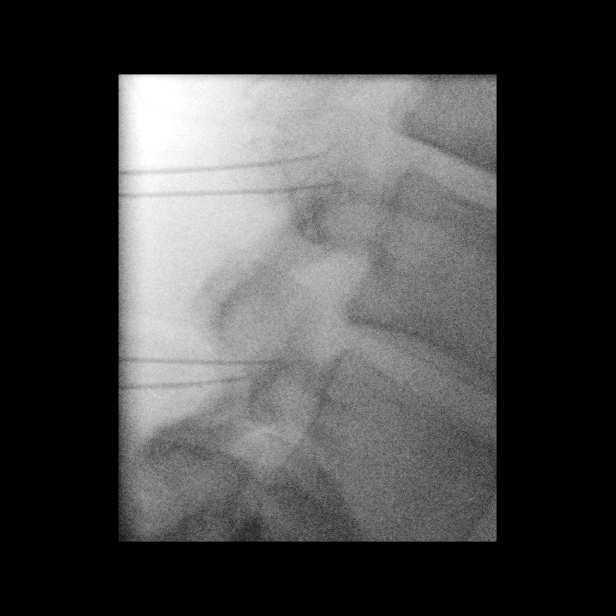

[Series 9: ortho adipose · 1 of 1 slices shown (7 of 10)]
[im 1/1]
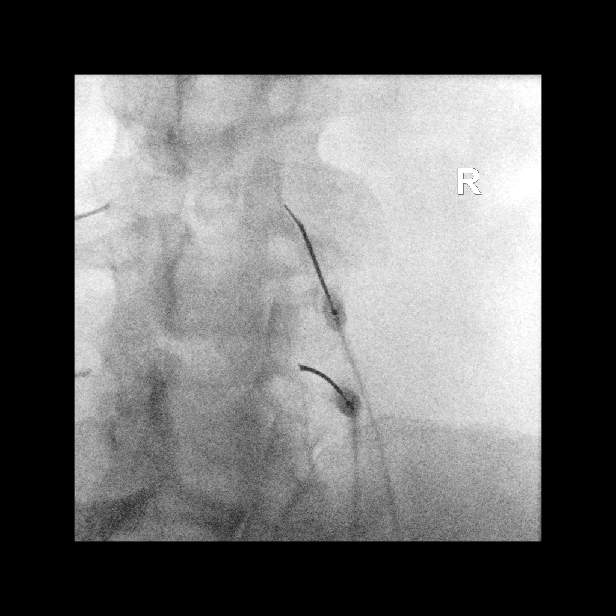

[Series 10: ortho adipose · 1 of 1 slices shown (8 of 10)]
[im 1/1]
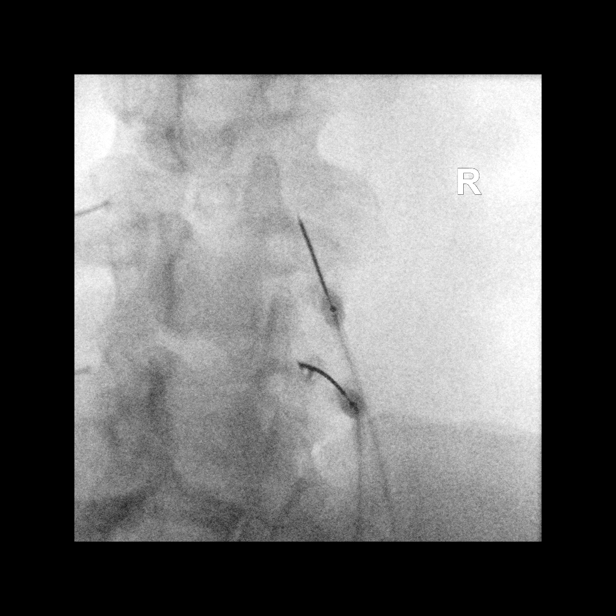

[Series 11: ortho adipose · 1 of 1 slices shown (9 of 10)]
[im 1/1]
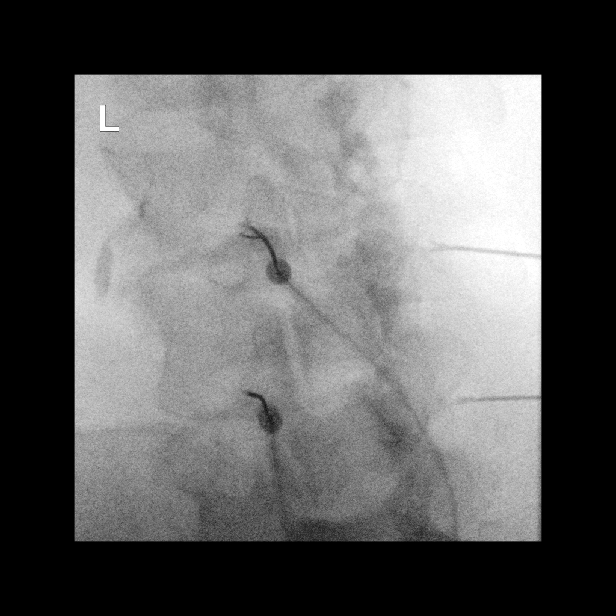

[Series 12: ortho adipose · 2 acquisitions, 3 frames shown (10 of 10)]
[im 1/2]
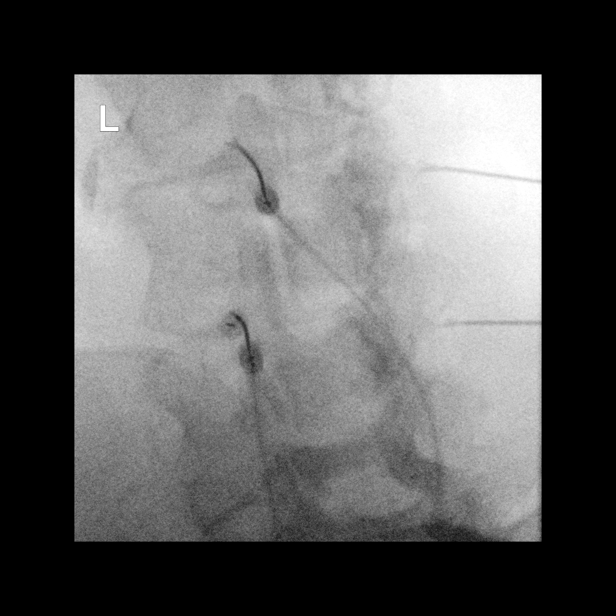
[im 2/2]
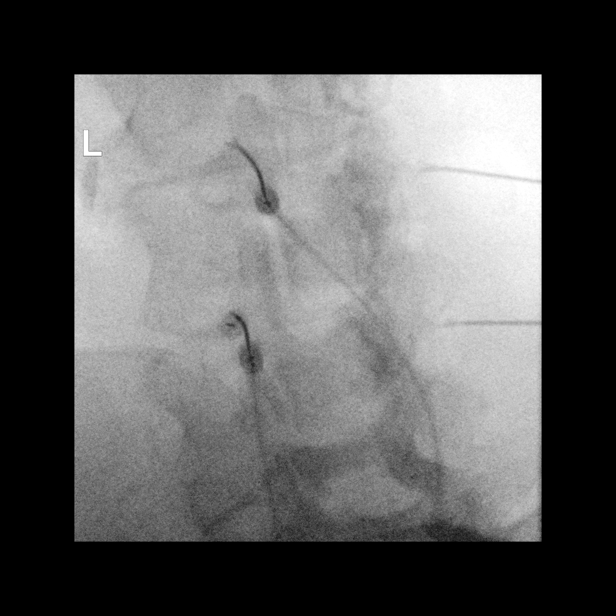
[im 2/2]
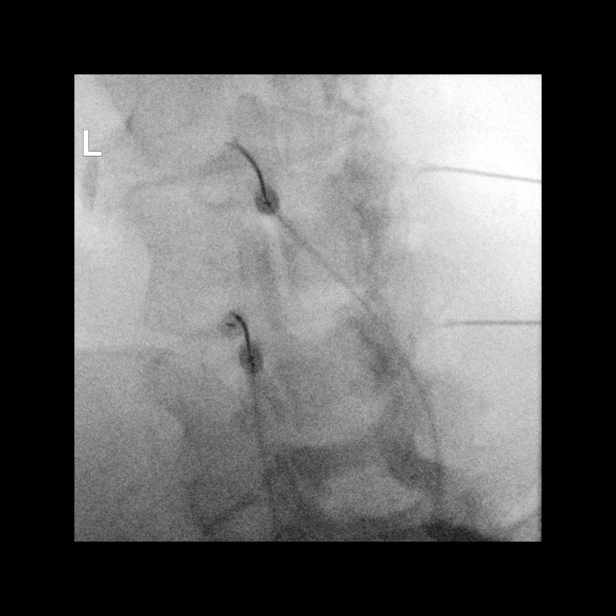

[12 of 15 positions shown; findings below may reference images not displayed]

EXAM:
FLUOROSCOPICALLY GUIDED FACET RADIOFREQUENCY ABLATION OF THE
BILATERAL L3-L4 FACET JOINTS

FLUOROSCOPY TIME:  Radiation Exposure Index (as provided by the
fluoroscopic device): 36.8 mGy

Fluoroscopy Time:  2 minutes, 28 seconds

Number of Acquired Images:  0

SEDATION:
Moderate (conscious) sedation was employed during this procedure. A
total of Versed 4 mg and Fentanyl 100 mcg were administered
intravenously. 30 mg Toradol were administered at the conclusion of
the procedure.

Moderate Sedation Time: 60 minutes. The patient's level of
consciousness and vital signs were monitored continuously by
radiology nursing throughout the procedure under my direct
supervision.
A grounding pad was placed on the right leg and confirmed visually.

An appropriate skin entry site was determined fluoroscopically and
marked. Site prepped with betadine, draped in usual sterile fashion,
and infiltrated locally with 1% lidocaine.

RIGHT L2 MEDIAL BRANCH RFA: A posterior oblique approach was taken
to the junction of the superior articular process and transverse
process on the right at L3 using a 20 gauge 15 cm [REDACTED] needle, to
lie along the course of the right L2 medial branch nerve. The 10 mm
active tip probe was placed through the needle, with subsequent
detection of low impedance. Sensory and motor stimulation elicited
concordant symptoms at low voltage, without lower extremity sensory
or motor symptoms. Using a tandem needle, 1 mL of 1% lidocaine was
placed around the probe tip to mitigate the acute symptoms of the
ablation.

RIGHT L3 MEDIAL BRANCH RFA: A posterior oblique approach was taken
to the junction of the superior articular process and transverse
process on the right at L4 using a 20 gauge 15 cm [REDACTED] needle, to
lie along the course of the right L3 medial branch nerve. The 10 mm
active tip probe was placed through the needle, with subsequent
detection of low impedance. Sensory and motor stimulation elicited
concordant symptoms at low voltage, without lower extremity sensory
or motor symptoms. Using a tandem needle, 1 mL of 1% lidocaine was
placed around the probe tip to mitigate the acute symptoms of the
ablation.

LEFT L2 MEDIAL BRANCH RFA: A posterior oblique approach was taken to
the junction of the superior articular process and transverse
process on the left at L3 using a 20 gauge 15 cm [REDACTED] needle, to
lie along the course of the left L2 medial branch nerve. The 10 mm
active tip probe was placed through the needle, with subsequent
detection of low impedance. Sensory and motor stimulation elicited
concordant symptoms at low voltage, without lower extremity sensory
or motor symptoms. Using a tandem needle, 1 mL of 1% lidocaine was
placed around the probe tip to mitigate the acute symptoms of the
ablation.

LEFT L3 MEDIAL BRANCH RFA: A posterior oblique approach was taken to
the junction of the superior articular process and transverse
process on the left at L4 using a 20 gauge 15 cm [REDACTED] needle, to
lie along the course of the left L3 medial branch nerve. The 10 mm
active tip probe was placed through the needle, with subsequent
detection of low impedance. Sensory and motor stimulation elicited
concordant symptoms at low voltage, without lower extremity sensory
or motor symptoms. Using a tandem needle, 1 mL of 1% lidocaine was
placed around the probe tip to mitigate the acute symptoms of the
ablation.

Finally, the lesions were created uneventfully for 90 seconds at 80
degrees Celsius. Afterwards 30 mg Depo-Medrol mixed with 0.5 mL of
1% lidocaine were administered at the ablation sites. The needles
were subsequently removed.

The procedure was well-tolerated. After the procedure, the patient
was able to ambulate without difficulty, leg weakness, or numbness.
IMPRESSION: 1. Technically successful bilateral L3-L4 facet radiofrequency
ablations.

## 2020-02-27 MED ORDER — METHYLPREDNISOLONE ACETATE 40 MG/ML INJ SUSP (RADIOLOG
120.0000 mg | Freq: Once | INTRAMUSCULAR | Status: AC
  Administered 2020-02-27: 120 mg via INTRA_ARTICULAR

## 2020-02-27 MED ORDER — MIDAZOLAM HCL 2 MG/2ML IJ SOLN
1.0000 mg | INTRAMUSCULAR | Status: DC | PRN
  Administered 2020-02-27 (×4): 1 mg via INTRAVENOUS

## 2020-02-27 MED ORDER — FENTANYL CITRATE (PF) 100 MCG/2ML IJ SOLN
25.0000 ug | INTRAMUSCULAR | Status: DC | PRN
  Administered 2020-02-27 (×2): 50 ug via INTRAVENOUS

## 2020-02-27 MED ORDER — SODIUM CHLORIDE 0.9 % IV SOLN: Freq: Once | INTRAVENOUS | Status: AC

## 2020-02-27 MED ORDER — KETOROLAC TROMETHAMINE 30 MG/ML IJ SOLN
30.0000 mg | Freq: Once | INTRAMUSCULAR | Status: AC
  Administered 2020-02-27: 30 mg via INTRAVENOUS

## 2020-02-27 NOTE — Progress Notes (Signed)
This set of vital signs actually was obtained at [phone]

## 2020-02-27 NOTE — Discharge Instructions (Signed)
Radiofrequency Ablation Discharge Instructions ° °1. After your radiofrequency ablation use ice to the affected area for the next 24 hours, as a temporary increase in pain is not uncommon for a day or two after your procedure. ° °2. Resume all medications. ° °3. Follow up with your ordering physician for post care. ° °4. If you have any of the following please call [phone]: ° °    Temperature greater than 101 °    Pain, redness or swelling at the injection site °

## 2020-03-02 MED FILL — VYVANSE 40 MG CHEW: 40 | 30 days supply | Qty: 30 | Fill #0

## 2020-03-05 ENCOUNTER — Ambulatory Visit: Payer: No Typology Code available for payment source | Admitting: Neurology

## 2020-03-07 NOTE — Progress Notes (Signed)
[facility] Clinic Note - Initial Visit   Date: 03/09/20  BORGHILD THAKER MRN: [id number] DOB: 10-25-1975   Dear Inda Coke, PA:  Thank you for your kind referral of Crystal Madden for consultation of right side numbness. Although her history is well known to you, please allow [place] to reiterate it for the purpose of our medical record. The patient was accompanied to the clinic by self.    History of Present Illness: Crystal Madden is a 44 y.o. right-handed female with anxiety, diabetes mellitus, hypertension, and lumbar spondylosis s/p surgery (by Dr. Saintclair Halsted) presenting for evaluation of right sided numbness.   In late February 2021, she was in her kitchen and developed sudden onset of numbness and weakness in the right hand.  She recalls being unable to open the package of cheese because of weakness in the grip.  This lasted about 45 minutes and then resolved.  Symptoms occurred again about a week later with right arm and leg numbness, there was no associated weakness.  Second episode lasted about 30 minutes.  Due to concern of TIA and stroke, she went to the emergency department where MRI brain was normal.  She has also had MRI cervical spine which shows cervical spondylosis with congenitally narrow cervical spinal canal and multilevel foraminal stenosis.  She has chronic low back pain and has history of lumbar surgery by Dr. Saintclair Halsted.  MRI lumbar spine from March 2021 shows multifactorial degenerative changes with canal stenosis at L2-3, and postop changes at L3-4.  She has tried physical therapy and gets epidural steroid injections for low back pain.    She works at [employer] in [employer].   Out-side paper records, electronic medical record, and images have been reviewed where available and summarized as:  MRI brain wo contrast 10/19/2019: Normal brain MRI.  No acute intracranial abnormality identified.  MRI lumbar spine 10/29/2019: 1.  Multifactorial degenerative changes at L2-3 with resultant moderate canal with left greater than right lateral recess stenosis, similar to previous. 2. Degenerative disc bulge with superimposed left foraminal disc protrusion at L3-4, contacting the exiting left L3 nerve. 3. Postoperative changes from prior left hemi laminectomy and micro discectomy at L3-4 and L4-5 with small residual left subarticular disc protrusion at L4-5. Residual mild to moderate left lateral recess narrowing at these levels is relatively unchanged.  MRI cervical spine wo contrast 10/29/2019: Cervical spondylosis, as outlined and superimposed upon a congenitally narrow cervical spinal canal. No more than mild degenerative spinal canal narrowing at any level.  Multilevel neural foraminal narrowing as described and greatest on the right at C3-C4 where right-sided disc osteophyte ridge contributes to moderate/severe foraminal stenosis. Correlate for right C4 radiculopathy.  Lab Results  Component Value Date   HGBA1C 5.4 10/04/2019   Lab Results  Component Value Date   UVOZDGUY40 347 10/04/2019   Lab Results  Component Value Date   TSH 3.290 10/04/2019   No results found for: ESRSEDRATE, POCTSEDRATE  Past Medical History:  Diagnosis Date  . Anxiety   . Depression   . Diabetes mellitus without complication ([place])   . High blood pressure   . Hyperlipidemia   . Lower back pain   . Obesity (BMI 30-39.9)     Past Surgical History:  Procedure Laterality Date  . BLADDER SUSPENSION    . CESAREAN SECTION    . LUMBAR MICRODISCECTOMY  2018  . TONSILLECTOMY  2000     Medications:  Outpatient Encounter Medications as of 03/09/2020  Medication Sig  . ALPRAZolam (XANAX) 0.5 MG tablet TAKE 1 TABLET BY MOUTH IN THE AM AND 1/2 TABLET IN THE AFTERNOON (Patient taking differently: Take 0.5 mg by mouth daily as needed for anxiety. )  . AMBULATORY NON FORMULARY MEDICATION Single glucometer with lancets, test strips. Test  daily R73.03. Prediabetes  . bimatoprost (LATISSE) 0.03 % ophthalmic solution PUT 1 DROP ON APPLICATOR &APPLY EVENLY ALONG SKIN OF UPPER LID AT BASE OF LASHES TO BOTH EYES AT BED (Patient taking differently: Place 1 drop into both eyes See admin instructions. PLACE 1 DROP ONTO APPLICATOR & APPLY EVENLY ALONG SKIN OF UPPER LID AT BASE OF LASHES TO BOTH EYES AT BEDTIME)  . blood glucose meter kit and supplies Used to check blood sugars daily  . glucose blood test strip Used to check blood sugars daily  . Lancets MISC Used to check blood sugars daily  . naproxen (NAPROSYN) 500 MG tablet Take 1 tablet (500 mg total) by mouth 2 (two) times daily with a meal. (Patient taking differently: Take 500 mg by mouth 2 (two) times daily as needed (for pain). )  . norethindrone (NORLYDA) 0.35 MG tablet Take 1 tablet by mouth daily.  . valsartan-hydrochlorothiazide (DIOVAN-HCT) 80-12.5 MG tablet Take 1 tablet by mouth daily.  . Vitamin D, Ergocalciferol, (DRISDOL) 1.25 MG (50000 UNIT) CAPS capsule Take 1 capsule (50,000 Units total) by mouth every 7 (seven) days.  Marland Kitchen VYVANSE 40 MG CHEW Chew 1 tablet by mouth daily.  . metFORMIN (GLUCOPHAGE) 500 MG tablet Take 1 tablet (500 mg total) by mouth every morning. (Patient not taking: Reported on 03/09/2020)   No facility-administered encounter medications on file as of 03/09/2020.    Allergies:  Allergies  Allergen Reactions  . Amlodipine Swelling    Lower leg swelling when increased to 10 mg    Family History: Family History  Problem Relation Age of Onset  . Hypertension Mother   . Hyperlipidemia Mother   . Obesity Mother   . COPD Father   . Cancer Father   . Heart disease Father   . Hypertension Father   . Stroke Father   . Cancer Maternal Grandfather   . Cancer Paternal Grandmother   . Colon cancer Neg Hx     Social History: Social History   Tobacco Use  . Smoking status: Former Smoker    Quit date: 08/27/2004    Years since quitting: 15.5  .  Smokeless tobacco: Never Used  Vaping Use  . Vaping Use: Never used  Substance Use Topics  . Alcohol use: Yes    Comment: Ocassional.  . Drug use: No   Social History   Social History Narrative   right handed    Vital Signs:  BP (!) 134/88   Pulse 103   Ht 5' 6"  (1.676 m)   Wt (!) 218 lb 9.6 oz (99.2 kg)   SpO2 100%   BMI 35.28 kg/m    General Medical Exam:   General:  Well appearing, comfortable.   Eyes/ENT: see cranial nerve examination.   Neck:   No carotid bruits. Respiratory:  Clear to auscultation, good air entry bilaterally.   Cardiac:  Regular rate and rhythm, no murmur.   Extremities:  No deformities, edema, or skin discoloration.  Skin:  No rashes or lesions.  Neurological Exam: MENTAL STATUS including orientation to time, place, person, recent and remote memory, attention span and concentration, language, and fund of knowledge is normal.  Speech is not dysarthric.  CRANIAL NERVES:  II:  No visual field defects.     III-IV-VI: Pupils equal round and reactive to light.  Normal conjugate, extra-ocular eye movements in all directions of gaze.  No nystagmus.  No ptosis.  Marland Kitchen    VII:  Normal facial symmetry and movements.   VIII:  Normal hearing and vestibular function.   XI:  Normal shoulder shrug and head rotation.    MOTOR:  No atrophy, fasciculations or abnormal movements.  No pronator drift.   Upper Extremity:  Right  Left  Deltoid  5/5   5/5   Biceps  5/5   5/5   Triceps  5/5   5/5   Infraspinatus 5/5  5/5  Medial pectoralis 5/5  5/5  Wrist extensors  5/5   5/5   Wrist flexors  5/5   5/5   Finger extensors  5/5   5/5   Finger flexors  5/5   5/5   Dorsal interossei  5/5   5/5   Abductor pollicis  5/5   5/5   Tone (Ashworth scale)  0  0   Lower Extremity:  Right  Left  Hip flexors  5/5   5/5   Hip extensors  5/5   5/5   Adductor 5/5  5/5  Abductor 5/5  5/5  Knee flexors  5/5   5/5   Knee extensors  5/5   5/5   Dorsiflexors  5/5   5/5     Plantarflexors  5/5   5/5   Toe extensors  5/5   5/5   Toe flexors  5/5   5/5   Tone (Ashworth scale)  0  0   MSRs:  Right        Left                  brachioradialis 2+  2+  biceps 2+  2+  triceps 2+  2+  patellar 2+  2+  ankle jerk 2+  2+  Hoffman no  no  plantar response down  down   SENSORY:  Normal and symmetric perception of light touch, pinprick, vibration, and proprioception.   COORDINATION/GAIT: Normal finger-to- nose-finger.  Intact rapid alternating movements bilaterally.  Able to rise from a chair without using arms.  Gait narrow based and stable. Tandem and stressed gait intact.    IMPRESSION: 1. Right side paresthesias due to cervical spondylosis with radiculopathy and canal stenosis.  Imaging was personally viewed with patient.  Given the degenerative changes in her cervical spine, she remains at risk to develop symptoms and therefore recommend optimizing neck posture and structure.  Patient reassured that there is nothing abnormal on MRI brain to explain right-sided paresthesias.  -Symptoms overall has improved and she no longer has numbness involving the right arm and leg  -Referred to neck PT for neck stretching and strengthening   2. Lumbar spondylosis with canal stenosis at L2-3, history of prior lumbar surgery at L3-4  -She is followed by Dr. Georgina Snell and gets ESI as needed  Return to clinic as needed   Thank you for allowing me to participate in patient's care.  If I can answer any additional questions, I would be pleased to do so.    Sincerely,    Kasiah Manka K. Posey Pronto, DO

## 2020-03-09 ENCOUNTER — Other Ambulatory Visit: Payer: Self-pay

## 2020-03-09 ENCOUNTER — Ambulatory Visit (INDEPENDENT_AMBULATORY_CARE_PROVIDER_SITE_OTHER): Payer: No Typology Code available for payment source | Admitting: Neurology

## 2020-03-09 ENCOUNTER — Encounter: Payer: Self-pay | Admitting: Neurology

## 2020-03-09 VITALS — BP 134/88 | HR 103 | Ht 66.0 in | Wt 218.6 lb

## 2020-03-09 DIAGNOSIS — M5010 Cervical disc disorder with radiculopathy, unspecified cervical region: Secondary | ICD-10-CM | POA: Diagnosis not present

## 2020-03-09 NOTE — Patient Instructions (Addendum)
Start neck physiotherapy  Return to clinic as needed

## 2020-03-30 ENCOUNTER — Other Ambulatory Visit: Payer: No Typology Code available for payment source

## 2020-04-02 MED FILL — VYVANSE 40 MG CHEW: 40 | 30 days supply | Qty: 30 | Fill #0

## 2020-04-10 ENCOUNTER — Other Ambulatory Visit: Payer: Self-pay | Admitting: Physician Assistant

## 2020-04-10 ENCOUNTER — Encounter: Payer: Self-pay | Admitting: Physician Assistant

## 2020-04-10 DIAGNOSIS — Z1283 Encounter for screening for malignant neoplasm of skin: Secondary | ICD-10-CM

## 2020-04-10 DIAGNOSIS — M542 Cervicalgia: Secondary | ICD-10-CM

## 2020-04-13 ENCOUNTER — Other Ambulatory Visit: Payer: Self-pay | Admitting: Physician Assistant

## 2020-04-13 MED ORDER — VALSARTAN-HYDROCHLOROTHIAZIDE 80-12.5 MG PO TABS: 1.0000 | ORAL_TABLET | Freq: Every day | ORAL | 0 refills | Status: DC

## 2020-04-13 MED FILL — VALSARTAN-HCTZ 80-12.5 MG T: 80-12.5 | 90 days supply | Qty: 90 | Fill #0

## 2020-04-20 ENCOUNTER — Other Ambulatory Visit: Payer: Self-pay | Admitting: Family Medicine

## 2020-05-02 ENCOUNTER — Other Ambulatory Visit: Payer: Self-pay | Admitting: Physician Assistant

## 2020-05-02 MED FILL — VYVANSE 40 MG CHEW: 40 | 30 days supply | Qty: 30 | Fill #0

## 2020-05-02 NOTE — Telephone Encounter (Signed)
Pt requesting refill. Has not been filled by you prior Dr. Juleen China.

## 2020-05-11 ENCOUNTER — Encounter: Payer: Self-pay | Admitting: Physician Assistant

## 2020-05-11 DIAGNOSIS — M5010 Cervical disc disorder with radiculopathy, unspecified cervical region: Secondary | ICD-10-CM

## 2020-05-11 MED FILL — NORLYDA 0.35 MG TABS: 0.35 | 84 days supply | Qty: 84 | Fill #4

## 2020-05-18 ENCOUNTER — Other Ambulatory Visit (HOSPITAL_COMMUNITY): Payer: Self-pay | Admitting: Physician Assistant

## 2020-05-29 MED FILL — VYVANSE 40 MG CHEW: 40 | 30 days supply | Qty: 30 | Fill #0

## 2020-06-13 ENCOUNTER — Encounter: Payer: Self-pay | Admitting: Physical Therapy

## 2020-06-13 ENCOUNTER — Other Ambulatory Visit: Payer: Self-pay

## 2020-06-13 ENCOUNTER — Ambulatory Visit: Payer: No Typology Code available for payment source | Admitting: Physical Therapy

## 2020-06-13 DIAGNOSIS — M5412 Radiculopathy, cervical region: Secondary | ICD-10-CM | POA: Diagnosis not present

## 2020-06-27 ENCOUNTER — Other Ambulatory Visit: Payer: Self-pay

## 2020-06-27 ENCOUNTER — Ambulatory Visit (INDEPENDENT_AMBULATORY_CARE_PROVIDER_SITE_OTHER): Payer: No Typology Code available for payment source | Admitting: Physical Therapy

## 2020-06-27 DIAGNOSIS — M5412 Radiculopathy, cervical region: Secondary | ICD-10-CM | POA: Diagnosis not present

## 2020-06-29 MED FILL — VYVANSE 40 MG CHEW: 40 | 30 days supply | Qty: 30 | Fill #0

## 2020-07-04 ENCOUNTER — Ambulatory Visit: Payer: No Typology Code available for payment source | Admitting: Physical Therapy

## 2020-07-04 ENCOUNTER — Other Ambulatory Visit: Payer: Self-pay

## 2020-07-04 DIAGNOSIS — M5412 Radiculopathy, cervical region: Secondary | ICD-10-CM | POA: Diagnosis not present

## 2020-07-09 ENCOUNTER — Other Ambulatory Visit (HOSPITAL_COMMUNITY): Payer: Self-pay | Admitting: Physician Assistant

## 2020-07-11 ENCOUNTER — Ambulatory Visit: Payer: No Typology Code available for payment source | Admitting: Physical Therapy

## 2020-07-11 ENCOUNTER — Other Ambulatory Visit: Payer: Self-pay

## 2020-07-11 ENCOUNTER — Encounter: Payer: Self-pay | Admitting: Physical Therapy

## 2020-07-11 DIAGNOSIS — M5412 Radiculopathy, cervical region: Secondary | ICD-10-CM | POA: Diagnosis not present

## 2020-07-11 NOTE — Therapy (Signed)
[facility] [address] [place], [place], [address] Phone: [phone]   Fax:  [phone]  Physical Therapy Evaluation  Patient Details  Name: Crystal Madden MRN: [id number] Date of Birth: 09/28/1975 Referring Provider (PT): Inda Coke   Encounter Date: 06/13/2020   PT End of Session - 07/11/20 1057    Visit Number 1    Number of Visits 12    Date for PT Re-Evaluation 07/25/20    Authorization Type [facility]    PT Start Time 0800    PT Stop Time 0845    PT Time Calculation (min) 45 min    Activity Tolerance Patient tolerated treatment well    Behavior During Therapy [facility] for tasks assessed/performed           Past Medical History:  Diagnosis Date   Anxiety    Depression    Diabetes mellitus without complication ([place])    High blood pressure    Hyperlipidemia    Lower back pain    Obesity (BMI 30-39.9)     Past Surgical History:  Procedure Laterality Date   BLADDER SUSPENSION     CESAREAN SECTION     LUMBAR MICRODISCECTOMY  2018   TONSILLECTOMY  2000    There were no vitals filed for this visit.    Subjective Assessment - 07/11/20 1050    Subjective Pt states new onset of pain in neck and tingling into R UE into hand. She is R handed. Has tightness in R UT region as well. Feels hand is weak when holding things. Works from home for [employer].    Currently in Pain? Yes    Pain Score 4     Pain Location Neck    Pain Orientation Right    Pain Descriptors / Indicators Aching;Tightness    Pain Type Acute pain    Pain Onset More than a month ago    Pain Frequency Intermittent    Pain Score 6    Pain Location Arm    Pain Orientation Right    Pain Descriptors / Indicators Radiating;Tingling    Pain Type Acute pain    Pain Onset More than a month ago    Pain Frequency Intermittent              [facility] PT Assessment - 07/11/20 0001      Assessment   Medical Diagnosis Cervial Radiculopathy      Referring Provider (PT) Inda Coke    Hand Dominance Right    Prior Therapy for back      Balance Screen   Has the patient fallen in the past 6 months No      Prior Function   Level of Independence Independent      Cognition   Overall Cognitive Status Within Functional Limits for tasks assessed      Strength   Strength Assessment Site Hand    Right/Left hand Right;Left    Right Hand Grip (lbs) 50    Left Hand Grip (lbs) 65      Palpation   Palpation comment hypertonicity and soreness in R UT, mild into R parspinals.       Special Tests   Other special tests Mild increase in pain/ + R ULTT                      Objective measurements completed on examination: See above findings.       [facility] Adult PT Treatment/Exercise - 07/11/20  0001      Exercises   Exercises Neck      Neck Exercises: Theraband   Scapula Retraction 20 reps      Manual Therapy   Manual therapy comments skilled palpation and monitoring of soft tissue with dry needling     Soft tissue mobilization R UT STM/DTM    Manual Traction cervical 10 sec x 8       Neck Exercises: Stretches   Upper Trapezius Stretch 2 reps;30 seconds;Right;Left    Other Neck Stretches Median and ulnar nerve glides x 10 ea; seated            Trigger Point Dry Needling - 07/11/20 0001    Consent Given? Yes    Education Handout Provided Previously provided    Muscles Treated Head and Neck Upper trapezius    Upper Trapezius Response Twitch reponse elicited;Palpable increased muscle length   R                 PT Short Term Goals - 07/11/20 1058      PT SHORT TERM GOAL #1   Title Pt to be independent with initial HEP    Time 2    Period Weeks    Status New    Target Date 06/27/20             PT Long Term Goals - 07/11/20 1058      PT LONG TERM GOAL #1   Title Pt to be independent with final HEP    Time 6    Period Weeks    Status New    Target Date 07/25/20      PT LONG TERM  GOAL #2   Title Pt to report decreased pain in neck and R UE to 0-1/10 with activity    Time 6    Period Weeks    Status New    Target Date 07/25/20      PT LONG TERM GOAL #3   Title Pt to demo soft tissue restrictions in UT region to be WNL for decreased pain .    Time 6    Period Weeks    Status New    Target Date 07/25/20      PT LONG TERM GOAL #4   Title Pt to report full use of R UE without deficit    Time 6    Period Weeks    Status New    Target Date 07/25/20                  Plan - 07/11/20 1102    Clinical Impression Statement Pt presents with primary complaint of increased pain in cervical region and R UE. Symptoms consistent with cervical radiculopathy. Pt does have mild weakness in R hand vs L,with increased tingling/numbness into R UE and fingers. She also has increased muscle tension and pain in R UT region, and decreased ability for cervical and UE activity due to pain. Pt with decreased abilty for full functional activities and work duties with UE. Pt to benefit from skiled PT to improve deficits and pain.    Examination-Activity Limitations Lift;Carry;Caring for Others    Examination-Participation Restrictions Meal Prep;Cleaning;Community Activity;Driving;Yard Work;Occupation    Stability/Clinical Decision Making Stable/Uncomplicated    Clinical Decision Making Low    Rehab Potential Good    PT Frequency 2x / week    PT Duration 6 weeks    PT Treatment/Interventions ADLs/Self Care Home Management;Cryotherapy;Electrical Stimulation;DME Instruction;Ultrasound;Traction;Moist Heat;Iontophoresis 4mg /ml Dexamethasone;Functional mobility  training;Therapeutic activities;Therapeutic exercise;Neuromuscular re-education;Manual techniques;Patient/family education;Passive range of motion;Dry needling;Joint Manipulations;Spinal Manipulations;Taping    Consulted and Agree with Plan of Care Patient           Patient will benefit from skilled therapeutic intervention in  order to improve the following deficits and impairments:  Decreased range of motion, Increased muscle spasms, Decreased activity tolerance, Pain, Decreased mobility, Decreased strength, Impaired UE functional use  Visit Diagnosis: Radiculopathy, cervical region     Problem List Patient Active Problem List   Diagnosis Date Noted   Cervical disc disorder with radiculopathy 03/09/2020   Insulin resistance 12/29/2019   Prediabetes 11/02/2019   Hypertension 06/14/2019   Vitamin D deficiency 08/28/2018   Class 1 obesity with serious comorbidity and body mass index (BMI) of 34.0 to 34.9 in adult 08/27/2018   Attention deficit hyperactivity disorder (ADHD), combined type 08/27/2018   Diverticulitis of sigmoid colon 07/22/2016   Lyndee Hensen, PT, DPT 11:13 AM  07/11/20    [facility] [place], [place], [address] Phone: [phone]   Fax:  [phone]  Name: ZYAIRA VEJAR MRN: [id number] Date of Birth: October 19, 1975

## 2020-07-11 NOTE — Therapy (Signed)
[facility] [address] [place], [place], [address] Phone: [phone]   Fax:  [phone]  Physical Therapy Treatment  Patient Details  Name: Crystal Madden MRN: [id number] Date of Birth: [birth year] Referring Provider (PT): Inda Coke   Encounter Date: 06/27/2020   PT End of Session - 07/11/20 1117    Visit Number 2    Number of Visits 12    Date for PT Re-Evaluation 07/25/20    Authorization Type [facility]    PT Start Time 0805    PT Stop Time 0852    PT Time Calculation (min) 47 min    Activity Tolerance Patient tolerated treatment well    Behavior During Therapy [facility] for tasks assessed/performed           Past Medical History:  Diagnosis Date   Anxiety    Depression    Diabetes mellitus without complication ([place])    High blood pressure    Hyperlipidemia    Lower back pain    Obesity (BMI 30-39.9)     Past Surgical History:  Procedure Laterality Date   BLADDER SUSPENSION     CESAREAN SECTION     LUMBAR MICRODISCECTOMY  2018   TONSILLECTOMY  2000    There were no vitals filed for this visit.   Subjective Assessment - 07/11/20 1116    Subjective Pt states continued numbness in R UE. Tightness in R UT somewhat improved from Dry needling.    Currently in Pain? Yes    Pain Score 2     Pain Location Neck    Pain Orientation Right    Pain Descriptors / Indicators Tightness    Pain Type Acute pain    Pain Onset More than a month ago    Pain Frequency Intermittent    Pain Score 4    Pain Location Arm    Pain Orientation Right    Pain Descriptors / Indicators Numbness    Pain Type Acute pain    Pain Onset More than a month ago    Pain Frequency Intermittent                             [facility] Adult PT Treatment/Exercise - 07/11/20 1120      Exercises   Exercises Neck      Neck Exercises: Theraband   Scapula Retraction --    Other Theraband Exercises Rows GTB x  20;     Other Theraband Exercises BIl ER RTB x 15;       Neck Exercises: Standing   Other Standing Exercises UE nerve glides ulnar and med x 10 ea;       Neck Exercises: Supine   Other Supine Exercise Supine pec stretch 30 sec x 3;       Modalities   Modalities Traction      Traction   Type of Traction Cervical    Min (lbs) 12    Max (lbs) 15    Time 13, 5 min holds x 2;       Manual Therapy   Manual therapy comments --    Soft tissue mobilization  STM/DTM for bil UT, cervical paraspinals; SO;     Passive ROM Manual stretching for bil UTs     Manual Traction --      Neck Exercises: Stretches   Upper Trapezius Stretch 2 reps;30 seconds;Right;Left    Levator Stretch 2 reps;30 seconds;Right;Left  Other Neck Stretches --                    PT Short Term Goals - 07/11/20 1058      PT SHORT TERM GOAL #1   Title Pt to be independent with initial HEP    Time 2    Period Weeks    Status New    Target Date 06/27/20             PT Long Term Goals - 07/11/20 1058      PT LONG TERM GOAL #1   Title Pt to be independent with final HEP    Time 6    Period Weeks    Status New    Target Date 07/25/20      PT LONG TERM GOAL #2   Title Pt to report decreased pain in neck and R UE to 0-1/10 with activity    Time 6    Period Weeks    Status New    Target Date 07/25/20      PT LONG TERM GOAL #3   Title Pt to demo soft tissue restrictions in UT region to be WNL for decreased pain .    Time 6    Period Weeks    Status New    Target Date 07/25/20      PT LONG TERM GOAL #4   Title Pt to report full use of R UE without deficit    Time 6    Period Weeks    Status New    Target Date 07/25/20                 Plan - 07/11/20 1119    Clinical Impression Statement Pt with continued muscle tension in R UT, but decreased from previously. Addressed with STM/DTM. Added cervical traction for UE radiculopathy. Reviewed HEP for Cervical stretching, UE nerve  glides, and posture.    Examination-Activity Limitations Lift;Carry;Caring for Others    Examination-Participation Restrictions Meal Prep;Cleaning;Community Activity;Driving;Yard Work;Occupation    Stability/Clinical Decision Making Stable/Uncomplicated    Rehab Potential Good    PT Frequency 2x / week    PT Duration 6 weeks    PT Treatment/Interventions ADLs/Self Care Home Management;Cryotherapy;Electrical Stimulation;DME Instruction;Ultrasound;Traction;Moist Heat;Iontophoresis 4mg /ml Dexamethasone;Functional mobility training;Therapeutic activities;Therapeutic exercise;Neuromuscular re-education;Manual techniques;Patient/family education;Passive range of motion;Dry needling;Joint Manipulations;Spinal Manipulations;Taping    Consulted and Agree with Plan of Care Patient           Patient will benefit from skilled therapeutic intervention in order to improve the following deficits and impairments:  Decreased range of motion, Increased muscle spasms, Decreased activity tolerance, Pain, Decreased mobility, Decreased strength, Impaired UE functional use  Visit Diagnosis: Radiculopathy, cervical region     Problem List Patient Active Problem List   Diagnosis Date Noted   Cervical disc disorder with radiculopathy 03/09/2020   Insulin resistance 12/29/2019   Prediabetes 11/02/2019   Hypertension 06/14/2019   Vitamin D deficiency 08/28/2018   Class 1 obesity with serious comorbidity and body mass index (BMI) of 34.0 to 34.9 in adult 08/27/2018   Attention deficit hyperactivity disorder (ADHD), combined type 08/27/2018   Diverticulitis of sigmoid colon 07/22/2016   Lyndee Hensen, PT, DPT 11:23 AM  07/11/20    [facility] [place], [place], [address] Phone: [phone]   Fax:  [phone]  Name: Crystal Madden MRN: [id number] Date of Birth: 1975/10/20

## 2020-07-11 NOTE — Therapy (Signed)
[facility] [address] [place], [place], [address] Phone: [phone]   Fax:  [phone]  Physical Therapy Treatment  Patient Details  Name: Crystal Madden MRN: [id number] Date of Birth: [birth year] Referring Provider (PT): Inda Coke   Encounter Date: 07/11/2020   PT End of Session - 07/11/20 1213    Visit Number 4    Number of Visits 12    Date for PT Re-Evaluation 07/25/20    Authorization Type [facility]    PT Start Time 0804    PT Stop Time 0844    PT Time Calculation (min) 40 min    Activity Tolerance Patient tolerated treatment well    Behavior During Therapy [facility] for tasks assessed/performed           Past Medical History:  Diagnosis Date  . Anxiety   . Depression   . Diabetes mellitus without complication ([place])   . High blood pressure   . Hyperlipidemia   . Lower back pain   . Obesity (BMI 30-39.9)     Past Surgical History:  Procedure Laterality Date  . BLADDER SUSPENSION    . CESAREAN SECTION    . LUMBAR MICRODISCECTOMY  2018  . TONSILLECTOMY  2000    There were no vitals filed for this visit.   Subjective Assessment - 07/11/20 1212    Subjective Pt states very minimal pain in arm, feels slight weakness, but feels strength is improving.    Currently in Pain? No/denies                             [facility] Adult PT Treatment/Exercise - 07/11/20 1217      Exercises   Exercises Neck      Neck Exercises: Theraband   Other Theraband Exercises Rows GTB x 20; Low Row GTB x 20;     Other Theraband Exercises BIl ER RTB x 15;  Horiz Abd RTB x 20;       Neck Exercises: Standing   Other Standing Exercises UE nerve glides ulnar and med x 10 ea;  Wall push ups x 15;  UE flexion x 10;     Other Standing Exercises Bicep Curls 5 lb hammer curl and reg curl x 10 ea bil;       Neck Exercises: Seated   Other Seated Exercise --      Manual Therapy   Joint Mobilization PA mobs gr 3,  side glides     Soft tissue mobilization  STM/DTM for bil UT, cervical paraspinals;     Passive ROM Manual stretching for bil UTs     Manual Traction 10 sec x 10;       Neck Exercises: Stretches   Upper Trapezius Stretch 2 reps;30 seconds;Right;Left    Levator Stretch 2 reps;30 seconds;Right;Left    Other Neck Stretches Pec stretch in doorway 30 sec x 3;                   PT Education - 07/11/20 1213    Education Details Reviewed HEP for best strenghtening for UEs, scapular region, and stretches for neck/ shoulder.    Person(s) Educated Patient    Methods Explanation;Demonstration;Tactile cues;Verbal cues    Comprehension Verbalized understanding;Returned demonstration;Verbal cues required;Tactile cues required            PT Short Term Goals - 07/11/20 1214      PT SHORT TERM GOAL #1  Title Pt to be independent with initial HEP    Time 2    Period Weeks    Status Achieved    Target Date 06/27/20             PT Long Term Goals - 07/11/20 1214      PT LONG TERM GOAL #1   Title Pt to be independent with final HEP    Time 6    Period Weeks    Status Achieved      PT LONG TERM GOAL #2   Title Pt to report decreased pain in neck and R UE to 0-1/10 with activity    Time 6    Period Weeks    Status Achieved      PT LONG TERM GOAL #3   Title Pt to demo soft tissue restrictions in UT region to be WNL for decreased pain .    Time 6    Period Weeks    Status Achieved      PT LONG TERM GOAL #4   Title Pt to report full use of R UE without deficit    Time 6    Period Weeks    Status Achieved                 Plan - 07/11/20 1215    Clinical Impression Statement Pt with improved muscle tension in cervical region. Doing well with ther ex, reviewed HEP today for continued UE strength and neck mobility. Pt doing very well at this time, no significant deficits with ROM or pain, improving strength, slight strength/endurance deficit with hand, will address  with HEP. Pt will be put on hold at this time, will onlyreturn if she has increased pain, difficulty in next 2 weeks, otherwise will d/c. Goals met, pt in agreement with plan.    Examination-Activity Limitations Lift;Carry;Caring for Others    Examination-Participation Restrictions Meal Prep;Cleaning;Community Activity;Driving;Yard Work;Occupation    Stability/Clinical Decision Making Stable/Uncomplicated    Rehab Potential Good    PT Frequency 2x / week    PT Duration 6 weeks    PT Treatment/Interventions ADLs/Self Care Home Management;Cryotherapy;Electrical Stimulation;DME Instruction;Ultrasound;Traction;Moist Heat;Iontophoresis 38m/ml Dexamethasone;Functional mobility training;Therapeutic activities;Therapeutic exercise;Neuromuscular re-education;Manual techniques;Patient/family education;Passive range of motion;Dry needling;Joint Manipulations;Spinal Manipulations;Taping    Consulted and Agree with Plan of Care Patient           Patient will benefit from skilled therapeutic intervention in order to improve the following deficits and impairments:  Decreased range of motion, Increased muscle spasms, Decreased activity tolerance, Pain, Decreased mobility, Decreased strength, Impaired UE functional use  Visit Diagnosis: Radiculopathy, cervical region     Problem List Patient Active Problem List   Diagnosis Date Noted  . Cervical disc disorder with radiculopathy 03/09/2020  . Insulin resistance 12/29/2019  . Prediabetes 11/02/2019  . Hypertension 06/14/2019  . Vitamin D deficiency 08/28/2018  . Class 1 obesity with serious comorbidity and body mass index (BMI) of 34.0 to 34.9 in adult 08/27/2018  . Attention deficit hyperactivity disorder (ADHD), combined type 08/27/2018  . Diverticulitis of sigmoid colon 07/22/2016    LLyndee Hensen PT, DPT 12:19 PM  07/11/20    [place] [address]Phone:  [phone]  Fax:  [phone] Name: Crystal RASMUSSONMRN: [id number] of Birth: 6January 01, 1978

## 2020-07-11 NOTE — Therapy (Signed)
[facility] [address] [place], [place], [address] Phone: [phone]   Fax:  [phone]  Physical Therapy Treatment  Patient Details  Name: Crystal Madden MRN: [id number] Date of Birth: [birth year] Referring Provider (PT): Inda Coke   Encounter Date: 07/04/2020   PT End of Session - 07/11/20 1125    Visit Number 3    Number of Visits 12    Date for PT Re-Evaluation 07/25/20    Authorization Type [facility]    PT Start Time 0805    PT Stop Time 0845    PT Time Calculation (min) 40 min    Activity Tolerance Patient tolerated treatment well    Behavior During Therapy [facility] for tasks assessed/performed           Past Medical History:  Diagnosis Date  . Anxiety   . Depression   . Diabetes mellitus without complication ([place])   . High blood pressure   . Hyperlipidemia   . Lower back pain   . Obesity (BMI 30-39.9)     Past Surgical History:  Procedure Laterality Date  . BLADDER SUSPENSION    . CESAREAN SECTION    . LUMBAR MICRODISCECTOMY  2018  . TONSILLECTOMY  2000    There were no vitals filed for this visit.   Subjective Assessment - 07/11/20 1123    Subjective Pt states soreness in neck/headache after last visit with traction. Feels neck pain is improved, and UE weakness, numbness slowly improving. Still feels like she has weakness in hand, forearm.    Currently in Pain? Yes    Pain Score 0-No pain    Pain Location Neck    Pain Score 2    Pain Location Arm    Pain Orientation Right    Pain Descriptors / Indicators Numbness                             [facility] Adult PT Treatment/Exercise - 07/11/20 1127      Exercises   Exercises Neck      Neck Exercises: Theraband   Other Theraband Exercises Rows GTB x 20; Low Row GTB x 20;     Other Theraband Exercises BIl ER RTB x 15;  Horiz Abd RTB x 20;       Neck Exercises: Standing   Other Standing Exercises UE nerve glides ulnar and  med x 10 ea;     Other Standing Exercises Bicep Curls 5 lb hammer curl and reg curl x 10 ea bil;       Neck Exercises: Seated   Other Seated Exercise Wrist Ext 2 lb x 20 on R;   Rad dev 2 lb x15;  Ball squeeze/grip x 20 R;       Neck Exercises: Supine   Other Supine Exercise --      Modalities   Modalities --      Traction   Type of Traction --    Min (lbs) --    Max (lbs) --    Time --      Manual Therapy   Joint Mobilization PA mobs gr 3, side glides     Soft tissue mobilization  STM/DTM for bil UT, cervical paraspinals; SO;     Passive ROM Manual stretching for bil UTs     Manual Traction 10 sec x 10;       Neck Exercises: Stretches   Upper Trapezius Stretch 2 reps;30 seconds;Right;Left  Levator Stretch 2 reps;30 seconds;Right;Left    Other Neck Stretches Pec stretch in doorway 30 sec x 3;                   PT Education - 07/11/20 1124    Education Details Reviewed HEP, updated to include UE, wrist, hand strengthening    Person(s) Educated Patient    Methods Explanation;Demonstration;Tactile cues;Verbal cues;Handout    Comprehension Verbalized understanding;Returned demonstration;Verbal cues required            PT Short Term Goals - 07/11/20 1058      PT SHORT TERM GOAL #1   Title Pt to be independent with initial HEP    Time 2    Period Weeks    Status New    Target Date 06/27/20             PT Long Term Goals - 07/11/20 1058      PT LONG TERM GOAL #1   Title Pt to be independent with final HEP    Time 6    Period Weeks    Status New    Target Date 07/25/20      PT LONG TERM GOAL #2   Title Pt to report decreased pain in neck and R UE to 0-1/10 with activity    Time 6    Period Weeks    Status New    Target Date 07/25/20      PT LONG TERM GOAL #3   Title Pt to demo soft tissue restrictions in UT region to be WNL for decreased pain .    Time 6    Period Weeks    Status New    Target Date 07/25/20      PT LONG TERM GOAL #4    Title Pt to report full use of R UE without deficit    Time 6    Period Weeks    Status New    Target Date 07/25/20                 Plan - 07/11/20 1126    Clinical Impression Statement Pt with improving muscle tension in cervial region, and improving pain. Little pain noted, just mild weakness in hand/forearm. Focus on UE  strengthening and education for HEP today. Pt requested not to do traction due to soreness last visit. Manual traction done today .Pt progressing well.    Examination-Activity Limitations Lift;Carry;Caring for Others    Examination-Participation Restrictions Meal Prep;Cleaning;Community Activity;Driving;Yard Work;Occupation    Stability/Clinical Decision Making Stable/Uncomplicated    Rehab Potential Good    PT Frequency 2x / week    PT Duration 6 weeks    PT Treatment/Interventions ADLs/Self Care Home Management;Cryotherapy;Electrical Stimulation;DME Instruction;Ultrasound;Traction;Moist Heat;Iontophoresis 4mg /ml Dexamethasone;Functional mobility training;Therapeutic activities;Therapeutic exercise;Neuromuscular re-education;Manual techniques;Patient/family education;Passive range of motion;Dry needling;Joint Manipulations;Spinal Manipulations;Taping    Consulted and Agree with Plan of Care Patient           Patient will benefit from skilled therapeutic intervention in order to improve the following deficits and impairments:  Decreased range of motion, Increased muscle spasms, Decreased activity tolerance, Pain, Decreased mobility, Decreased strength, Impaired UE functional use  Visit Diagnosis: Radiculopathy, cervical region     Problem List Patient Active Problem List   Diagnosis Date Noted  . Cervical disc disorder with radiculopathy 03/09/2020  . Insulin resistance 12/29/2019  . Prediabetes 11/02/2019  . Hypertension 06/14/2019  . Vitamin D deficiency 08/28/2018  . Class 1 obesity with serious comorbidity and body  mass index (BMI) of 34.0 to 34.9  in adult 08/27/2018  . Attention deficit hyperactivity disorder (ADHD), combined type 08/27/2018  . Diverticulitis of sigmoid colon 07/22/2016    Lyndee Hensen, PT, DPT 11:31 AM  07/11/20    [facility] [place], [place], [address] Phone: [phone]   Fax:  [phone]  Name: Crystal Madden MRN: [id number] Date of Birth: [birth year]

## 2020-07-16 ENCOUNTER — Other Ambulatory Visit: Payer: Self-pay | Admitting: Physician Assistant

## 2020-07-16 MED ORDER — VALSARTAN-HYDROCHLOROTHIAZIDE 80-12.5 MG PO TABS: 1.0000 | ORAL_TABLET | Freq: Every day | ORAL | 0 refills | Status: DC

## 2020-07-16 MED FILL — VALSARTAN-HCTZ 80-12.5 MG T: 80-12.5 | 90 days supply | Qty: 90 | Fill #0

## 2020-07-16 MED FILL — VYVANSE 50 MG CHEWABLE TAB: 50 | 30 days supply | Qty: 30 | Fill #0

## 2020-07-18 ENCOUNTER — Encounter: Payer: No Typology Code available for payment source | Admitting: Physical Therapy

## 2020-07-20 ENCOUNTER — Other Ambulatory Visit (HOSPITAL_COMMUNITY): Payer: Self-pay | Admitting: Dermatology

## 2020-07-20 MED FILL — metroNIDAZOLE 0.75 % CREA: 0.75 | 30 days supply | Qty: 45 | Fill #0

## 2020-07-25 ENCOUNTER — Encounter: Payer: No Typology Code available for payment source | Admitting: Physical Therapy

## 2020-07-30 MED FILL — NORLYDA 0.35 MG TABS: 0.35 | 84 days supply | Qty: 84 | Fill #0

## 2020-07-31 ENCOUNTER — Other Ambulatory Visit: Payer: Self-pay

## 2020-07-31 ENCOUNTER — Ambulatory Visit: Payer: No Typology Code available for payment source | Admitting: Physical Therapy

## 2020-07-31 DIAGNOSIS — M5412 Radiculopathy, cervical region: Secondary | ICD-10-CM | POA: Diagnosis not present

## 2020-08-03 ENCOUNTER — Encounter: Payer: Self-pay | Admitting: Physical Therapy

## 2020-08-03 NOTE — Therapy (Addendum)
[facility] [address] [place], [place], [address] Phone: [phone]   Fax:  [phone]  Physical Therapy Treatment/Re-Cert   Patient Details  Name: Crystal Madden MRN: [id number] Date of Birth: 12-03-1975 Referring Provider (PT): Inda Coke   Encounter Date: 07/31/2020   PT End of Session - 08/03/20 1253     Visit Number 5    Number of Visits 12    Date for PT Re-Evaluation 09/11/20    Authorization Type [facility]    PT Start Time 1100    PT Stop Time 1148    PT Time Calculation (min) 48 min    Activity Tolerance Patient tolerated treatment well    Behavior During Therapy [facility] for tasks assessed/performed             Past Medical History:  Diagnosis Date   Anxiety    Depression    Diabetes mellitus without complication ([place])    High blood pressure    Hyperlipidemia    Lower back pain    Obesity (BMI 30-39.9)     Past Surgical History:  Procedure Laterality Date   BLADDER SUSPENSION     CESAREAN SECTION     LUMBAR MICRODISCECTOMY  2018   TONSILLECTOMY  2000    There were no vitals filed for this visit.   Subjective Assessment - 08/03/20 0828     Subjective Pt last seen 3 weeks ago, was going well at that time. Has had increase in pain and tightness in R cervical region in last few days.    Currently in Pain? Yes    Pain Score 3     Pain Location Neck    Pain Orientation Right    Pain Descriptors / Indicators Aching;Spasm;Tightness    Pain Type Acute pain    Pain Onset More than a month ago    Pain Frequency Intermittent    Aggravating Factors  rotation, work duties                [facility] PT Assessment - 08/03/20 0001       AROM   Overall AROM Comments cervical : WNL, soreness with R SB and rot      Palpation   Palpation comment hypertonicity and soreness in R UT, levator                           [facility] Adult PT Treatment/Exercise - 08/03/20 0001        Neck Exercises: Theraband   Other Theraband Exercises Rows GTB x 20; Low Row GTB x 20;     Other Theraband Exercises Bil ER GTB x 15;      Neck Exercises: Seated   Shoulder Rolls Backwards;15 reps      Manual Therapy   Joint Mobilization PA mobs gr 3, side glides     Soft tissue mobilization  STM/DTM for bil UT, cervical paraspinals;     Passive ROM Manual stretching for R UT and levator    Manual Traction 10 sec x 10;       Neck Exercises: Stretches   Upper Trapezius Stretch 2 reps;30 seconds;Right;Left    Levator Stretch 2 reps;30 seconds;Right;Left              Trigger Point Dry Needling - 08/03/20 0001     Consent Given? Yes    Education Handout Provided Previously provided    Muscles Treated Head and Neck Upper trapezius;Levator scapulae  Upper Trapezius Response Twitch reponse elicited;Palpable increased muscle length   R   Levator Scapulae Response Twitch response elicited;Palpable increased muscle length   R                 PT Education - 08/03/20 1252     Education Details Reviewed HEP    Person(s) Educated Patient    Methods Explanation;Demonstration;Tactile cues;Verbal cues    Comprehension Verbalized understanding;Returned demonstration;Verbal cues required              PT Short Term Goals - 07/11/20 1214       PT SHORT TERM GOAL #1   Title Pt to be independent with initial HEP    Time 2    Period Weeks    Status Achieved    Target Date 06/27/20               PT Long Term Goals - 08/03/20 1254       PT LONG TERM GOAL #1   Title Pt to be independent with final HEP    Time 6    Period Weeks    Status Achieved      PT LONG TERM GOAL #2   Title Pt to report decreased pain in neck and R UE to 0-1/10 with activity    Time 6    Period Weeks    Status Partially Met    Target Date 09/11/20      PT LONG TERM GOAL #3   Title Pt to demo soft tissue restrictions in UT region to be WNL for decreased pain .    Time 6    Period  Weeks    Status Partially Met    Target Date 09/11/20      PT LONG TERM GOAL #4   Title Pt to report full use of R UE without deficit    Time 6    Period Weeks    Status Achieved                   Plan - 08/03/20 1255     Clinical Impression Statement Pt treated today for increased pain in R cervical region, symptoms same as previously, but have returned. She was pain free for a few weeks, but has had increased pain and tightness this week.  Dry needling done with good twitch response and good tolerance. Reviewed best ther ex for HEP and tightness. Pt will benefit from continued  care to improve pain and deficits.    Examination-Activity Limitations Lift;Carry;Caring for Others    Examination-Participation Restrictions Meal Prep;Cleaning;Community Activity;Driving;Yard Work;Occupation    Stability/Clinical Decision Making Stable/Uncomplicated    Rehab Potential Good    PT Frequency 2x / week    PT Duration 6 weeks    PT Treatment/Interventions ADLs/Self Care Home Management;Cryotherapy;Electrical Stimulation;DME Instruction;Ultrasound;Traction;Moist Heat;Iontophoresis 43m/ml Dexamethasone;Functional mobility training;Therapeutic activities;Therapeutic exercise;Neuromuscular re-education;Manual techniques;Patient/family education;Passive range of motion;Dry needling;Joint Manipulations;Spinal Manipulations;Taping    Consulted and Agree with Plan of Care Patient             Patient will benefit from skilled therapeutic intervention in order to improve the following deficits and impairments:  Decreased range of motion,Increased muscle spasms,Decreased activity tolerance,Pain,Decreased mobility,Decreased strength,Impaired UE functional use  Visit Diagnosis: Radiculopathy, cervical region     Problem List Patient Active Problem List   Diagnosis Date Noted   Cervical disc disorder with radiculopathy 03/09/2020   Insulin resistance 12/29/2019   Prediabetes 11/02/2019    Hypertension 06/14/2019   Vitamin  D deficiency 08/28/2018   Class 1 obesity with serious comorbidity and body mass index (BMI) of 34.0 to 34.9 in adult 08/27/2018   Attention deficit hyperactivity disorder (ADHD), combined type 08/27/2018   Diverticulitis of sigmoid colon 07/22/2016    Lyndee Hensen, PT, DPT 1:02 PM  08/03/20    [facility] [place], [place], [address] Phone: [phone]   Fax:  [phone]  Name: Crystal Madden MRN: [id number] Date of Birth: 05-Mar-1976  PHYSICAL THERAPY DISCHARGE SUMMARY  Visits from Start of Care: 5  Plan: Patient agrees to discharge.  Patient goals were partially met. Patient is being discharged due to- not returning since last visit.    Lyndee Hensen, PT, DPT 10:33 AM  10/14/21

## 2020-08-17 ENCOUNTER — Other Ambulatory Visit (HOSPITAL_COMMUNITY): Payer: Self-pay | Admitting: Physician Assistant

## 2020-08-17 DIAGNOSIS — Z79899 Other long term (current) drug therapy: Secondary | ICD-10-CM | POA: Diagnosis not present

## 2020-08-17 DIAGNOSIS — F902 Attention-deficit hyperactivity disorder, combined type: Secondary | ICD-10-CM | POA: Diagnosis not present

## 2020-08-17 MED FILL — VYVANSE 50 MG CHEWABLE TAB: 50 | 30 days supply | Qty: 30 | Fill #0

## 2020-09-06 ENCOUNTER — Other Ambulatory Visit: Payer: Self-pay | Admitting: Physician Assistant

## 2020-09-06 DIAGNOSIS — Z1231 Encounter for screening mammogram for malignant neoplasm of breast: Secondary | ICD-10-CM

## 2020-09-17 ENCOUNTER — Encounter: Payer: Self-pay | Admitting: Physician Assistant

## 2020-09-17 MED FILL — VYVANSE 50 MG CHEWABLE TAB: 50 | 30 days supply | Qty: 30 | Fill #0

## 2020-09-19 ENCOUNTER — Telehealth (INDEPENDENT_AMBULATORY_CARE_PROVIDER_SITE_OTHER): Payer: 59 | Admitting: Physician Assistant

## 2020-09-19 ENCOUNTER — Encounter: Payer: Self-pay | Admitting: Physician Assistant

## 2020-09-19 ENCOUNTER — Other Ambulatory Visit: Payer: Self-pay

## 2020-09-19 ENCOUNTER — Other Ambulatory Visit: Payer: Self-pay | Admitting: Physician Assistant

## 2020-09-19 VITALS — Ht 66.0 in | Wt 218.7 lb

## 2020-09-19 DIAGNOSIS — U071 COVID-19: Secondary | ICD-10-CM

## 2020-09-19 MED ORDER — ALBUTEROL SULFATE HFA 108 (90 BASE) MCG/ACT IN AERS: 2.0000 | INHALATION_SPRAY | Freq: Four times a day (QID) | RESPIRATORY_TRACT | 1 refills | Status: DC | PRN

## 2020-09-19 MED ORDER — BENZONATATE 100 MG PO CAPS: 100.0000 mg | ORAL_CAPSULE | Freq: Three times a day (TID) | ORAL | 1 refills | Status: DC | PRN

## 2020-09-19 MED ORDER — HYDROCOD POLST-CPM POLST ER 10-8 MG/5ML PO SUER: 5.0000 mL | Freq: Every evening | ORAL | 0 refills | Status: DC | PRN

## 2020-09-19 MED FILL — HYDROCODONE-CHLORPHEN ER SU: 10-8 | 23 days supply | Qty: 115 | Fill #0

## 2020-09-19 MED FILL — ALBUTEROL SULFATE HFA 108 (: 108 (90 BAS | 25 days supply | Qty: 18 | Fill #0

## 2020-09-19 MED FILL — BENZONATATE 100 MG CAPS: 100 | 10 days supply | Qty: 30 | Fill #0

## 2020-09-19 NOTE — Progress Notes (Signed)
Patient: Crystal Madden MRN: [id number] DOB: Sep 21, 1975 PCP: Inda Coke, PA     I connected with Osborn Coho on 09/19/20 at 800AM by a video enabled telemedicine application and verified that I am speaking with the correct person using two identifiers.  Location patient: Home Location provider: [facility], Office Persons participating in this virtual visit: [place], [place]  I discussed the limitations of evaluation and management by telemedicine and the availability of in person appointments. The patient expressed understanding and agreed to proceed.   Subjective:  Chief Complaint  Patient presents with  . Cough  . Nasal Congestion  . Follow-up    Covid; Pt says that she was positive 2 weeks ago, but still has a lingering cough and nasal congestion.     HPI: The patient is a 45 y.o. female who presents today for lingering cough s/p COVID-19. She was COVID-19 positive in the middle of January. She is fully vaccinated. Had overall mild symptoms during her illness. Her entire household got [place]. She was able to work from home the entire time. Had two days of achy flu-like symptoms but overall symptoms gradually have improved except for her cough.  Cough is throughout the day but is worse at night when she is laying down and things are draining/settling. Denies fever, chills, SOB, n/v/d. No prior hx of asthma but did have some possible reactive airway disease.  Review of Systems  Allergies Patient is allergic to amlodipine.  Past Medical History Patient  has a past medical history of Anxiety, Depression, Diabetes mellitus without complication ([place]), High blood pressure, Hyperlipidemia, Lower back pain, and Obesity (BMI 30-39.9).  Surgical History Patient  has a past surgical history that includes Cesarean section; Bladder suspension; Lumbar microdiscectomy (2018); and Tonsillectomy (2000).  Family History Pateint's family history includes  COPD in her father; Cancer in her father, maternal grandfather, and paternal grandmother; Heart disease in her father; Hyperlipidemia in her mother; Hypertension in her father and mother; Obesity in her mother; Stroke in her father.  Social History Patient  reports that she quit smoking about 16 years ago. She has never used smokeless tobacco. She reports current alcohol use. She reports that she does not use drugs.    Objective: Vitals:   09/19/20 0735  Weight: 218 lb 11.1 oz (99.2 kg)  Height: 5\' 6"  (1.676 m)    Body mass index is 35.3 kg/m.  Physical Exam Constitutional:      Appearance: She is well-developed and well-nourished.  HENT:     Head: Normocephalic and atraumatic.  Eyes:     Extraocular Movements: EOM normal.     Conjunctiva/sclera: Conjunctivae normal.  Pulmonary:     Effort: Pulmonary effort is normal.  Musculoskeletal:        General: Normal range of motion.     Cervical back: Normal range of motion and neck supple.  Neurological:     Mental Status: She is alert and oriented to person, place, and time.  Psychiatric:        Mood and Affect: Mood and affect normal.        Behavior: Behavior normal.        Thought Content: Thought content normal.        Judgment: Judgment normal.        Assessment/plan:  COVID-19  Patient has a respiratory illness without signs of acute distress or respiratory compromise at this time.  Suspect post-viral cough. Will trial albuterol inhaler prn, tessalon perles prn, and  tussionex cough syrup at night prn. Advised if they experience a "second sickening" or worsening symptoms as the illness progresses, they are to call the office for further instructions or seek emergent evaluation for any severe symptoms.    No follow-ups on file.  Records requested if needed. Time spent with patient: 15 minutes, of which >50% was spent in obtaining information about her symptoms, reviweing her previous labs, evaluations, and treatments,  counseling her about her conditions (please see discussed topics above), and developing a plan to further investigate it; she had a number of questions which I addressed.    Inda Coke, PA-C [place]  09/19/2020

## 2020-09-20 ENCOUNTER — Inpatient Hospital Stay: Admission: RE | Admit: 2020-09-20 | Payer: 59 | Source: Ambulatory Visit

## 2020-09-20 DIAGNOSIS — Z1231 Encounter for screening mammogram for malignant neoplasm of breast: Secondary | ICD-10-CM

## 2020-09-24 ENCOUNTER — Other Ambulatory Visit (HOSPITAL_COMMUNITY): Payer: Self-pay | Admitting: Nurse Practitioner

## 2020-09-24 DIAGNOSIS — Z3041 Encounter for surveillance of contraceptive pills: Secondary | ICD-10-CM | POA: Diagnosis not present

## 2020-09-24 DIAGNOSIS — Z01419 Encounter for gynecological examination (general) (routine) without abnormal findings: Secondary | ICD-10-CM | POA: Diagnosis not present

## 2020-09-24 MED FILL — SLYND 4 MG TABS: 4 | 84 days supply | Qty: 84 | Fill #0

## 2020-10-15 ENCOUNTER — Other Ambulatory Visit: Payer: Self-pay | Admitting: Physician Assistant

## 2020-10-15 MED FILL — VALSARTAN-HCTZ 80-12.5 MG T: 80-12.5 | 90 days supply | Qty: 90 | Fill #0

## 2020-10-16 MED FILL — VYVANSE 50 MG CHEWABLE TAB: 50 | 30 days supply | Qty: 30 | Fill #0

## 2020-11-08 ENCOUNTER — Other Ambulatory Visit: Payer: Self-pay

## 2020-11-08 ENCOUNTER — Ambulatory Visit
Admission: RE | Admit: 2020-11-08 | Discharge: 2020-11-08 | Disposition: A | Discharge status: Home / Self Care | Payer: 59 | Source: Ambulatory Visit | Attending: Physician Assistant | Admitting: Physician Assistant

## 2020-11-08 DIAGNOSIS — Z1231 Encounter for screening mammogram for malignant neoplasm of breast: Secondary | ICD-10-CM | POA: Diagnosis not present

## 2020-11-08 IMAGING — MG MM DIGITAL SCREENING BILAT W/ TOMO AND CAD
8 series · 8 of 24 positions shown · non-contrast
Comparison: Previous exam(s).

CLINICAL DATA: Screening.

EXAM:
DIGITAL SCREENING BILATERAL MAMMOGRAM WITH TOMOSYNTHESIS AND CAD
TECHNIQUE: Bilateral screening digital craniocaudal and mediolateral oblique
mammograms were obtained. Bilateral screening digital breast
tomosynthesis was performed. The images were evaluated with
computer-aided detection.

[R CC synth-2D]
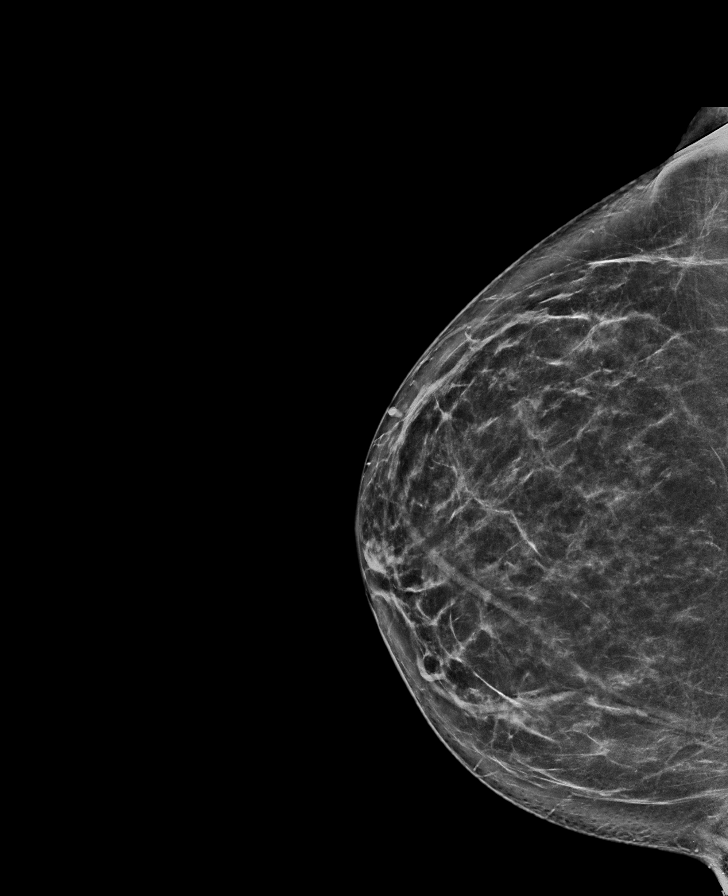

[R MLO synth-2D]
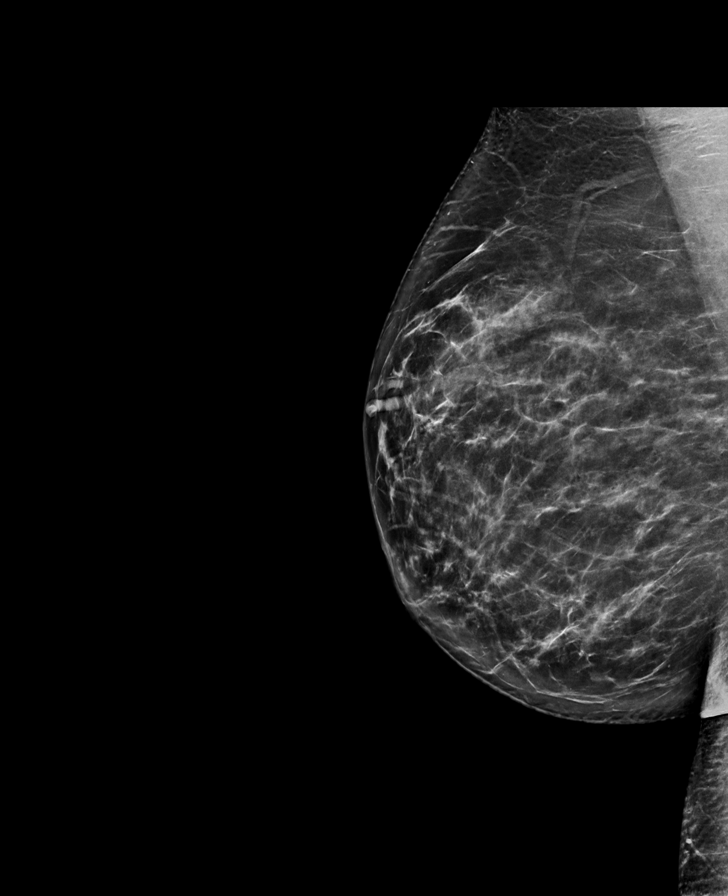

[L MLO synth-2D]
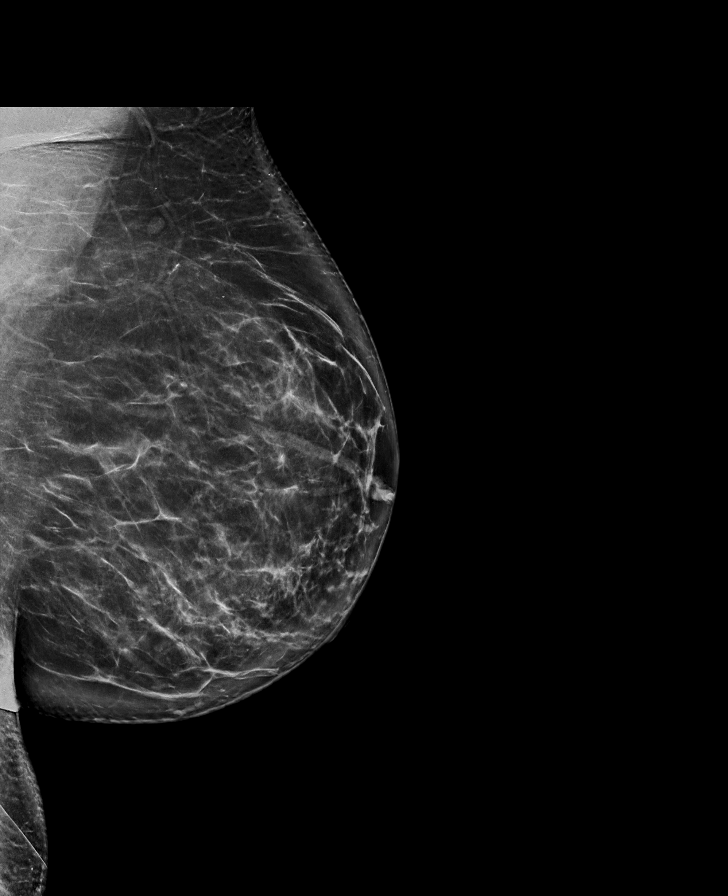

[L CC synth-2D]
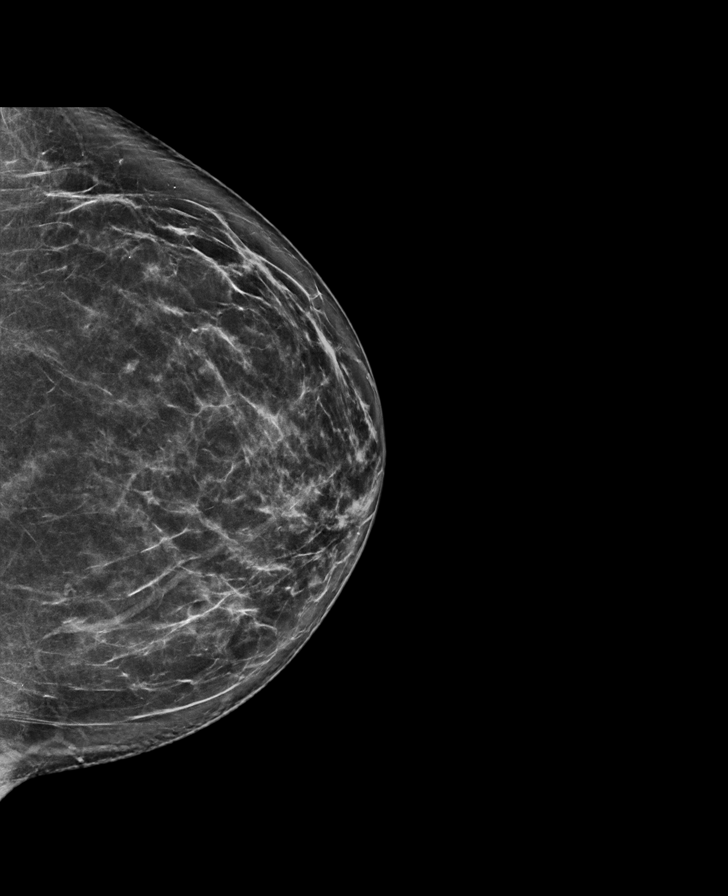

[R MLO tomo · tomo slice 44/87.0]
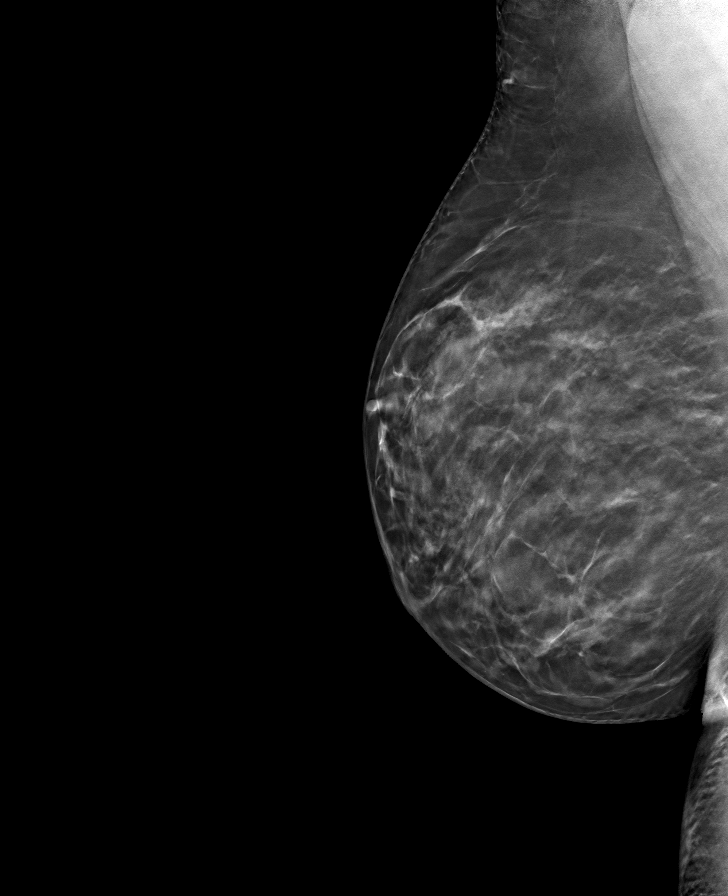

[R CC tomo · tomo slice 43/85.0]
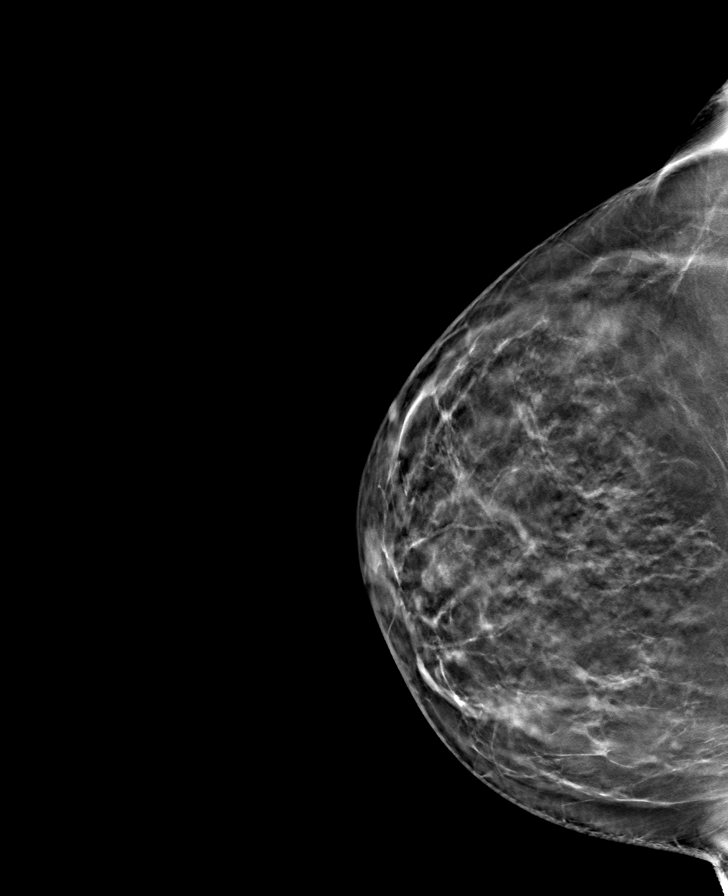

[L CC tomo · tomo slice 43/85.0]
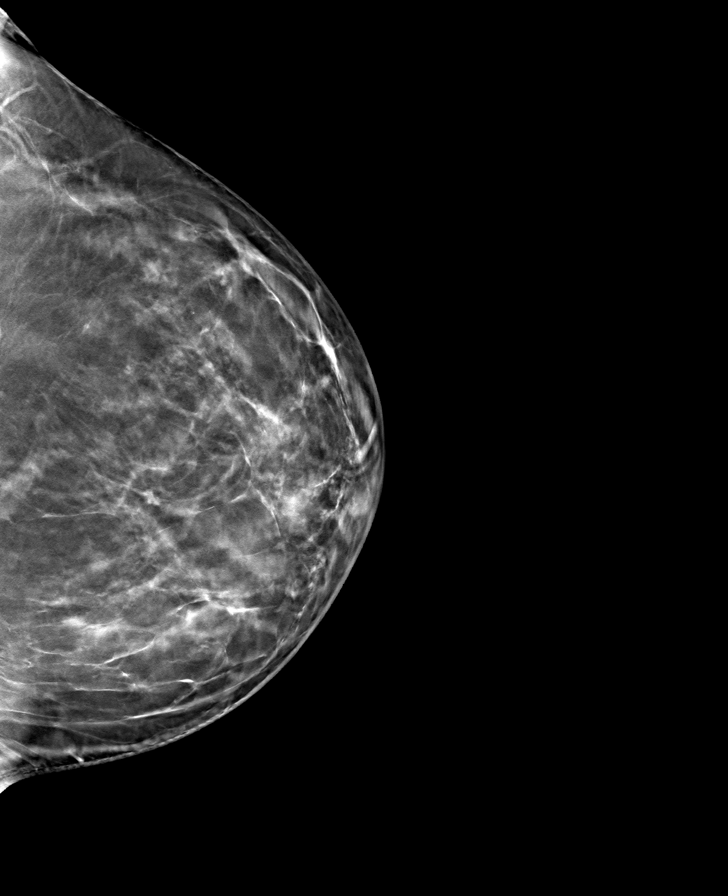

[L MLO tomo · tomo slice 47/94.0]
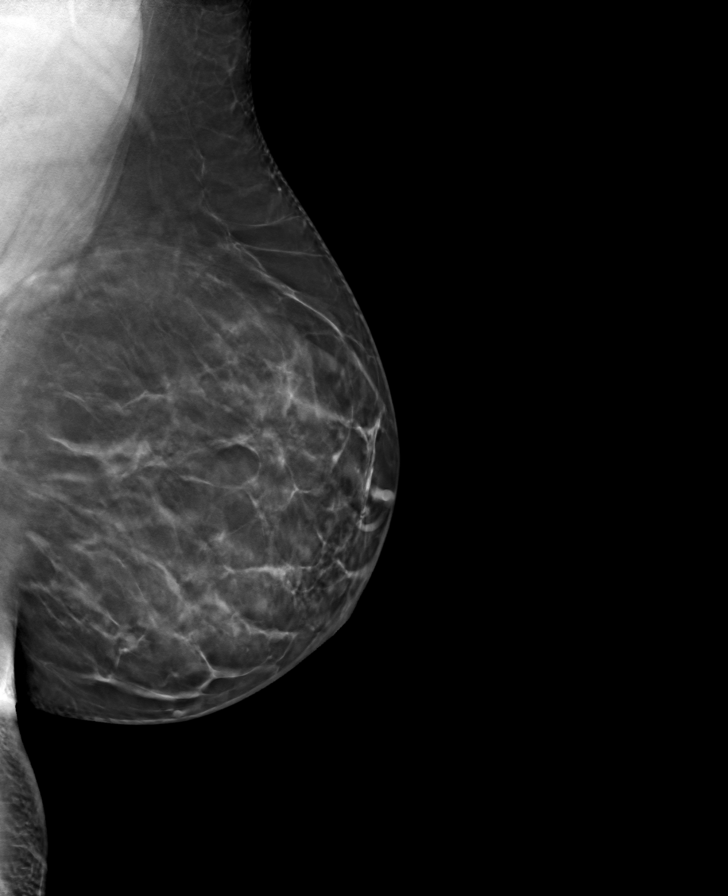

[8 of 24 positions shown; findings below may reference images not displayed]

ACR Breast Density Category c: The breast tissue is heterogeneously
dense, which may obscure small masses.
FINDINGS: There are no findings suspicious for malignancy. The images were
evaluated with computer-aided detection.
IMPRESSION: No mammographic evidence of malignancy. A result letter of this
screening mammogram will be mailed directly to the patient.

RECOMMENDATION:
Screening mammogram in one year. (Code:[6J])

BI-RADS CATEGORY  1: Negative.

## 2020-11-14 ENCOUNTER — Other Ambulatory Visit (HOSPITAL_COMMUNITY): Payer: Self-pay

## 2020-11-14 ENCOUNTER — Other Ambulatory Visit: Payer: Self-pay

## 2020-11-14 DIAGNOSIS — F902 Attention-deficit hyperactivity disorder, combined type: Secondary | ICD-10-CM | POA: Diagnosis not present

## 2020-11-14 DIAGNOSIS — Z79899 Other long term (current) drug therapy: Secondary | ICD-10-CM | POA: Diagnosis not present

## 2020-11-14 MED ORDER — VYVANSE 50 MG PO CHEW
1.0000 | CHEWABLE_TABLET | Freq: Every day | ORAL | 0 refills | Status: DC
  Filled 2020-11-14: qty 30, 30d supply, fill #0

## 2020-11-14 MED ORDER — VYVANSE 50 MG PO CHEW
50.0000 mg | CHEWABLE_TABLET | Freq: Every day | ORAL | 0 refills | Status: DC
  Filled 2021-01-14: qty 30, 30d supply, fill #0

## 2020-11-14 MED ORDER — VYVANSE 50 MG PO CHEW
50.0000 mg | CHEWABLE_TABLET | Freq: Every day | ORAL | 0 refills | Status: DC
  Filled 2020-12-13 (×2): qty 30, 30d supply, fill #0

## 2020-11-15 ENCOUNTER — Other Ambulatory Visit (HOSPITAL_COMMUNITY): Payer: Self-pay

## 2020-11-15 MED FILL — Benzonatate Cap 100 MG: ORAL | 10 days supply | Qty: 30 | Fill #0 | Status: CN

## 2020-11-16 ENCOUNTER — Other Ambulatory Visit (HOSPITAL_COMMUNITY): Payer: Self-pay

## 2020-11-16 ENCOUNTER — Other Ambulatory Visit: Payer: Self-pay

## 2020-12-11 ENCOUNTER — Other Ambulatory Visit (HOSPITAL_COMMUNITY): Payer: Self-pay

## 2020-12-13 ENCOUNTER — Other Ambulatory Visit (HOSPITAL_COMMUNITY): Payer: Self-pay

## 2021-01-14 ENCOUNTER — Other Ambulatory Visit: Payer: Self-pay | Admitting: Physician Assistant

## 2021-01-14 ENCOUNTER — Other Ambulatory Visit (HOSPITAL_COMMUNITY): Payer: Self-pay

## 2021-01-14 MED ORDER — VALSARTAN-HYDROCHLOROTHIAZIDE 80-12.5 MG PO TABS
1.0000 | ORAL_TABLET | Freq: Every day | ORAL | 0 refills | Status: DC
  Filled 2021-01-14: qty 90, 90d supply, fill #0

## 2021-01-14 MED FILL — Drospirenone Tab 4 MG: ORAL | 84 days supply | Qty: 84 | Fill #0 | Status: AC

## 2021-01-15 ENCOUNTER — Other Ambulatory Visit (HOSPITAL_COMMUNITY): Payer: Self-pay

## 2021-01-15 MED ORDER — VYVANSE 50 MG PO CHEW
1.0000 | CHEWABLE_TABLET | Freq: Every day | ORAL | 0 refills | Status: DC
  Filled 2021-01-15: qty 30, 30d supply, fill #0

## 2021-02-08 ENCOUNTER — Other Ambulatory Visit (HOSPITAL_COMMUNITY): Payer: Self-pay

## 2021-02-08 DIAGNOSIS — Z79899 Other long term (current) drug therapy: Secondary | ICD-10-CM | POA: Diagnosis not present

## 2021-02-08 DIAGNOSIS — F902 Attention-deficit hyperactivity disorder, combined type: Secondary | ICD-10-CM | POA: Diagnosis not present

## 2021-02-08 MED ORDER — VYVANSE 50 MG PO CHEW
50.0000 mg | CHEWABLE_TABLET | Freq: Every day | ORAL | 0 refills | Status: DC
  Filled 2021-02-08 – 2021-02-12 (×3): qty 30, 30d supply, fill #0

## 2021-02-08 MED ORDER — VYVANSE 50 MG PO CHEW
50.0000 mg | CHEWABLE_TABLET | Freq: Every day | ORAL | 0 refills | Status: DC
  Filled 2021-04-18: qty 30, 30d supply, fill #0

## 2021-02-08 MED ORDER — VYVANSE 50 MG PO CHEW
50.0000 mg | CHEWABLE_TABLET | Freq: Every day | ORAL | 0 refills | Status: DC
  Filled 2021-03-15: qty 30, 30d supply, fill #0

## 2021-02-12 ENCOUNTER — Other Ambulatory Visit (HOSPITAL_COMMUNITY): Payer: Self-pay

## 2021-02-12 ENCOUNTER — Other Ambulatory Visit: Payer: Self-pay

## 2021-02-12 ENCOUNTER — Ambulatory Visit (INDEPENDENT_AMBULATORY_CARE_PROVIDER_SITE_OTHER): Payer: 59 | Admitting: Physician Assistant

## 2021-02-12 ENCOUNTER — Encounter: Payer: Self-pay | Admitting: Physician Assistant

## 2021-02-12 VITALS — BP 116/79 | HR 94 | Temp 97.1°F | Ht 66.0 in | Wt 227.2 lb

## 2021-02-12 DIAGNOSIS — Z131 Encounter for screening for diabetes mellitus: Secondary | ICD-10-CM | POA: Diagnosis not present

## 2021-02-12 DIAGNOSIS — L679 Hair color and hair shaft abnormality, unspecified: Secondary | ICD-10-CM | POA: Diagnosis not present

## 2021-02-12 DIAGNOSIS — Z13220 Encounter for screening for lipoid disorders: Secondary | ICD-10-CM

## 2021-02-12 DIAGNOSIS — R5383 Other fatigue: Secondary | ICD-10-CM

## 2021-02-12 DIAGNOSIS — Z6836 Body mass index (BMI) 36.0-36.9, adult: Secondary | ICD-10-CM | POA: Diagnosis not present

## 2021-02-12 DIAGNOSIS — L680 Hirsutism: Secondary | ICD-10-CM

## 2021-02-12 DIAGNOSIS — E8881 Metabolic syndrome: Secondary | ICD-10-CM | POA: Diagnosis not present

## 2021-02-12 NOTE — Progress Notes (Signed)
Acute Office Visit  Subjective:    Patient ID: Crystal Madden, female    DOB: [birth year], 45 y.o.   MRN: [id number]  Chief Complaint  Patient presents with   Alopecia    HPI Patient is in today for hair loss & thinning x worsening over the last year, diffusely around front of head.  [facility] last Summer - noted Insulin resistance. Metformin was given, which she stopped because her weight loss hit a plateau.  She notes that she did not have any trouble with this medication and did benefit from it for the time being.  She is still having trouble losing weight.  She is noticing more fatigue.  She has noticed facial hair on her chin.  She has 3 children and did not have any problems with fertility.  She is now worried about PCOS though and would like labs drawn. Takes birth control daily, but has fewer cycles.   Past Medical History:  Diagnosis Date   Anxiety    Depression    Diabetes mellitus without complication (HCC)    High blood pressure    Hyperlipidemia    Lower back pain    Obesity (BMI 30-39.9)     Past Surgical History:  Procedure Laterality Date   BLADDER SUSPENSION     CESAREAN SECTION     LUMBAR MICRODISCECTOMY  2018   TONSILLECTOMY  2000    Family History  Problem Relation Age of Onset   Hypertension Mother    Hyperlipidemia Mother    Obesity Mother    COPD Father    Cancer Father    Heart disease Father    Hypertension Father    Stroke Father    Cancer Maternal Grandfather    Cancer Paternal Grandmother    Colon cancer Neg Hx     Social History   Socioeconomic History   Marital status: Married    Spouse name: ryan   Number of children: Not on file   Years of education: Not on file   Highest education level: Not on file  Occupational History   Occupation: Quality Improvement    Employer: [employer]  Tobacco Use   Smoking status: Former    Pack years: 0.00    Types: Cigarettes    Quit date: 08/27/2004    Years since quitting: 16.4    Smokeless tobacco: Never  Vaping Use   Vaping Use: Never used  Substance and Sexual Activity   Alcohol use: Yes    Comment: Ocassional.   Drug use: No   Sexual activity: Not on file  Other Topics Concern   Not on file  Social History Narrative   right handed   Social Determinants of Health   Financial Resource Strain: Not on file  Food Insecurity: Not on file  Transportation Needs: Not on file  Physical Activity: Not on file  Stress: Not on file  Social Connections: Not on file  Intimate Partner Violence: Not on file    Outpatient Medications Prior to Visit  Medication Sig Dispense Refill   ALPRAZolam (XANAX) 0.5 MG tablet TAKE 1 TABLET BY MOUTH IN THE AM AND 1/2 TABLET IN THE AFTERNOON (Patient taking differently: Take 0.5 mg by mouth daily as needed for anxiety.) 30 tablet 2   AMBULATORY NON FORMULARY MEDICATION Single glucometer with lancets, test strips. Test daily R73.03. Prediabetes 1 each 0   bimatoprost (LATISSE) 0.03 % ophthalmic solution Place 1 drop into both eyes See admin instructions. PLACE 1 DROP ONTO  APPLICATOR & APPLY EVENLY ALONG SKIN OF UPPER LID AT BASE OF LASHES TO BOTH EYES AT BEDTIME 3 mL 12   blood glucose meter kit and supplies Used to check blood sugars daily 1 each 0   Drospirenone 4 MG TABS TAKE 1 TABLET BY MOUTH ONCE A DAY 84 tablet 3   glucose blood test strip Used to check blood sugars daily 100 each 0   Lancets MISC Used to check blood sugars daily 100 each 0   Lisdexamfetamine Dimesylate (VYVANSE) 50 MG CHEW Chew 1 tablet (50 mg) by mouth daily. May fill 60 days after date prescribed. Fill 01/14/21 30 tablet 0   valsartan-hydrochlorothiazide (DIOVAN-HCT) 80-12.5 MG tablet Take 1 tablet by mouth daily. 90 tablet 0   Lisdexamfetamine Dimesylate 40 MG CHEW CHEW 1 TABLET BY MOUTH DAILY 30 tablet 0   albuterol (VENTOLIN HFA) 108 (90 Base) MCG/ACT inhaler INHALE 2 PUFFS INTO THE LUNGS EVERY 6 (SIX) HOURS AS NEEDED FOR WHEEZING OR SHORTNESS OF BREATH.  18 g 1   benzonatate (TESSALON) 100 MG capsule TAKE 1 CAPSULE BY MOUTH THREE TIMES DAILY AS NEEDED FOR COUGH. 30 capsule 1   chlorpheniramine-HYDROcodone (TUSSIONEX) 10-8 MG/5ML SUER TAKE 5 MLS BY MOUTH AT BEDTIME AS NEEDED FOR COUGH. 115 mL 0   Lisdexamfetamine Dimesylate (VYVANSE) 50 MG CHEW Chew 1 tablet by mouth daily. 30 tablet 0   Lisdexamfetamine Dimesylate (VYVANSE) 50 MG CHEW Chew 1 tablet by mouth daily. 30 tablet 0   [START ON 04/09/2021] Lisdexamfetamine Dimesylate (VYVANSE) 50 MG CHEW Chew 1 tablet (50 mg) by mouth daily. 30 tablet 0   [START ON 03/10/2021] Lisdexamfetamine Dimesylate (VYVANSE) 50 MG CHEW Chew 1 tablet (50 mg) by mouth daily. 30 tablet 0   Lisdexamfetamine Dimesylate (VYVANSE) 50 MG CHEW Chew 1 tablet (50 mg) by mouth daily. 30 tablet 0   Lisdexamfetamine Dimesylate 40 MG CHEW CHEW 1 TABLET BY MOUTH DAILY 30 tablet 0   Lisdexamfetamine Dimesylate 50 MG CHEW CHEW 1 TABLET BY MOUTH DAILY 30 tablet 0   Lisdexamfetamine Dimesylate 50 MG CHEW TAKE 1 TABLET BY MOUTH ONCE A DAY. 30 tablet 0   Lisdexamfetamine Dimesylate 50 MG CHEW TAKE 1 TABLET BY MOUTH DAILY 30 tablet 0   metFORMIN (GLUCOPHAGE) 500 MG tablet Take 1 tablet (500 mg total) by mouth every morning. 30 tablet 0   metroNIDAZOLE (METROCREAM) 0.75 % cream APPLY DIME-SIZE AMOUNT TO ENTIRE FACE 2 TIMES A DAY 45 g 3   naproxen (NAPROSYN) 500 MG tablet Take 1 tablet (500 mg total) by mouth 2 (two) times daily with a meal. (Patient taking differently: Take 500 mg by mouth 2 (two) times daily as needed (for pain).) 30 tablet 0   norethindrone (MICRONOR) 0.35 MG tablet Take 1 tablet by mouth daily.     Vitamin D, Ergocalciferol, (DRISDOL) 1.25 MG (50000 UNIT) CAPS capsule Take 1 capsule (50,000 Units total) by mouth every 7 (seven) days. 4 capsule 0   VYVANSE 40 MG CHEW Chew 1 tablet by mouth daily.     No facility-administered medications prior to visit.    Allergies  Allergen Reactions   Amlodipine Swelling    Lower  leg swelling when increased to 10 mg    Review of Systems REFER TO HPI FOR PERTINENT POSITIVES AND NEGATIVES     Objective:    Physical Exam Vitals and nursing note reviewed.  Constitutional:      Appearance: Normal appearance. She is obese. She is not toxic-appearing.  HENT:  Head: Normocephalic and atraumatic.     Right Ear: External ear normal.     Left Ear: External ear normal.     Nose: Nose normal.     Mouth/Throat:     Mouth: Mucous membranes are moist.  Eyes:     Extraocular Movements: Extraocular movements intact.     Conjunctiva/sclera: Conjunctivae normal.     Pupils: Pupils are equal, round, and reactive to light.  Neck:     Thyroid: No thyroid mass, thyromegaly or thyroid tenderness.  Cardiovascular:     Rate and Rhythm: Normal rate and regular rhythm.     Pulses: Normal pulses.     Heart sounds: Normal heart sounds.  Pulmonary:     Effort: Pulmonary effort is normal.     Breath sounds: Normal breath sounds.  Abdominal:     General: Abdomen is flat. Bowel sounds are normal.     Palpations: Abdomen is soft.  Musculoskeletal:        General: Normal range of motion.     Cervical back: Normal range of motion and neck supple.  Skin:    General: Skin is warm and dry.     Comments: Diffusely thinning hair but no signs of folliculitis or other scalp conditions.  Hirsutism present on chin.   Neurological:     General: No focal deficit present.     Mental Status: She is alert and oriented to person, place, and time.  Psychiatric:        Mood and Affect: Mood normal.        Behavior: Behavior normal.        Thought Content: Thought content normal.        Judgment: Judgment normal.    BP 116/79   Pulse 94   Temp (!) 97.1 F (36.2 C)   Ht 5' 6"  (1.676 m)   Wt 227 lb 3.2 oz (103.1 kg)   SpO2 99%   BMI 36.67 kg/m  Wt Readings from Last 3 Encounters:  02/12/21 227 lb 3.2 oz (103.1 kg)  09/19/20 218 lb 11.1 oz (99.2 kg)  03/09/20 (!) 218 lb 9.6 oz (99.2  kg)    Health Maintenance Due  Topic Date Due   Pneumococcal Vaccine 88-61 Years old (1 - PCV) Never done   Hepatitis C Screening  Never done   COVID-19 Vaccine (4 - Booster) 09/14/2020    There are no preventive care reminders to display for this patient.   Lab Results  Component Value Date   TSH 3.290 10/04/2019   Lab Results  Component Value Date   WBC 8.3 10/19/2019   HGB 14.3 10/19/2019   HCT 41.9 10/19/2019   MCV 95.2 10/19/2019   PLT 215 10/19/2019   Lab Results  Component Value Date   NA 137 10/19/2019   K 3.9 10/19/2019   CO2 23 10/19/2019   GLUCOSE 107 (H) 10/19/2019   BUN 14 10/19/2019   CREATININE 0.90 10/19/2019   BILITOT 1.2 10/19/2019   ALKPHOS 44 10/19/2019   AST 34 10/19/2019   ALT 36 10/19/2019   PROT 7.8 10/19/2019   ALBUMIN 4.3 10/19/2019   CALCIUM 10.1 10/19/2019   ANIONGAP 11 10/19/2019   GFR 82.48 05/24/2019   Lab Results  Component Value Date   CHOL 209 (H) 10/04/2019   Lab Results  Component Value Date   HDL 46 10/04/2019   Lab Results  Component Value Date   LDLCALC 130 (H) 10/04/2019   Lab Results  Component Value Date  TRIG 184 (H) 10/04/2019   Lab Results  Component Value Date   CHOLHDL 5 05/31/2019   Lab Results  Component Value Date   HGBA1C 5.4 10/04/2019       Assessment & Plan:   Problem List Items Addressed This Visit       Endocrine   Insulin resistance - Primary   Relevant Orders   CBC with Differential/Platelet   Comprehensive metabolic panel   TSH   Lipid panel   Prolactin   Testosterone   FSH   17-Hydroxyprogesterone   hCG, serum, qualitative   VITAMIN D 25 Hydroxy (Vit-D Deficiency, Fractures)   Other Visit Diagnoses     Hair changes       Relevant Orders   CBC with Differential/Platelet   Comprehensive metabolic panel   TSH   Prolactin   Testosterone   FSH   17-Hydroxyprogesterone   hCG, serum, qualitative   VITAMIN D 25 Hydroxy (Vit-D Deficiency, Fractures)   Female  hirsutism       Relevant Orders   CBC with Differential/Platelet   Comprehensive metabolic panel   TSH   Prolactin   Testosterone   FSH   17-Hydroxyprogesterone   hCG, serum, qualitative   VITAMIN D 25 Hydroxy (Vit-D Deficiency, Fractures)   Class 2 severe obesity with serious comorbidity and body mass index (BMI) of 36.0 to 36.9 in adult, unspecified obesity type ([place])       Relevant Orders   CBC with Differential/Platelet   Comprehensive metabolic panel   TSH   Lipid panel   Prolactin   Testosterone   FSH   17-Hydroxyprogesterone   hCG, serum, qualitative   VITAMIN D 25 Hydroxy (Vit-D Deficiency, Fractures)   Other fatigue       Relevant Orders   CBC with Differential/Platelet   Comprehensive metabolic panel   TSH   Prolactin   Testosterone   FSH   17-Hydroxyprogesterone   hCG, serum, qualitative   VITAMIN D 25 Hydroxy (Vit-D Deficiency, Fractures)   Screening for cholesterol level       Relevant Orders   CBC with Differential/Platelet   Comprehensive metabolic panel   TSH   Lipid panel   Prolactin   Testosterone   FSH   17-Hydroxyprogesterone   hCG, serum, qualitative   VITAMIN D 25 Hydroxy (Vit-D Deficiency, Fractures)   Diabetes mellitus screening       Relevant Orders   CBC with Differential/Platelet   Comprehensive metabolic panel   TSH   Prolactin   Testosterone   FSH   17-Hydroxyprogesterone   hCG, serum, qualitative   VITAMIN D 25 Hydroxy (Vit-D Deficiency, Fractures)       1. Insulin resistance  2. Hair changes 3. Female hirsutism  4. Class 2 severe obesity with serious comorbidity and body mass index (BMI) of 36.0 to 36.9 in adult, unspecified obesity type (HCC)  5. Other fatigue  6. Screening for cholesterol level  7. Diabetes mellitus screening  A panel of labs will be drawn today and treated accordingly.  I reassured her that I do not see any problems identified with her scalp.  I am suspecting that she has some hormone changes  going on that are underlying her conglomeration of symptoms.  She knows that weight loss would help as well, but she has continued to struggle with this.  It may be of benefit to go back on metformin or look at other options such as Wejovy or Ozempic.  Again we will look at labs  and then go from there.   Karisma Meiser M Natavia Sublette, PA-C

## 2021-02-12 NOTE — Patient Instructions (Signed)
Good to meet you today.  Please go to the lab and I will call her MyChart with results.

## 2021-02-15 ENCOUNTER — Other Ambulatory Visit: Payer: Self-pay

## 2021-02-15 ENCOUNTER — Other Ambulatory Visit (INDEPENDENT_AMBULATORY_CARE_PROVIDER_SITE_OTHER): Payer: 59

## 2021-02-15 DIAGNOSIS — L680 Hirsutism: Secondary | ICD-10-CM

## 2021-02-15 DIAGNOSIS — E8881 Metabolic syndrome: Secondary | ICD-10-CM | POA: Diagnosis not present

## 2021-02-15 DIAGNOSIS — Z13220 Encounter for screening for lipoid disorders: Secondary | ICD-10-CM

## 2021-02-15 DIAGNOSIS — Z6836 Body mass index (BMI) 36.0-36.9, adult: Secondary | ICD-10-CM

## 2021-02-15 DIAGNOSIS — Z131 Encounter for screening for diabetes mellitus: Secondary | ICD-10-CM | POA: Diagnosis not present

## 2021-02-15 DIAGNOSIS — R5383 Other fatigue: Secondary | ICD-10-CM

## 2021-02-15 DIAGNOSIS — L679 Hair color and hair shaft abnormality, unspecified: Secondary | ICD-10-CM

## 2021-02-15 LAB — CBC WITH DIFFERENTIAL/PLATELET
Basophils Absolute: 0 10*3/uL (ref 0.0–0.1)
Basophils Relative: 0.5 % (ref 0.0–3.0)
Eosinophils Absolute: 0.1 10*3/uL (ref 0.0–0.7)
Eosinophils Relative: 1.5 % (ref 0.0–5.0)
HCT: 40 % (ref 36.0–46.0)
Hemoglobin: 14.2 g/dL (ref 12.0–15.0)
Lymphocytes Relative: 25.9 % (ref 12.0–46.0)
Lymphs Abs: 1.8 10*3/uL (ref 0.7–4.0)
MCHC: 35.4 g/dL (ref 30.0–36.0)
MCV: 95.2 fl (ref 78.0–100.0)
Monocytes Absolute: 0.4 10*3/uL (ref 0.1–1.0)
Monocytes Relative: 6.4 % (ref 3.0–12.0)
Neutro Abs: 4.6 10*3/uL (ref 1.4–7.7)
Neutrophils Relative %: 65.7 % (ref 43.0–77.0)
Platelets: 209 10*3/uL (ref 150.0–400.0)
RBC: 4.2 Mil/uL (ref 3.87–5.11)
RDW: 12.7 % (ref 11.5–15.5)
WBC: 6.9 10*3/uL (ref 4.0–10.5)

## 2021-02-15 LAB — COMPREHENSIVE METABOLIC PANEL
ALT: 25 U/L (ref 0–35)
AST: 19 U/L (ref 0–37)
Albumin: 4.2 g/dL (ref 3.5–5.2)
Alkaline Phosphatase: 53 U/L (ref 39–117)
BUN: 16 mg/dL (ref 6–23)
CO2: 24 mEq/L (ref 19–32)
Calcium: 9.5 mg/dL (ref 8.4–10.5)
Chloride: 106 mEq/L (ref 96–112)
Creatinine, Ser: 0.98 mg/dL (ref 0.40–1.20)
GFR: 70.92 mL/min (ref >60.00)
Glucose, Bld: 99 mg/dL (ref 70–99)
Potassium: 4.1 mEq/L (ref 3.5–5.1)
Sodium: 140 mEq/L (ref 135–145)
Total Bilirubin: 1.1 mg/dL (ref 0.2–1.2)
Total Protein: 6.8 g/dL (ref 6.0–8.3)

## 2021-02-15 LAB — LIPID PANEL
Cholesterol: 220 mg/dL — ABNORMAL HIGH (ref 0–200)
HDL: 46.1 mg/dL (ref >39.00)
LDL Cholesterol: 145 mg/dL — ABNORMAL HIGH (ref 0–99)
NonHDL: 174.24
Total CHOL/HDL Ratio: 5
Triglycerides: 146 mg/dL (ref 0.0–149.0)
VLDL: 29.2 mg/dL (ref 0.0–40.0)

## 2021-02-15 LAB — FOLLICLE STIMULATING HORMONE: FSH: 12.1 m[IU]/mL

## 2021-02-15 LAB — TSH: TSH: 1.87 u[IU]/mL (ref 0.35–5.50)

## 2021-02-15 LAB — VITAMIN D 25 HYDROXY (VIT D DEFICIENCY, FRACTURES): VITD: 34.71 ng/mL (ref 30.00–100.00)

## 2021-02-15 LAB — TESTOSTERONE: Testosterone: 47.36 ng/dL — ABNORMAL HIGH (ref 15.00–40.00)

## 2021-02-19 ENCOUNTER — Encounter: Payer: Self-pay | Admitting: Physician Assistant

## 2021-02-19 LAB — PROLACTIN: Prolactin: 15.5 ng/mL

## 2021-02-19 LAB — HCG, SERUM, QUALITATIVE: Preg, Serum: NEGATIVE

## 2021-02-19 LAB — 17-HYDROXYPROGESTERONE: 17-OH-Progesterone, LC/MS/MS: 14 ng/dL

## 2021-02-22 ENCOUNTER — Other Ambulatory Visit: Payer: Self-pay | Admitting: Physician Assistant

## 2021-02-22 DIAGNOSIS — E288 Other ovarian dysfunction: Secondary | ICD-10-CM

## 2021-03-06 ENCOUNTER — Ambulatory Visit
Admission: RE | Admit: 2021-03-06 | Discharge: 2021-03-06 | Disposition: A | Discharge status: Home / Self Care | Payer: 59 | Source: Ambulatory Visit | Attending: Physician Assistant | Admitting: Physician Assistant

## 2021-03-06 DIAGNOSIS — E281 Androgen excess: Secondary | ICD-10-CM | POA: Diagnosis not present

## 2021-03-06 DIAGNOSIS — R9389 Abnormal findings on diagnostic imaging of other specified body structures: Secondary | ICD-10-CM | POA: Diagnosis not present

## 2021-03-06 DIAGNOSIS — E288 Other ovarian dysfunction: Secondary | ICD-10-CM

## 2021-03-06 DIAGNOSIS — Z780 Asymptomatic menopausal state: Secondary | ICD-10-CM | POA: Diagnosis not present

## 2021-03-14 ENCOUNTER — Other Ambulatory Visit (HOSPITAL_COMMUNITY): Payer: Self-pay

## 2021-03-14 ENCOUNTER — Encounter: Payer: Self-pay | Admitting: Physician Assistant

## 2021-03-15 ENCOUNTER — Other Ambulatory Visit (HOSPITAL_COMMUNITY): Payer: Self-pay

## 2021-03-18 ENCOUNTER — Other Ambulatory Visit: Payer: Self-pay | Admitting: Physician Assistant

## 2021-03-18 ENCOUNTER — Other Ambulatory Visit (HOSPITAL_COMMUNITY): Payer: Self-pay

## 2021-03-18 MED ORDER — METFORMIN HCL ER 500 MG PO TB24
500.0000 mg | ORAL_TABLET | Freq: Two times a day (BID) | ORAL | 2 refills | Status: DC
  Filled 2021-03-18: qty 60, 30d supply, fill #0
  Filled 2021-06-27: qty 60, 30d supply, fill #1

## 2021-03-20 ENCOUNTER — Encounter: Payer: Self-pay | Admitting: Physician Assistant

## 2021-03-21 NOTE — Telephone Encounter (Signed)
Please see message and advise 

## 2021-03-22 NOTE — Telephone Encounter (Signed)
Left message on voicemail to call office.  

## 2021-03-28 ENCOUNTER — Other Ambulatory Visit: Payer: Self-pay

## 2021-03-28 DIAGNOSIS — E288 Other ovarian dysfunction: Secondary | ICD-10-CM

## 2021-04-01 DIAGNOSIS — E349 Endocrine disorder, unspecified: Secondary | ICD-10-CM | POA: Diagnosis not present

## 2021-04-01 DIAGNOSIS — Z3041 Encounter for surveillance of contraceptive pills: Secondary | ICD-10-CM | POA: Diagnosis not present

## 2021-04-10 ENCOUNTER — Other Ambulatory Visit (HOSPITAL_COMMUNITY): Payer: Self-pay

## 2021-04-10 MED FILL — Drospirenone Tab 4 MG: ORAL | 84 days supply | Qty: 84 | Fill #1 | Status: AC

## 2021-04-18 ENCOUNTER — Other Ambulatory Visit (HOSPITAL_COMMUNITY): Payer: Self-pay

## 2021-04-18 ENCOUNTER — Other Ambulatory Visit: Payer: Self-pay | Admitting: Family Medicine

## 2021-04-18 MED ORDER — VALSARTAN-HYDROCHLOROTHIAZIDE 80-12.5 MG PO TABS
1.0000 | ORAL_TABLET | Freq: Every day | ORAL | 0 refills | Status: DC
  Filled 2021-04-18: qty 90, 90d supply, fill #0

## 2021-05-16 ENCOUNTER — Other Ambulatory Visit (HOSPITAL_COMMUNITY): Payer: Self-pay

## 2021-05-16 MED ORDER — VYVANSE 50 MG PO CHEW
1.0000 | CHEWABLE_TABLET | Freq: Every day | ORAL | 0 refills | Status: DC
  Filled 2021-05-16: qty 30, 30d supply, fill #0

## 2021-05-17 ENCOUNTER — Other Ambulatory Visit (HOSPITAL_COMMUNITY): Payer: Self-pay

## 2021-05-27 ENCOUNTER — Other Ambulatory Visit (HOSPITAL_COMMUNITY): Payer: Self-pay

## 2021-05-27 ENCOUNTER — Ambulatory Visit (INDEPENDENT_AMBULATORY_CARE_PROVIDER_SITE_OTHER): Payer: 59 | Admitting: Endocrinology

## 2021-05-27 ENCOUNTER — Other Ambulatory Visit: Payer: Self-pay

## 2021-05-27 VITALS — BP 100/54 | HR 105 | Ht 66.0 in | Wt 225.2 lb

## 2021-05-27 DIAGNOSIS — R635 Abnormal weight gain: Secondary | ICD-10-CM | POA: Diagnosis not present

## 2021-05-27 DIAGNOSIS — E8881 Metabolic syndrome: Secondary | ICD-10-CM | POA: Diagnosis not present

## 2021-05-27 MED ORDER — DEXAMETHASONE 1 MG PO TABS
1.0000 mg | ORAL_TABLET | ORAL | 0 refills | Status: DC
  Filled 2021-05-27: qty 1, 1d supply, fill #0

## 2021-05-27 NOTE — Patient Instructions (Addendum)
A 24-HR urine collection is requested for you today.  We'll let you know about the results.  After you have completed that: You should do a "dexamethasone suppression test."  For this, you would take dexamethasone 1 mg at 10 pm (I have sent a prescription to your pharmacy), then come in for a "cortisol" blood test the next morning before 9 am.  You do not need to be fasting for this test.  We'll do some other tests at the same time.   Depending on the estradiol result, I'll prescribe for you a pill for the hair growth.

## 2021-05-27 NOTE — Progress Notes (Signed)
Subjective:    Patient ID: Crystal Madden, female    DOB: [birth year], 45 y.o.   MRN: [id number]  HPI Pt is referred by Inda Coke, PA, for elevated testosterone.  Pt had menarche at age 82.  She has always had regular menses. She is G3P3.  She reports 2 years of worsening of chronic terminal hair on the chin, and loss of hair on the crown.  She does not want any further pregnancy  She has been on oral contraceptives since age 44, except for pregnancies.  OC's have been without estradiol since 2021, due to HTN. No recent menses.   Past Medical History:  Diagnosis Date   Anxiety    Depression    Diabetes mellitus without complication (HCC)    High blood pressure    Hyperlipidemia    Lower back pain    Obesity (BMI 30-39.9)     Past Surgical History:  Procedure Laterality Date   BLADDER SUSPENSION     CESAREAN SECTION     LUMBAR MICRODISCECTOMY  2018   TONSILLECTOMY  2000    Social History   Socioeconomic History   Marital status: Married    Spouse name: ryan   Number of children: Not on file   Years of education: Not on file   Highest education level: Not on file  Occupational History   Occupation: Quality Improvement    Employer: [employer]  Tobacco Use   Smoking status: Former    Types: Cigarettes    Quit date: 08/27/2004    Years since quitting: 16.7   Smokeless tobacco: Never  Vaping Use   Vaping Use: Never used  Substance and Sexual Activity   Alcohol use: Yes    Comment: Ocassional.   Drug use: No   Sexual activity: Not on file  Other Topics Concern   Not on file  Social History Narrative   right handed   Social Determinants of Health   Financial Resource Strain: Not on file  Food Insecurity: Not on file  Transportation Needs: Not on file  Physical Activity: Not on file  Stress: Not on file  Social Connections: Not on file  Intimate Partner Violence: Not on file    Current Outpatient Medications on File Prior to Visit  Medication Sig  Dispense Refill   ALPRAZolam (XANAX) 0.5 MG tablet TAKE 1 TABLET BY MOUTH IN THE AM AND 1/2 TABLET IN THE AFTERNOON (Patient taking differently: Take 0.5 mg by mouth daily as needed for anxiety.) 30 tablet 2   AMBULATORY NON FORMULARY MEDICATION Single glucometer with lancets, test strips. Test daily R73.03. Prediabetes 1 each 0   bimatoprost (LATISSE) 0.03 % ophthalmic solution Place 1 drop into both eyes See admin instructions. PLACE 1 DROP ONTO APPLICATOR & APPLY EVENLY ALONG SKIN OF UPPER LID AT BASE OF LASHES TO BOTH EYES AT BEDTIME 3 mL 12   blood glucose meter kit and supplies Used to check blood sugars daily 1 each 0   Drospirenone 4 MG TABS TAKE 1 TABLET BY MOUTH ONCE A DAY 84 tablet 3   glucose blood test strip Used to check blood sugars daily 100 each 0   Lancets MISC Used to check blood sugars daily 100 each 0   Lisdexamfetamine Dimesylate (VYVANSE) 50 MG CHEW Chew 1 tablet (50 mg) by mouth daily. May fill 60 days after date prescribed. Fill 01/14/21 30 tablet 0   Lisdexamfetamine Dimesylate (VYVANSE) 50 MG CHEW Chew 1 tablet (50 mg) by mouth daily. [place]  tablet 0   Lisdexamfetamine Dimesylate (VYVANSE) 50 MG CHEW Chew 1 tablet (50 mg) by mouth daily. 30 tablet 0   Lisdexamfetamine Dimesylate (VYVANSE) 50 MG CHEW Chew 1 tablet by mouth daily. 30 tablet 0   metFORMIN (GLUCOPHAGE XR) 500 MG 24 hr tablet Take 1 tablet (500 mg total) by mouth 2 (two) times daily. 60 tablet 2   valsartan-hydrochlorothiazide (DIOVAN-HCT) 80-12.5 MG tablet Take 1 tablet by mouth daily. 90 tablet 0   Lisdexamfetamine Dimesylate 40 MG CHEW CHEW 1 TABLET BY MOUTH DAILY 30 tablet 0   No current facility-administered medications on file prior to visit.    Allergies  Allergen Reactions   Amlodipine Swelling    Lower leg swelling when increased to 10 mg    Family History  Problem Relation Age of Onset   Hypertension Mother    Hyperlipidemia Mother    Obesity Mother    COPD Father    Cancer Father    Heart  disease Father    Hypertension Father    Stroke Father    Cancer Maternal Grandfather    Cancer Paternal Grandmother    Colon cancer Neg Hx     BP (!) 100/54 (BP Location: Right Arm, Patient Position: Sitting, Cuff Size: Large)   Pulse (!) 105   Ht 5' 6"  (1.676 m)   Wt 225 lb 3.2 oz (102.2 kg)   SpO2 98%   BMI 36.35 kg/m    Review of Systems She has chronic weight gain, and salt craving.      Objective:   Physical Exam VS: see vs page GEN: no distress HEAD: head: no deformity eyes: no periorbital swelling, no proptosis.   external nose and ears are normal NECK: supple, thyroid is not enlarged CHEST WALL: no deformity LUNGS: clear to auscultation CV: reg rate and rhythm, no murmur.  MUSCULOSKELETAL: gait is normal and steady EXTEMITIES: no deformity.  no leg edema.  NEURO:  readily moves all 4's.  sensation is intact to touch on all 4's SKIN:  Normal texture and temperature.  No rash or suspicious lesion is visible.  Terminal hair is noted on the chin.   NODES:  None palpable at the neck.  PSYCH: alert, well-oriented.  Does not appear anxious nor depressed.     [place] (2022): ovaries are normal.    CT (2017) no mention is made of the adrenals.    Lab Results  Component Value Date   TESTOSTERONE 47.36 (H) 02/15/2021   Lab Results  Component Value Date   TSH 1.87 02/15/2021   T3TOTAL 152 10/04/2019   Prolactin=16 FSH=12 17-OHP=14 Random glucose=143  Lab Results  Component Value Date   CREATININE 0.98 02/15/2021   BUN 16 02/15/2021   NA 140 02/15/2021   K 4.1 02/15/2021   CL 106 02/15/2021   CO2 24 02/15/2021    Lab Results  Component Value Date   HGBA1C 5.4 10/04/2019    I have reviewed outside records, and summarized:  Pt was noted to have elevated testosterone, and referred here.  She was noted to have hirsutism and insulin resistance.      Assessment & Plan:  Hirsutism, and other sxs, new to me, uncertain etiology and prognosis.    Patient  Instructions  A 24-HR urine collection is requested for you today.  We'll let you know about the results.  After you have completed that: You should do a "dexamethasone suppression test."  For this, you would take dexamethasone 1 mg at 10 pm (  I have sent a prescription to your pharmacy), then come in for a "cortisol" blood test the next morning before 9 am.  You do not need to be fasting for this test.  We'll do some other tests at the same time.   Depending on the estradiol result, I'll prescribe for you a pill for the hair growth.

## 2021-05-29 ENCOUNTER — Other Ambulatory Visit: Payer: 59

## 2021-05-29 DIAGNOSIS — R635 Abnormal weight gain: Secondary | ICD-10-CM | POA: Diagnosis not present

## 2021-05-30 ENCOUNTER — Other Ambulatory Visit (INDEPENDENT_AMBULATORY_CARE_PROVIDER_SITE_OTHER): Payer: 59

## 2021-05-30 ENCOUNTER — Other Ambulatory Visit: Payer: Self-pay

## 2021-05-30 DIAGNOSIS — E8881 Metabolic syndrome: Secondary | ICD-10-CM

## 2021-05-30 DIAGNOSIS — R635 Abnormal weight gain: Secondary | ICD-10-CM

## 2021-05-30 LAB — HEMOGLOBIN A1C: Hgb A1c MFr Bld: 5.6 % (ref 4.6–6.5)

## 2021-05-30 LAB — CORTISOL: Cortisol, Plasma: 0.5 ug/dL

## 2021-06-04 LAB — CORTISOL, URINE, 24 HOUR
24 Hour urine volume (VMAHVA): 2500 mL
CREATININE, URINE: 1.88 g/(24.h) (ref 0.50–2.15)
Cortisol (Ur), Free: 19.3 mcg/24 h (ref 4.0–50.0)

## 2021-06-06 ENCOUNTER — Encounter: Payer: Self-pay | Admitting: Endocrinology

## 2021-06-09 LAB — ESTRADIOL, FREE
Estradiol, Free: 0.31 pg/mL
Estradiol: 16 pg/mL

## 2021-06-10 ENCOUNTER — Other Ambulatory Visit: Payer: Self-pay | Admitting: Endocrinology

## 2021-06-10 DIAGNOSIS — I10 Essential (primary) hypertension: Secondary | ICD-10-CM

## 2021-06-10 MED ORDER — SPIRONOLACTONE 50 MG PO TABS
50.0000 mg | ORAL_TABLET | Freq: Every day | ORAL | 3 refills | Status: DC
  Filled 2021-06-10: qty 90, 90d supply, fill #0

## 2021-06-11 ENCOUNTER — Other Ambulatory Visit (HOSPITAL_COMMUNITY): Payer: Self-pay

## 2021-06-12 ENCOUNTER — Other Ambulatory Visit (HOSPITAL_COMMUNITY): Payer: Self-pay

## 2021-06-13 DIAGNOSIS — Z79899 Other long term (current) drug therapy: Secondary | ICD-10-CM | POA: Diagnosis not present

## 2021-06-13 DIAGNOSIS — F902 Attention-deficit hyperactivity disorder, combined type: Secondary | ICD-10-CM | POA: Diagnosis not present

## 2021-06-14 DIAGNOSIS — F902 Attention-deficit hyperactivity disorder, combined type: Secondary | ICD-10-CM | POA: Diagnosis not present

## 2021-06-27 ENCOUNTER — Other Ambulatory Visit (HOSPITAL_COMMUNITY): Payer: Self-pay

## 2021-06-27 MED FILL — Drospirenone Tab 4 MG: ORAL | 84 days supply | Qty: 84 | Fill #2 | Status: CN

## 2021-07-01 ENCOUNTER — Other Ambulatory Visit (HOSPITAL_COMMUNITY): Payer: Self-pay

## 2021-07-01 MED FILL — Drospirenone Tab 4 MG: ORAL | 84 days supply | Qty: 84 | Fill #2 | Status: AC

## 2021-07-09 ENCOUNTER — Encounter: Payer: Self-pay | Admitting: Physician Assistant

## 2021-07-15 ENCOUNTER — Ambulatory Visit (INDEPENDENT_AMBULATORY_CARE_PROVIDER_SITE_OTHER): Payer: 59 | Admitting: Physician Assistant

## 2021-07-15 ENCOUNTER — Other Ambulatory Visit: Payer: Self-pay

## 2021-07-15 ENCOUNTER — Encounter: Payer: Self-pay | Admitting: Physician Assistant

## 2021-07-15 ENCOUNTER — Other Ambulatory Visit (HOSPITAL_COMMUNITY): Payer: Self-pay

## 2021-07-15 VITALS — BP 119/77 | HR 86 | Temp 98.0°F | Ht 66.0 in | Wt 231.0 lb

## 2021-07-15 DIAGNOSIS — E8881 Metabolic syndrome: Secondary | ICD-10-CM | POA: Diagnosis not present

## 2021-07-15 MED ORDER — VYVANSE 60 MG PO CHEW
1.0000 | CHEWABLE_TABLET | Freq: Every day | ORAL | 0 refills | Status: DC
  Filled 2021-07-15: qty 30, 30d supply, fill #0

## 2021-07-15 NOTE — Patient Instructions (Signed)
It was great to see you!  Start weekly 2.5 mg mounjaro; hold on to the coupon!  Let me know via Mychart if you'd like to continue this after you have done this for a few weeks  Take care,  Inda Coke PA-C

## 2021-07-15 NOTE — Progress Notes (Signed)
Crystal Madden is a 45 y.o. female here for insulin resistance.   History of Present Illness:   Chief Complaint  Patient presents with   insulin resistance    Pt came in to discuss resistance to insulin injectable follow up; discussed options prior visit    HPI  Insulin Resistance Crystal Madden expresses that since her previous visit with Theresa Duty, PA in July and Dr. Loanne Drilling in October, she has considered trying an injectable medication to treat her insulin resistance. Previously she was taking metformin 500 mg BID daily, but stated she has since stopped taking the medication. While she was compliant with the medication, she didn't report any adverse effects such as diarrhea. Although she didn't have any symptoms while taking the medication, she did notice that her weight loss hit a plateau despite continued exercise and improved dietary choices.    Past Medical History:  Diagnosis Date   Anxiety    Depression    Diabetes mellitus without complication (HCC)    High blood pressure    Hyperlipidemia    Lower back pain    Obesity (BMI 30-39.9)      Social History   Tobacco Use   Smoking status: Former    Types: Cigarettes    Quit date: 08/27/2004    Years since quitting: 16.8   Smokeless tobacco: Never  Vaping Use   Vaping Use: Never used  Substance Use Topics   Alcohol use: Yes    Comment: Ocassional.   Drug use: No    Past Surgical History:  Procedure Laterality Date   BLADDER SUSPENSION     CESAREAN SECTION     LUMBAR MICRODISCECTOMY  2018   TONSILLECTOMY  2000    Family History  Problem Relation Age of Onset   Hypertension Mother    Hyperlipidemia Mother    Obesity Mother    COPD Father    Cancer Father    Heart disease Father    Hypertension Father    Stroke Father    Cancer Maternal Grandfather    Cancer Paternal Grandmother    Colon cancer Neg Hx     Allergies  Allergen Reactions   Amlodipine Swelling    Lower leg swelling when increased  to 10 mg    Current Medications:   Current Outpatient Medications:    ALPRAZolam (XANAX) 0.5 MG tablet, TAKE 1 TABLET BY MOUTH IN THE AM AND 1/2 TABLET IN THE AFTERNOON (Patient taking differently: Take 0.5 mg by mouth daily as needed for anxiety.), Disp: 30 tablet, Rfl: 2   AMBULATORY NON FORMULARY MEDICATION, Single glucometer with lancets, test strips. Test daily R73.03. Prediabetes, Disp: 1 each, Rfl: 0   bimatoprost (LATISSE) 0.03 % ophthalmic solution, Place 1 drop into both eyes See admin instructions. PLACE 1 DROP ONTO APPLICATOR & APPLY EVENLY ALONG SKIN OF UPPER LID AT BASE OF LASHES TO BOTH EYES AT BEDTIME, Disp: 3 mL, Rfl: 12   blood glucose meter kit and supplies, Used to check blood sugars daily, Disp: 1 each, Rfl: 0   dexamethasone (DECADRON) 1 MG tablet, Take 1 tablet (1 mg total) by mouth at 9-10 PM, the night before blood test., Disp: 1 tablet, Rfl: 0   Drospirenone 4 MG TABS, TAKE 1 TABLET BY MOUTH ONCE A DAY, Disp: 84 tablet, Rfl: 3   glucose blood test strip, Used to check blood sugars daily, Disp: 100 each, Rfl: 0   Lancets MISC, Used to check blood sugars daily, Disp: 100 each, Rfl: 0  Lisdexamfetamine Dimesylate (VYVANSE) 50 MG CHEW, Chew 1 tablet (50 mg) by mouth daily. May fill 60 days after date prescribed. Fill 01/14/21, Disp: 30 tablet, Rfl: 0   Lisdexamfetamine Dimesylate (VYVANSE) 50 MG CHEW, Chew 1 tablet (50 mg) by mouth daily., Disp: 30 tablet, Rfl: 0   Lisdexamfetamine Dimesylate (VYVANSE) 50 MG CHEW, Chew 1 tablet (50 mg) by mouth daily., Disp: 30 tablet, Rfl: 0   Lisdexamfetamine Dimesylate (VYVANSE) 50 MG CHEW, Chew 1 tablet by mouth daily., Disp: 30 tablet, Rfl: 0   Lisdexamfetamine Dimesylate (VYVANSE) 60 MG CHEW, Chew 1 tablet by mouth daily., Disp: 30 tablet, Rfl: 0   metFORMIN (GLUCOPHAGE XR) 500 MG 24 hr tablet, Take 1 tablet (500 mg total) by mouth 2 (two) times daily., Disp: 60 tablet, Rfl: 2   spironolactone (ALDACTONE) 50 MG tablet, Take 1 tablet (50  mg total) by mouth daily., Disp: 90 tablet, Rfl: 3   valsartan-hydrochlorothiazide (DIOVAN-HCT) 80-12.5 MG tablet, Take 1 tablet by mouth daily., Disp: 90 tablet, Rfl: 0   Lisdexamfetamine Dimesylate 40 MG CHEW, CHEW 1 TABLET BY MOUTH DAILY, Disp: 30 tablet, Rfl: 0   Review of Systems:   ROS Negative unless otherwise specified per HPI. Vitals:   Vitals:   07/15/21 1057  BP: 119/77  Pulse: 86  Temp: 98 F (36.7 C)  TempSrc: Temporal  SpO2: 98%  Weight: 231 lb (104.8 kg)  Height: _0  (1.676 m)     Body mass index is 37.28 kg/m.  Physical Exam:   Physical Exam Vitals and nursing note reviewed.  Constitutional:      General: She is not in acute distress.    Appearance: She is well-developed. She is not ill-appearing or toxic-appearing.  Cardiovascular:     Rate and Rhythm: Normal rate and regular rhythm.     Pulses: Normal pulses.     Heart sounds: Normal heart sounds, S1 normal and S2 normal.  Pulmonary:     Effort: Pulmonary effort is normal.     Breath sounds: Normal breath sounds.  Skin:    General: Skin is warm and dry.  Neurological:     Mental Status: She is alert.     GCS: GCS eye subscore is 4. GCS verbal subscore is 5. GCS motor subscore is 6.  Psychiatric:        Speech: Speech normal.        Behavior: Behavior normal. Behavior is cooperative.    Assessment and Plan:   Insulin Resistance Trial Mounjaro 2.5 mg weekly injection Sample provided for first 4 weeks Advised patient to follow up through [facility] in terms of continued use of medication Follow up sooner if concerns occur    I,Havlyn C Ratchford,acting as a scribe for [employer], PA.,have documented all relevant documentation on the behalf of Inda Coke, PA,as directed by  Inda Coke, PA while in the presence of Inda Coke, [place].  I, Inda Coke, [place], have reviewed all documentation for this visit. The documentation on 07/15/21 for the exam, diagnosis, procedures, and  orders are all accurate and complete.   Inda Coke, PA-C

## 2021-07-22 ENCOUNTER — Other Ambulatory Visit: Payer: Self-pay | Admitting: Family Medicine

## 2021-07-22 ENCOUNTER — Other Ambulatory Visit (HOSPITAL_COMMUNITY): Payer: Self-pay

## 2021-07-22 DIAGNOSIS — D225 Melanocytic nevi of trunk: Secondary | ICD-10-CM | POA: Diagnosis not present

## 2021-07-22 DIAGNOSIS — L820 Inflamed seborrheic keratosis: Secondary | ICD-10-CM | POA: Diagnosis not present

## 2021-07-22 DIAGNOSIS — L821 Other seborrheic keratosis: Secondary | ICD-10-CM | POA: Diagnosis not present

## 2021-07-22 DIAGNOSIS — L814 Other melanin hyperpigmentation: Secondary | ICD-10-CM | POA: Diagnosis not present

## 2021-07-22 DIAGNOSIS — L812 Freckles: Secondary | ICD-10-CM | POA: Diagnosis not present

## 2021-07-22 MED ORDER — VALSARTAN-HYDROCHLOROTHIAZIDE 80-12.5 MG PO TABS
1.0000 | ORAL_TABLET | Freq: Every day | ORAL | 0 refills | Status: DC
  Filled 2021-07-22: qty 90, 90d supply, fill #0

## 2021-08-05 ENCOUNTER — Encounter: Payer: Self-pay | Admitting: Physician Assistant

## 2021-08-06 ENCOUNTER — Other Ambulatory Visit (HOSPITAL_COMMUNITY): Payer: Self-pay

## 2021-08-06 ENCOUNTER — Telehealth: Payer: 59 | Admitting: Physician Assistant

## 2021-08-06 DIAGNOSIS — B9689 Other specified bacterial agents as the cause of diseases classified elsewhere: Secondary | ICD-10-CM

## 2021-08-06 DIAGNOSIS — J019 Acute sinusitis, unspecified: Secondary | ICD-10-CM

## 2021-08-06 MED ORDER — TIRZEPATIDE 5 MG/0.5ML ~~LOC~~ SOAJ
5.0000 mg | SUBCUTANEOUS | 0 refills | Status: DC
Start: 2021-08-06 — End: 2021-09-09
  Filled 2021-08-06: qty 2, 28d supply, fill #0

## 2021-08-06 MED ORDER — AMOXICILLIN-POT CLAVULANATE 875-125 MG PO TABS: 1.0000 | ORAL_TABLET | Freq: Two times a day (BID) | ORAL | 0 refills | Status: DC

## 2021-08-06 NOTE — Progress Notes (Signed)

## 2021-08-13 ENCOUNTER — Other Ambulatory Visit (HOSPITAL_COMMUNITY): Payer: Self-pay

## 2021-08-14 ENCOUNTER — Other Ambulatory Visit (HOSPITAL_COMMUNITY): Payer: Self-pay

## 2021-08-14 MED ORDER — VYVANSE 60 MG PO CHEW
60.0000 mg | CHEWABLE_TABLET | Freq: Every day | ORAL | 0 refills | Status: DC
  Filled 2021-08-14: qty 30, 30d supply, fill #0

## 2021-09-03 ENCOUNTER — Encounter: Payer: Self-pay | Admitting: Physician Assistant

## 2021-09-04 ENCOUNTER — Other Ambulatory Visit: Payer: Self-pay

## 2021-09-04 ENCOUNTER — Ambulatory Visit (INDEPENDENT_AMBULATORY_CARE_PROVIDER_SITE_OTHER): Payer: 59 | Admitting: Physician Assistant

## 2021-09-04 ENCOUNTER — Encounter: Payer: Self-pay | Admitting: Physician Assistant

## 2021-09-04 VITALS — BP 110/74 | HR 107 | Temp 97.5°F | Ht 66.0 in | Wt 222.0 lb

## 2021-09-04 DIAGNOSIS — Z6835 Body mass index (BMI) 35.0-35.9, adult: Secondary | ICD-10-CM | POA: Diagnosis not present

## 2021-09-04 DIAGNOSIS — M25551 Pain in right hip: Secondary | ICD-10-CM

## 2021-09-04 NOTE — Progress Notes (Signed)
Crystal Madden is a 46 y.o. female here for hip pain.  History of Present Illness:   Chief Complaint  Patient presents with   Hip Pain    Pt c/o right hip pain, tender to touch and now has some swelling in the area, pt fell in Sept.   Right Hip Pain Gentry presents with right hip pain that has been onset for about 4 months. Initial pain occurred following a fall down some stairs in September of 2022. Pt states she noticed a giant bruise on her hip/butt, but didn't experience too much pain.  Due to not experiencing any trouble walking or decreased ROM, she didn't believe she needed to have this evaluated at that time.   Although she still felt tenderness upon palpation to the area as time went on, it wasn't until she noticed slight swelling recently that she decided to have this looked at. She has taken ibuprofen but hasn't noticed a difference in the swelling. At this time she is interested in participating in PT or following up with sports medicine. Denies decreased ROM, numbness/tingling down leg, or trouble ambulating.   Obesity  Pt is currently compliant with using mounjaro 5 mg weekly injection with no adverse effects. She does experience some occasional constipation and her gag reflex is more pronounced upon brushing her teeth, but this is not an issue for her. Despite this she finds the medication to be beneficial by quieting her binging thoughts and controlling her appetite. She is considering the option of increasing her current dosage, but would like to wait this out. Denies ongoing sx.    Past Medical History:  Diagnosis Date   Anxiety    Depression    Diabetes mellitus without complication (HCC)    High blood pressure    Hyperlipidemia    Lower back pain    Obesity (BMI 30-39.9)      Social History   Tobacco Use   Smoking status: Former    Types: Cigarettes    Quit date: 08/27/2004    Years since quitting: 17.0   Smokeless tobacco: Never  Vaping Use   Vaping  Use: Never used  Substance Use Topics   Alcohol use: Yes    Comment: Ocassional.   Drug use: No    Past Surgical History:  Procedure Laterality Date   BLADDER SUSPENSION     CESAREAN SECTION     LUMBAR MICRODISCECTOMY  2018   TONSILLECTOMY  2000    Family History  Problem Relation Age of Onset   Hypertension Mother    Hyperlipidemia Mother    Obesity Mother    COPD Father    Cancer Father    Heart disease Father    Hypertension Father    Stroke Father    Cancer Maternal Grandfather    Cancer Paternal Grandmother    Colon cancer Neg Hx     Allergies  Allergen Reactions   Amlodipine Swelling    Lower leg swelling when increased to 10 mg    Current Medications:   Current Outpatient Medications:    ALPRAZolam (XANAX) 0.5 MG tablet, TAKE 1 TABLET BY MOUTH IN THE AM AND 1/2 TABLET IN THE AFTERNOON (Patient taking differently: Take 0.5 mg by mouth daily as needed for anxiety.), Disp: 30 tablet, Rfl: 2   AMBULATORY NON FORMULARY MEDICATION, Single glucometer with lancets, test strips. Test daily R73.03. Prediabetes, Disp: 1 each, Rfl: 0   bimatoprost (LATISSE) 0.03 % ophthalmic solution, Place 1 drop into both eyes See  admin instructions. PLACE 1 DROP ONTO APPLICATOR & APPLY EVENLY ALONG SKIN OF UPPER LID AT BASE OF LASHES TO BOTH EYES AT BEDTIME, Disp: 3 mL, Rfl: 12   blood glucose meter kit and supplies, Used to check blood sugars daily, Disp: 1 each, Rfl: 0   Drospirenone 4 MG TABS, TAKE 1 TABLET BY MOUTH ONCE A DAY, Disp: 84 tablet, Rfl: 3   glucose blood test strip, Used to check blood sugars daily, Disp: 100 each, Rfl: 0   Lancets MISC, Used to check blood sugars daily, Disp: 100 each, Rfl: 0   Lisdexamfetamine Dimesylate (VYVANSE) 60 MG CHEW, Chew 1 tablet by mouth daily., Disp: 30 tablet, Rfl: 0   tirzepatide (MOUNJARO) 5 MG/0.5ML Pen, Inject 5 mg into the skin once a week., Disp: 2 mL, Rfl: 0   valsartan-hydrochlorothiazide (DIOVAN-HCT) 80-12.5 MG tablet, Take 1  tablet by mouth daily., Disp: 90 tablet, Rfl: 0   Review of Systems:   ROS Negative unless otherwise specified per HPI.  Vitals:   Vitals:   09/04/21 1051  BP: 110/74  Pulse: (!) 107  Temp: (!) 97.5 F (36.4 C)  TempSrc: Temporal  SpO2: 98%  Weight: 222 lb (100.7 kg)  Height: 5' 6"  (1.676 m)     Body mass index is 35.83 kg/m.  Physical Exam:   Physical Exam Vitals and nursing note reviewed.  Constitutional:      General: She is not in acute distress.    Appearance: She is well-developed. She is not ill-appearing or toxic-appearing.  Cardiovascular:     Rate and Rhythm: Normal rate and regular rhythm.     Pulses: Normal pulses.     Heart sounds: Normal heart sounds, S1 normal and S2 normal.  Pulmonary:     Effort: Pulmonary effort is normal.     Breath sounds: Normal breath sounds.  Musculoskeletal:     Right hip: Tenderness (tenderness to lateral aspect of R hip) present. No bony tenderness. Normal range of motion. Normal strength.     Left hip: Normal.  Skin:    General: Skin is warm and dry.  Neurological:     Mental Status: She is alert.     GCS: GCS eye subscore is 4. GCS verbal subscore is 5. GCS motor subscore is 6.  Psychiatric:        Speech: Speech normal.        Behavior: Behavior normal. Behavior is cooperative.    Assessment and Plan:   Right Hip Pain Given ongoing pain and swelling, and MOI was trauma, recommend imaging Suspect patient would best be served at sports medicine to formally evaluate and determine most effective imaging modality (u/s, xray, etc.) - she is agreeable Referral placed Declines any medication intervention at this time  Class 2 severe obesity with serious comorbidity and body mass index (BMI) of 35.0 to 35.9 in adult, unspecified obesity type (HCC) Improving; 9 pound decrease Continue mounjaro 5 mg weekly injection Discussed possibility of  increase to 7.5 mg-- she will send [place] a mychart message if she would like to  increase Follow up in next 3 months or so  I,Havlyn C Ratchford,acting as a Education administrator for [employer], PA.,have documented all relevant documentation on the behalf of Inda Coke, PA,as directed by  Inda Coke, PA while in the presence of Inda Coke, [place].  I, Inda Coke, [place], have reviewed all documentation for this visit. The documentation on 09/04/21 for the exam, diagnosis, procedures, and orders are all accurate and complete.  Inda Coke, PA-C

## 2021-09-05 NOTE — Progress Notes (Signed)
I, Wendy Poet, LAT, ATC, am serving as scribe for Dr. Lynne Leader.  Crystal Madden is a 46 y.o. female who presents to [place] at [facility] today for c/o [name] after suffering a fall down some stairs in Sept 2022, landing on her R hip/glute.  She locates her pain to her R lateral hip /GT.  She was last seen by Dr. Georgina Snell on 01/05/20 for f/u of LBP, neck and shoulder pain.  Radiating pain: yes into her R buttock Swelling: yes in her R lateral hip. Aggravating factors: prolonged sitting; pressure to the area Treatments tried: IBU,  Diagnostic testing: L-spine MRI- 10/29/19   Pertinent review of systems: No fevers or chills  Relevant historical information: History of chronic axial back pain ultimately receiving facet medial branch block and ablations L3-L4 bilaterally 2021.   Exam:  BP 110/70 (BP Location: Left Arm, Patient Position: Sitting, Cuff Size: Normal)    Pulse 96    Ht 5\' 6"  (1.676 m)    Wt 224 lb 9.6 oz (101.9 kg)    SpO2 98%    BMI 36.25 kg/m  General: Well Developed, well nourished, and in no acute distress.   MSK: Right hip: Normal-appearing Normal motion. Tender palpation greater trochanter. Hip abduction strength 5/5 external rotation strength 4+/5.  Resisted hip external rotation and abduction strength with reduced pain. Normal gait.    Lab and Radiology Results  Procedure: Real-time Ultrasound Guided Injection of right hip greater trochanter bursa Device: Philips Affiniti 50G Images permanently stored and available for review in PACS Verbal informed consent obtained.  Discussed risks and benefits of procedure. Warned about infection bleeding damage to structures skin hypopigmentation and fat atrophy among others. Patient expresses understanding and agreement Time-out conducted.   Noted no overlying erythema, induration, or other signs of local infection.   Skin prepped in a sterile fashion.   Local anesthesia: Topical Ethyl  chloride.   With sterile technique and under real time ultrasound guidance: 40 mg of Kenalog and 2 mL of Marcaine injected into right hip greater trochanter bursa. Fluid seen entering the bursa.   Completed without difficulty   Pain immediately resolved suggesting accurate placement of the medication.   Advised to call if fevers/chills, erythema, induration, drainage, or persistent bleeding.   Images permanently stored and available for review in the ultrasound unit.  Impression: Technically successful ultrasound guided injection.   X-ray images right hip obtained today personally and independently interpreted Lucency at the trochanter junction to femoral neck visible without definitive fracture line or displacement. No definitive displaced fracture visible.  No severe hip DJD. Avulsion fleck/enthesopathy at greater trochanter insertion site. Await for radiology review     Assessment and Plan: 46 y.o. female with right lateral hip pain after fall in September.  X-ray is abnormal appearing to my read however radiology overread is still pending.  Patient had a greater trochanter bursa injection today and has been referred to physical therapy.  If radiology is concerned about the x-ray may consider early MRI.  Certainly will get MRI if not improved.  Recheck in 6 weeks.   PDMP not reviewed this encounter. Orders Placed This Encounter  Procedures   [place] LIMITED JOINT SPACE STRUCTURES LOW RIGHT(NO LINKED CHARGES)    Order Specific Question:   Reason for Exam (SYMPTOM  OR DIAGNOSIS REQUIRED)    Answer:   [name]    Order Specific Question:   Preferred imaging location?    Answer:  [facility]   DG HIP UNILAT W OR W/O PELVIS 2-3 VIEWS RIGHT    Standing Status:   Future    Number of Occurrences:   1    Standing Expiration Date:   09/06/2022    Order Specific Question:   Reason for Exam (SYMPTOM  OR DIAGNOSIS REQUIRED)    Answer:   right hip pain    Order Specific  Question:   Preferred imaging location?    Answer:   [facility]    Order Specific Question:   Is patient pregnant?    Answer:   No   No orders of the defined types were placed in this encounter.    Discussed warning signs or symptoms. Please see discharge instructions. Patient expresses understanding.   The above documentation has been reviewed and is accurate and complete Lynne Leader, M.D.

## 2021-09-06 ENCOUNTER — Ambulatory Visit: Payer: Self-pay

## 2021-09-06 ENCOUNTER — Ambulatory Visit (INDEPENDENT_AMBULATORY_CARE_PROVIDER_SITE_OTHER): Payer: 59

## 2021-09-06 ENCOUNTER — Encounter: Payer: Self-pay | Admitting: Family Medicine

## 2021-09-06 ENCOUNTER — Other Ambulatory Visit: Payer: Self-pay

## 2021-09-06 ENCOUNTER — Ambulatory Visit (INDEPENDENT_AMBULATORY_CARE_PROVIDER_SITE_OTHER): Payer: 59 | Admitting: Family Medicine

## 2021-09-06 VITALS — BP 110/70 | HR 96 | Ht 66.0 in | Wt 224.6 lb

## 2021-09-06 DIAGNOSIS — M25551 Pain in right hip: Secondary | ICD-10-CM | POA: Diagnosis not present

## 2021-09-06 IMAGING — DX DG HIP (WITH OR WITHOUT PELVIS) 2-3V*R*
3 series · 3 of 3 positions shown · non-contrast
Comparison: None.

CLINICAL DATA: Right hip pain.

EXAM:
DG HIP (WITH OR WITHOUT PELVIS) 2-3V RIGHT

[pelvis ap]
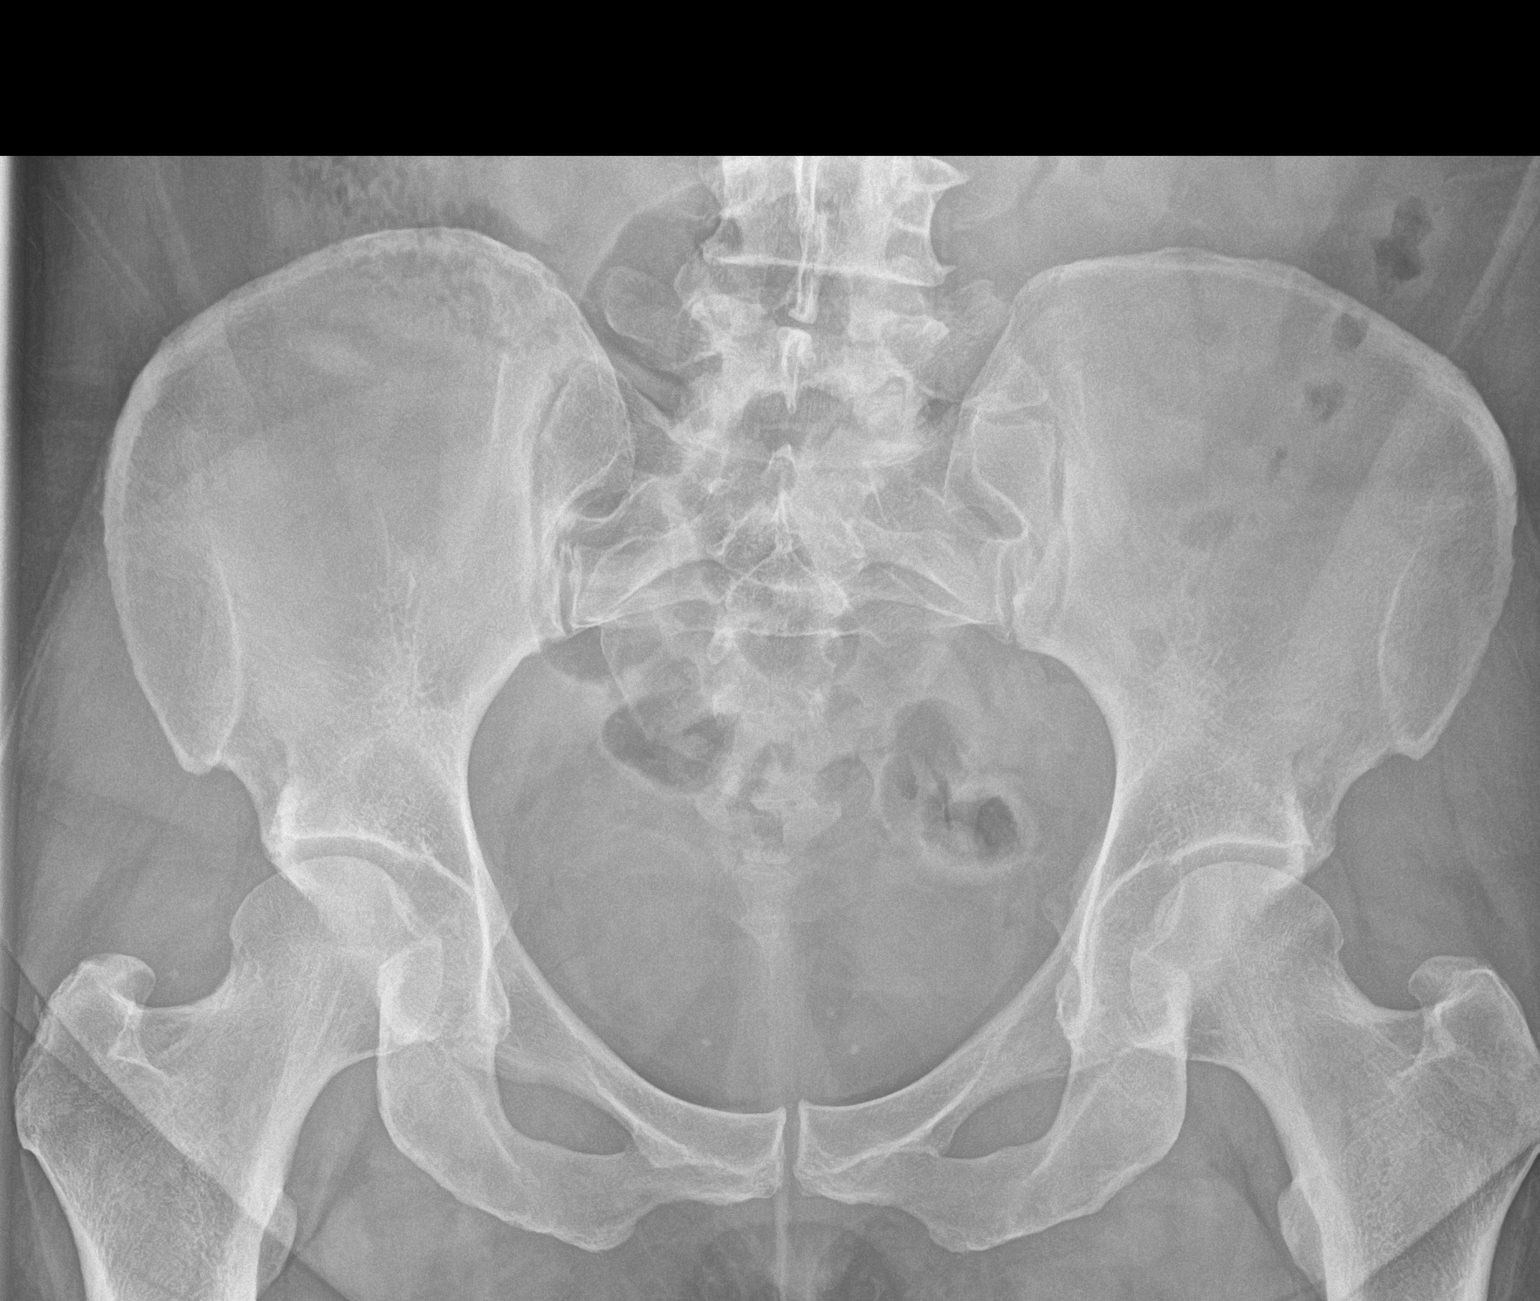

[hip ap]
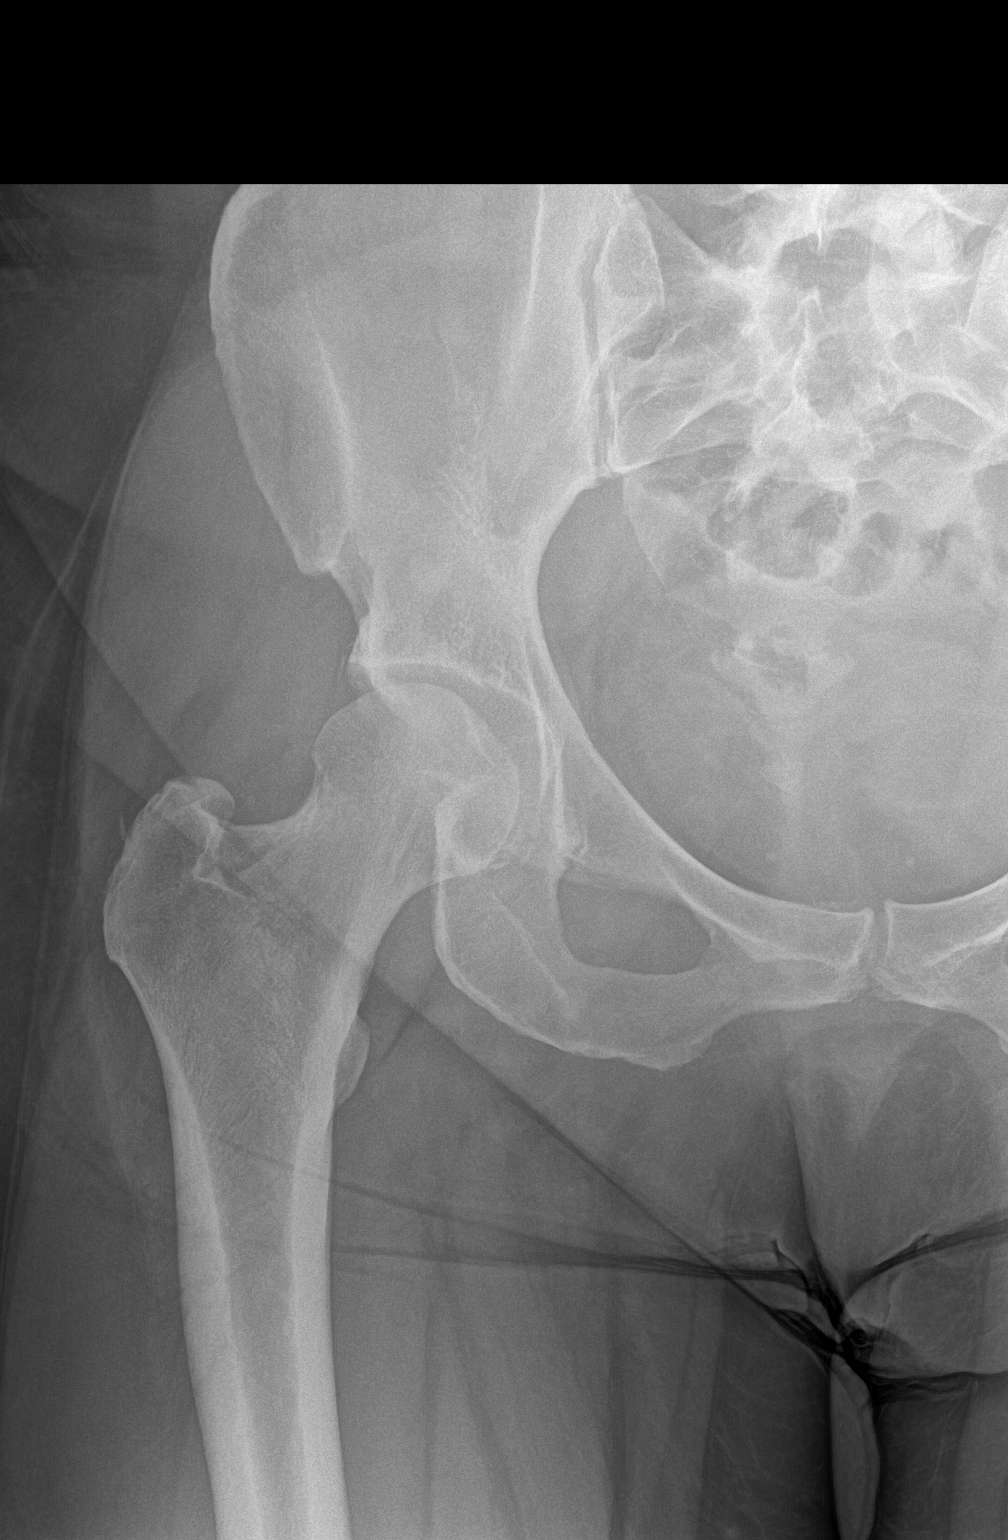

[hip frog leg]
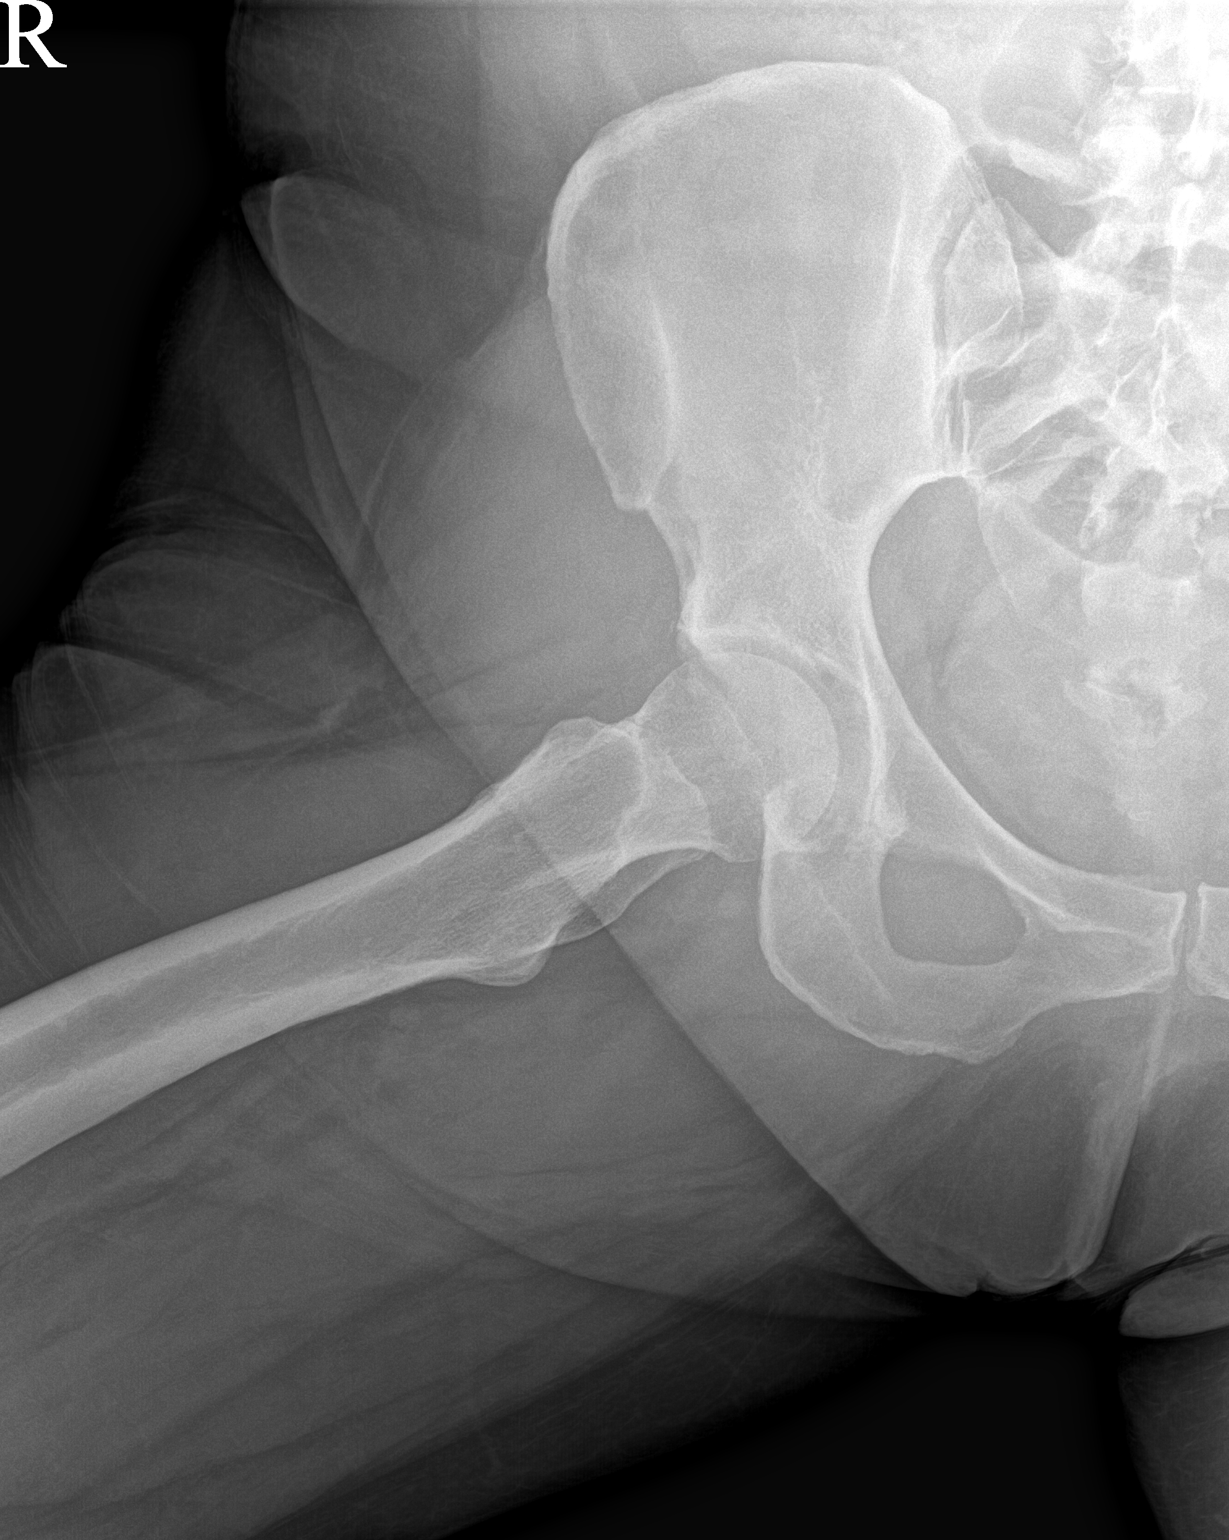

[3 of 3 positions shown; findings below may reference images not displayed]

FINDINGS: There is no evidence of hip fracture or dislocation. There is no
evidence of arthropathy or other focal bone abnormality.
IMPRESSION: Negative.

## 2021-09-06 NOTE — Patient Instructions (Addendum)
Good to see you today.  You received an injection today. Seek immediate medical attention if the joint becomes red, extremely painful, or is oozing fluid.   Please complete the exercises that the athletic trainer went over with you:  View at [email or web address] using code: [email or web address]  Follow-up: as needed

## 2021-09-09 ENCOUNTER — Other Ambulatory Visit (HOSPITAL_COMMUNITY): Payer: Self-pay

## 2021-09-09 ENCOUNTER — Encounter: Payer: Self-pay | Admitting: Physician Assistant

## 2021-09-09 MED ORDER — TIRZEPATIDE 7.5 MG/0.5ML ~~LOC~~ SOAJ
7.5000 mg | SUBCUTANEOUS | 0 refills | Status: DC
  Filled 2021-09-09: qty 2, 28d supply, fill #0

## 2021-09-09 NOTE — Progress Notes (Signed)
Right hip x-ray looks normal to radiology

## 2021-09-12 ENCOUNTER — Other Ambulatory Visit (HOSPITAL_COMMUNITY): Payer: Self-pay

## 2021-09-12 DIAGNOSIS — F902 Attention-deficit hyperactivity disorder, combined type: Secondary | ICD-10-CM | POA: Diagnosis not present

## 2021-09-12 DIAGNOSIS — Z79899 Other long term (current) drug therapy: Secondary | ICD-10-CM | POA: Diagnosis not present

## 2021-09-12 MED ORDER — VYVANSE 60 MG PO CHEW
1.0000 | CHEWABLE_TABLET | Freq: Every day | ORAL | 0 refills | Status: DC
Start: 2021-09-12 — End: 2021-10-08
  Filled 2021-09-12: qty 30, 30d supply, fill #0

## 2021-09-12 MED ORDER — VYVANSE 60 MG PO CHEW
1.0000 | CHEWABLE_TABLET | Freq: Every day | ORAL | 0 refills | Status: DC
Start: 2021-11-11 — End: 2022-05-07
  Filled 2021-12-06: qty 30, 30d supply, fill #0

## 2021-09-12 MED ORDER — VYVANSE 60 MG PO CHEW
1.0000 | CHEWABLE_TABLET | Freq: Every day | ORAL | 0 refills | Status: DC
Start: 2021-10-12 — End: 2022-05-07
  Filled 2021-11-07: qty 30, 30d supply, fill #0

## 2021-09-29 ENCOUNTER — Other Ambulatory Visit: Payer: Self-pay

## 2021-10-02 ENCOUNTER — Other Ambulatory Visit (HOSPITAL_COMMUNITY): Payer: Self-pay

## 2021-10-02 ENCOUNTER — Other Ambulatory Visit: Payer: Self-pay

## 2021-10-04 ENCOUNTER — Other Ambulatory Visit (HOSPITAL_COMMUNITY): Payer: Self-pay

## 2021-10-04 ENCOUNTER — Other Ambulatory Visit: Payer: Self-pay

## 2021-10-07 ENCOUNTER — Other Ambulatory Visit (HOSPITAL_COMMUNITY): Payer: Self-pay

## 2021-10-08 ENCOUNTER — Other Ambulatory Visit (HOSPITAL_COMMUNITY): Payer: Self-pay

## 2021-10-08 MED ORDER — VYVANSE 60 MG PO CHEW
1.0000 | CHEWABLE_TABLET | Freq: Every day | ORAL | 0 refills | Status: DC
  Filled 2021-10-10: qty 30, 30d supply, fill #0

## 2021-10-09 ENCOUNTER — Other Ambulatory Visit (HOSPITAL_COMMUNITY): Payer: Self-pay

## 2021-10-09 ENCOUNTER — Other Ambulatory Visit: Payer: Self-pay

## 2021-10-10 ENCOUNTER — Other Ambulatory Visit: Payer: Self-pay | Admitting: Physician Assistant

## 2021-10-10 ENCOUNTER — Other Ambulatory Visit (HOSPITAL_COMMUNITY): Payer: Self-pay

## 2021-10-10 MED ORDER — TIRZEPATIDE 10 MG/0.5ML ~~LOC~~ SOAJ
10.0000 mg | SUBCUTANEOUS | 0 refills | Status: DC
  Filled 2021-10-10: qty 2, 28d supply, fill #0

## 2021-10-11 ENCOUNTER — Other Ambulatory Visit (HOSPITAL_COMMUNITY): Payer: Self-pay

## 2021-10-14 ENCOUNTER — Other Ambulatory Visit: Payer: Self-pay

## 2021-10-14 ENCOUNTER — Other Ambulatory Visit (HOSPITAL_COMMUNITY): Payer: Self-pay

## 2021-10-15 ENCOUNTER — Other Ambulatory Visit (HOSPITAL_COMMUNITY): Payer: Self-pay

## 2021-10-15 MED ORDER — SLYND 4 MG PO TABS
4.0000 mg | ORAL_TABLET | Freq: Every day | ORAL | 0 refills | Status: DC
  Filled 2021-10-15: qty 28, 28d supply, fill #0

## 2021-10-30 ENCOUNTER — Other Ambulatory Visit: Payer: Self-pay | Admitting: Physician Assistant

## 2021-10-30 ENCOUNTER — Other Ambulatory Visit (HOSPITAL_COMMUNITY): Payer: Self-pay

## 2021-10-30 DIAGNOSIS — Z3041 Encounter for surveillance of contraceptive pills: Secondary | ICD-10-CM | POA: Diagnosis not present

## 2021-10-30 DIAGNOSIS — Z01419 Encounter for gynecological examination (general) (routine) without abnormal findings: Secondary | ICD-10-CM | POA: Diagnosis not present

## 2021-10-30 MED ORDER — SLYND 4 MG PO TABS
1.0000 | ORAL_TABLET | Freq: Every day | ORAL | 4 refills | Status: DC
  Filled 2021-10-30 – 2021-11-04 (×2): qty 84, 84d supply, fill #0
  Filled 2021-11-25: qty 28, 28d supply, fill #0
  Filled 2021-11-26 – 2021-11-27 (×2): qty 84, 84d supply, fill #0
  Filled 2022-02-06: qty 84, 84d supply, fill #1
  Filled 2022-05-14: qty 84, 84d supply, fill #2
  Filled 2022-07-17 – 2022-07-22 (×2): qty 84, 84d supply, fill #3
  Filled 2022-10-20: qty 84, 84d supply, fill #4

## 2021-10-30 MED ORDER — VALSARTAN-HYDROCHLOROTHIAZIDE 80-12.5 MG PO TABS
1.0000 | ORAL_TABLET | Freq: Every day | ORAL | 0 refills | Status: DC
  Filled 2021-10-30: qty 90, 90d supply, fill #0

## 2021-11-04 ENCOUNTER — Other Ambulatory Visit: Payer: Self-pay | Admitting: Physician Assistant

## 2021-11-04 ENCOUNTER — Other Ambulatory Visit (HOSPITAL_COMMUNITY): Payer: Self-pay

## 2021-11-05 ENCOUNTER — Other Ambulatory Visit (HOSPITAL_COMMUNITY): Payer: Self-pay

## 2021-11-05 MED ORDER — MOUNJARO 10 MG/0.5ML ~~LOC~~ SOAJ
10.0000 mg | SUBCUTANEOUS | 0 refills | Status: DC
  Filled 2021-11-05: qty 2, 28d supply, fill #0

## 2021-11-06 ENCOUNTER — Other Ambulatory Visit (HOSPITAL_COMMUNITY): Payer: Self-pay

## 2021-11-07 ENCOUNTER — Other Ambulatory Visit (HOSPITAL_COMMUNITY): Payer: Self-pay

## 2021-11-11 ENCOUNTER — Other Ambulatory Visit (HOSPITAL_COMMUNITY): Payer: Self-pay

## 2021-11-12 ENCOUNTER — Other Ambulatory Visit (HOSPITAL_COMMUNITY): Payer: Self-pay

## 2021-11-25 ENCOUNTER — Other Ambulatory Visit (HOSPITAL_COMMUNITY): Payer: Self-pay

## 2021-11-26 ENCOUNTER — Other Ambulatory Visit (HOSPITAL_COMMUNITY): Payer: Self-pay

## 2021-11-27 ENCOUNTER — Other Ambulatory Visit (HOSPITAL_COMMUNITY): Payer: Self-pay

## 2021-12-06 ENCOUNTER — Other Ambulatory Visit (HOSPITAL_COMMUNITY): Payer: Self-pay

## 2021-12-10 ENCOUNTER — Other Ambulatory Visit (HOSPITAL_COMMUNITY): Payer: Self-pay

## 2021-12-10 DIAGNOSIS — Z79899 Other long term (current) drug therapy: Secondary | ICD-10-CM | POA: Diagnosis not present

## 2021-12-10 DIAGNOSIS — F902 Attention-deficit hyperactivity disorder, combined type: Secondary | ICD-10-CM | POA: Diagnosis not present

## 2021-12-10 MED ORDER — VYVANSE 60 MG PO CHEW
1.0000 | CHEWABLE_TABLET | Freq: Every day | ORAL | 0 refills | Status: DC
  Filled 2022-02-07: qty 30, 30d supply, fill #0

## 2021-12-10 MED ORDER — VYVANSE 60 MG PO CHEW
1.0000 | CHEWABLE_TABLET | Freq: Every day | ORAL | 0 refills | Status: DC
  Filled 2022-02-07 – 2022-03-11 (×2): qty 30, 30d supply, fill #0

## 2021-12-10 MED ORDER — VYVANSE 60 MG PO CHEW
1.0000 | CHEWABLE_TABLET | Freq: Every day | ORAL | 0 refills | Status: DC
Start: 2021-12-10 — End: 2022-05-07
  Filled 2021-12-10 – 2022-01-07 (×2): qty 30, 30d supply, fill #0

## 2021-12-13 ENCOUNTER — Other Ambulatory Visit: Payer: Self-pay | Admitting: Physician Assistant

## 2021-12-13 ENCOUNTER — Other Ambulatory Visit (HOSPITAL_COMMUNITY): Payer: Self-pay

## 2021-12-16 ENCOUNTER — Other Ambulatory Visit (HOSPITAL_COMMUNITY): Payer: Self-pay

## 2021-12-16 ENCOUNTER — Other Ambulatory Visit: Payer: Self-pay | Admitting: *Deleted

## 2021-12-16 ENCOUNTER — Encounter: Payer: Self-pay | Admitting: Physician Assistant

## 2021-12-16 MED ORDER — MOUNJARO 10 MG/0.5ML ~~LOC~~ SOAJ
10.0000 mg | SUBCUTANEOUS | 0 refills | Status: DC
  Filled 2021-12-16: qty 2, 28d supply, fill #0

## 2021-12-19 ENCOUNTER — Ambulatory Visit: Payer: 59 | Admitting: Physician Assistant

## 2021-12-24 ENCOUNTER — Encounter: Payer: Self-pay | Admitting: Family Medicine

## 2021-12-24 ENCOUNTER — Ambulatory Visit (INDEPENDENT_AMBULATORY_CARE_PROVIDER_SITE_OTHER): Payer: 59 | Admitting: Physician Assistant

## 2021-12-24 ENCOUNTER — Encounter: Payer: Self-pay | Admitting: Physician Assistant

## 2021-12-24 VITALS — BP 110/70 | HR 102 | Temp 97.7°F | Ht 66.0 in | Wt 208.5 lb

## 2021-12-24 DIAGNOSIS — M5010 Cervical disc disorder with radiculopathy, unspecified cervical region: Secondary | ICD-10-CM | POA: Diagnosis not present

## 2021-12-24 DIAGNOSIS — E669 Obesity, unspecified: Secondary | ICD-10-CM | POA: Diagnosis not present

## 2021-12-24 DIAGNOSIS — E8881 Metabolic syndrome: Secondary | ICD-10-CM

## 2021-12-24 NOTE — Progress Notes (Signed)
Crystal Madden is a 46 y.o. female here for a follow up of a pre-existing problem. ? ?History of Present Illness:  ? ?Chief Complaint  ?Patient presents with  ? Obesity  ? ? ?HPI ? ?Obesity/Insulin Resistance ?Patient here for 4 month follow up. She is currently compliant with Mounjaro 10 mg weekly injection with no adverse effects. States she has had some nausea but this seems to be manageable. Despite this she has found the medication beneficial and is interested to continue the medication. She has been trying to follow healthy diet and exercise. Denies diarrhea, vomiting or abdominal pain. ? ?Cervical disc disorder  ?Patient has noticed some ongoing numbness in bilateral hands. States she had MRI about 2 years which showed radiculopathy. She is also experiencing back and neck pain. She notes after waking up from sleep sometimes she is unable to feel her hands due to numbness. She would like to be schedule for another MRI.  Denies worsening or new sx. ? ?Past Medical History:  ?Diagnosis Date  ? Anxiety   ? Depression   ? Diabetes mellitus without complication ([place])   ? High blood pressure   ? Hyperlipidemia   ? Lower back pain   ? Obesity (BMI 30-39.9)   ? ?  ?Social History  ? ?Tobacco Use  ? Smoking status: Former  ?  Types: Cigarettes  ?  Quit date: 08/27/2004  ?  Years since quitting: 17.3  ? Smokeless tobacco: Never  ?Vaping Use  ? Vaping Use: Never used  ?Substance Use Topics  ? Alcohol use: Yes  ?  Comment: Ocassional.  ? Drug use: No  ? ? ?Past Surgical History:  ?Procedure Laterality Date  ? BLADDER SUSPENSION    ? CESAREAN SECTION    ? LUMBAR MICRODISCECTOMY  2018  ? TONSILLECTOMY  2000  ? ? ?Family History  ?Problem Relation Age of Onset  ? Hypertension Mother   ? Hyperlipidemia Mother   ? Obesity Mother   ? COPD Father   ? Cancer Father   ? Heart disease Father   ? Hypertension Father   ? Stroke Father   ? Cancer Maternal Grandfather   ? Cancer Paternal Grandmother   ? Colon cancer Neg Hx    ? ? ?Allergies  ?Allergen Reactions  ? Amlodipine Swelling  ?  Lower leg swelling when increased to 10 mg  ? ? ?Current Medications:  ? ?Current Outpatient Medications:  ?  ALPRAZolam (XANAX) 0.5 MG tablet, TAKE 1 TABLET BY MOUTH IN THE AM AND 1/2 TABLET IN THE AFTERNOON (Patient taking differently: Take 0.5 mg by mouth daily as needed for anxiety.), Disp: 30 tablet, Rfl: 2 ?  bimatoprost (LATISSE) 0.03 % ophthalmic solution, Place 1 drop into both eyes See admin instructions. PLACE 1 DROP ONTO APPLICATOR & APPLY EVENLY ALONG SKIN OF UPPER LID AT BASE OF LASHES TO BOTH EYES AT BEDTIME, Disp: 3 mL, Rfl: 12 ?  Drospirenone (SLYND) 4 MG TABS, Take 1 tablet by mouth daily., Disp: 84 tablet, Rfl: 4 ?  Lisdexamfetamine Dimesylate (VYVANSE) 60 MG CHEW, Chew 1 tablet by mouth daily, Disp: 30 tablet, Rfl: 0 ?  Lisdexamfetamine Dimesylate (VYVANSE) 60 MG CHEW, Chew 1 tablet by mouth daily. DNF until 11/11/21, Disp: 30 tablet, Rfl: 0 ?  Lisdexamfetamine Dimesylate (VYVANSE) 60 MG CHEW, Chew 1 tablet by mouth daily., Disp: 30 tablet, Rfl: 0 ?  [START ON 01/08/2022] Lisdexamfetamine Dimesylate (VYVANSE) 60 MG CHEW, Chew 1 tablet by mouth daily. Fill 01/10/22, Disp: 30  tablet, Rfl: 0 ?  Lisdexamfetamine Dimesylate (VYVANSE) 60 MG CHEW, Chew 1 tablet by mouth daily., Disp: 30 tablet, Rfl: 0 ?  [START ON 02/07/2022] Lisdexamfetamine Dimesylate (VYVANSE) 60 MG CHEW, Chew 1 tablet by mouth daily. Fill 7/1, Disp: 30 tablet, Rfl: 0 ?  tirzepatide (MOUNJARO) 10 MG/0.5ML Pen, Inject 10 mg into the skin once a week., Disp: 2 mL, Rfl: 0 ?  valsartan-hydrochlorothiazide (DIOVAN-HCT) 80-12.5 MG tablet, Take 1 tablet by mouth daily., Disp: 90 tablet, Rfl: 0  ? ?Review of Systems:  ? ?ROS ?Negative unless otherwise specified per HPI.  ? ?Vitals:  ? ?Vitals:  ? 12/24/21 0821  ?BP: 110/70  ?Pulse: (!) 102  ?Temp: 97.7 ?F (36.5 ?C)  ?TempSrc: Temporal  ?SpO2: 98%  ?Weight: 208 lb 8 oz (94.6 kg)  ?Height: '5\' 6"'$  (1.676 m)  ?   ?Body mass index is 33.65  kg/m?. ? ?Physical Exam:  ? ?Physical Exam ?Vitals and nursing note reviewed.  ?Constitutional:   ?   General: She is not in acute distress. ?   Appearance: She is well-developed. She is not ill-appearing or toxic-appearing.  ?Cardiovascular:  ?   Rate and Rhythm: Normal rate and regular rhythm.  ?   Pulses: Normal pulses.  ?   Heart sounds: Normal heart sounds, S1 normal and S2 normal.  ?Pulmonary:  ?   Effort: Pulmonary effort is normal.  ?   Breath sounds: Normal breath sounds.  ?Skin: ?   General: Skin is warm and dry.  ?Neurological:  ?   Mental Status: She is alert.  ?   GCS: GCS eye subscore is 4. GCS verbal subscore is 5. GCS motor subscore is 6.  ?Psychiatric:     ?   Speech: Speech normal.     ?   Behavior: Behavior normal. Behavior is cooperative.  ? ? ?Assessment and Plan:  ? ?Obesity, unspecified classification, unspecified obesity type, unspecified whether serious comorbidity present; Insulin resistance ?Improving ?Continue Mounjaro 10 mg weekly, she will let us know when she is ready to increase dosage ?Follow-up in 3-6 months, sooner if concerns ? ?Cervical disc disorder with radiculopathy ?Recommend close follow-up with sports medicine, Dr. Georgina Snell, to further discuss updating MRI's ?No urgent/new red flags sx requiring evaluation today ? ?I,Savera Zaman,acting as a Education administrator for [employer], PA.,have documented all relevant documentation on the behalf of Inda Coke, PA,as directed by  Inda Coke, PA while in the presence of Inda Coke, [place].  ? ?IInda Coke, PA, have reviewed all documentation for this visit. The documentation on 12/24/21 for the exam, diagnosis, procedures, and orders are all accurate and complete. ? ?Inda Coke, PA-C ? ?

## 2021-12-24 NOTE — Patient Instructions (Signed)
Wt Readings from Last 3 Encounters:  ?12/24/21 208 lb 8 oz (94.6 kg)  ?09/06/21 224 lb 9.6 oz (101.9 kg)  ?09/04/21 222 lb (100.7 kg)  ? ? ?

## 2021-12-27 NOTE — Progress Notes (Signed)
I, Crystal [facility], LAT, ATC, am serving as scribe for Dr. Lynne Madden.  Crystal Madden is a 46 y.o. female who [facility] for B hand and feet paresthesias.  She was last seen by [facility] on 09/06/21 for [name] after suffering a fall down some stairs in Sept 2022.  Prior to that she was seen on 01/05/20 for f/u of LBP, neck and shoulder pain.  Today, pt reports that she has been having paresthesias in her B hands and feet.  She states that she notices her hand paresthesias mainly in the morning upon waking but will have R nerve-like pain in her R forearm and weakness in her R hand.  She also notes L foot paresthesias, particularly in her L 5th toe.  She is also having some increased low back pain, particularly when standing too long.  She is trying to exercise more for weight loss purposes and is interested in knowing what's going on Madden/ her low back to determine what she can/can't do regarding exercise.  Also notes her symptoms have worsened recently.   Diagnostic imaging: C-spine and L-spine MRI- 10/29/19; L hip and L-spine XR- 08/25/19;   Pertinent review of systems: No fevers or chills  Relevant historical information: Hypertension.  History of back surgery.   Exam:  BP 110/80 (BP Location: Right Arm, Patient Position: Sitting, Cuff Size: Normal)   Pulse (!) 102   Ht '5\' 6"'$  (1.676 m)   Wt 206 lb 12.8 oz (93.8 kg)   SpO2 99%   BMI 33.38 kg/m  General: Well Developed, well nourished, and in no acute distress.   MSK:  C-spine: Normal-appearing Nontender midline.  Normal cervical motion.  Positive right-sided Spurling's test. Upper extremity strength is intact. Reflexes are intact Mildly positive Tinel's at cubital tunnel.  L-spine: Normal lumbar motion.  Lower extremity strength is intact. Reflexes are intact. Positive slump test.    Lab and Radiology Results  X-ray images C-spine and L-spine obtained today personally and  independently interpreted.  C-spine: Mild diffuse DDD.  Loss of cervical lordosis.  L-spine: DDD L5-S1 significant DDD L2-L3 with question compression fracture at L2-3  Await formal radiology review  EXAM: MRI CERVICAL SPINE WITHOUT CONTRAST   TECHNIQUE: Multiplanar, multisequence MR imaging of the cervical spine was performed. No intravenous contrast was administered.   COMPARISON:  No pertinent prior studies available for comparison.   FINDINGS: Alignment: Straightening of the expected cervical lordosis. No significant spondylolisthesis   Vertebrae: Vertebral body height is maintained. No marrow edema or suspicious osseous lesion. Tiny benign appearing cystic lesion versus annular fissure along the posterosuperior aspect of the C5 vertebral body.   Cord: No spinal cord signal abnormality.   Posterior Fossa, vertebral arteries, paraspinal tissues: No abnormality identified within included portions of the posterior fossa. Flow voids preserved within the cervical vertebral arteries. Paraspinal soft tissues within normal limits.   Disc levels:   Mild-to-moderate disc degeneration throughout the cervical spine.   Congenitally narrow cervical spinal canal.   C2-C3: No disc herniation. No significant canal or foraminal stenosis.   C3-C4: Minimal disc bulge right-sided disc osteophyte ridge/uncinate hypertrophy contributes to moderate/severe right neural foraminal narrowing. Left-sided uncinate hypertrophy contributes to mild left neural foraminal narrowing. Mild degenerative spinal canal stenosis.   C4-C5: Minimal disc bulge. Bilateral disc osteophyte ridge/uncinate hypertrophy. Mild degenerative spinal canal narrowing. Mild bilateral neural foraminal narrowing (greater on the left).   C5-C6: No significant disc herniation  or spinal canal stenosis. Right greater than left uncinate hypertrophy. Mild right neural foraminal narrowing.   C6-C7: No disc herniation. No  significant canal or foraminal stenosis.   C7-T1: No disc herniation. No significant canal or foraminal stenosis.   IMPRESSION: Cervical spondylosis, as outlined and superimposed upon a congenitally narrow cervical spinal canal.   No more than mild degenerative spinal canal narrowing at any level.   Multilevel neural foraminal narrowing as described and greatest on the right at C3-C4 where right-sided disc osteophyte ridge contributes to moderate/severe foraminal stenosis. Correlate for right C4 radiculopathy.     Electronically Signed   By: Crystal Simmering DO   On: 10/29/2019 08:58   EXAM: MRI LUMBAR SPINE WITHOUT CONTRAST   TECHNIQUE: Multiplanar, multisequence MR imaging of the lumbar spine was performed. No intravenous contrast was administered.   COMPARISON:  Previous MRI from 11/21/2016   FINDINGS: Segmentation: Standard. Lowest well-formed disc space labeled the L5-S1 level.   Alignment: Mild straightening and reversal of the normal lumbar lordosis, stable. No listhesis or subluxation.   Vertebrae: Vertebral body height maintained without evidence for acute or chronic fracture. Bone marrow signal intensity mildly heterogeneous but within normal limits. Discogenic reactive endplate changes noted about the L2-3 interspace anteriorly. No other abnormal marrow edema.   Conus medullaris and cauda equina: Conus extends to the L1 level. Conus and cauda equina appear normal.   Paraspinal and other soft tissues: Chronic postoperative changes noted within the posterior paraspinous soft tissues. Paraspinous tissues demonstrate no acute finding. Visualized visceral structures within normal limits.   Disc levels:   T11-12: Unremarkable   T12-L1: Normal interspace. Mild facet hypertrophy. No stenosis or impingement.   L1-2: Chronic intervertebral disc space narrowing with diffuse disc bulge and disc desiccation. Mild reactive endplate changes with marginal endplate  osteophytic spurring. Superimposed mild facet hypertrophy. No significant canal or lateral recess stenosis. Foramina remain patent.   L2-3: Chronic intervertebral disc space narrowing with diffuse disc bulge and disc desiccation. Disc bulging slightly asymmetric left. Superimposed reactive endplate changes with marginal endplate osteophytic spurring. Resultant moderate canal with moderate left greater than right lateral recess stenosis. Moderate bilateral L2 foraminal narrowing, also greater on the left. Appearance is similar to previous.   L3-4: Mild diffuse disc bulge with disc desiccation. Superimposed mild reactive endplate changes. Postoperative changes from prior left hemi laminectomy and micro discectomy. Disc bulging slightly asymmetric to the left with superimposed left foraminal disc protrusion, contacting the exiting left L3 nerve (series 3, image 11). Superimposed mild facet hypertrophy. Trace bilateral joint effusions noted. Mild narrowing of the left lateral recess, similar to previous. Central canal remains patent. Mild to moderate bilateral L3 foraminal narrowing, greater on the left. Overall, appearance is similar to previous.   L4-5: Disc desiccation with mild disc bulge. Postoperative changes from prior left hemi laminectomy and micro discectomy. Small residual left subarticular disc protrusion with slight inferior angulation (series 2, image 9). Mild bilateral facet hypertrophy. Persistent moderate left lateral recess narrowing. Central canal remains patent. No significant foraminal stenosis.   L5-S1: Normal interspace. Mild bilateral facet hypertrophy. No stenosis or impingement.   IMPRESSION: 1. Multifactorial degenerative changes at L2-3 with resultant moderate canal with left greater than right lateral recess stenosis, similar to previous. 2. Degenerative disc bulge with superimposed left foraminal disc protrusion at L3-4, contacting the exiting left L3  nerve. 3. Postoperative changes from prior left hemi laminectomy and micro discectomy at L3-4 and L4-5 with small residual left subarticular disc protrusion  at L4-5. Residual mild to moderate left lateral recess narrowing at these levels is relatively unchanged.     Electronically Signed   By: Jeannine Boga M.D.   On: 10/29/2019 22:33    I, [facility], personally (independently) visualized and performed the interpretation of the images attached in this note.    Assessment and Plan: 46 y.o. female with  New right arm paresthesias in a C8 dermatomal pattern.  This does not correspond to changes seen on her cervical MRI March 2021.  Plan to proceed with cervical MRI to further evaluate etiology.  Additionally will obtain x-ray today and start home exercise program for this issue in clinic today by ATC and additionally will treat with cubital tunnel splint at night.  Check back after MRI or in 6 weeks.  New left leg radiculopathy at S1 dermatomal pattern.  Again this does not correspond to her MRI lumbar spine March 2021.  This is a new issue.  Again we will proceed with MRI updated today as well as x-ray.  Again physical therapy home exercise program taught in clinic today by ATC.  Check back after MRI or in 6 weeks.  Additionally we discussed some weight management strategies.  PDMP not reviewed this encounter. Orders Placed This Encounter  Procedures   DG Cervical Spine 2 or 3 views    Standing Status:   Future    Number of Occurrences:   1    Standing Expiration Date:   01/30/2022    Order Specific Question:   Reason for Exam (SYMPTOM  OR DIAGNOSIS REQUIRED)    Answer:   Neck pain    Order Specific Question:   Is patient pregnant?    Answer:   No    Order Specific Question:   Preferred imaging location?    Answer:   [facility]   DG Lumbar Spine 2-3 Views    Standing Status:   Future    Number of Occurrences:   1    Standing Expiration Date:   01/30/2022     Order Specific Question:   Reason for Exam (SYMPTOM  OR DIAGNOSIS REQUIRED)    Answer:   low back pain    Order Specific Question:   Is patient pregnant?    Answer:   No    Order Specific Question:   Preferred imaging location?    Answer:   [facility]   MR CERVICAL SPINE WO CONTRAST    Standing Status:   Future    Standing Expiration Date:   12/31/2022    Order Specific Question:   What is the patient's sedation requirement?    Answer:   No Sedation    Order Specific Question:   Does the patient have a pacemaker or implanted devices?    Answer:   No    Order Specific Question:   Preferred imaging location?    Answer:   [facility] (table limit-350lbs)   MR Lumbar Spine Wo Contrast    Standing Status:   Future    Standing Expiration Date:   12/31/2022    Order Specific Question:   What is the patient's sedation requirement?    Answer:   No Sedation    Order Specific Question:   Does the patient have a pacemaker or implanted devices?    Answer:   No    Order Specific Question:   Preferred imaging location?    Answer:   [facility] (table limit-350lbs)   No orders  of the defined types were placed in this encounter.    Discussed warning signs or symptoms. Please see discharge instructions. Patient expresses understanding.   The above documentation has been reviewed and is accurate and complete [facility], M.D.

## 2021-12-30 ENCOUNTER — Ambulatory Visit (INDEPENDENT_AMBULATORY_CARE_PROVIDER_SITE_OTHER): Payer: 59

## 2021-12-30 ENCOUNTER — Encounter: Payer: Self-pay | Admitting: Family Medicine

## 2021-12-30 ENCOUNTER — Ambulatory Visit (INDEPENDENT_AMBULATORY_CARE_PROVIDER_SITE_OTHER): Payer: 59 | Admitting: Family Medicine

## 2021-12-30 VITALS — BP 110/80 | HR 102 | Ht 66.0 in | Wt 206.8 lb

## 2021-12-30 DIAGNOSIS — M545 Low back pain, unspecified: Secondary | ICD-10-CM | POA: Diagnosis not present

## 2021-12-30 DIAGNOSIS — M5412 Radiculopathy, cervical region: Secondary | ICD-10-CM | POA: Diagnosis not present

## 2021-12-30 DIAGNOSIS — M542 Cervicalgia: Secondary | ICD-10-CM

## 2021-12-30 DIAGNOSIS — M5417 Radiculopathy, lumbosacral region: Secondary | ICD-10-CM

## 2021-12-30 DIAGNOSIS — Z6833 Body mass index (BMI) 33.0-33.9, adult: Secondary | ICD-10-CM

## 2021-12-30 DIAGNOSIS — G8929 Other chronic pain: Secondary | ICD-10-CM | POA: Diagnosis not present

## 2021-12-30 IMAGING — DX DG LUMBAR SPINE 2-3V
3 series · 3 of 3 positions shown · non-contrast
Comparison: Lumbar spine x-ray [DATE]

CLINICAL DATA: Low back pain

EXAM:
LUMBAR SPINE - 2-3 VIEW

[l-spine ap]
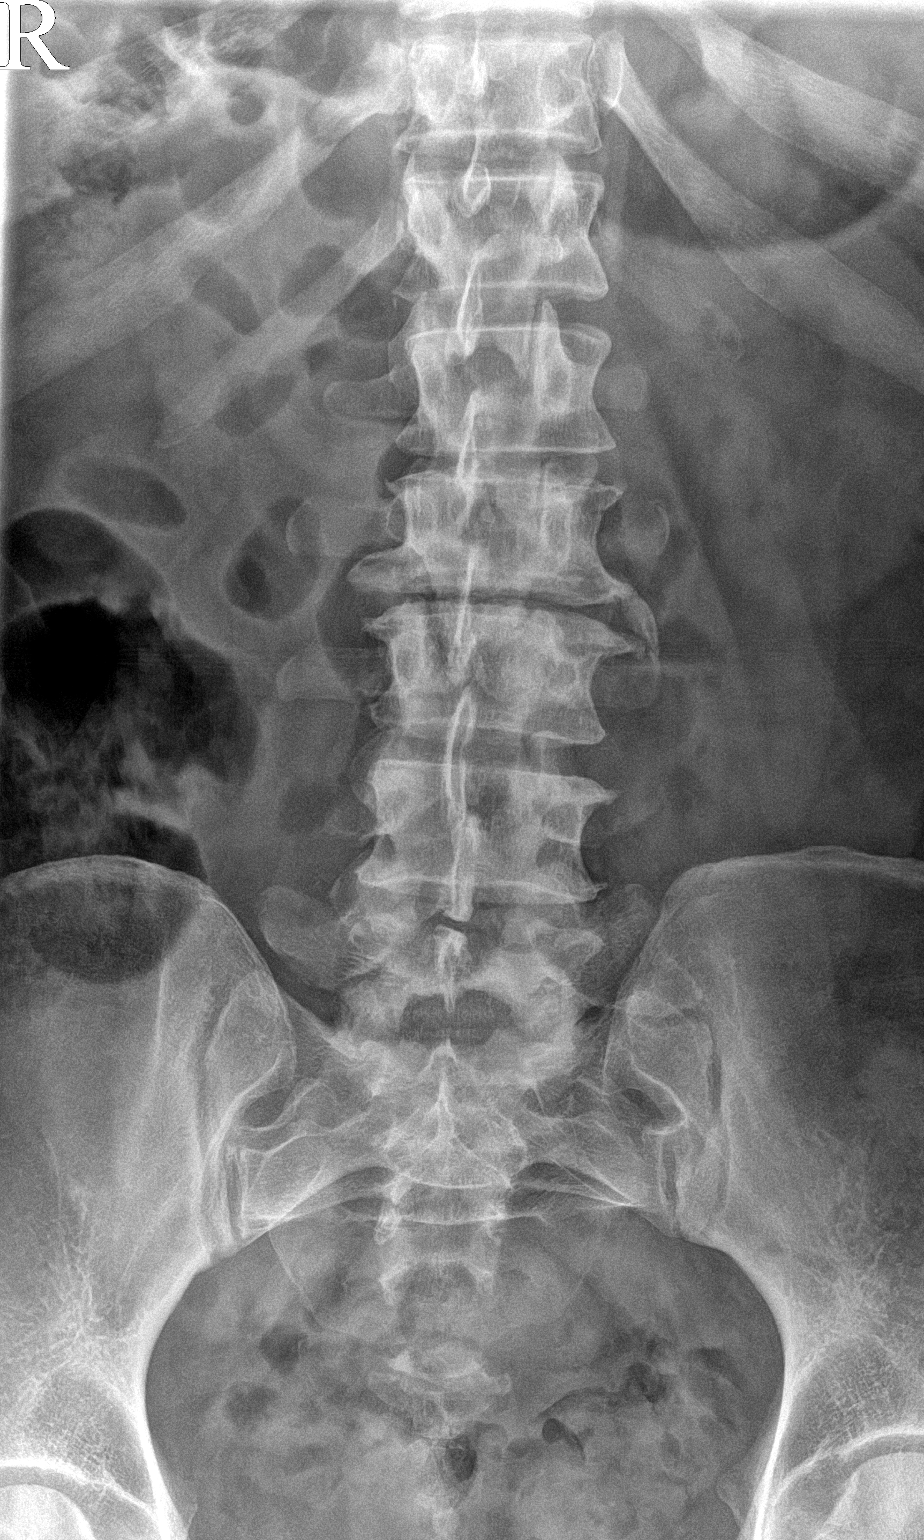

[l-spine lateral]
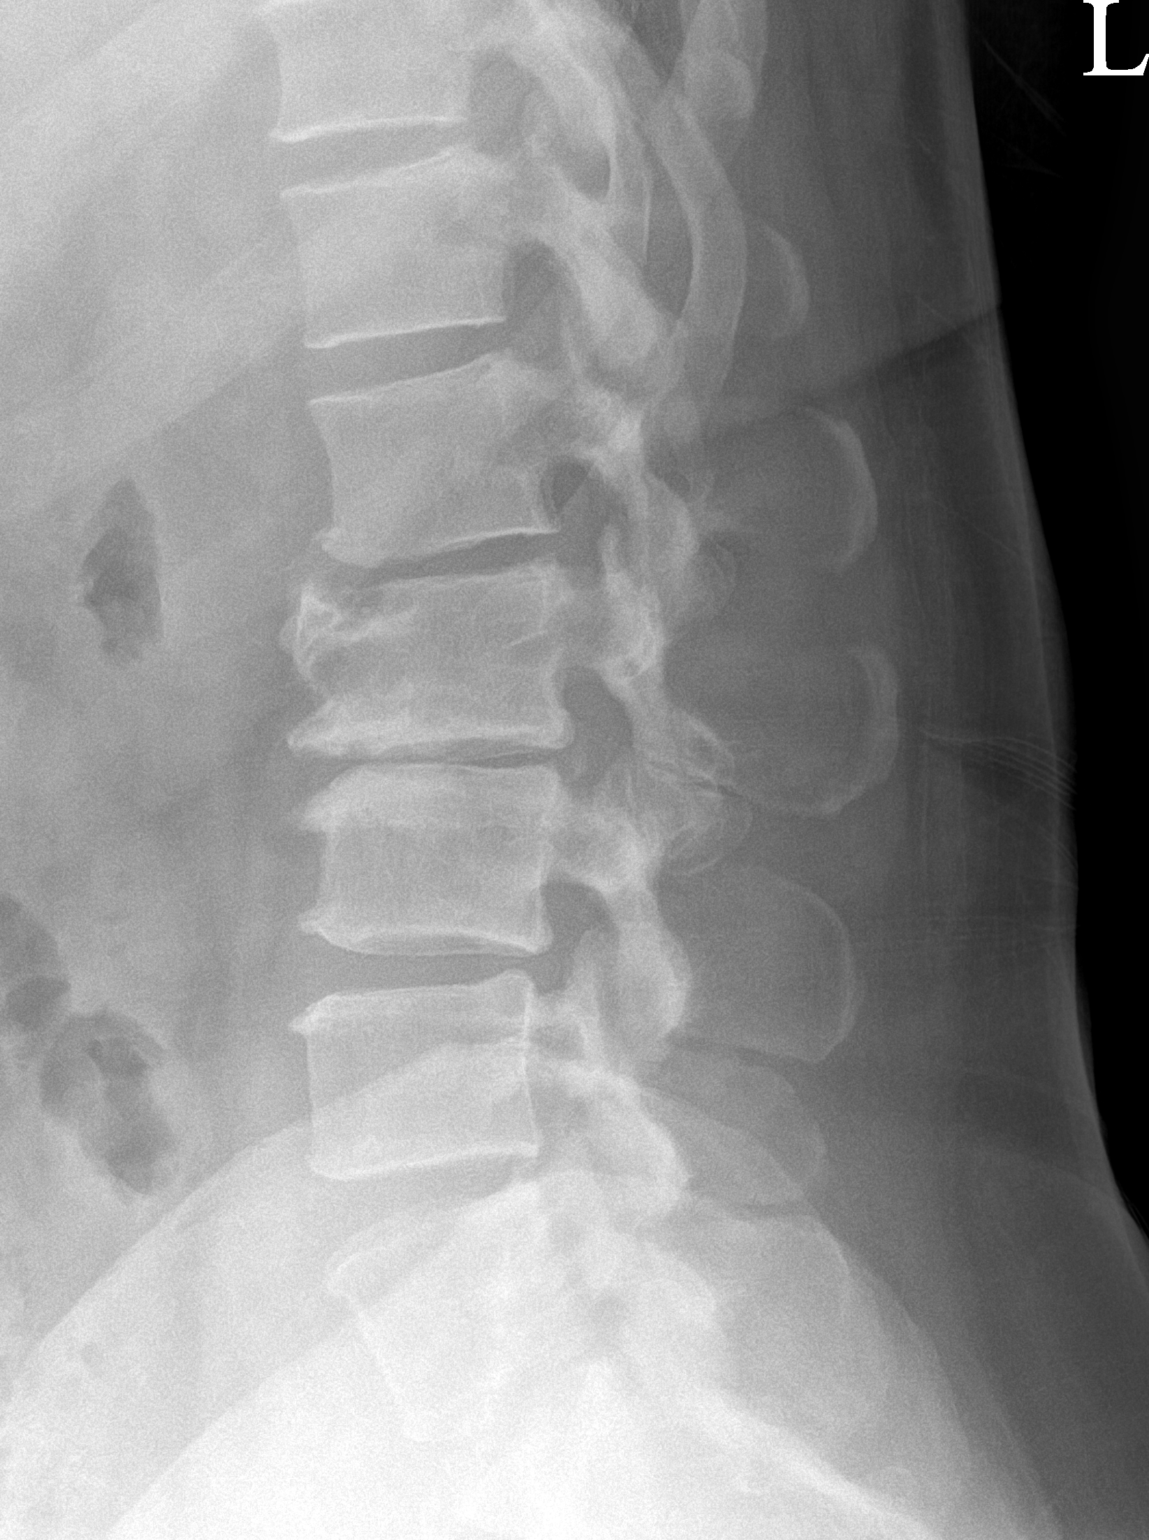

[l-spine spot]
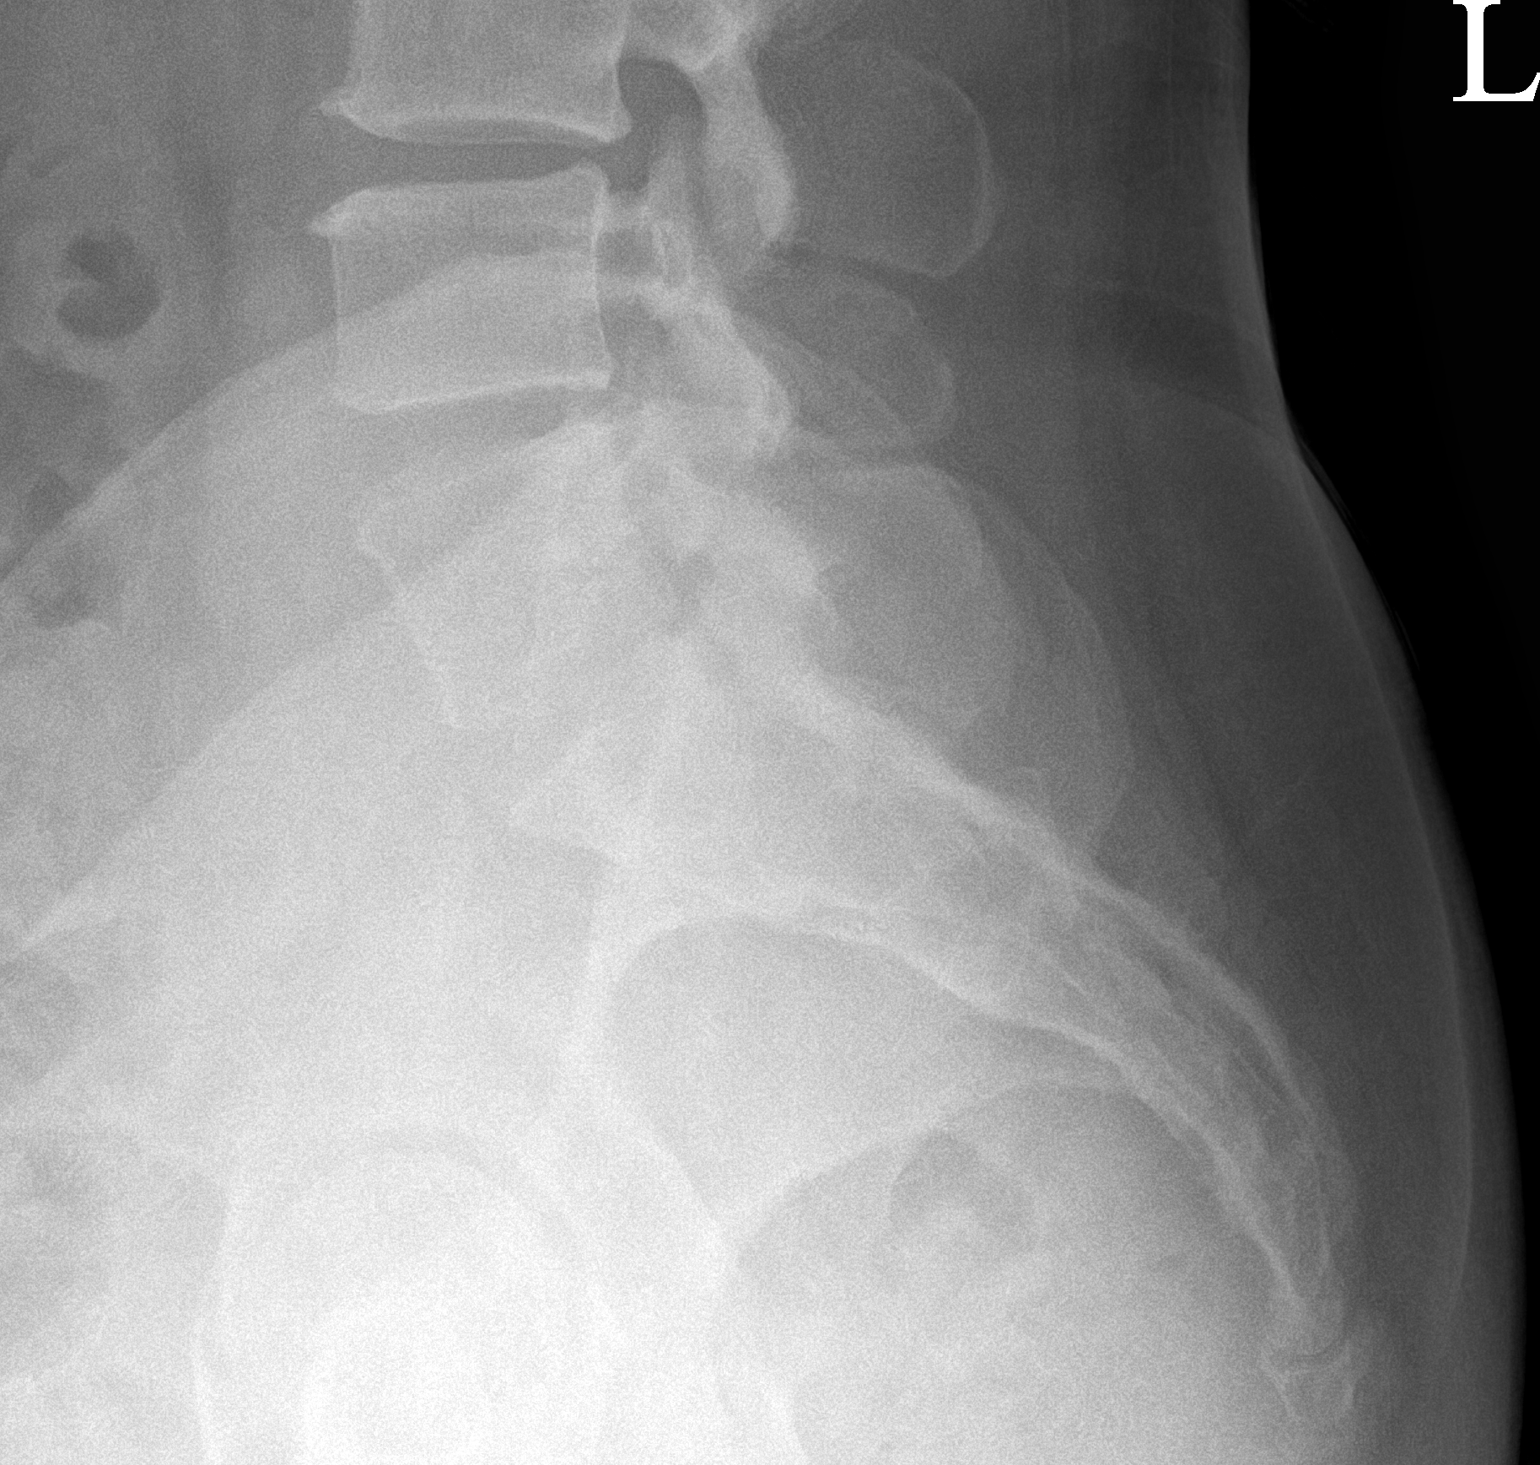

[3 of 3 positions shown; findings below may reference images not displayed]

FINDINGS: Lumbar vertebral body height and alignment are preserved without
evidence of fracture or spondylolisthesis. Moderate intervertebral
disc space narrowing at L2-L3 with associated endplate sclerosis.
Mild facet arthropathy.
IMPRESSION: Degenerative changes with no acute fracture identified.

## 2021-12-30 IMAGING — DX DG CERVICAL SPINE 2 OR 3 VIEWS
4 series · 4 of 4 positions shown · non-contrast
Comparison: None Available.

CLINICAL DATA: Chronic neck pain

EXAM:
CERVICAL SPINE - 2-3 VIEW

[c-spine lat]
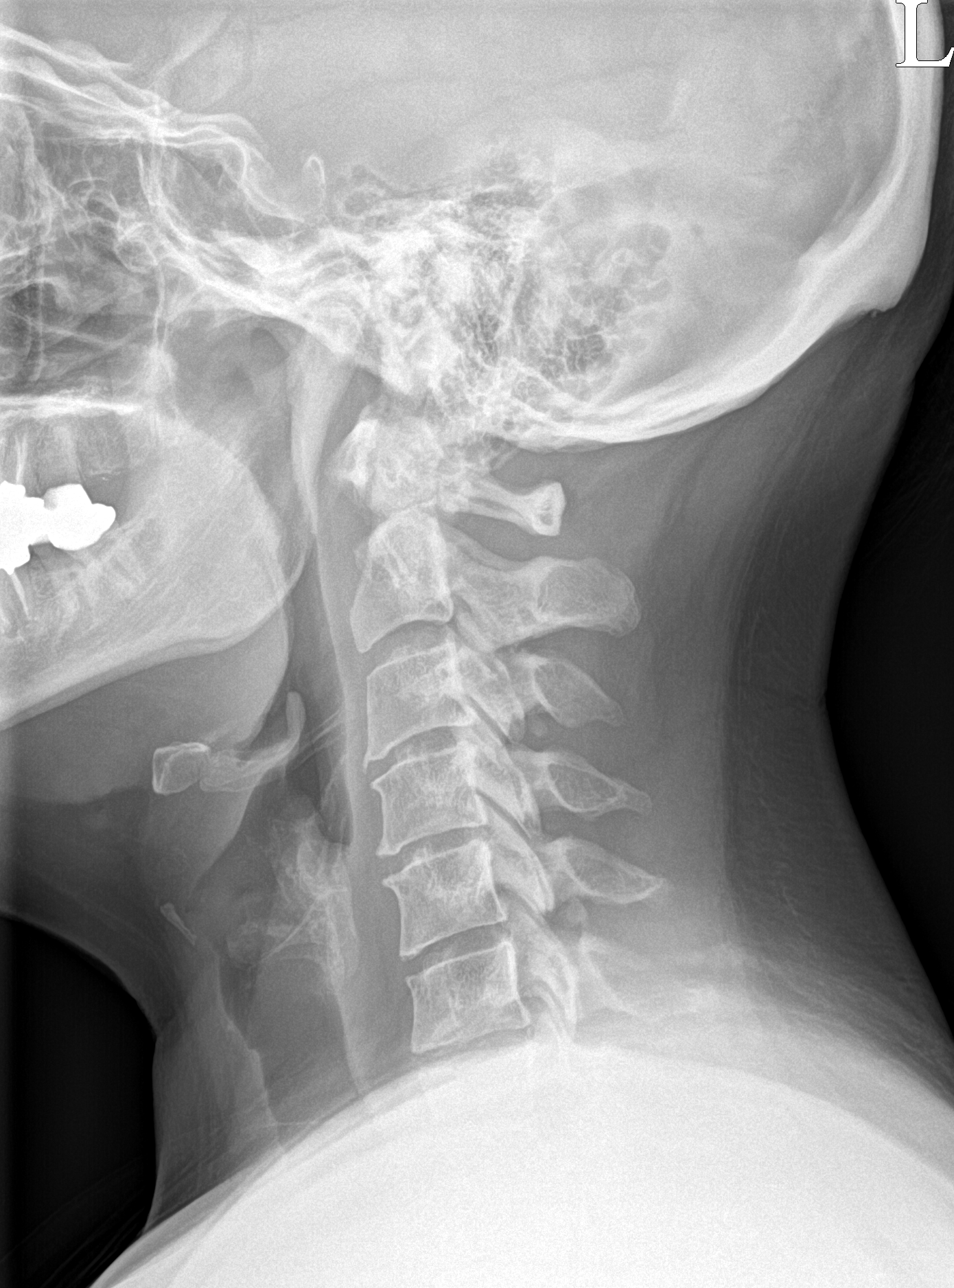

[c-spine ap]
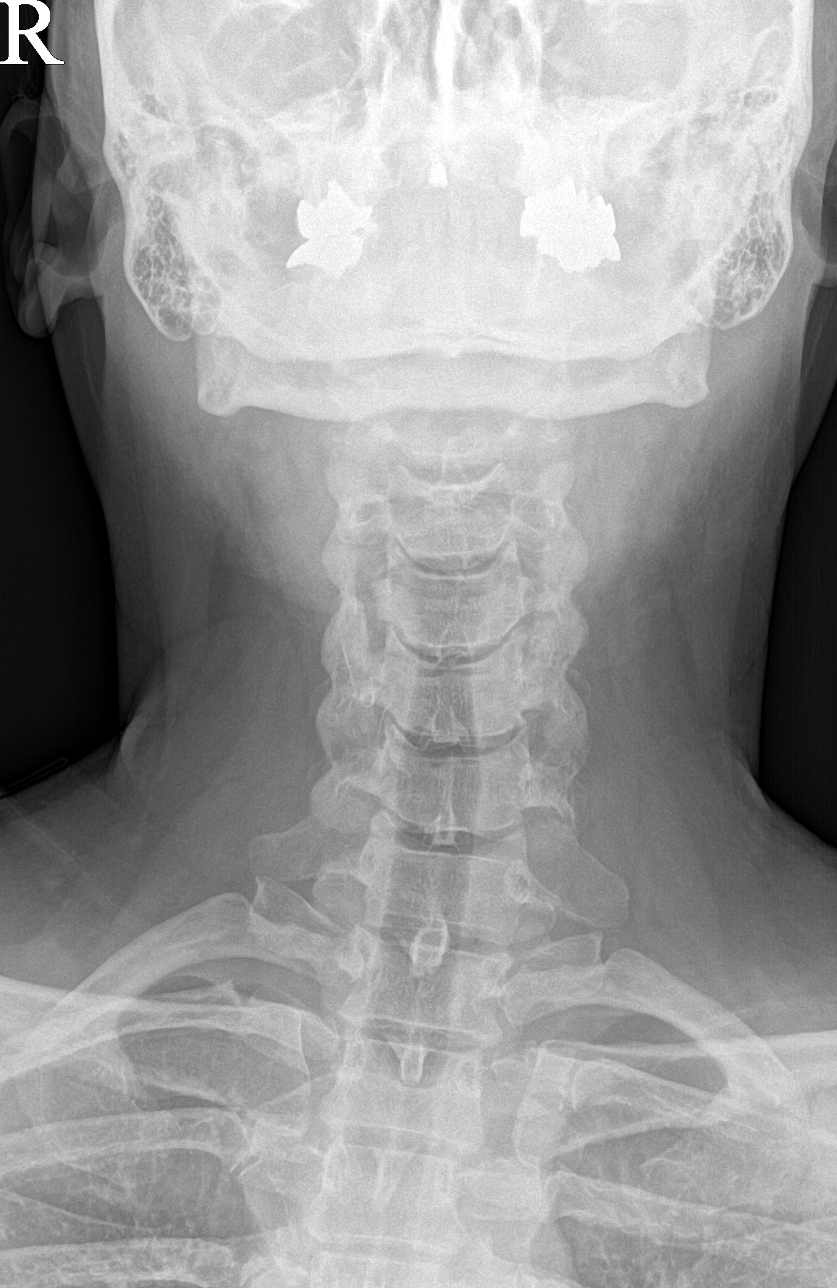

[c-spine open mouth]
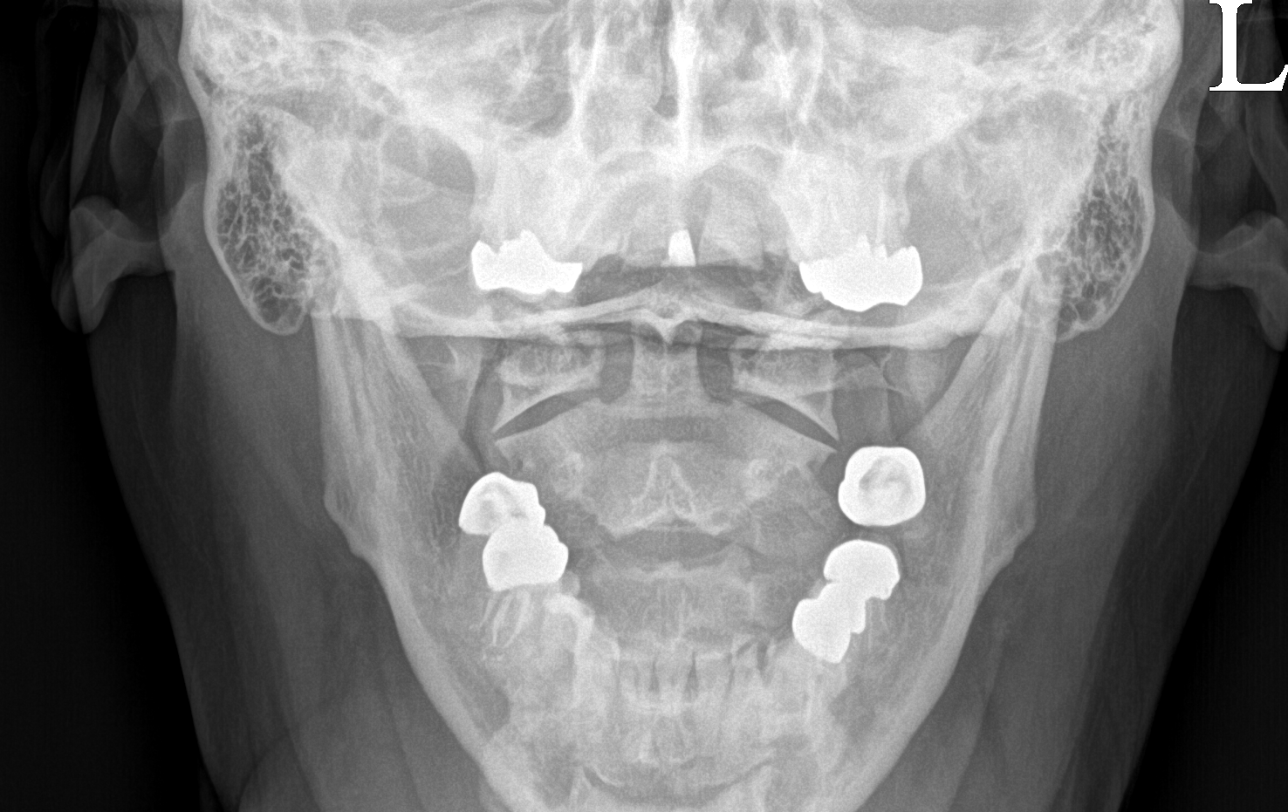

[ct-spine swimmers]
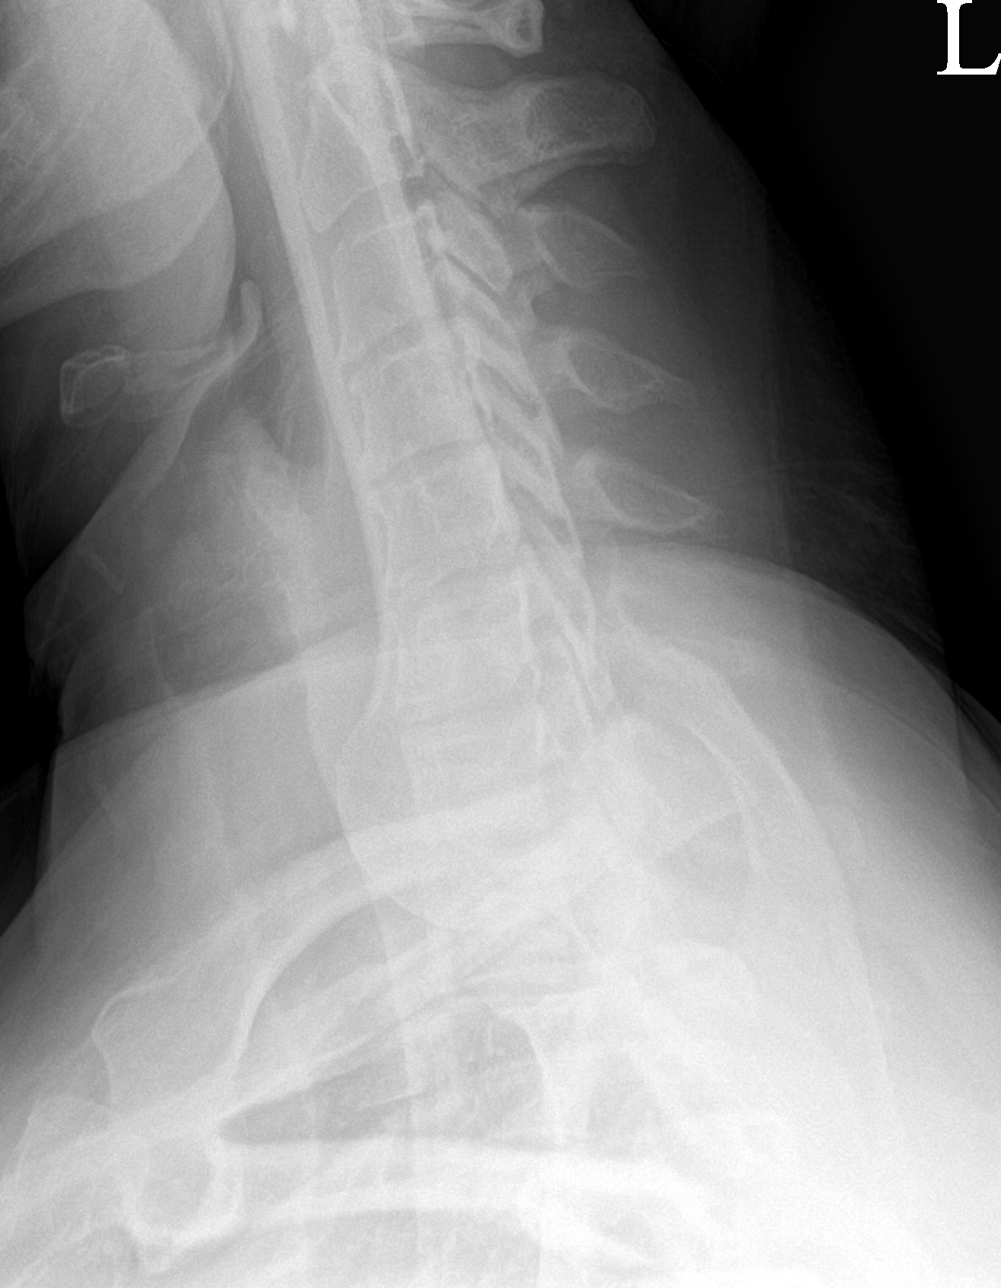

[4 of 4 positions shown; findings below may reference images not displayed]

FINDINGS: There is no evidence of cervical spine fracture or prevertebral soft
tissue swelling. Alignment is normal. Mild intervertebral disc space
narrowing. No other significant bone abnormalities are identified.
IMPRESSION: No acute fracture or malalignment identified.

## 2021-12-30 NOTE — Patient Instructions (Addendum)
Good to see you today.  Please get an Xray today before you leave.  Please perform the exercise program that we have prepared for you and gone over in detail on a daily basis.  In addition to the handout you were provided you can access your program through: [email or web address]   Your unique program code is:   [email or web address] and [email or web address]  Follow-up: 6 weeks or after MRI.   I would recommend a cubital tunnel night splint for the right elbow.   85-100 g of protein per day.

## 2021-12-31 NOTE — Progress Notes (Signed)
Cervical spine x-ray shows some arthritis changes.

## 2021-12-31 NOTE — Progress Notes (Signed)
Lumbar spine x-ray shows arthritis changes

## 2022-01-07 ENCOUNTER — Other Ambulatory Visit (HOSPITAL_COMMUNITY): Payer: Self-pay

## 2022-01-07 ENCOUNTER — Other Ambulatory Visit: Payer: Self-pay | Admitting: Physician Assistant

## 2022-01-07 MED ORDER — TIRZEPATIDE 12.5 MG/0.5ML ~~LOC~~ SOAJ
12.5000 mg | SUBCUTANEOUS | 0 refills | Status: DC
  Filled 2022-01-07 – 2022-02-06 (×2): qty 2, 28d supply, fill #0

## 2022-01-07 NOTE — Telephone Encounter (Signed)
Rx sent to pharmacy as requested.

## 2022-01-08 ENCOUNTER — Other Ambulatory Visit (HOSPITAL_COMMUNITY): Payer: Self-pay

## 2022-01-10 ENCOUNTER — Other Ambulatory Visit (HOSPITAL_COMMUNITY): Payer: Self-pay

## 2022-01-13 ENCOUNTER — Ambulatory Visit (INDEPENDENT_AMBULATORY_CARE_PROVIDER_SITE_OTHER): Payer: 59

## 2022-01-13 ENCOUNTER — Encounter: Payer: Self-pay | Admitting: Physician Assistant

## 2022-01-13 ENCOUNTER — Other Ambulatory Visit: Payer: Self-pay | Admitting: Physician Assistant

## 2022-01-13 ENCOUNTER — Other Ambulatory Visit (HOSPITAL_COMMUNITY): Payer: Self-pay

## 2022-01-13 DIAGNOSIS — M542 Cervicalgia: Secondary | ICD-10-CM

## 2022-01-13 DIAGNOSIS — G8929 Other chronic pain: Secondary | ICD-10-CM | POA: Diagnosis not present

## 2022-01-13 DIAGNOSIS — M5412 Radiculopathy, cervical region: Secondary | ICD-10-CM

## 2022-01-13 DIAGNOSIS — M545 Low back pain, unspecified: Secondary | ICD-10-CM

## 2022-01-13 IMAGING — MR MR LUMBAR SPINE W/O CM
4 of 5 series · 28 of 48 positions shown · non-contrast
Comparison: Prior MRI examination dated [DATE]

CLINICAL DATA: Low back pain.

EXAM:
MRI LUMBAR SPINE WITHOUT CONTRAST
TECHNIQUE: Multiplanar, multisequence MR imaging of the lumbar spine was
performed. No intravenous contrast was administered.

[Series 3: T2 · sagittal · 4.0mm · 0.81mm/px · 7 of 15 slices shown (1 of 2)]
[im 1/15]
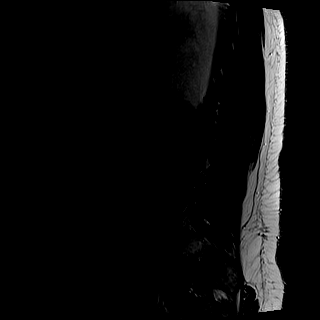
[im 3/15]
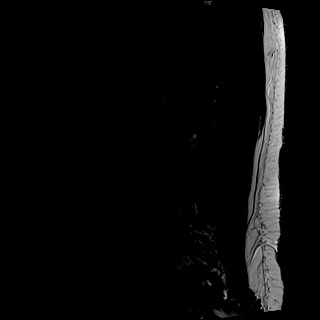
[im 5/15]
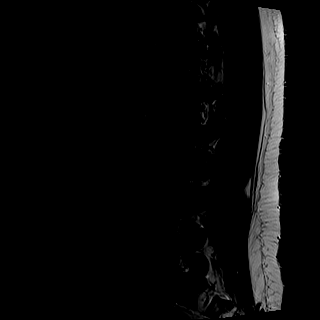
[im 8/15]
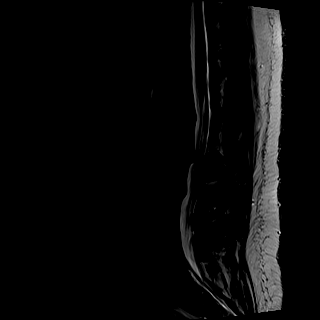
[im 10/15]
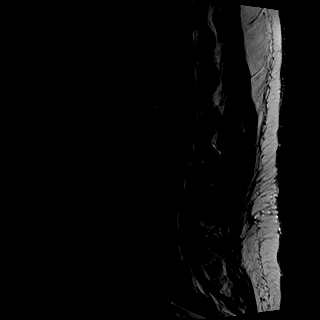
[im 12/15]
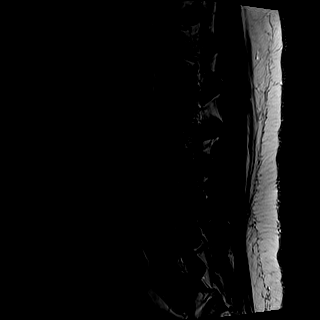
[im 15/15]
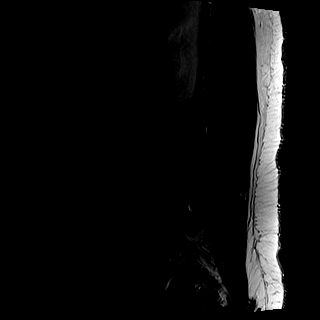

[Series 4: T1 · sagittal · 4.0mm · 0.51mm/px · 7 of 15 slices shown (1 of 2)]
[im 1/15]
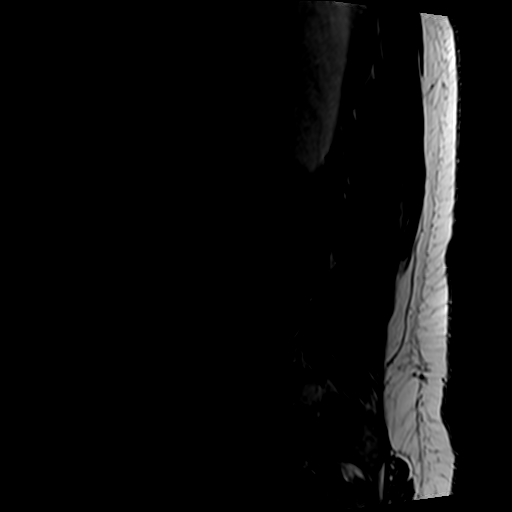
[im 3/15]
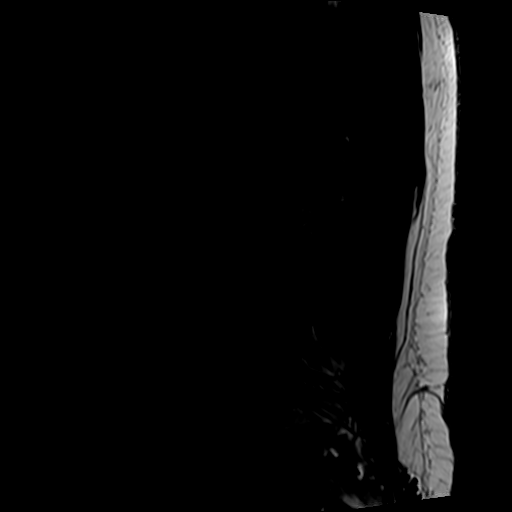
[im 5/15]
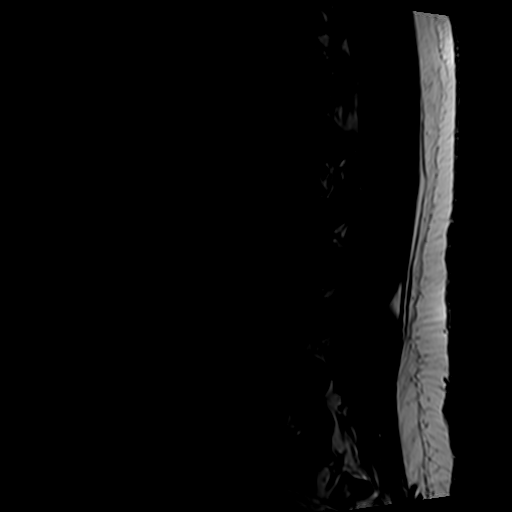
[im 8/15]
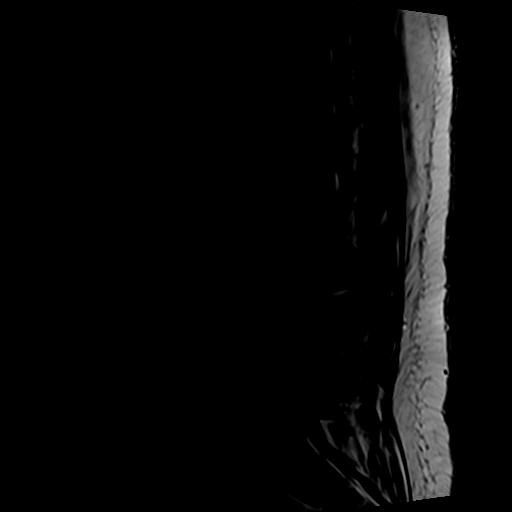
[im 10/15]
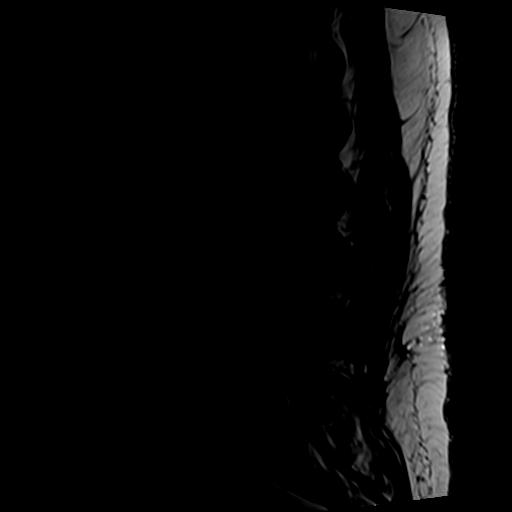
[im 12/15]
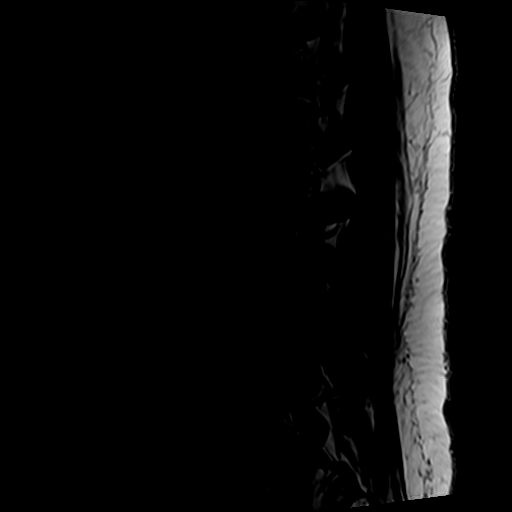
[im 15/15]
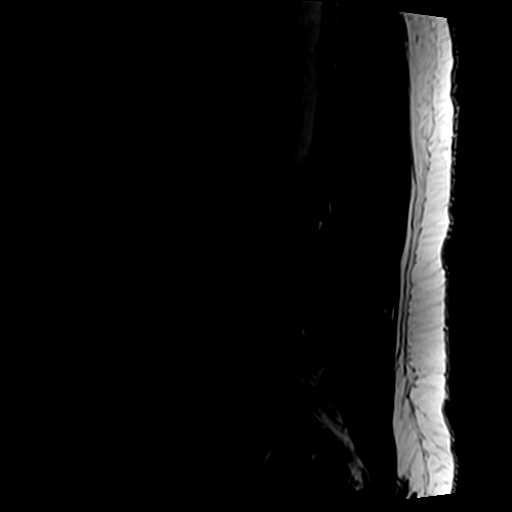

[Series 6: T2 · axial · 4.0mm · 0.78mm/px · z∈[-539,-352]mm · 8 of 34 slices shown (2 of 2)]
[im 1/34]
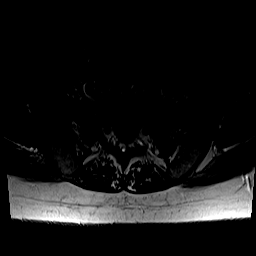
[im 6/34]
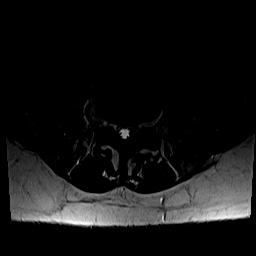
[im 11/34]
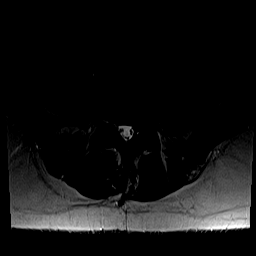
[im 16/34]
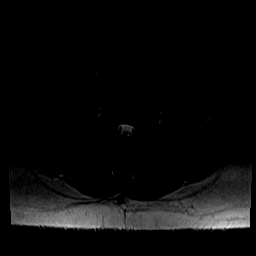
[im 18/34]
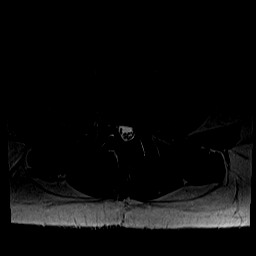
[im 23/34]
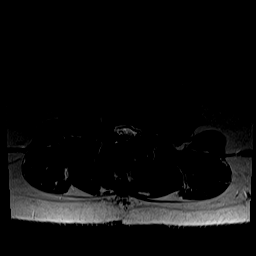
[im 28/34]
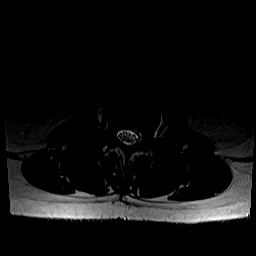
[im 34/34]
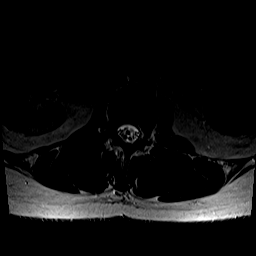

[Series 7: T1 · axial · 4.0mm · 0.39mm/px · z∈[-539,-382]mm · 6 of 34 slices shown (2 of 2)]
[im 1/34]
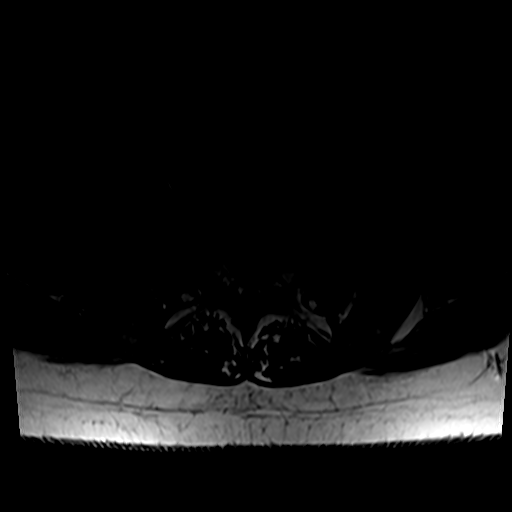
[im 6/34]
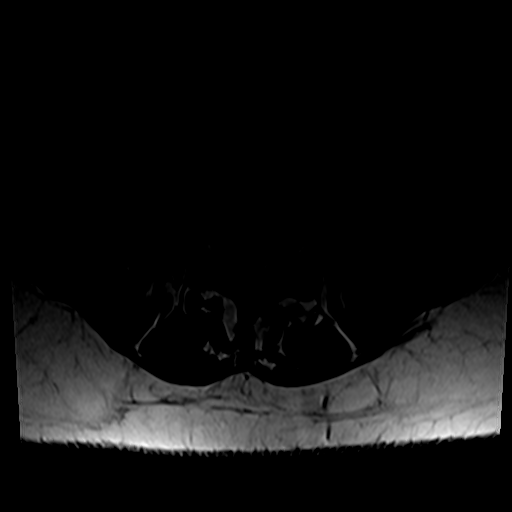
[im 11/34]
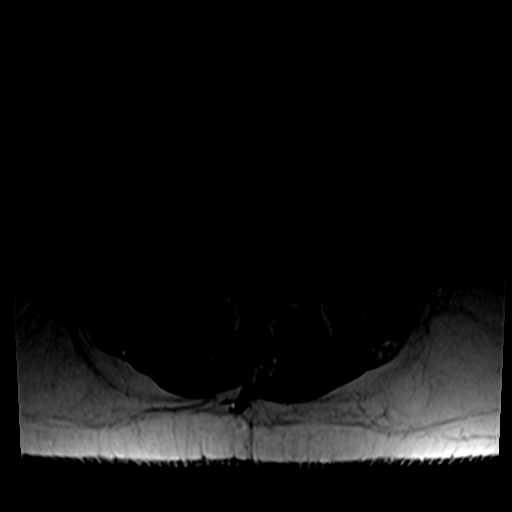
[im 16/34]
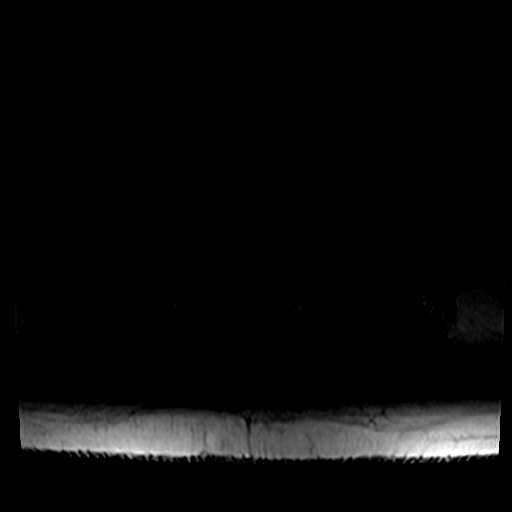
[im 18/34]
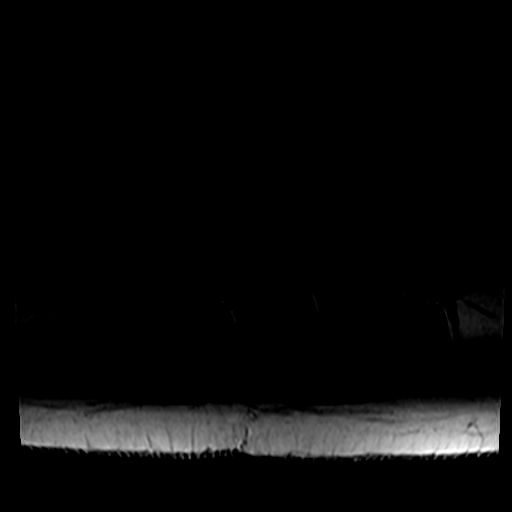
[im 28/34]
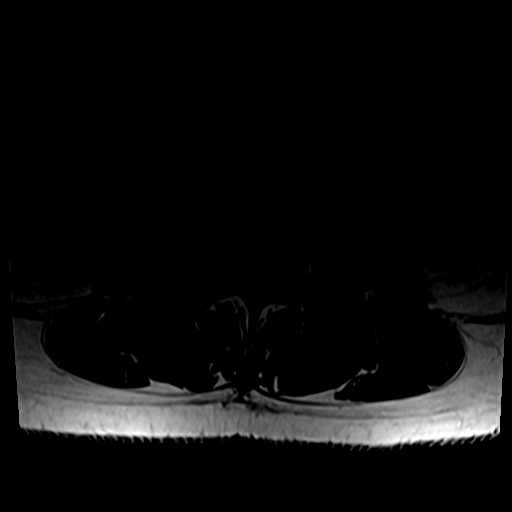

[28 of 48 positions shown; findings below may reference images not displayed]

FINDINGS: Segmentation:  Standard.

Alignment: Straightening of the lumbar spine. Mild reversal of the
lordosis at L2-L3.

Vertebrae: No fracture, evidence of discitis, or bone lesion.
Endplate sclerotic changes prominent at L2-L3.

Conus medullaris and cauda equina: Conus extends to the L1 level.
Conus and cauda equina appear normal.

Paraspinal and other soft tissues: No acute abnormality.

Disc levels:

T12-L1: No significant disc bulge. No neural foraminal stenosis. No
central canal stenosis.

L1-L2: Disc height loss and disc desiccation, unchanged. Mild facet
joint arthropathy. No significant spinal canal or neural foraminal
stenosis.

L2-L3: Disc height loss and prominent eccentric disc protrusion.
Marked narrowing of the left lateral recess with moderate L2 neural
foraminal stenosis. Mild right L2 neural foraminal stenosis.
Moderate facet joint arthropathy.

L3-L4: Eccentric disc protrusion to the left with marked narrowing
of the left lateral recess. Moderate facet joint arthropathy.
Moderate bilateral neural foraminal stenosis, left worse than the
right. Moderate facet joint arthropathy with joint effusion.

L4-L5: Disc desiccation and disc height loss. Postsurgical changes
from prior left hemilaminectomy. Asymmetric disc protrusion to the
left with marked stenosis of the left lateral recess. Mild left
neural foraminal stenosis. Moderate facet joint arthropathy.

L5-S1: No significant disc bulge. No neural foraminal stenosis. No
central canal stenosis. Moderate facet joint arthropathy.
IMPRESSION: 1.  No evidence of acute fracture or subluxation.

2. Progression of eccentric disc bulge on the left with marked
lateral recess stenosis bilaterally, left worse than the right with
mass effect on the descending nerve roots. Mild-to-moderate left
neural foraminal stenosis.

3. Disc desiccation and progression of disc bulge asymmetric to the
left with moderate left lateral recess stenosis. Mild left neural
foraminal stenosis.

## 2022-01-13 MED ORDER — MOUNJARO 10 MG/0.5ML ~~LOC~~ SOAJ
10.0000 mg | SUBCUTANEOUS | 0 refills | Status: DC
  Filled 2022-01-13: qty 2, 28d supply, fill #0

## 2022-01-14 ENCOUNTER — Encounter: Payer: Self-pay | Admitting: Family Medicine

## 2022-01-14 ENCOUNTER — Telehealth: Payer: Self-pay | Admitting: Family Medicine

## 2022-01-14 DIAGNOSIS — M5417 Radiculopathy, lumbosacral region: Secondary | ICD-10-CM

## 2022-01-14 NOTE — Telephone Encounter (Signed)
Epidural steroid injection ordered 

## 2022-01-14 NOTE — Progress Notes (Signed)
C-spine MRI does show bulging disc at C3-4 that could contact the nerve root at C4 and does look like it is pressing on the spinal cord itself.  Recommend go over the results in clinic with me in the near future.  Please schedule an appointment.

## 2022-01-14 NOTE — Progress Notes (Signed)
Lumbar spine MRI shows bulging disks pressing on potential nerves especially on her left L4-L5 that could cause the symptoms that you are experiencing.  I have already ordered an epidural steroid injection.  Please contact [facility] at [phone] to schedule.  Please follow-up with me in clinic to go over the results of the lumbar spine and cervical spine MRI.

## 2022-01-15 NOTE — Progress Notes (Signed)
I, Wendy Poet, LAT, ATC, am serving as scribe for Dr. Lynne Leader.  [facility] is a 46 y.o. female who presents to [facility] today for f/u of LBP and paresthesias in her hands/feet.  She was last seen by Dr. Georgina Snell on 12/30/21 and reported having paresthesias in her B hands and feet.  She stated that she notices her hand paresthesias mainly in the morning upon waking but will have R nerve-like pain in her R forearm and weakness in her R hand.  She also noted L foot paresthesias, particularly in her L 5th toe.  She was shown a HEP and referred for a c-spine and L-spine MRI.  She was also advised to purchase a cubital tunnel night splint for her R elbow.  Today, pt reports that her symptoms remain the same.  She did not purchase a cubital tunnel brace for her elbow.  Her arm symptoms are mostly located at dorsal forearm and arm weakness with occasional paresthesias dorsal hand.  Diagnostic testing: C-spine and L-spine MRI- 01/13/22; L-spine and C-spine XR- 12/30/21; C-spine and L-spine MRI- 10/19/19; L-spine XR- 08/25/19  Pertinent review of systems: No fevers or chills.  She does occasionally feel as though she has to remind herself to breathe and wonders if she has a C4 issue.  Relevant historical information: Hypertension.  ADHD.   Exam:  BP 102/70 (BP Location: Right Arm, Patient Position: Sitting, Cuff Size: Normal)   Pulse 92   Ht '5\' 6"'$  (1.676 m)   Wt 207 lb 3.2 oz (94 kg)   SpO2 98%   BMI 33.44 kg/m  General: Well Developed, well nourished, and in no acute distress.   MSK: Cervical spine: Normal.  Normal motion. Right arm: Normal. Tender palpation dorsal forearm.  Normal wrist and arm motion. L-spine: Nontender midline normal lumbar motion.    Lab and Radiology Results No results found for this or any previous visit (from the past 72 hour(s)). MR CERVICAL SPINE WO CONTRAST  Result Date: 01/14/2022 CLINICAL DATA:  Cervical  radiculopathy.  Neck stiffness and pain EXAM: MRI CERVICAL SPINE WITHOUT CONTRAST TECHNIQUE: Multiplanar, multisequence MR imaging of the cervical spine was performed. No intravenous contrast was administered. COMPARISON:  10/29/2019 FINDINGS: Alignment: Straightening of cervical lordosis, also seen previously Vertebrae: No fracture, evidence of discitis, or bone lesion. Cord: Normal signal Posterior Fossa, vertebral arteries, paraspinal tissues: Negative Disc levels: C2-3: Unremarkable. C3-4: Central disc protrusion which is new/progressed and indents the right ventral cord. Additional right foraminal protrusion which is chronic. C4-5: Disc bulging with shallow left foraminal protrusion. Upward pointing central disc protrusion. Mild to moderate left foraminal narrowing. C5-6: Foraminal predominant disc bulging C6-7: Mild disc bulging.  No neural compression C7-T1:Unremarkable. IMPRESSION: 1. C3-4 central disc protrusion indenting the right ventral cord, new/progressed from 2021. Chronic right foraminal protrusion at this level which could affect the right C4 nerve root. 2. Mild to moderate left foraminal narrowing at C4-5 Electronically Signed   By: Jorje Guild M.D.   On: 01/14/2022 12:54   MR Lumbar Spine Wo Contrast  Result Date: 01/14/2022 CLINICAL DATA:  Low back pain. EXAM: MRI LUMBAR SPINE WITHOUT CONTRAST TECHNIQUE: Multiplanar, multisequence MR imaging of the lumbar spine was performed. No intravenous contrast was administered. COMPARISON:  Prior MRI examination dated October 29, 2019 FINDINGS: Segmentation:  Standard. Alignment: Straightening of the lumbar spine. Mild reversal of the lordosis at L2-L3. Vertebrae: No fracture, evidence of discitis, or bone lesion. Endplate sclerotic changes prominent  at L2-L3. Conus medullaris and cauda equina: Conus extends to the L1 level. Conus and cauda equina appear normal. Paraspinal and other soft tissues: No acute abnormality. Disc levels: T12-L1: No  significant disc bulge. No neural foraminal stenosis. No central canal stenosis. L1-L2: Disc height loss and disc desiccation, unchanged. Mild facet joint arthropathy. No significant spinal canal or neural foraminal stenosis. L2-L3: Disc height loss and prominent eccentric disc protrusion. Marked narrowing of the left lateral recess with moderate L2 neural foraminal stenosis. Mild right L2 neural foraminal stenosis. Moderate facet joint arthropathy. L3-L4: Eccentric disc protrusion to the left with marked narrowing of the left lateral recess. Moderate facet joint arthropathy. Moderate bilateral neural foraminal stenosis, left worse than the right. Moderate facet joint arthropathy with joint effusion. L4-L5: Disc desiccation and disc height loss. Postsurgical changes from prior left hemilaminectomy. Asymmetric disc protrusion to the left with marked stenosis of the left lateral recess. Mild left neural foraminal stenosis. Moderate facet joint arthropathy. L5-S1: No significant disc bulge. No neural foraminal stenosis. No central canal stenosis. Moderate facet joint arthropathy. IMPRESSION: 1.  No evidence of acute fracture or subluxation. 2. Progression of eccentric disc bulge on the left with marked lateral recess stenosis bilaterally, left worse than the right with mass effect on the descending nerve roots. Mild-to-moderate left neural foraminal stenosis. 3. Disc desiccation and progression of disc bulge asymmetric to the left with moderate left lateral recess stenosis. Mild left neural foraminal stenosis. Electronically Signed   By: Keane Police D.O.   On: 01/14/2022 12:38     I, Lynne Leader, personally (independently) visualized and performed the interpretation of the images attached in this note.   Assessment and Plan: 46 y.o. female with lumbar radiculopathy left side likely L5 or S1.  Based on MRI findings L5 is most likely.  Plan for epidural steroid injection already ordered.  Recheck back in 1  month.  Right arm symptoms less likely to be C6 radiculopathy as previously thought based on MRI report.  Patient does not have significant stenosis at C6 or C7 on MRI.  Most likely symptoms are secondary to radial tunnel syndrome.  Work on home exercise program specific for radial tunnel syndrome taught in clinic today by ATC check back in 1 month.  If not improving consider targeted injection for diagnostic and therapeutic purposes.  She does have potential for C4 nerve root impingement on the right and is a little worried about phrenic nerve involvement.  I do not think this is very likely but I offered that we could do a chest x-ray to look for diaphragm elevation.  She will think about it and let me know.   Discussed warning signs or symptoms. Please see discharge instructions. Patient expresses understanding.   The above documentation has been reviewed and is accurate and complete Lynne Leader, M.D.  Total encounter time 30 minutes including face-to-face time with the patient and, reviewing past medical record, and charting on the date of service.   Reviewed MRI findings discussed treatment plan and options as well as exercise teaching.

## 2022-01-16 ENCOUNTER — Ambulatory Visit (INDEPENDENT_AMBULATORY_CARE_PROVIDER_SITE_OTHER): Payer: 59 | Admitting: Family Medicine

## 2022-01-16 ENCOUNTER — Encounter: Payer: Self-pay | Admitting: Family Medicine

## 2022-01-16 VITALS — BP 102/70 | HR 92 | Ht 66.0 in | Wt 207.2 lb

## 2022-01-16 DIAGNOSIS — M5416 Radiculopathy, lumbar region: Secondary | ICD-10-CM

## 2022-01-16 DIAGNOSIS — G5631 Lesion of radial nerve, right upper limb: Secondary | ICD-10-CM

## 2022-01-16 NOTE — Patient Instructions (Addendum)
Good to see you today.  Proceed w/ epidural injection as previously ordered.  Please call [facility] at [phone] or [phone] to schedule.  Please perform the exercise program that we have prepared for you and gone over in detail on a daily basis.  In addition to the handout you were provided you can access your program through: [email or web address]   Your unique program code is:   [email or web address]  Follow-up: one month

## 2022-01-24 ENCOUNTER — Other Ambulatory Visit: Payer: 59

## 2022-01-29 ENCOUNTER — Ambulatory Visit
Admission: RE | Admit: 2022-01-29 | Discharge: 2022-01-29 | Disposition: A | Discharge status: Home / Self Care | Payer: 59 | Source: Ambulatory Visit | Attending: Family Medicine | Admitting: Family Medicine

## 2022-01-29 DIAGNOSIS — M5417 Radiculopathy, lumbosacral region: Secondary | ICD-10-CM | POA: Diagnosis not present

## 2022-01-29 IMAGING — XA Imaging study
2 series · 2 of 2 positions shown · non-contrast
Comparison: none

CLINICAL DATA: 45-year-old woman with left lumbosacral
radiculopathy presents radiology for L5-S1 epidural injection.

[Series 1: ortho standard · 1 of 1 slices shown (1 of 2)]
[im 1/1]
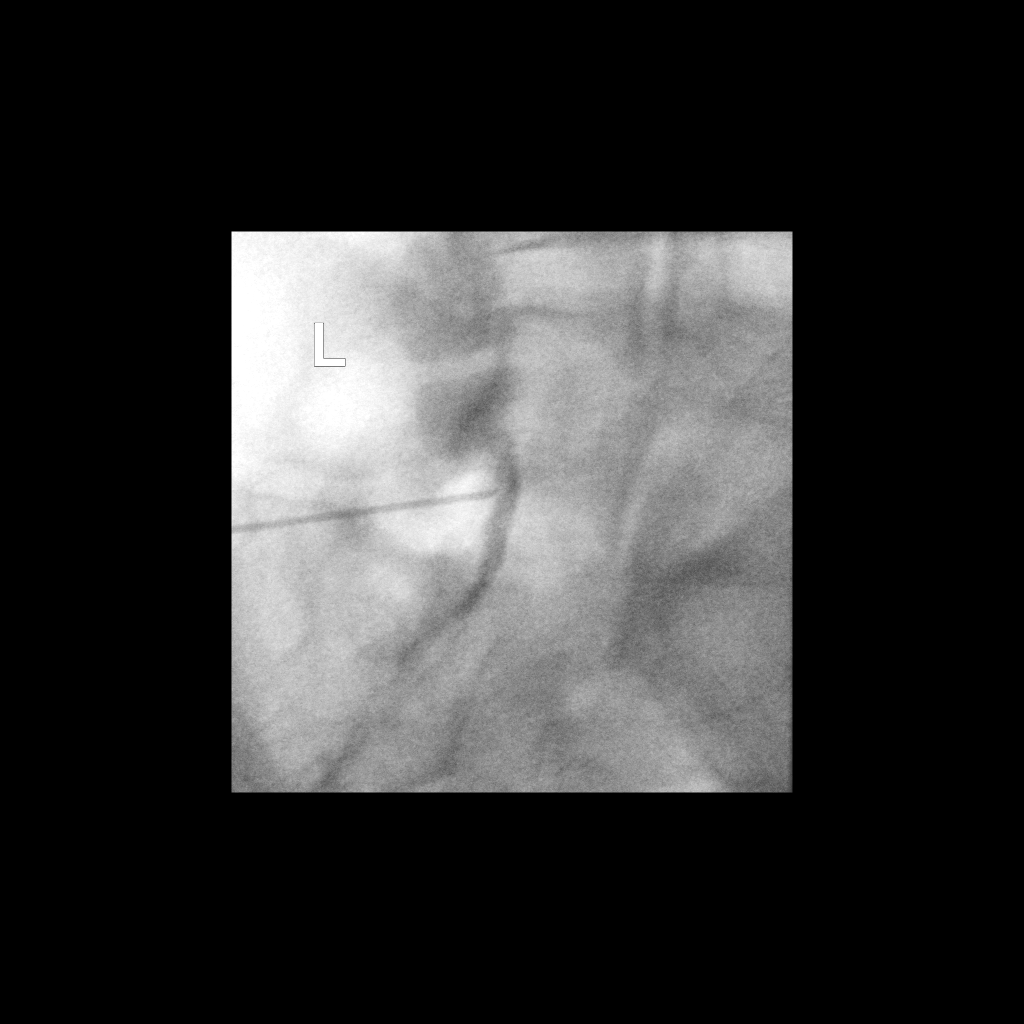

[Series 2: ortho standard · 1 of 1 slices shown (2 of 2)]
[im 1/1]
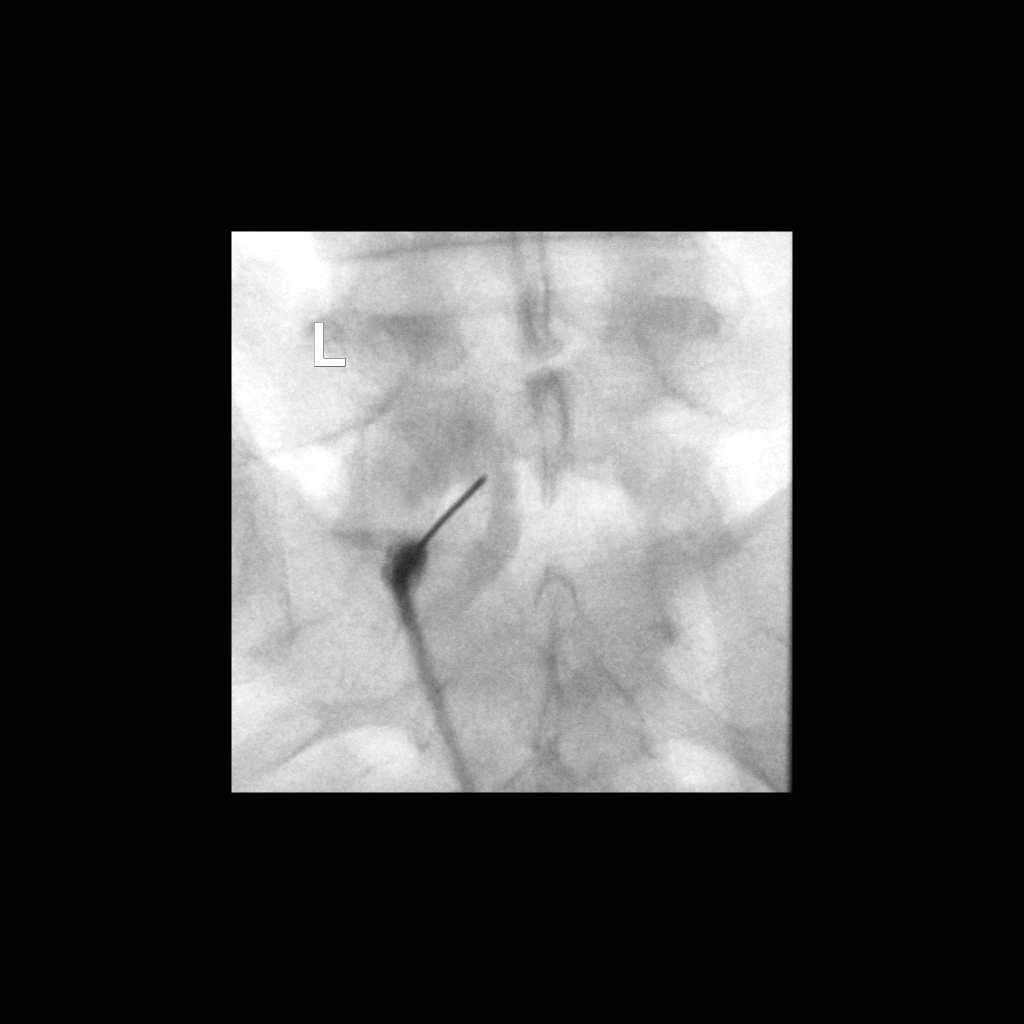

[2 of 2 positions shown; findings below may reference images not displayed]

FLUOROSCOPY:
Radiation Exposure Index (as provided by the fluoroscopic device):
4.9 mGy Kerma

PROCEDURE:
The procedure, risks, benefits, and alternatives were explained to
the patient. Questions regarding the procedure were encouraged and
answered. The patient understands and consents to the procedure.

LUMBAR EPIDURAL INJECTION:

An interlaminar approach was performed on left at L5-S1. The
overlying skin was cleansed and anesthetized. A 20 gauge epidural
needle was advanced using loss-of-resistance technique.

DIAGNOSTIC EPIDURAL INJECTION:

Injection of Isovue-M 200 shows a good epidural pattern with spread
above and below the level of needle placement, primarily on the left
no vascular opacification is seen.

THERAPEUTIC EPIDURAL INJECTION:

80 mg of Depo-Medrol mixed with 3 mL of lidocaine were instilled.
The procedure was well-tolerated, and the patient was discharged
thirty minutes following the injection in good condition.

COMPLICATIONS:
None immediate
IMPRESSION: Technically successful epidural injection on the left L5-S1 #1

## 2022-01-29 NOTE — Discharge Instructions (Signed)

## 2022-02-03 ENCOUNTER — Ambulatory Visit: Payer: 59 | Admitting: Family Medicine

## 2022-02-06 ENCOUNTER — Other Ambulatory Visit: Payer: Self-pay | Admitting: Physician Assistant

## 2022-02-06 ENCOUNTER — Other Ambulatory Visit (HOSPITAL_COMMUNITY): Payer: Self-pay

## 2022-02-06 MED ORDER — VALSARTAN-HYDROCHLOROTHIAZIDE 80-12.5 MG PO TABS
1.0000 | ORAL_TABLET | Freq: Every day | ORAL | 1 refills | Status: DC
  Filled 2022-02-06: qty 90, 90d supply, fill #0
  Filled 2022-05-20: qty 90, 90d supply, fill #1

## 2022-02-07 ENCOUNTER — Other Ambulatory Visit (HOSPITAL_COMMUNITY): Payer: Self-pay

## 2022-02-18 ENCOUNTER — Ambulatory Visit: Payer: 59 | Admitting: Family Medicine

## 2022-02-20 NOTE — Progress Notes (Signed)
I, Wendy Poet, LAT, ATC, am serving as scribe for Dr. Lynne Leader.  Crystal Madden is a 46 y.o. female who presents to [place] at [facility] today for  f/u of LBP due to L-sided lumbar radiculopathy per MRI and R UE paresthesias likely due to radial tunnel syndrome and possible carpal tunnel syndrome.  She was last seen by Dr. Georgina Snell on 01/16/22 and was referred for a lumbar ESI and shown a HEP for cubital tunnel syndrome.  She had a L5-S1 ESI on 01/29/22.  Today, pt reports improvement in the nerve pain in her hips. Pt notes pain w/ prolonged standing or sitting. Pt has been working on HEP, but continues to have pain in her R elbow.  Diagnostic testing: C-spine and L-spine MRI- 01/13/22; L-spine and C-spine XR- 12/30/21; C-spine and L-spine MRI- 10/19/19; L-spine XR- 08/25/19  Pertinent review of systems: No fevers or chills  Relevant historical information: Prediabetes and ADHD.   Exam:  BP 118/78   Pulse 90   Ht '5\' 6"'$  (1.676 m)   Wt 201 lb (91.2 kg)   SpO2 99%   BMI 32.44 kg/m  General: Well Developed, well nourished, and in no acute distress.   MSK: L-spine: Nontender midline.  Decreased lumbar motion.  Wrist bilaterally normal.  Normal motion.  Intact strength. Mildly positive Tinel's and positive Phalen's test bilaterally.    Lab and Radiology Results   EXAM: MRI LUMBAR SPINE WITHOUT CONTRAST   TECHNIQUE: Multiplanar, multisequence MR imaging of the lumbar spine was performed. No intravenous contrast was administered.   COMPARISON:  Prior MRI examination dated October 29, 2019   FINDINGS: Segmentation:  Standard.   Alignment: Straightening of the lumbar spine. Mild reversal of the lordosis at L2-L3.   Vertebrae: No fracture, evidence of discitis, or bone lesion. Endplate sclerotic changes prominent at L2-L3.   Conus medullaris and cauda equina: Conus extends to the L1 level. Conus and cauda equina appear normal.   Paraspinal and other  soft tissues: No acute abnormality.   Disc levels:   T12-L1: No significant disc bulge. No neural foraminal stenosis. No central canal stenosis.   L1-L2: Disc height loss and disc desiccation, unchanged. Mild facet joint arthropathy. No significant spinal canal or neural foraminal stenosis.   L2-L3: Disc height loss and prominent eccentric disc protrusion. Marked narrowing of the left lateral recess with moderate L2 neural foraminal stenosis. Mild right L2 neural foraminal stenosis. Moderate facet joint arthropathy.   L3-L4: Eccentric disc protrusion to the left with marked narrowing of the left lateral recess. Moderate facet joint arthropathy. Moderate bilateral neural foraminal stenosis, left worse than the right. Moderate facet joint arthropathy with joint effusion.   L4-L5: Disc desiccation and disc height loss. Postsurgical changes from prior left hemilaminectomy. Asymmetric disc protrusion to the left with marked stenosis of the left lateral recess. Mild left neural foraminal stenosis. Moderate facet joint arthropathy.   L5-S1: No significant disc bulge. No neural foraminal stenosis. No central canal stenosis. Moderate facet joint arthropathy.   IMPRESSION: 1.  No evidence of acute fracture or subluxation.   2. Progression of eccentric disc bulge on the left with marked lateral recess stenosis bilaterally, left worse than the right with mass effect on the descending nerve roots. Mild-to-moderate left neural foraminal stenosis.   3. Disc desiccation and progression of disc bulge asymmetric to the left with moderate left lateral recess stenosis. Mild left neural foraminal stenosis.     Electronically Signed   By:  Imran  Ahmed D.O.   On: 01/14/2022 12:38  I, Lynne Leader, personally (independently) visualized and performed the interpretation of the images attached in this note.    Assessment and Plan: 46 y.o. female with left lumbar radiculopathy improved with  interlaminar injection left L5-S1 January 29, 2022.  She still has some remaining back pain that we could proceed to facet injections for.  She will let me know if she would like to proceed with these injections and will help target the location of injection with her chronic pain by keeping a log.  Today's main symptoms are bilateral hand paresthesias and mostly occur when she wakes up in the morning.  This is thought to be primarily carpal tunnel syndrome.  Plan for night splints.  If not improving would recommend nerve conduction study.  Recheck in 3 months.   PDMP not reviewed this encounter. No orders of the defined types were placed in this encounter.  No orders of the defined types were placed in this encounter.    Discussed warning signs or symptoms. Please see discharge instructions. Patient expresses understanding.   The above documentation has been reviewed and is accurate and complete Lynne Leader, M.D.

## 2022-02-21 ENCOUNTER — Ambulatory Visit (INDEPENDENT_AMBULATORY_CARE_PROVIDER_SITE_OTHER): Payer: 59 | Admitting: Family Medicine

## 2022-02-21 VITALS — BP 118/78 | HR 90 | Ht 66.0 in | Wt 201.0 lb

## 2022-02-21 DIAGNOSIS — G5603 Carpal tunnel syndrome, bilateral upper limbs: Secondary | ICD-10-CM

## 2022-02-21 DIAGNOSIS — M5416 Radiculopathy, lumbar region: Secondary | ICD-10-CM

## 2022-02-21 NOTE — Patient Instructions (Signed)
Thank you for coming in today.   Please go to [facility] supply to get the Wrist Brace we talked about today. You may also be able to get it from [employer].  Wrist brace  Complex migraine  Check back in 3 months

## 2022-03-11 ENCOUNTER — Other Ambulatory Visit: Payer: Self-pay | Admitting: Physician Assistant

## 2022-03-11 ENCOUNTER — Other Ambulatory Visit (HOSPITAL_COMMUNITY): Payer: Self-pay

## 2022-03-11 MED ORDER — MOUNJARO 12.5 MG/0.5ML ~~LOC~~ SOAJ
12.5000 mg | SUBCUTANEOUS | 0 refills | Status: DC
  Filled 2022-03-11: qty 2, 28d supply, fill #0

## 2022-03-19 ENCOUNTER — Encounter (INDEPENDENT_AMBULATORY_CARE_PROVIDER_SITE_OTHER): Payer: Self-pay

## 2022-03-19 ENCOUNTER — Other Ambulatory Visit (HOSPITAL_COMMUNITY): Payer: Self-pay

## 2022-04-11 ENCOUNTER — Other Ambulatory Visit (HOSPITAL_COMMUNITY): Payer: Self-pay

## 2022-04-15 ENCOUNTER — Other Ambulatory Visit: Payer: Self-pay | Admitting: Physician Assistant

## 2022-04-15 ENCOUNTER — Other Ambulatory Visit (HOSPITAL_COMMUNITY): Payer: Self-pay

## 2022-04-15 MED ORDER — MOUNJARO 12.5 MG/0.5ML ~~LOC~~ SOAJ
12.5000 mg | SUBCUTANEOUS | 0 refills | Status: DC
  Filled 2022-04-15 – 2022-04-29 (×4): qty 2, 28d supply, fill #0

## 2022-04-15 MED ORDER — VYVANSE 60 MG PO CHEW
60.0000 mg | CHEWABLE_TABLET | Freq: Every day | ORAL | 0 refills | Status: DC
Start: 2022-04-11 — End: 2022-10-22
  Filled 2022-04-15: qty 30, 30d supply, fill #0

## 2022-04-16 ENCOUNTER — Other Ambulatory Visit (HOSPITAL_COMMUNITY): Payer: Self-pay

## 2022-04-22 ENCOUNTER — Other Ambulatory Visit (HOSPITAL_COMMUNITY): Payer: Self-pay

## 2022-04-22 ENCOUNTER — Encounter: Payer: Self-pay | Admitting: Physician Assistant

## 2022-04-24 NOTE — Telephone Encounter (Signed)
Received fax from [place] [facility] needed for Mounjaro 12.5 mg. [employer] form filled out and faxed to [phone]. Awaiting response.

## 2022-04-29 ENCOUNTER — Other Ambulatory Visit (HOSPITAL_COMMUNITY): Payer: Self-pay

## 2022-04-30 NOTE — Telephone Encounter (Signed)
Please see pt's message about Mounjaro. It has been denied by insurance.

## 2022-04-30 NOTE — Telephone Encounter (Signed)
Received fax from [employer] [facility] for Darcel Bayley has been denied due to patient is not diabetic.

## 2022-05-05 ENCOUNTER — Encounter: Payer: Self-pay | Admitting: *Deleted

## 2022-05-07 ENCOUNTER — Other Ambulatory Visit (HOSPITAL_COMMUNITY): Payer: Self-pay

## 2022-05-07 ENCOUNTER — Encounter: Payer: Self-pay | Admitting: Physician Assistant

## 2022-05-07 ENCOUNTER — Ambulatory Visit (INDEPENDENT_AMBULATORY_CARE_PROVIDER_SITE_OTHER): Payer: 59 | Admitting: Physician Assistant

## 2022-05-07 VITALS — BP 110/72 | HR 106 | Temp 97.7°F | Ht 66.0 in | Wt 204.0 lb

## 2022-05-07 DIAGNOSIS — E669 Obesity, unspecified: Secondary | ICD-10-CM

## 2022-05-07 MED ORDER — WEGOVY 0.5 MG/0.5ML ~~LOC~~ SOAJ
0.5000 mg | SUBCUTANEOUS | 0 refills | Status: DC
  Filled 2022-05-07 – 2022-05-15 (×3): qty 2, 28d supply, fill #0
  Filled 2022-05-20: qty 2, fill #0

## 2022-05-07 MED ORDER — WEGOVY 1 MG/0.5ML ~~LOC~~ SOAJ
1.0000 mg | SUBCUTANEOUS | 0 refills | Status: DC
  Filled 2022-05-07 – 2022-05-20 (×5): qty 2, 28d supply, fill #0

## 2022-05-07 NOTE — Progress Notes (Signed)
Crystal Madden is a 46 y.o. female here for a follow up of a pre-existing problem.  History of Present Illness:   Chief Complaint  Patient presents with   Obesity    Pt is here to discuss other options for weight loss. Mounjaro denied by insurance.    HPI  Obesity She is currently not on any medication She was on Mounjaro and tolerating well She got up 12.5 mg weekly dosage Her weight got down to 192 lb She is exercising and eating well She states that she is no longer able to get this due to insurance purposes She would like a new rx if possible   Past Medical History:  Diagnosis Date   Anxiety    Depression    High blood pressure    Hyperlipidemia    Lower back pain    Obesity (BMI 30-39.9)      Social History   Tobacco Use   Smoking status: Former    Types: Cigarettes    Quit date: 08/27/2004    Years since quitting: 17.7   Smokeless tobacco: Never  Vaping Use   Vaping Use: Never used  Substance Use Topics   Alcohol use: Yes    Comment: Ocassional.   Drug use: No    Past Surgical History:  Procedure Laterality Date   BLADDER SUSPENSION     CESAREAN SECTION     LUMBAR MICRODISCECTOMY  2018   TONSILLECTOMY  2000    Family History  Problem Relation Age of Onset   Hypertension Mother    Hyperlipidemia Mother    Obesity Mother    COPD Father    Cancer Father    Heart disease Father    Hypertension Father    Stroke Father    Cancer Maternal Grandfather    Cancer Paternal Grandmother    Colon cancer Neg Hx     Allergies  Allergen Reactions   Amlodipine Swelling    Lower leg swelling when increased to 10 mg    Current Medications:   Current Outpatient Medications:    ALPRAZolam (XANAX) 0.5 MG tablet, TAKE 1 TABLET BY MOUTH IN THE AM AND 1/2 TABLET IN THE AFTERNOON (Patient taking differently: Take 0.5 mg by mouth daily as needed for anxiety.), Disp: 30 tablet, Rfl: 2   bimatoprost (LATISSE) 0.03 % ophthalmic solution, PLACE 1 DROP INTO  BOTH EYES SEE ADMIN INSTRUCTIONS. PLACE 1 DROP ONTO APPLICATOR & APPLY EVENLY ALONG SKIN OF UPPER LID AT BASE OF LASHES TO BOTH EYES AT BEDTIME, Disp: 3 mL, Rfl: 12   Drospirenone (SLYND) 4 MG TABS, Take 1 tablet by mouth daily., Disp: 84 tablet, Rfl: 4   Lisdexamfetamine Dimesylate (VYVANSE) 60 MG CHEW, Chew 1 tablet (60 mg) by mouth daily., Disp: 30 tablet, Rfl: 0   Semaglutide-Weight Management (WEGOVY) 0.5 MG/0.5ML SOAJ, Inject 0.5 mg into the skin once a week., Disp: 2 mL, Rfl: 0   Semaglutide-Weight Management (WEGOVY) 1 MG/0.5ML SOAJ, Inject 1 mg into the skin once a week., Disp: 2 mL, Rfl: 0   valsartan-hydrochlorothiazide (DIOVAN-HCT) 80-12.5 MG tablet, Take 1 tablet by mouth daily., Disp: 90 tablet, Rfl: 1   Review of Systems:   ROS Negative unless otherwise specified per HPI.  Vitals:   Vitals:   05/07/22 1354  BP: 110/72  Pulse: (!) 106  Temp: 97.7 F (36.5 C)  TempSrc: Temporal  SpO2: 98%  Weight: 204 lb (92.5 kg)  Height: '5\' 6"'$  (1.676 m)     Body mass index  is 32.93 kg/m.  Physical Exam:   Physical Exam Vitals and nursing note reviewed.  Constitutional:      General: She is not in acute distress.    Appearance: She is well-developed. She is not ill-appearing or toxic-appearing.  Cardiovascular:     Rate and Rhythm: Normal rate and regular rhythm.     Pulses: Normal pulses.     Heart sounds: Normal heart sounds, S1 normal and S2 normal.  Pulmonary:     Effort: Pulmonary effort is normal.     Breath sounds: Normal breath sounds.  Skin:    General: Skin is warm and dry.  Neurological:     Mental Status: She is alert.     GCS: GCS eye subscore is 4. GCS verbal subscore is 5. GCS motor subscore is 6.  Psychiatric:        Speech: Speech normal.        Behavior: Behavior normal. Behavior is cooperative.     Assessment and Plan:   Obesity, unspecified classification, unspecified obesity type, unspecified whether serious comorbidity present Will trial  Wegovy 0.5 mg weekly x 1 month, then increase to [facility] 1 mg weekly We can continue to advance dose q month as long as she is tolerating this well Follow-up in 3-6 mo, sooner if concerns  Inda Coke, PA-C

## 2022-05-09 ENCOUNTER — Other Ambulatory Visit (HOSPITAL_COMMUNITY): Payer: Self-pay

## 2022-05-14 ENCOUNTER — Other Ambulatory Visit (HOSPITAL_COMMUNITY): Payer: Self-pay

## 2022-05-15 ENCOUNTER — Other Ambulatory Visit: Payer: Self-pay | Admitting: Physician Assistant

## 2022-05-15 ENCOUNTER — Other Ambulatory Visit (HOSPITAL_COMMUNITY): Payer: Self-pay

## 2022-05-15 ENCOUNTER — Encounter: Payer: Self-pay | Admitting: Physician Assistant

## 2022-05-15 MED ORDER — TIRZEPATIDE 12.5 MG/0.5ML ~~LOC~~ SOAJ
12.5000 mg | SUBCUTANEOUS | 0 refills | Status: DC
  Filled 2022-05-15: qty 2, 28d supply, fill #0

## 2022-05-20 ENCOUNTER — Other Ambulatory Visit (HOSPITAL_COMMUNITY): Payer: Self-pay

## 2022-05-20 MED ORDER — LISDEXAMFETAMINE DIMESYLATE 60 MG PO CHEW
1.0000 | CHEWABLE_TABLET | Freq: Every day | ORAL | 0 refills | Status: DC
  Filled 2022-05-20: qty 30, 30d supply, fill #0

## 2022-05-21 ENCOUNTER — Other Ambulatory Visit (HOSPITAL_COMMUNITY): Payer: Self-pay

## 2022-05-22 ENCOUNTER — Other Ambulatory Visit (HOSPITAL_COMMUNITY): Payer: Self-pay

## 2022-05-22 ENCOUNTER — Telehealth: Payer: Self-pay | Admitting: *Deleted

## 2022-05-22 NOTE — Telephone Encounter (Signed)
Spoke to pt told her I started the PA for Wegovy  0.5 mg but they want to know if you are going to continue [facility] or start [facility]? Pt said she will continue Mounjaro as long as it is covered. Pt said she was very surprised when she went to pick it up that is was covered and only $25.00. Told pt that is fine I have until 11/23 to finish the PA paperwork. If you get medication again and it is not covered let me know and I will finish the PA. Pt verbalized understanding.

## 2022-05-22 NOTE — Progress Notes (Signed)
I, Peterson Lombard, LAT, ATC acting as a scribe for Lynne Leader, MD.  Crystal Madden is a 46 y.o. female who presents to [place] at [facility] today for her 71-monthfollow-up of left-sided lumbar radiculopathy and bilateral carpal tunnel syndrome.  Patient was last seen by Dr. CGeorgina Snellon 02/21/2022 and was advised to use night splints.  Patient's last ESI was performed on 01/29/2022. Today, patient reports low back is feeling pretty good and only really bothers her w/ prolonged sitting or standing. Pt also feels like bilat wrist are also doing pretty good.   Today, pt c/o R-sided neck pain that goes into the superior aspect of the R shoulder.  Radiates: no Numbness/tingling: sometimes, but hard to determine root cause Weakness: no Aggravates: trying to sleep Treatments tried: stretch, theragun, heat  Dx imaging: 01/13/2022 L-spine & c-spine MRI 12/30/2021 L-spine & c-spine x-ray 09/06/2021 right hip x-ray  Pertinent review of systems: No fevers or chills  Relevant historical information: ADHD   Exam:  BP (!) 148/90   Pulse 95   Ht '5\' 6"'$  (1.676 m)   Wt 204 lb (92.5 kg)   SpO2 99%   BMI 32.93 kg/m  General: Well Developed, well nourished, and in no acute distress.   MSK: Right shoulder: Normal-appearing Tender palpation right trapezius. Normal shoulder motion pain with abduction and internal rotation. Positive Hawkins and Neer's test. Positive empty can test. Negative Yergason's and speeds test. Shoulder strength is intact.  C-spine: Norma appearing l.   Tender palpation C-spine paraspinal musculature right.  Nontender midline. Decreased cervical motion. Upper extremity strength is intact.   Lab and Radiology Results  Procedure: Real-time Ultrasound Guided Injection of right shoulder subacromial bursa Device: Philips Affiniti 50G Images permanently stored and available for review in PACS Ultrasound evaluation prior to injection reveals moderate  subacromial bursitis. Verbal informed consent obtained.  Discussed risks and benefits of procedure. Warned about infection, bleeding, hyperglycemia damage to structures among others. Patient expresses understanding and agreement Time-out conducted.   Noted no overlying erythema, induration, or other signs of local infection.   Skin prepped in a sterile fashion.   Local anesthesia: Topical Ethyl chloride.   With sterile technique and under real time ultrasound guidance: 40 mg of Kenalog and 2 mL of Marcaine injected into subacromial bursa. Fluid seen entering the bursa.   Completed without difficulty   Pain immediately resolved suggesting accurate placement of the medication.   Advised to call if fevers/chills, erythema, induration, drainage, or persistent bleeding.   Images permanently stored and available for review in the ultrasound unit.  Impression: Technically successful ultrasound guided injection.    EXAM: MRI CERVICAL SPINE WITHOUT CONTRAST   TECHNIQUE: Multiplanar, multisequence MR imaging of the cervical spine was performed. No intravenous contrast was administered.   COMPARISON:  10/29/2019   FINDINGS: Alignment: Straightening of cervical lordosis, also seen previously   Vertebrae: No fracture, evidence of discitis, or bone lesion.   Cord: Normal signal   Posterior Fossa, vertebral arteries, paraspinal tissues: Negative   Disc levels:   C2-3: Unremarkable.   C3-4: Central disc protrusion which is new/progressed and indents the right ventral cord. Additional right foraminal protrusion which is chronic.   C4-5: Disc bulging with shallow left foraminal protrusion. Upward pointing central disc protrusion. Mild to moderate left foraminal narrowing.   C5-6: Foraminal predominant disc bulging   C6-7: Mild disc bulging.  No neural compression   C7-T1:Unremarkable.   IMPRESSION: 1. C3-4 central disc protrusion indenting  the right ventral cord, new/progressed  from 2021. Chronic right foraminal protrusion at this level which could affect the right C4 nerve root. 2. Mild to moderate left foraminal narrowing at C4-5     Electronically Signed   By: Jorje Guild M.D.   On: 01/14/2022 12:54 I, Lynne Leader, personally (independently) visualized and performed the interpretation of the images attached in this note.      Assessment and Plan: 46 y.o. female with right shoulder pain and right lateral neck pain.  Pain is multifactorial.  Majority of pain thought to be due to subacromial impingement and bursitis.  Plan for subacromial injection today. Additionally she has lateral neck pain thought primarily due to muscle dysfunction.  There may be a component of right C4 cervical radiculopathy as well.  Again physical therapy should be quite helpful.  Recheck back in 6 weeks.   PDMP not reviewed this encounter. Orders Placed This Encounter  Procedures   [place] LIMITED JOINT SPACE STRUCTURES UP RIGHT(NO LINKED CHARGES)    Order Specific Question:   Reason for Exam (SYMPTOM  OR DIAGNOSIS REQUIRED)    Answer:   right shoulder pain    Order Specific Question:   Preferred imaging location?    Answer:   [place]   Ambulatory referral to Physical Therapy    Referral Priority:   Routine    Referral Type:   Physical Medicine    Referral Reason:   Specialty Services Required    Requested Specialty:   Physical Therapy    Number of Visits Requested:   1   No orders of the defined types were placed in this encounter.    Discussed warning signs or symptoms. Please see discharge instructions. Patient expresses understanding.   The above documentation has been reviewed and is accurate and complete Lynne Leader, M.D.

## 2022-05-22 NOTE — Telephone Encounter (Signed)
Left message on voicemail to call office. Need to know if pt is going to continue [facility] or switch to [facility]?  I did PA for [facility] and I received a fax from [place] and they are questioning what pt is doing, because there is a approval for [facility].

## 2022-05-23 ENCOUNTER — Ambulatory Visit: Payer: Self-pay

## 2022-05-23 ENCOUNTER — Other Ambulatory Visit (HOSPITAL_COMMUNITY): Payer: Self-pay

## 2022-05-23 ENCOUNTER — Ambulatory Visit (INDEPENDENT_AMBULATORY_CARE_PROVIDER_SITE_OTHER): Payer: 59 | Admitting: Family Medicine

## 2022-05-23 VITALS — BP 148/90 | HR 95 | Ht 66.0 in | Wt 204.0 lb

## 2022-05-23 DIAGNOSIS — G8929 Other chronic pain: Secondary | ICD-10-CM | POA: Diagnosis not present

## 2022-05-23 DIAGNOSIS — M25511 Pain in right shoulder: Secondary | ICD-10-CM

## 2022-05-23 DIAGNOSIS — M5412 Radiculopathy, cervical region: Secondary | ICD-10-CM

## 2022-05-23 NOTE — Patient Instructions (Addendum)
Thank you for coming in today.   You received an injection today. Seek immediate medical attention if the joint becomes red, extremely painful, or is oozing fluid.   Recheck in 6 weeks.   I've referred you to Physical Therapy.  Let us know if you don't hear from them in one week.

## 2022-05-26 ENCOUNTER — Encounter: Payer: Self-pay | Admitting: Physician Assistant

## 2022-05-26 ENCOUNTER — Other Ambulatory Visit (HOSPITAL_COMMUNITY): Payer: Self-pay

## 2022-05-26 ENCOUNTER — Ambulatory Visit (INDEPENDENT_AMBULATORY_CARE_PROVIDER_SITE_OTHER): Payer: 59 | Admitting: Physical Therapy

## 2022-05-26 ENCOUNTER — Encounter: Payer: Self-pay | Admitting: Physical Therapy

## 2022-05-26 ENCOUNTER — Ambulatory Visit (INDEPENDENT_AMBULATORY_CARE_PROVIDER_SITE_OTHER): Payer: 59 | Admitting: Physician Assistant

## 2022-05-26 VITALS — BP 120/78 | HR 89 | Temp 97.7°F | Ht 66.0 in | Wt 205.5 lb

## 2022-05-26 DIAGNOSIS — M62838 Other muscle spasm: Secondary | ICD-10-CM

## 2022-05-26 DIAGNOSIS — Z0000 Encounter for general adult medical examination without abnormal findings: Secondary | ICD-10-CM

## 2022-05-26 DIAGNOSIS — Z1159 Encounter for screening for other viral diseases: Secondary | ICD-10-CM

## 2022-05-26 DIAGNOSIS — M542 Cervicalgia: Secondary | ICD-10-CM

## 2022-05-26 DIAGNOSIS — I10 Essential (primary) hypertension: Secondary | ICD-10-CM

## 2022-05-26 DIAGNOSIS — Z23 Encounter for immunization: Secondary | ICD-10-CM

## 2022-05-26 DIAGNOSIS — E669 Obesity, unspecified: Secondary | ICD-10-CM | POA: Diagnosis not present

## 2022-05-26 DIAGNOSIS — M25511 Pain in right shoulder: Secondary | ICD-10-CM

## 2022-05-26 LAB — CBC WITH DIFFERENTIAL/PLATELET
Basophils Absolute: 0.1 10*3/uL (ref 0.0–0.1)
Basophils Relative: 0.8 % (ref 0.0–3.0)
Eosinophils Absolute: 0 10*3/uL (ref 0.0–0.7)
Eosinophils Relative: 0 % (ref 0.0–5.0)
HCT: 44 % (ref 36.0–46.0)
Hemoglobin: 15 g/dL (ref 12.0–15.0)
Lymphocytes Relative: 18.9 % (ref 12.0–46.0)
Lymphs Abs: 2 10*3/uL (ref 0.7–4.0)
MCHC: 34 g/dL (ref 30.0–36.0)
MCV: 98.7 fl (ref 78.0–100.0)
Monocytes Absolute: 0.4 10*3/uL (ref 0.1–1.0)
Monocytes Relative: 4.2 % (ref 3.0–12.0)
Neutro Abs: 7.9 10*3/uL — ABNORMAL HIGH (ref 1.4–7.7)
Neutrophils Relative %: 76.1 % (ref 43.0–77.0)
Platelets: 250 10*3/uL (ref 150.0–400.0)
RBC: 4.46 Mil/uL (ref 3.87–5.11)
RDW: 13.1 % (ref 11.5–15.5)
WBC: 10.4 10*3/uL (ref 4.0–10.5)

## 2022-05-26 LAB — LIPID PANEL
Cholesterol: 226 mg/dL — ABNORMAL HIGH (ref 0–200)
HDL: 60.4 mg/dL (ref >39.00)
NonHDL: 165.31
Total CHOL/HDL Ratio: 4
Triglycerides: 232 mg/dL — ABNORMAL HIGH (ref 0.0–149.0)
VLDL: 46.4 mg/dL — ABNORMAL HIGH (ref 0.0–40.0)

## 2022-05-26 LAB — COMPREHENSIVE METABOLIC PANEL
ALT: 31 U/L (ref 0–35)
AST: 18 U/L (ref 0–37)
Albumin: 4.6 g/dL (ref 3.5–5.2)
Alkaline Phosphatase: 53 U/L (ref 39–117)
BUN: 14 mg/dL (ref 6–23)
CO2: 25 mEq/L (ref 19–32)
Calcium: 9.9 mg/dL (ref 8.4–10.5)
Chloride: 107 mEq/L (ref 96–112)
Creatinine, Ser: 0.83 mg/dL (ref 0.40–1.20)
GFR: 85.8 mL/min (ref >60.00)
Glucose, Bld: 115 mg/dL — ABNORMAL HIGH (ref 70–99)
Potassium: 4.3 mEq/L (ref 3.5–5.1)
Sodium: 141 mEq/L (ref 135–145)
Total Bilirubin: 0.5 mg/dL (ref 0.2–1.2)
Total Protein: 7.8 g/dL (ref 6.0–8.3)

## 2022-05-26 LAB — LDL CHOLESTEROL, DIRECT: Direct LDL: 147 mg/dL

## 2022-05-26 MED ORDER — ONDANSETRON HCL 4 MG PO TABS
4.0000 mg | ORAL_TABLET | Freq: Three times a day (TID) | ORAL | 0 refills | Status: DC | PRN
  Filled 2022-05-26: qty 20, 7d supply, fill #0

## 2022-05-26 NOTE — Therapy (Signed)
OUTPATIENT PHYSICAL THERAPY UPPER EXTREMITY EVALUATION   Patient Name: Crystal Madden MRN: [id number] DOB:1976-05-19, 46 y.o., female Today's Date: 05/26/2022   PT End of Session - 05/26/22 1335     Visit Number 1    Number of Visits 16    Date for PT Re-Evaluation 07/21/22    Authorization Type [facility]    PT Start Time 1017    PT Stop Time 1052    PT Time Calculation (min) 35 min    Activity Tolerance Patient tolerated treatment well    Behavior During Therapy WFL for tasks assessed/performed             Past Medical History:  Diagnosis Date   Anxiety    Depression    High blood pressure    Hyperlipidemia    Lower back pain    Obesity (BMI 30-39.9)    Past Surgical History:  Procedure Laterality Date   BLADDER SUSPENSION     CESAREAN SECTION     LUMBAR MICRODISCECTOMY  2018   [place] SURGERY     TONSILLECTOMY  2000   Patient Active Problem List   Diagnosis Date Noted   Cervical disc disorder with radiculopathy 03/09/2020   Insulin resistance 12/29/2019   Hypertension 06/14/2019   Vitamin D deficiency 08/28/2018   BMI 33.0-33.9,adult 08/27/2018   Attention deficit hyperactivity disorder (ADHD), combined type 08/27/2018   Diverticulitis of sigmoid colon 07/22/2016    PCP: Inda Coke  REFERRING PROVIDER: Lynne Leader   REFERRING DIAG: R shoulder, neck pain.   THERAPY DIAG:  Cervicalgia  Acute pain of right shoulder  Other muscle spasm  Rationale for Evaluation and Treatment Rehabilitation  ONSET DATE:   SUBJECTIVE:                                                                                                                                                                                      SUBJECTIVE STATEMENT:  Pt was having R shoulder pain. Did get injection last week that has helped significantly. Still a bit sore. She now notes mostly tightness in R UT region. She has had ongoing neck pain in past years, with tingling into R UE.  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    PERTINENT HISTORY: none   PAIN:  Are you having pain? Yes: NPRS scale: 5/10 Pain location: Tightness Bil neck R > L.  Pain description: tightness Aggravating factors: none stated Relieving factors: none stated    PRECAUTIONS: None   WEIGHT BEARING RESTRICTIONS: No  FALLS:  Has patient fallen in last 6 months?  No    PLOF: Independent  PATIENT GOALS: Decreased pain and tightness in neck.     OBJECTIVE:   DIAGNOSTIC FINDINGS:  MRI 01/13/22: neck IMPRESSION: 1. C3-4 central disc protrusion indenting the right ventral cord, new/progressed from 2021. Chronic right foraminal protrusion at this level which could affect the right C4 nerve root. 2. Mild to moderate left foraminal narrowing at C4-5   COGNITION: Overall cognitive status: Within functional limits for tasks assessed      POSTURE:   UPPER EXTREMITY ROM:   Shoulders: WFL  Neck: WFL  pain with full R rotation and R SB.   UPPER EXTREMITY MMT:   Shoulders: 4+/5     JOINT MOBILITY TESTING:    PALPATION:  Tightness in bil UT, levator, and into R cervical paraspinals.     TODAY'S TREATMENT:                                                                                                                                                    DATE:   05/26/2022 Manual: STM to R UT and levator, manual cervical traction 10 sec x 10, manual UT and levator stretches.  Trigger Point Dry-Needling  Treatment instructions: Expect mild to moderate muscle soreness. S/S of pneumothorax if dry needled over a lung field, and to seek immediate medical attention should they occur. Patient verbalized understanding of these instructions and education.  Patient Consent Given: Yes Education handout provided: Previously provided Muscles treated: R UT and levator Electrical stimulation performed:  No Parameters: N/A Treatment response/outcome: Twitch response, palpable increase in muscle length.      PATIENT EDUCATION: Education details: PT POC, Exam findings, HEP Person educated: Patient Education method: Explanation, Demonstration, Tactile cues, Verbal cues, and Handouts Education comprehension: verbalized understanding, returned demonstration, verbal cues required, tactile cues required, and needs further education   HOME EXERCISE PROGRAM: Will issue next visit.     ASSESSMENT:  CLINICAL IMPRESSION: Patient presents with primary complaint of increased pain in neck and R shoulder. R shoulder pain much improved since recent injection. She does have increased muscle tension and pain neck region, R>L. She also has mild pain with full cervical rotation. She has decreased ability for full functional activity due to pain. Pt to benefit from skilled PT to improve deficits and pain.    OBJECTIVE IMPAIRMENTS: decreased activity tolerance, decreased mobility, increased fascial restrictions, increased muscle spasms, improper body mechanics, and pain.   ACTIVITY LIMITATIONS: carrying, lifting, and reach over head  PARTICIPATION LIMITATIONS: cleaning, laundry, shopping, community activity, and occupation  PERSONAL FACTORS:  none  are also affecting patient's functional outcome.   REHAB POTENTIAL: Good  CLINICAL DECISION MAKING: Stable/uncomplicated  EVALUATION COMPLEXITY: Low   GOALS: Goals reviewed with patient? Yes  SHORT TERM GOALS: Target date: 06/09/2022    Pt to be independent with initial HEP  Goal status: INITIAL  2.  Pt to report decreased pain and tightness in neck to 0-3/10   Goal status: INITIAL    LONG TERM GOALS: Target date: 07/21/2022    Pt to be independent with final HEP   Goal status: INITIAL  2.  Pt to report decreased pain in R neck and shoulder to 0-2/10 with activity   Goal status: INITIAL  3.  Pt to demo soft tissue limitations to  be WNL, for decreased pain   Goal status: INITIAL  4.   Pt to demo full ROM for neck and shoulder, to be pain free, to improve ability for lift, reach, carry, and IADLs.   Goal status: INITIAL     PLAN: PT FREQUENCY: 1-2x/week  PT DURATION: 8 weeks  PLANNED INTERVENTIONS: Therapeutic exercises, Therapeutic activity, Neuromuscular re-education, Patient/Family education, Self Care, Joint mobilization, Joint manipulation, DME instructions, Dry Needling, Electrical stimulation, Spinal manipulation, Spinal mobilization, Cryotherapy, Moist heat, Taping, Traction, Ionotophoresis '4mg'$ /ml Dexamethasone, and Manual therapy  PLAN FOR NEXT SESSION:    Lyndee Hensen, PT, DPT 3:33 PM  05/26/22

## 2022-05-26 NOTE — Patient Instructions (Signed)
It was great to see you!  Please schedule your mammogram with breast center at [facility].  Please go to the lab for blood work.   Our office will call you with your results unless you have chosen to receive results via MyChart.  If your blood work is normal we will follow-up each year for physicals and as scheduled for chronic medical problems.  If anything is abnormal we will treat accordingly and get you in for a follow-up.  Take care,  Aldona Bar

## 2022-05-26 NOTE — Progress Notes (Signed)
Subjective:    Crystal Madden is a 46 y.o. female and is here for a comprehensive physical exam.  HPI  Health Maintenance Due  Topic Date Due   Hepatitis C Screening  Never done   Acute Concerns: No acute concerns this visit.  Chronic Issues: Nausea Patient is requesting to switch to Zofran as she is more nausea with 12.5 mg Mounjaro as she stopped for about a month. She denies cramping on Mournjaro with a some constipation which she use an OTC product to relieve her symptoms.  HTN Currently taking valsartan-hctz 80-12.5 mg. At home blood pressure readings are: WNL. Patient denies chest pain, SOB, blurred vision, dizziness, unusual headaches, lower leg swelling. Patient is compliant with medication. Denies excessive caffeine intake, stimulant usage, excessive alcohol intake, or increase in salt consumption.  BP Readings from Last 3 Encounters:  05/26/22 120/78  05/23/22 (!) 148/90  05/07/22 110/72   Health Maintenance: Immunizations -- She is receiving influenza vaccine this visit. Mammogram last completed on 3/31/3/022. PAP last completed on 08/15/2015. Diet -- Reports that had salty cravings when off of 12.5 Mounjaro, but since gotten diet under control. Exercise -- She reports that she does exercise some by taking walks, but does nothing strenuous.   Social history- She reports there are no new changes in family history. Mood -- Patient reports she is in a stable mood with no concerns.  UTD with dentist? - She is UTD on dental care. UTD with eye doctor? - She is not UTD on vision care.  Blood pressure Patient reports no concerns with blood pressure. BP Readings from Last 3 Encounters:  05/26/22 120/78  05/23/22 (!) 148/90  05/07/22 110/72   Pulse Readings from Last 3 Encounters:  05/26/22 89  05/23/22 95  05/07/22 (!) 106     Weight history: Wt Readings from Last 10 Encounters:  05/26/22 205 lb 8 oz (93.2 kg)  05/23/22 204 lb (92.5 kg)  05/07/22 204  lb (92.5 kg)  02/21/22 201 lb (91.2 kg)  01/16/22 207 lb 3.2 oz (94 kg)  12/30/21 206 lb 12.8 oz (93.8 kg)  12/24/21 208 lb 8 oz (94.6 kg)  09/06/21 224 lb 9.6 oz (101.9 kg)  09/04/21 222 lb (100.7 kg)  07/15/21 231 lb (104.8 kg)   Body mass index is 33.17 kg/m. No LMP recorded. (Menstrual status: Oral contraceptives).  Alcohol use:  reports current alcohol use.  Tobacco use:  Tobacco Use: Medium Risk (05/26/2022)   Patient History    Smoking Tobacco Use: Former    Smokeless Tobacco Use: Never    Passive Exposure: Not on file       05/26/2022    8:07 AM  Depression screen PHQ 2/9  Decreased Interest 0  Down, Depressed, Hopeless 0  PHQ - 2 Score 0  Altered sleeping 0  Tired, decreased energy 1  Change in appetite 1  Feeling bad or failure about yourself  0  Trouble concentrating 0  Moving slowly or fidgety/restless 0  Suicidal thoughts 0  PHQ-9 Score 2  Difficult doing work/chores Not difficult at all     Other providers/specialists: Patient Care Team: Inda Coke, [place] as PCP - General (Physician Assistant) Lyndee Hensen, PT as Physical Therapist (Physical Therapy) Alda Berthold, DO as Consulting Physician (Neurology)    PMHx, SurgHx, SocialHx, Medications, and Allergies were reviewed in the Visit Navigator and updated as appropriate.   Past Medical History:  Diagnosis Date   Anxiety    Depression  High blood pressure    Hyperlipidemia    Lower back pain    Obesity (BMI 30-39.9)      Past Surgical History:  Procedure Laterality Date   BLADDER SUSPENSION     CESAREAN SECTION     LUMBAR MICRODISCECTOMY  2018   [facility] SURGERY     TONSILLECTOMY  2000     Family History  Problem Relation Age of Onset   Hypertension Mother    Hyperlipidemia Mother    Obesity Mother    COPD Father        2PPD smoker   Lung cancer Father    Heart disease Father    Hypertension Father    Stroke Father    Lung cancer Maternal Grandfather        smoker    ADD / ADHD Son    Colon cancer Neg Hx     Social History   Tobacco Use   Smoking status: Former    Types: Cigarettes    Quit date: 08/27/2004    Years since quitting: 17.7   Smokeless tobacco: Never  Vaping Use   Vaping Use: Never used  Substance Use Topics   Alcohol use: Yes    Comment: Ocassional.   Drug use: No    Review of Systems:   Review of Systems  Constitutional:  Negative for chills, fever, malaise/fatigue and weight loss.  HENT:  Negative for hearing loss, sinus pain and sore throat.   Respiratory:  Negative for cough and hemoptysis.   Cardiovascular:  Negative for chest pain, palpitations, leg swelling and PND.  Gastrointestinal:  Negative for abdominal pain, constipation, diarrhea, heartburn, nausea and vomiting.  Genitourinary:  Negative for dysuria, frequency and urgency.  Musculoskeletal:  Negative for back pain, myalgias and neck pain.  Skin:  Negative for itching and rash.  Endo/Heme/Allergies:  Negative for polydipsia.  Psychiatric/Behavioral:  Negative for depression. The patient is not nervous/anxious.     Objective:   BP 120/78 (BP Location: Left Arm, Patient Position: Sitting, Cuff Size: Large)   Pulse 89   Temp 97.7 F (36.5 C) (Temporal)   Ht '5\' 6"'$  (1.676 m)   Wt 205 lb 8 oz (93.2 kg)   SpO2 98%   BMI 33.17 kg/m  Body mass index is 33.17 kg/m.   General Appearance:    Alert, cooperative, no distress, appears stated age  Head:    Normocephalic, without obvious abnormality, atraumatic  Eyes:    PERRL, conjunctiva/corneas clear, EOM's intact, fundi    benign, both eyes  Ears:    Normal TM's and external ear canals, both ears  Nose:   Nares normal, septum midline, mucosa normal, no drainage    or sinus tenderness  Throat:   Lips, mucosa, and tongue normal; teeth and gums normal  Neck:   Supple, symmetrical, trachea midline, no adenopathy;    thyroid:  no enlargement/tenderness/nodules; no carotid   bruit or JVD  Back:     Symmetric,  no curvature, ROM normal, no CVA tenderness  Lungs:     Clear to auscultation bilaterally, respirations unlabored  Chest Wall:    No tenderness or deformity   Heart:    Regular rate and rhythm, S1 and S2 normal, no murmur, rub or gallop  Breast Exam:    Deferred  Abdomen:     Soft, non-tender, bowel sounds active all four quadrants,    no masses, no organomegaly  Genitalia:    Deferred  Extremities:   Extremities normal, atraumatic, no  cyanosis or edema  Pulses:   2+ and symmetric all extremities  Skin:   Skin color, texture, turgor normal, no rashes or lesions  Lymph nodes:   Cervical, supraclavicular, and axillary nodes normal  Neurologic:   CNII-XII intact, normal strength, sensation and reflexes    throughout    Assessment/Plan:   Routine physical examination Today patient counseled on age appropriate routine health concerns for screening and prevention, each reviewed and up to date or declined. Immunizations reviewed and up to date or declined. Labs ordered and reviewed. Risk factors for depression reviewed and negative. Hearing function and visual acuity are intact. ADLs screened and addressed as needed. Functional ability and level of safety reviewed and appropriate. Education, counseling and referrals performed based on assessed risks today. Patient provided with a copy of personalized plan for preventive services.  Obesity, unspecified classification, unspecified obesity type, unspecified whether serious comorbidity present Improving, has resumed Mounjaro Refill of zofran for side effect management Follow-up in 3-6 months, sooner if concerns  Encounter for screening for other viral diseases Update Hep C  Hypertension, unspecified type Well controlled Continue valsartan-hctz 80-12.5 mg daily Follow-up in 6 mo to 1 yr.  Need for immunization against influenza Completed today   I,Verona Buck,acting as a scribe for [employer], PA.,have documented all relevant  documentation on the behalf of Inda Coke, PA,as directed by  Inda Coke, PA while in the presence of Inda Coke, [place].  I, Inda Coke, [place], have reviewed all documentation for this visit. The documentation on 05/26/22 for the exam, diagnosis, procedures, and orders are all accurate and complete.  Inda Coke, PA-C [place]

## 2022-05-27 ENCOUNTER — Other Ambulatory Visit (HOSPITAL_COMMUNITY): Payer: Self-pay

## 2022-05-27 DIAGNOSIS — Z79899 Other long term (current) drug therapy: Secondary | ICD-10-CM | POA: Diagnosis not present

## 2022-05-27 DIAGNOSIS — F902 Attention-deficit hyperactivity disorder, combined type: Secondary | ICD-10-CM | POA: Diagnosis not present

## 2022-05-27 LAB — HEPATITIS C ANTIBODY: Hepatitis C Ab: NONREACTIVE

## 2022-05-27 MED ORDER — LISDEXAMFETAMINE DIMESYLATE 60 MG PO CHEW
1.0000 | CHEWABLE_TABLET | Freq: Every day | ORAL | 0 refills | Status: DC
  Filled 2022-06-20: qty 30, 30d supply, fill #0

## 2022-05-27 MED ORDER — LISDEXAMFETAMINE DIMESYLATE 60 MG PO CHEW
60.0000 mg | CHEWABLE_TABLET | Freq: Every day | ORAL | 0 refills | Status: DC
  Filled 2022-11-20: qty 30, 30d supply, fill #0

## 2022-05-27 MED ORDER — LISDEXAMFETAMINE DIMESYLATE 60 MG PO CHEW
60.0000 mg | CHEWABLE_TABLET | Freq: Every day | ORAL | 0 refills | Status: DC
  Filled 2022-07-21: qty 30, 30d supply, fill #0

## 2022-05-30 ENCOUNTER — Ambulatory Visit (INDEPENDENT_AMBULATORY_CARE_PROVIDER_SITE_OTHER): Payer: 59 | Admitting: Physical Therapy

## 2022-05-30 ENCOUNTER — Encounter: Payer: Self-pay | Admitting: Physical Therapy

## 2022-05-30 DIAGNOSIS — M542 Cervicalgia: Secondary | ICD-10-CM

## 2022-05-30 DIAGNOSIS — M25511 Pain in right shoulder: Secondary | ICD-10-CM

## 2022-05-30 DIAGNOSIS — M62838 Other muscle spasm: Secondary | ICD-10-CM | POA: Diagnosis not present

## 2022-05-30 NOTE — Therapy (Signed)
OUTPATIENT PHYSICAL THERAPY UPPER EXTREMITY TREATMENT    Patient Name: Crystal Madden MRN: [id number] DOB:1976/05/20, 46 y.o., female Today's Date: 05/30/2022   PT End of Session - 05/30/22 1246     Visit Number 2    Number of Visits 16    Date for PT Re-Evaluation 07/21/22    Authorization Type [facility]    PT Start Time 1151    PT Stop Time 1230    PT Time Calculation (min) 39 min    Activity Tolerance Patient tolerated treatment well    Behavior During Therapy WFL for tasks assessed/performed              Past Medical History:  Diagnosis Date   Anxiety    Depression    High blood pressure    Hyperlipidemia    Lower back pain    Obesity (BMI 30-39.9)    Past Surgical History:  Procedure Laterality Date   BLADDER SUSPENSION     CESAREAN SECTION     LUMBAR MICRODISCECTOMY  2018   [place] SURGERY     TONSILLECTOMY  2000   Patient Active Problem List   Diagnosis Date Noted   Cervical disc disorder with radiculopathy 03/09/2020   Insulin resistance 12/29/2019   Hypertension 06/14/2019   Vitamin D deficiency 08/28/2018   BMI 33.0-33.9,adult 08/27/2018   Attention deficit hyperactivity disorder (ADHD), combined type 08/27/2018   Diverticulitis of sigmoid colon 07/22/2016    PCP: Inda Coke  REFERRING PROVIDER: Lynne Leader   REFERRING DIAG: R shoulder, neck pain.   THERAPY DIAG:  Cervicalgia  Acute pain of right shoulder  Other muscle spasm  Rationale for Evaluation and Treatment Rehabilitation  ONSET DATE:   SUBJECTIVE:                                                                                                                                                                                      SUBJECTIVE STATEMENT: Pt with less tightness in UT. Still feels tightness back a little further.  Shoulder still not painful.    Eval: Pt was having R shoulder pain. Did get injection last week that has helped significantly. Still a bit sore.  She now notes mostly tightness in R UT region. She has had ongoing neck pain in past years, with tingling into R UE. 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    PERTINENT HISTORY: none   PAIN:  Are you having pain? Yes: NPRS scale: 5/10 Pain location: Tightness Bil neck R > L.  Pain description: tightness Aggravating factors: none stated Relieving factors:  none stated    PRECAUTIONS: None   WEIGHT BEARING RESTRICTIONS: No  FALLS:  Has patient fallen in last 6 months? No    PLOF: Independent  PATIENT GOALS: Decreased pain and tightness in neck.     OBJECTIVE:   DIAGNOSTIC FINDINGS:  MRI 01/13/22: neck IMPRESSION: 1. C3-4 central disc protrusion indenting the right ventral cord, new/progressed from 2021. Chronic right foraminal protrusion at this level which could affect the right C4 nerve root. 2. Mild to moderate left foraminal narrowing at C4-5   COGNITION: Overall cognitive status: Within functional limits for tasks assessed      POSTURE:   UPPER EXTREMITY ROM:   Shoulders: WFL  Neck: WFL  pain with full R rotation and R SB.   UPPER EXTREMITY MMT:   Shoulders: 4+/5     JOINT MOBILITY TESTING:    PALPATION:  Tightness in bil UT, levator, and into R cervical paraspinals.     TODAY'S TREATMENT:                                                                                                                                                    DATE:   05/30/2022 Manual: DTM/TPR to R UT and levator, manual cervical traction 10 sec x 10, manual UT and levator stretches cervical PA s Ther ex: Doorway stretch 30 sec x 3 bil; Rows Blue TB x 20; Bil shoulder ER RTB x 15, single arm IR/ER RTB x 15 ea on R; Wall angles x 15; Supine Horiz abd with gtb x 15;   Trigger Point Dry-Needling  Treatment instructions: Expect mild to moderate muscle soreness. S/S of pneumothorax if dry needled  over a lung field, and to seek immediate medical attention should they occur. Patient verbalized understanding of these instructions and education.  Patient Consent Given: Yes Education handout provided: Previously provided Muscles treated: R levator Electrical stimulation performed: No Parameters: N/A Treatment response/outcome: Twitch response, palpable increase in muscle length.     05/26/2022 Manual: STM to R UT and levator, manual cervical traction 10 sec x 10, manual UT and levator stretches.  Trigger Point Dry-Needling  Treatment instructions: Expect mild to moderate muscle soreness. S/S of pneumothorax if dry needled over a lung field, and to seek immediate medical attention should they occur. Patient verbalized understanding of these instructions and education.  Patient Consent Given: Yes Education handout provided: Previously provided Muscles treated: R UT and levator Electrical stimulation performed: No Parameters: N/A Treatment response/outcome: Twitch response, palpable increase in muscle length.      PATIENT EDUCATION: Education details: reviewed HEP Person educated: Patient Education method: Explanation, Demonstration, Tactile cues, Verbal cues, and Handouts Education comprehension: verbalized understanding, returned demonstration, verbal cues required, tactile cues required, and needs further education   HOME EXERCISE PROGRAM:     ASSESSMENT:  CLINICAL IMPRESSION: Education on postural  strengthening today, pt with good tolerance. Cueing for optimal form with shoulder rotation, pt able to perform without pain. Pt with good twitch response and release of levator, will continue to assess effects next visit.   Eval: Patient presents with primary complaint of increased pain in neck and R shoulder. R shoulder pain much improved since recent injection. She does have increased muscle tension and pain neck region, R>L. She also has mild pain with full cervical  rotation. She has decreased ability for full functional activity due to pain. Pt to benefit from skilled PT to improve deficits and pain.    OBJECTIVE IMPAIRMENTS: decreased activity tolerance, decreased mobility, increased fascial restrictions, increased muscle spasms, improper body mechanics, and pain.   ACTIVITY LIMITATIONS: carrying, lifting, and reach over head  PARTICIPATION LIMITATIONS: cleaning, laundry, shopping, community activity, and occupation  PERSONAL FACTORS:  none  are also affecting patient's functional outcome.   REHAB POTENTIAL: Good  CLINICAL DECISION MAKING: Stable/uncomplicated  EVALUATION COMPLEXITY: Low   GOALS: Goals reviewed with patient? Yes  SHORT TERM GOALS: Target date: 06/09/2022    Pt to be independent with initial HEP  Goal status: INITIAL  2.  Pt to report decreased pain and tightness in neck to 0-3/10   Goal status: INITIAL    LONG TERM GOALS: Target date: 07/21/2022    Pt to be independent with final HEP   Goal status: INITIAL  2.  Pt to report decreased pain in R neck and shoulder to 0-2/10 with activity   Goal status: INITIAL  3.  Pt to demo soft tissue limitations to be WNL, for decreased pain   Goal status: INITIAL  4.   Pt to demo full ROM for neck and shoulder, to be pain free, to improve ability for lift, reach, carry, and IADLs.   Goal status: INITIAL     PLAN: PT FREQUENCY: 1-2x/week  PT DURATION: 8 weeks  PLANNED INTERVENTIONS: Therapeutic exercises, Therapeutic activity, Neuromuscular re-education, Patient/Family education, Self Care, Joint mobilization, Joint manipulation, DME instructions, Dry Needling, Electrical stimulation, Spinal manipulation, Spinal mobilization, Cryotherapy, Moist heat, Taping, Traction, Ionotophoresis '4mg'$ /ml Dexamethasone, and Manual therapy  PLAN FOR NEXT SESSION:    Lyndee Hensen, PT, DPT 12:47 PM  05/30/22

## 2022-06-03 ENCOUNTER — Encounter: Payer: 59 | Admitting: Physical Therapy

## 2022-06-06 ENCOUNTER — Ambulatory Visit (INDEPENDENT_AMBULATORY_CARE_PROVIDER_SITE_OTHER): Payer: 59 | Admitting: Physical Therapy

## 2022-06-06 DIAGNOSIS — M542 Cervicalgia: Secondary | ICD-10-CM | POA: Diagnosis not present

## 2022-06-06 DIAGNOSIS — M62838 Other muscle spasm: Secondary | ICD-10-CM | POA: Diagnosis not present

## 2022-06-06 DIAGNOSIS — M25511 Pain in right shoulder: Secondary | ICD-10-CM

## 2022-06-06 NOTE — Therapy (Signed)
OUTPATIENT PHYSICAL THERAPY UPPER EXTREMITY TREATMENT    Patient Name: Crystal Madden MRN: [id number] DOB:[birth year], 46 y.o., female Today's Date: 06/06/2022      Past Medical History:  Diagnosis Date   Anxiety    Depression    High blood pressure    Hyperlipidemia    Lower back pain    Obesity (BMI 30-39.9)    Past Surgical History:  Procedure Laterality Date   BLADDER SUSPENSION     CESAREAN SECTION     LUMBAR MICRODISCECTOMY  2018   [place] SURGERY     TONSILLECTOMY  2000   Patient Active Problem List   Diagnosis Date Noted   Cervical disc disorder with radiculopathy 03/09/2020   Insulin resistance 12/29/2019   Hypertension 06/14/2019   Vitamin D deficiency 08/28/2018   BMI 33.0-33.9,adult 08/27/2018   Attention deficit hyperactivity disorder (ADHD), combined type 08/27/2018   Diverticulitis of sigmoid colon 07/22/2016    PCP: Inda Coke  REFERRING PROVIDER: Lynne Leader   REFERRING DIAG: R shoulder, neck pain.   THERAPY DIAG:  No diagnosis found.  Rationale for Evaluation and Treatment Rehabilitation  ONSET DATE:   SUBJECTIVE:                                                                                                                                                                                      SUBJECTIVE STATEMENT: Still feeling "tight" despite doing HEP, stretching, and using heat.    Eval: Pt was having R shoulder pain. Did get injection last week that has helped significantly. Still a bit sore. She now notes mostly tightness in R UT region. She has had ongoing neck pain in past years, with tingling into R UE. 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    PERTINENT HISTORY: none   PAIN:  Are you having pain? Yes: NPRS scale: 5/10 Pain location: Tightness Bil neck R > L.  Pain description: tightness Aggravating factors: none stated Relieving  factors: none stated    PRECAUTIONS: None   WEIGHT BEARING RESTRICTIONS: No  FALLS:  Has patient fallen in last 6 months? No    PLOF: Independent  PATIENT GOALS: Decreased pain and tightness in neck.     OBJECTIVE:   DIAGNOSTIC FINDINGS:  MRI 01/13/22: neck IMPRESSION: 1. C3-4 central disc protrusion indenting the right ventral cord, new/progressed from 2021. Chronic right foraminal protrusion at this level which could affect the right C4 nerve root. 2. Mild to moderate left foraminal narrowing at C4-5   COGNITION: Overall cognitive status: Within functional limits for tasks assessed  POSTURE:   UPPER EXTREMITY ROM:   Shoulders: WFL  Neck: WFL  pain with full R rotation and R SB.   UPPER EXTREMITY MMT:   Shoulders: 4+/5     JOINT MOBILITY TESTING:    PALPATION:  Tightness in bil UT, levator, and into R cervical paraspinals.     TODAY'S TREATMENT:                                                                                                                                                    DATE:     06/06/22: Manual: DTM/TPR to R UT and levator, manual cervical traction 10 sec x 10, manual UT and levator stretches cervical PA s  Ther ex: Doorway stretch 30 sec x 3 bil;  Rows Blue TB x 20; Bil shoulder ER RTB x 15, single arm IR/ER RTB x 15 ea on R; Wall angles x 15; Standing Horiz abd with gtb x 15;     05/30/2022 Manual: DTM/TPR to R UT and levator, manual cervical traction 10 sec x 10, manual UT and levator stretches cervical PA s Ther ex: Doorway stretch 30 sec x 3 bil; Rows Blue TB x 20; Bil shoulder ER RTB x 15, single arm IR/ER RTB x 15 ea on R; Wall angles x 15; Supine Horiz abd with gtb x 15;   Trigger Point Dry-Needling  Treatment instructions: Expect mild to moderate muscle soreness. S/S of pneumothorax if dry needled over a lung field, and to seek immediate medical attention should they occur. Patient verbalized understanding of these  instructions and education.  Patient Consent Given: Yes Education handout provided: Previously provided Muscles treated: R levator Electrical stimulation performed: No Parameters: N/A Treatment response/outcome: Twitch response, palpable increase in muscle length.     05/26/2022 Manual: STM to R UT and levator, manual cervical traction 10 sec x 10, manual UT and levator stretches.  Trigger Point Dry-Needling  Treatment instructions: Expect mild to moderate muscle soreness. S/S of pneumothorax if dry needled over a lung field, and to seek immediate medical attention should they occur. Patient verbalized understanding of these instructions and education.  Patient Consent Given: Yes Education handout provided: Previously provided Muscles treated: R UT and levator Electrical stimulation performed: No Parameters: N/A Treatment response/outcome: Twitch response, palpable increase in muscle length.      PATIENT EDUCATION: Education details: reviewed HEP Person educated: Patient Education method: Explanation, Demonstration, Tactile cues, Verbal cues, and Handouts Education comprehension: verbalized understanding, returned demonstration, verbal cues required, tactile cues required, and needs further education   HOME EXERCISE PROGRAM:     ASSESSMENT:  CLINICAL IMPRESSION: Education on postural strengthening today, pt with good tolerance. Cueing for optimal form with shoulder rotation, pt able to perform without pain. Pt with good twitch response and release of levator, will continue to assess effects next visit.  Eval: Patient presents with primary complaint of increased pain in neck and R shoulder. R shoulder pain much improved since recent injection. She does have increased muscle tension and pain neck region, R>L. She also has mild pain with full cervical rotation. She has decreased ability for full functional activity due to pain. Pt to benefit from skilled PT to improve  deficits and pain.    OBJECTIVE IMPAIRMENTS: decreased activity tolerance, decreased mobility, increased fascial restrictions, increased muscle spasms, improper body mechanics, and pain.   ACTIVITY LIMITATIONS: carrying, lifting, and reach over head  PARTICIPATION LIMITATIONS: cleaning, laundry, shopping, community activity, and occupation  PERSONAL FACTORS:  none  are also affecting patient's functional outcome.   REHAB POTENTIAL: Good  CLINICAL DECISION MAKING: Stable/uncomplicated  EVALUATION COMPLEXITY: Low   GOALS: Goals reviewed with patient? Yes  SHORT TERM GOALS: Target date: 06/09/2022    Pt to be independent with initial HEP  Goal status: INITIAL  2.  Pt to report decreased pain and tightness in neck to 0-3/10   Goal status: INITIAL    LONG TERM GOALS: Target date: 07/21/2022    Pt to be independent with final HEP   Goal status: INITIAL  2.  Pt to report decreased pain in R neck and shoulder to 0-2/10 with activity   Goal status: INITIAL  3.  Pt to demo soft tissue limitations to be WNL, for decreased pain   Goal status: INITIAL  4.   Pt to demo full ROM for neck and shoulder, to be pain free, to improve ability for lift, reach, carry, and IADLs.   Goal status: INITIAL     PLAN: PT FREQUENCY: 1-2x/week  PT DURATION: 8 weeks  PLANNED INTERVENTIONS: Therapeutic exercises, Therapeutic activity, Neuromuscular re-education, Patient/Family education, Self Care, Joint mobilization, Joint manipulation, DME instructions, Dry Needling, Electrical stimulation, Spinal manipulation, Spinal mobilization, Cryotherapy, Moist heat, Taping, Traction, Ionotophoresis '4mg'$ /ml Dexamethasone, and Manual therapy  PLAN FOR NEXT SESSION:    Lyndee Hensen, PT, DPT 11:44 AM  06/06/22

## 2022-06-09 ENCOUNTER — Ambulatory Visit (INDEPENDENT_AMBULATORY_CARE_PROVIDER_SITE_OTHER): Payer: 59 | Admitting: Physical Therapy

## 2022-06-09 ENCOUNTER — Encounter: Payer: Self-pay | Admitting: Physical Therapy

## 2022-06-09 DIAGNOSIS — M542 Cervicalgia: Secondary | ICD-10-CM | POA: Diagnosis not present

## 2022-06-09 DIAGNOSIS — M62838 Other muscle spasm: Secondary | ICD-10-CM

## 2022-06-09 DIAGNOSIS — M25511 Pain in right shoulder: Secondary | ICD-10-CM

## 2022-06-09 NOTE — Therapy (Addendum)
OUTPATIENT PHYSICAL THERAPY UPPER EXTREMITY TREATMENT    Patient Name: Crystal Madden MRN: [id number] DOB:[birth year], 46 y.o., female Today's Date: 06/09/2022   PT End of Session - 06/09/22 1528     Visit Number 4    Number of Visits 16    Date for PT Re-Evaluation 07/21/22    Authorization Type [facility]    PT Start Time 1220    PT Stop Time 1300    PT Time Calculation (min) 40 min    Activity Tolerance Patient tolerated treatment well    Behavior During Therapy WFL for tasks assessed/performed               Past Medical History:  Diagnosis Date   Anxiety    Depression    High blood pressure    Hyperlipidemia    Lower back pain    Obesity (BMI 30-39.9)    Past Surgical History:  Procedure Laterality Date   BLADDER SUSPENSION     CESAREAN SECTION     LUMBAR MICRODISCECTOMY  2018   [place] SURGERY     TONSILLECTOMY  2000   Patient Active Problem List   Diagnosis Date Noted   Cervical disc disorder with radiculopathy 03/09/2020   Insulin resistance 12/29/2019   Hypertension 06/14/2019   Vitamin D deficiency 08/28/2018   BMI 33.0-33.9,adult 08/27/2018   Attention deficit hyperactivity disorder (ADHD), combined type 08/27/2018   Diverticulitis of sigmoid colon 07/22/2016    PCP: Inda Coke  REFERRING PROVIDER: Lynne Leader   REFERRING DIAG: R shoulder, neck pain.   THERAPY DIAG:  Cervicalgia  Acute pain of right shoulder  Other muscle spasm  Rationale for Evaluation and Treatment Rehabilitation  ONSET DATE:   SUBJECTIVE:                                                                                                                                                                                      SUBJECTIVE STATEMENT: Pt reports less pain, and less tightness. L levator area has been better.    Eval: Pt was having R shoulder pain. Did get injection last week that has helped significantly. Still a bit sore. She now notes mostly  tightness in R UT region. She has had ongoing neck pain in past years, with tingling into R UE. 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    PERTINENT HISTORY: none   PAIN:  Are you having pain? Yes: NPRS scale: 5/10 Pain location: Tightness Bil neck R > L.  Pain description: tightness Aggravating factors: none stated Relieving factors: none stated  PRECAUTIONS: None   WEIGHT BEARING RESTRICTIONS: No  FALLS:  Has patient fallen in last 6 months? No    PLOF: Independent  PATIENT GOALS: Decreased pain and tightness in neck.     OBJECTIVE:   DIAGNOSTIC FINDINGS:  MRI 01/13/22: neck IMPRESSION: 1. C3-4 central disc protrusion indenting the right ventral cord, new/progressed from 2021. Chronic right foraminal protrusion at this level which could affect the right C4 nerve root. 2. Mild to moderate left foraminal narrowing at C4-5   COGNITION: Overall cognitive status: Within functional limits for tasks assessed      POSTURE:   UPPER EXTREMITY ROM:   Shoulders: WFL  Neck: WFL  pain with full R rotation and R SB.   UPPER EXTREMITY MMT:   Shoulders: 4+/5     JOINT MOBILITY TESTING:    PALPATION:  Tightness in bil UT, levator, and into R cervical paraspinals.     TODAY'S TREATMENT:                                                                                                                                                    DATE:     06/09/22: Manual:  manual cervical traction ;  manual UT and levator stretches ; cervical PA s, side glides, STM/DTM to bil cervical paraspinals, SOR.   Ther ex: Doorway stretch 30 sec x 3 bil;  Rows Blue TB x 20;  single arm IR/ER RTB x 15 ea on R;   Wall angles x 15;  Standing Horiz abd with gtb x 15; Quadruped SA presses x 20;  UE lifts x 10 bil;     05/30/2022 Manual: DTM/TPR to R UT and levator, manual cervical traction 10 sec x 10, manual  UT and levator stretches cervical PA s Ther ex: Doorway stretch 30 sec x 3 bil; Rows Blue TB x 20; Bil shoulder ER RTB x 15, single arm IR/ER RTB x 15 ea on R; Wall angles x 15; Supine Horiz abd with gtb x 15;   Trigger Point Dry-Needling  Treatment instructions: Expect mild to moderate muscle soreness. S/S of pneumothorax if dry needled over a lung field, and to seek immediate medical attention should they occur. Patient verbalized understanding of these instructions and education.  Patient Consent Given: Yes Education handout provided: Previously provided Muscles treated: R levator Electrical stimulation performed: No Parameters: N/A Treatment response/outcome: Twitch response, palpable increase in muscle length.     05/26/2022 Manual: STM to R UT and levator, manual cervical traction 10 sec x 10, manual UT and levator stretches.  Trigger Point Dry-Needling  Treatment instructions: Expect mild to moderate muscle soreness. S/S of pneumothorax if dry needled over a lung field, and to seek immediate medical attention should they occur. Patient verbalized understanding of these instructions and education.  Patient Consent Given: Yes Education handout provided: Previously  provided Muscles treated: R UT and levator Electrical stimulation performed: No Parameters: N/A Treatment response/outcome: Twitch response, palpable increase in muscle length.      PATIENT EDUCATION: Education details: reviewed HEP Person educated: Patient Education method: Explanation, Demonstration, Tactile cues, Verbal cues, and Handouts Education comprehension: verbalized understanding, returned demonstration, verbal cues required, tactile cues required, and needs further education   HOME EXERCISE PROGRAM:     ASSESSMENT:  CLINICAL IMPRESSION: Pt with improving ability and tolerance for ther ex and strengthening. Pt with R shoulder noted to be higher in standing today. Posteriorly, R hip is higher. In  supine, L LE is shorter, with + long sit test. Pt does have pain in L low lumbar/SI region. Discussed option for manipulation that may be helpful for pelvic rotation, Also educated on pelvic stab exercise to re-align. Pt states understanding. Does have less pain and tension in neck this week. Plan to progress as tolerated.   Eval: Patient presents with primary complaint of increased pain in neck and R shoulder. R shoulder pain much improved since recent injection. She does have increased muscle tension and pain neck region, R>L. She also has mild pain with full cervical rotation. She has decreased ability for full functional activity due to pain. Pt to benefit from skilled PT to improve deficits and pain.    OBJECTIVE IMPAIRMENTS: decreased activity tolerance, decreased mobility, increased fascial restrictions, increased muscle spasms, improper body mechanics, and pain.   ACTIVITY LIMITATIONS: carrying, lifting, and reach over head  PARTICIPATION LIMITATIONS: cleaning, laundry, shopping, community activity, and occupation  PERSONAL FACTORS:  none  are also affecting patient's functional outcome.   REHAB POTENTIAL: Good  CLINICAL DECISION MAKING: Stable/uncomplicated  EVALUATION COMPLEXITY: Low   GOALS: Goals reviewed with patient? Yes  SHORT TERM GOALS: Target date: 06/09/2022    Pt to be independent with initial HEP  Goal status: INITIAL  2.  Pt to report decreased pain and tightness in neck to 0-3/10   Goal status: INITIAL    LONG TERM GOALS: Target date: 07/21/2022    Pt to be independent with final HEP   Goal status: INITIAL  2.  Pt to report decreased pain in R neck and shoulder to 0-2/10 with activity   Goal status: INITIAL  3.  Pt to demo soft tissue limitations to be WNL, for decreased pain   Goal status: INITIAL  4.   Pt to demo full ROM for neck and shoulder, to be pain free, to improve ability for lift, reach, carry, and IADLs.   Goal status:  INITIAL     PLAN: PT FREQUENCY: 1-2x/week  PT DURATION: 8 weeks  PLANNED INTERVENTIONS: Therapeutic exercises, Therapeutic activity, Neuromuscular re-education, Patient/Family education, Self Care, Joint mobilization, Joint manipulation, DME instructions, Dry Needling, Electrical stimulation, Spinal manipulation, Spinal mobilization, Cryotherapy, Moist heat, Taping, Traction, Ionotophoresis '4mg'$ /ml Dexamethasone, and Manual therapy  PLAN FOR NEXT SESSION:    Lyndee Hensen, PT, DPT 3:29 PM  06/09/22  PHYSICAL THERAPY DISCHARGE SUMMARY  Visits from Start of Care: 4 Plan: Patient agrees to discharge.  Patient goals were partialy met. Patient is being discharged due to  - Pt did not return after last visit.      Lyndee Hensen, PT, DPT 2:04 PM  10/09/22

## 2022-06-10 ENCOUNTER — Other Ambulatory Visit: Payer: Self-pay | Admitting: Physician Assistant

## 2022-06-10 ENCOUNTER — Other Ambulatory Visit (HOSPITAL_COMMUNITY): Payer: Self-pay

## 2022-06-10 MED ORDER — MOUNJARO 12.5 MG/0.5ML ~~LOC~~ SOAJ
12.5000 mg | SUBCUTANEOUS | 0 refills | Status: DC
  Filled 2022-06-10: qty 2, 28d supply, fill #0

## 2022-06-11 ENCOUNTER — Encounter: Payer: 59 | Admitting: Physical Therapy

## 2022-06-15 ENCOUNTER — Encounter: Payer: Self-pay | Admitting: Family Medicine

## 2022-06-15 DIAGNOSIS — M5416 Radiculopathy, lumbar region: Secondary | ICD-10-CM

## 2022-06-20 ENCOUNTER — Other Ambulatory Visit (HOSPITAL_COMMUNITY): Payer: Self-pay

## 2022-06-24 ENCOUNTER — Ambulatory Visit
Admission: RE | Admit: 2022-06-24 | Discharge: 2022-06-24 | Disposition: A | Discharge status: Home / Self Care | Payer: 59 | Source: Ambulatory Visit | Attending: Family Medicine | Admitting: Family Medicine

## 2022-06-24 DIAGNOSIS — M47817 Spondylosis without myelopathy or radiculopathy, lumbosacral region: Secondary | ICD-10-CM | POA: Diagnosis not present

## 2022-06-24 DIAGNOSIS — M5416 Radiculopathy, lumbar region: Secondary | ICD-10-CM

## 2022-06-24 MED ORDER — IOPAMIDOL (ISOVUE-M 200) INJECTION 41%
1.0000 mL | Freq: Once | INTRAMUSCULAR | Status: AC
  Administered 2022-06-24: 1 mL via EPIDURAL

## 2022-06-24 MED ORDER — METHYLPREDNISOLONE ACETATE 40 MG/ML INJ SUSP (RADIOLOG
80.0000 mg | Freq: Once | INTRAMUSCULAR | Status: AC
  Administered 2022-06-24: 80 mg via EPIDURAL

## 2022-06-24 NOTE — Discharge Instructions (Signed)

## 2022-06-30 NOTE — Progress Notes (Deleted)
   I, Peterson Lombard, LAT, ATC acting as a scribe for Lynne Leader, MD.  Crystal Madden is a 46 y.o. female who presents to [facility] today for f/u R shoulder pain and cervical radiculopathy. Pt was last seen by Dr. Georgina Snell on 05/23/22 and was given a R subacromial steroid injection and was referred to PT, completing 4 visits. Pt also sent a MyChart message reporting a flare of her lumbar radiculopathy and a repeat ESI was ordered. Today, pt reports   Dx imaging: 01/13/2022 L-spine & c-spine MRI 12/30/2021 L-spine & c-spine x-ray  Pertinent review of systems: ***  Relevant historical information: ***   Exam:  There were no vitals taken for this visit. General: Well Developed, well nourished, and in no acute distress.   MSK: ***    Lab and Radiology Results No results found for this or any previous visit (from the past 72 hour(s)). No results found.     Assessment and Plan: 46 y.o. female with ***   PDMP not reviewed this encounter. No orders of the defined types were placed in this encounter.  No orders of the defined types were placed in this encounter.    Discussed warning signs or symptoms. Please see discharge instructions. Patient expresses understanding.   ***

## 2022-07-01 ENCOUNTER — Ambulatory Visit: Payer: 59 | Admitting: Family Medicine

## 2022-07-17 ENCOUNTER — Other Ambulatory Visit: Payer: Self-pay | Admitting: Physician Assistant

## 2022-07-18 ENCOUNTER — Other Ambulatory Visit (HOSPITAL_COMMUNITY): Payer: Self-pay

## 2022-07-18 MED ORDER — MOUNJARO 12.5 MG/0.5ML ~~LOC~~ SOAJ
12.5000 mg | SUBCUTANEOUS | 0 refills | Status: DC
  Filled 2022-07-18 – 2022-08-01 (×2): qty 2, 28d supply, fill #0

## 2022-07-18 MED ORDER — ONDANSETRON HCL 4 MG PO TABS
4.0000 mg | ORAL_TABLET | Freq: Three times a day (TID) | ORAL | 0 refills | Status: DC | PRN
  Filled 2022-07-18: qty 20, 7d supply, fill #0

## 2022-07-19 ENCOUNTER — Other Ambulatory Visit (HOSPITAL_COMMUNITY): Payer: Self-pay

## 2022-07-21 ENCOUNTER — Other Ambulatory Visit (HOSPITAL_COMMUNITY): Payer: Self-pay

## 2022-07-22 ENCOUNTER — Other Ambulatory Visit (HOSPITAL_COMMUNITY): Payer: Self-pay

## 2022-07-26 ENCOUNTER — Other Ambulatory Visit (HOSPITAL_COMMUNITY): Payer: Self-pay

## 2022-08-01 ENCOUNTER — Other Ambulatory Visit (HOSPITAL_COMMUNITY): Payer: Self-pay

## 2022-08-18 DIAGNOSIS — L814 Other melanin hyperpigmentation: Secondary | ICD-10-CM | POA: Diagnosis not present

## 2022-08-18 DIAGNOSIS — L820 Inflamed seborrheic keratosis: Secondary | ICD-10-CM | POA: Diagnosis not present

## 2022-08-18 DIAGNOSIS — L570 Actinic keratosis: Secondary | ICD-10-CM | POA: Diagnosis not present

## 2022-08-18 DIAGNOSIS — L821 Other seborrheic keratosis: Secondary | ICD-10-CM | POA: Diagnosis not present

## 2022-08-18 DIAGNOSIS — D225 Melanocytic nevi of trunk: Secondary | ICD-10-CM | POA: Diagnosis not present

## 2022-08-18 DIAGNOSIS — C44529 Squamous cell carcinoma of skin of other part of trunk: Secondary | ICD-10-CM | POA: Diagnosis not present

## 2022-08-18 DIAGNOSIS — Z85828 Personal history of other malignant neoplasm of skin: Secondary | ICD-10-CM | POA: Diagnosis not present

## 2022-08-20 ENCOUNTER — Other Ambulatory Visit (HOSPITAL_COMMUNITY): Payer: Self-pay

## 2022-08-20 ENCOUNTER — Other Ambulatory Visit: Payer: Self-pay | Admitting: Physician Assistant

## 2022-08-20 MED ORDER — VALSARTAN-HYDROCHLOROTHIAZIDE 80-12.5 MG PO TABS
1.0000 | ORAL_TABLET | Freq: Every day | ORAL | 1 refills | Status: DC
  Filled 2022-08-20: qty 90, 90d supply, fill #0
  Filled 2022-11-20: qty 90, 90d supply, fill #1

## 2022-08-21 ENCOUNTER — Other Ambulatory Visit (HOSPITAL_COMMUNITY): Payer: Self-pay

## 2022-08-23 ENCOUNTER — Telehealth: Payer: Commercial Managed Care - PPO | Admitting: Nurse Practitioner

## 2022-08-23 ENCOUNTER — Other Ambulatory Visit (HOSPITAL_COMMUNITY): Payer: Self-pay

## 2022-08-23 DIAGNOSIS — K047 Periapical abscess without sinus: Secondary | ICD-10-CM | POA: Diagnosis not present

## 2022-08-23 MED ORDER — LISDEXAMFETAMINE DIMESYLATE 60 MG PO CHEW
60.0000 mg | CHEWABLE_TABLET | Freq: Every day | ORAL | 0 refills | Status: DC
  Filled 2022-08-23: qty 30, 30d supply, fill #0

## 2022-08-23 MED ORDER — NAPROXEN 500 MG PO TABS: 500.0000 mg | ORAL_TABLET | Freq: Two times a day (BID) | ORAL | 0 refills | Status: DC

## 2022-08-23 MED ORDER — AMOXICILLIN-POT CLAVULANATE 875-125 MG PO TABS: 1.0000 | ORAL_TABLET | Freq: Two times a day (BID) | ORAL | 0 refills | Status: AC

## 2022-08-23 NOTE — Progress Notes (Signed)
E-Visit for Dental Pain  We are sorry that you are not feeling well.  Here is how we plan to help!  Based on what you have shared with me in the questionnaire, it sounds like you have an infection in one of your teeth.   Augmentin 875-'125mg'$  twice a day for 7 days and Naprosyn '500mg'$  2 times a day for 7 days for discomfort  It is imperative that you see a dentist within 10 days of this eVisit to determine the cause of the dental pain and be sure it is adequately treated  A toothache or tooth pain is caused when the nerve in the root of a tooth or surrounding a tooth is irritated. Dental (tooth) infection, decay, injury, or loss of a tooth are the most common causes of dental pain. Pain may also occur after an extraction (tooth is pulled out). Pain sometimes originates from other areas and radiates to the jaw, thus appearing to be tooth pain.Bacteria growing inside your mouth can contribute to gum disease and dental decay, both of which can cause pain. A toothache occurs from inflammation of the central portion of the tooth called pulp. The pulp contains nerve endings that are very sensitive to pain. Inflammation to the pulp or pulpitis may be caused by dental cavities, trauma, and infection.    HOME CARE:   For toothaches: Over-the-counter pain medications such as acetaminophen or ibuprofen may be used. Take these as directed on the package while you arrange for a dental appointment. Avoid very cold or hot foods, because they may make the pain worse. You may get relief from biting on a cotton ball soaked in oil of cloves. You can get oil of cloves at most drug stores.  For jaw pain:  Aspirin may be helpful for problems in the joint of the jaw in adults. If pain happens every time you open your mouth widely, the temporomandibular joint (TMJ) may be the source of the pain. Yawning or taking a large bite of food may worsen the pain. An appointment with your doctor or dentist will help you find the  cause.     GET HELP RIGHT AWAY IF:  You have a high fever or chills If you have had a recent head or face injury and develop headache, light headedness, nausea, vomiting, or other symptoms that concern you after an injury to your face or mouth, you could have a more serious injury in addition to your dental injury. A facial rash associated with a toothache: This condition may improve with medication. Contact your doctor for them to decide what is appropriate. Any jaw pain occurring with chest pain: Although jaw pain is most commonly caused by dental disease, it is sometimes referred pain from other areas. People with heart disease, especially people who have had stents placed, people with diabetes, or those who have had heart surgery may have jaw pain as a symptom of heart attack or angina. If your jaw or tooth pain is associated with lightheadedness, sweating, or shortness of breath, you should see a doctor as soon as possible. Trouble swallowing or excessive pain or bleeding from gums: If you have a history of a weakened immune system, diabetes, or steroid use, you may be more susceptible to infections. Infections can often be more severe and extensive or caused by unusual organisms. Dental and gum infections in people with these conditions may require more aggressive treatment. An abscess may need draining or IV antibiotics, for example.  MAKE SURE YOU  Understand these instructions. Will watch your condition. Will get help right away if you are not doing well or get worse.  Thank you for choosing an e-visit.  Your e-visit answers were reviewed by a board certified advanced clinical practitioner to complete your personal care plan. Depending upon the condition, your plan could have included both over the counter or prescription medications.  Please review your pharmacy choice. Make sure the pharmacy is open so you can pick up prescription now. If there is a problem, you may contact your  provider through [employer] and have the prescription routed to another pharmacy.  Your safety is important to [place]. If you have drug allergies check your prescription carefully.   For the next 24 hours you can use MyChart to ask questions about today's visit, request a non-urgent call back, or ask for a work or school excuse. You will get an email in the next two days asking about your experience. I hope that your e-visit has been valuable and will speed your recovery.   Meds ordered this encounter  Medications   amoxicillin-clavulanate (AUGMENTIN) 875-125 MG tablet    Sig: Take 1 tablet by mouth 2 (two) times daily for 7 days. Take with food    Dispense:  14 tablet    Refill:  0   naproxen (NAPROSYN) 500 MG tablet    Sig: Take 1 tablet (500 mg total) by mouth 2 (two) times daily with a meal.    Dispense:  30 tablet    Refill:  0    I spent approximately 5 minutes reviewing the patient's history, current symptoms and coordinating their care today.

## 2022-08-24 ENCOUNTER — Other Ambulatory Visit: Payer: Self-pay

## 2022-08-24 ENCOUNTER — Emergency Department (HOSPITAL_BASED_OUTPATIENT_CLINIC_OR_DEPARTMENT_OTHER)
Admission: EM | Admit: 2022-08-24 | Discharge: 2022-08-24 | Disposition: A | Discharge status: Home / Self Care | Payer: Commercial Managed Care - PPO | Attending: Emergency Medicine | Admitting: Emergency Medicine

## 2022-08-24 ENCOUNTER — Encounter (HOSPITAL_BASED_OUTPATIENT_CLINIC_OR_DEPARTMENT_OTHER): Payer: Self-pay

## 2022-08-24 DIAGNOSIS — K0889 Other specified disorders of teeth and supporting structures: Secondary | ICD-10-CM | POA: Diagnosis not present

## 2022-08-24 DIAGNOSIS — K029 Dental caries, unspecified: Secondary | ICD-10-CM | POA: Insufficient documentation

## 2022-08-24 MED ORDER — HYDROCODONE-ACETAMINOPHEN 5-325 MG PO TABS: 2.0000 | ORAL_TABLET | ORAL | 0 refills | Status: DC | PRN

## 2022-08-24 MED ORDER — KETOROLAC TROMETHAMINE 15 MG/ML IJ SOLN
15.0000 mg | Freq: Once | INTRAMUSCULAR | Status: AC
  Administered 2022-08-24: 15 mg via INTRAMUSCULAR
  Filled 2022-08-24: qty 1

## 2022-08-24 NOTE — ED Triage Notes (Signed)
POV from home, amb, A&O x 4.  C/o dental pain x couple days, hx of dental caries and root canals. Pt sts that pain has started to radiate to cheek and jaw now. E-visit yesterday and began abx but pain worsened. NAD.

## 2022-08-24 NOTE — Discharge Instructions (Addendum)
Your exam today is overall reassuring.  You received a dose of Toradol in the emergency department.  I have sent short course of pain medication into your pharmacy.  For any concerning symptoms return to the emergency department.  Otherwise please call and schedule a follow-up appointment with your dentist.

## 2022-08-24 NOTE — ED Provider Notes (Signed)
[facility] EMERGENCY DEPT Provider Note   CSN: [id number] Arrival date & time: 08/24/22  1914     History  Chief Complaint  Patient presents with   Dental Pain    Crystal Madden is a 47 y.o. female.  47 year old female presents today for evaluation of left-sided dental pain that has been ongoing for the past few days.  She states she has been unable to get in with her dentist.  She did have an E visit and was prescribed Augmentin, naproxen yesterday.  She has gotten 1 dose of Augmentin and naproxen in her system.  Denies fever, inability to swallow, trismus.  States pain has been severe that she has become tearful.  She states she should be able to get in with her dentist in the next few days but has not been able to so far because of the weekend.  The history is provided by the patient and medical records. No language interpreter was used.       Home Medications Prior to Admission medications   Medication Sig Start Date End Date Taking? Authorizing Provider  ALPRAZolam (XANAX) 0.5 MG tablet TAKE 1 TABLET BY MOUTH IN THE AM AND 1/2 TABLET IN THE AFTERNOON Patient taking differently: Take 0.5 mg by mouth daily as needed for anxiety. 05/27/19   Briscoe Deutscher, DO  amoxicillin-clavulanate (AUGMENTIN) 875-125 MG tablet Take 1 tablet by mouth 2 (two) times daily for 7 days. Take with food 08/23/22 08/30/22  Apolonio Schneiders, FNP  bimatoprost (LATISSE) 0.03 % ophthalmic solution PLACE 1 DROP INTO BOTH EYES SEE ADMIN INSTRUCTIONS. PLACE 1 DROP ONTO APPLICATOR & APPLY EVENLY ALONG SKIN OF UPPER LID AT BASE OF LASHES TO BOTH EYES AT BEDTIME 03/11/22   Inda Coke, PA  Drospirenone (SLYND) 4 MG TABS Take 1 tablet by mouth daily. 10/30/21     Lisdexamfetamine Dimesylate (VYVANSE) 60 MG CHEW Chew 1 tablet (60 mg) by mouth daily. 04/11/22     Lisdexamfetamine Dimesylate (VYVANSE) 60 MG CHEW Chew 1 tablet by mouth daily. 06/20/22     Lisdexamfetamine Dimesylate (VYVANSE) 60 MG  CHEW Chew 1 tablet by mouth daily. 07/20/22     Lisdexamfetamine Dimesylate (VYVANSE) 60 MG CHEW Chew 60 mg by mouth daily. 08/22/22     naproxen (NAPROSYN) 500 MG tablet Take 1 tablet (500 mg total) by mouth 2 (two) times daily with a meal. 08/23/22   Apolonio Schneiders, FNP  ondansetron (ZOFRAN) 4 MG tablet Take 1 tablet (4 mg total) by mouth every 8 (eight) hours as needed for nausea or vomiting. 07/18/22   Inda Coke, PA  tirzepatide [facility]) 12.5 MG/0.5ML Pen Inject 12.5 mg into the skin once a week. 07/18/22   Inda Coke, PA  valsartan-hydrochlorothiazide (DIOVAN-HCT) 80-12.5 MG tablet Take 1 tablet by mouth daily. 08/20/22   Inda Coke, PA      Allergies    Amlodipine    Review of Systems   Review of Systems  Constitutional:  Negative for chills and fever.  HENT:  Positive for dental problem. Negative for drooling, sore throat, trouble swallowing and voice change.   Neurological:  Negative for light-headedness.  All other systems reviewed and are negative.   Physical Exam Updated Vital Signs BP (!) 157/100   Pulse (!) 105   Temp 98 F (36.7 C) (Oral)   Resp 18   Ht '5\' 6"'$  (1.676 m)   Wt 88.9 kg   LMP  (LMP Unknown)   SpO2 100%   BMI 31.64 kg/m  Physical  Exam Vitals and nursing note reviewed.  Constitutional:      General: She is not in acute distress.    Appearance: Normal appearance. She is not ill-appearing.  HENT:     Head: Normocephalic and atraumatic.     Nose: Nose normal.     Mouth/Throat:     Mouth: Mucous membranes are moist.     Dentition: Abnormal dentition. No dental caries or dental abscesses.     Pharynx: No oropharyngeal exudate or posterior oropharyngeal erythema.     Comments: Without evidence of retropharyngeal abscess, peritonsillar abscess, Ludwig's angina, without trismus.  Dental caries noted.  Without evidence of dental abscess. Eyes:     Conjunctiva/sclera: Conjunctivae normal.  Pulmonary:     Effort: Pulmonary effort is normal.  No respiratory distress.  Musculoskeletal:        General: No deformity.  Skin:    Findings: No rash.  Neurological:     Mental Status: She is alert.     ED Results / Procedures / Treatments   Labs (all labs ordered are listed, but only abnormal results are displayed) Labs Reviewed - No data to display  EKG None  Radiology No results found.  Procedures Procedures    Medications Ordered in ED Medications - No data to display  ED Course/ Medical Decision Making/ A&P                             Medical Decision Making  47 year old female presents today for evaluation of dental pain.  Was prescribed antibiotic and naproxen yesterday.  States she is not had significant control of her pain.  Does have a dentist and is planning to follow-up with them in the next couple days.  She is without trismus, evidence of dental abscess, retropharyngeal abscess, Ludwig's angina.  Will give dose of Toradol in the emergency department.  Will give short course of pain medication to keep on hand for severe breakthrough pain until she can follow-up with her dentist.  Patient is otherwise appropriate for discharge.  Discharged in stable condition.  Return precaution discussed.  Patient voices understanding and is in agreement with plan.   Final Clinical Impression(s) / ED Diagnoses Final diagnoses:  Pain, dental    Rx / DC Orders ED Discharge Orders          Ordered    HYDROcodone-acetaminophen (NORCO/VICODIN) 5-325 MG tablet  Every 4 hours PRN        08/24/22 2033              Evlyn Courier, PA-C 08/24/22 2034    Tretha Sciara, MD 08/25/22 2159

## 2022-08-25 ENCOUNTER — Other Ambulatory Visit (HOSPITAL_COMMUNITY): Payer: Self-pay

## 2022-08-26 ENCOUNTER — Other Ambulatory Visit (HOSPITAL_COMMUNITY): Payer: Self-pay

## 2022-08-26 DIAGNOSIS — Z79899 Other long term (current) drug therapy: Secondary | ICD-10-CM | POA: Diagnosis not present

## 2022-08-26 DIAGNOSIS — F902 Attention-deficit hyperactivity disorder, combined type: Secondary | ICD-10-CM | POA: Diagnosis not present

## 2022-08-26 MED ORDER — LISDEXAMFETAMINE DIMESYLATE 60 MG PO CHEW
60.0000 mg | CHEWABLE_TABLET | Freq: Every day | ORAL | 0 refills | Status: DC
  Filled 2023-01-19: qty 30, 30d supply, fill #0

## 2022-08-26 MED ORDER — LISDEXAMFETAMINE DIMESYLATE 60 MG PO CHEW
60.0000 mg | CHEWABLE_TABLET | Freq: Every day | ORAL | 0 refills | Status: DC
  Filled 2022-12-19: qty 30, 30d supply, fill #0

## 2022-08-26 MED ORDER — CLINDAMYCIN HCL 150 MG PO CAPS
300.0000 mg | ORAL_CAPSULE | Freq: Four times a day (QID) | ORAL | 0 refills | Status: DC
  Filled 2022-08-26: qty 56, 7d supply, fill #0

## 2022-08-26 MED ORDER — LISDEXAMFETAMINE DIMESYLATE 60 MG PO CHEW
60.0000 mg | CHEWABLE_TABLET | Freq: Every day | ORAL | 0 refills | Status: DC
  Filled 2022-09-25: qty 30, 30d supply, fill #0

## 2022-09-10 ENCOUNTER — Other Ambulatory Visit: Payer: Self-pay | Admitting: Physician Assistant

## 2022-09-11 NOTE — Telephone Encounter (Signed)
Left message on voicemail need to know if she wants to increase Mounjaro to 15 mg once a week injection.

## 2022-09-15 ENCOUNTER — Other Ambulatory Visit (HOSPITAL_COMMUNITY): Payer: Self-pay

## 2022-09-15 MED ORDER — TIRZEPATIDE 15 MG/0.5ML ~~LOC~~ SOAJ
15.0000 mg | SUBCUTANEOUS | 0 refills | Status: DC
  Filled 2022-09-15 – 2022-09-26 (×3): qty 2, 28d supply, fill #0

## 2022-09-15 NOTE — Telephone Encounter (Signed)
Spoke to pt asked her if she wants to increase to 15 mg dose for Mounjaro? Pt said yes, she been staying about the same so she will try the increase. Told her okay will send to pharmacy. Pt verbalized understanding.

## 2022-09-17 ENCOUNTER — Other Ambulatory Visit (HOSPITAL_COMMUNITY): Payer: Self-pay

## 2022-09-19 ENCOUNTER — Other Ambulatory Visit (HOSPITAL_COMMUNITY): Payer: Self-pay

## 2022-09-22 ENCOUNTER — Other Ambulatory Visit (HOSPITAL_COMMUNITY): Payer: Self-pay

## 2022-09-23 ENCOUNTER — Encounter: Payer: Self-pay | Admitting: Physician Assistant

## 2022-09-24 ENCOUNTER — Telehealth: Payer: Self-pay | Admitting: *Deleted

## 2022-09-24 NOTE — Telephone Encounter (Signed)
PA needed for Zepbound 15 mg PA done thru Covermymeds. Awaiting determination. KEY: [id number]

## 2022-09-25 ENCOUNTER — Other Ambulatory Visit (HOSPITAL_COMMUNITY): Payer: Self-pay

## 2022-09-25 ENCOUNTER — Other Ambulatory Visit: Payer: Self-pay

## 2022-09-26 ENCOUNTER — Other Ambulatory Visit (HOSPITAL_COMMUNITY): Payer: Self-pay

## 2022-09-26 NOTE — Telephone Encounter (Signed)
Spoke to pt told her PA for Zepbound 2.5 mg has been approved till 03/25/2023, pharmacy notified. Pt verbalized understanding.

## 2022-09-26 NOTE — Telephone Encounter (Signed)
Received response from insurance PA for Zepbound, The request has been approved. The authorization is effective from 09/26/2022 to 03/25/2023.  [place] spoke to Joe told him Zepbound 2.5 mg has been approved, effective 09/26/2022 to 03/25/2023.

## 2022-09-29 ENCOUNTER — Other Ambulatory Visit (HOSPITAL_COMMUNITY): Payer: Self-pay

## 2022-09-29 MED ORDER — ZEPBOUND 15 MG/0.5ML ~~LOC~~ SOAJ
15.0000 mg | SUBCUTANEOUS | 0 refills | Status: DC
  Filled 2022-09-29: qty 2, 28d supply, fill #0

## 2022-10-12 ENCOUNTER — Other Ambulatory Visit: Payer: Self-pay | Admitting: Physician Assistant

## 2022-10-14 ENCOUNTER — Other Ambulatory Visit (HOSPITAL_COMMUNITY): Payer: Self-pay

## 2022-10-20 ENCOUNTER — Other Ambulatory Visit: Payer: Self-pay

## 2022-10-20 ENCOUNTER — Other Ambulatory Visit: Payer: Self-pay | Admitting: Physician Assistant

## 2022-10-20 ENCOUNTER — Other Ambulatory Visit (HOSPITAL_COMMUNITY): Payer: Self-pay

## 2022-10-22 ENCOUNTER — Other Ambulatory Visit (HOSPITAL_COMMUNITY): Payer: Self-pay

## 2022-10-22 MED ORDER — LISDEXAMFETAMINE DIMESYLATE 60 MG PO CHEW
60.0000 mg | CHEWABLE_TABLET | Freq: Every day | ORAL | 0 refills | Status: DC
  Filled 2022-10-22 – 2022-10-23 (×2): qty 30, 30d supply, fill #0

## 2022-10-23 ENCOUNTER — Other Ambulatory Visit (HOSPITAL_COMMUNITY): Payer: Self-pay

## 2022-10-31 ENCOUNTER — Encounter (HOSPITAL_BASED_OUTPATIENT_CLINIC_OR_DEPARTMENT_OTHER): Payer: Commercial Managed Care - PPO | Admitting: Radiology

## 2022-10-31 DIAGNOSIS — Z1231 Encounter for screening mammogram for malignant neoplasm of breast: Secondary | ICD-10-CM

## 2022-11-06 ENCOUNTER — Other Ambulatory Visit (HOSPITAL_COMMUNITY): Payer: Self-pay

## 2022-11-09 ENCOUNTER — Encounter: Payer: Self-pay | Admitting: Physician Assistant

## 2022-11-10 ENCOUNTER — Other Ambulatory Visit (HOSPITAL_COMMUNITY): Payer: Self-pay

## 2022-11-10 MED ORDER — ZEPBOUND 15 MG/0.5ML ~~LOC~~ SOAJ
15.0000 mg | SUBCUTANEOUS | 0 refills | Status: DC
  Filled 2022-11-10: qty 2, 28d supply, fill #0

## 2022-11-20 ENCOUNTER — Other Ambulatory Visit: Payer: Self-pay

## 2022-11-20 ENCOUNTER — Other Ambulatory Visit (HOSPITAL_COMMUNITY): Payer: Self-pay

## 2022-11-20 ENCOUNTER — Other Ambulatory Visit: Payer: Self-pay | Admitting: Physician Assistant

## 2022-11-20 MED ORDER — ONDANSETRON HCL 4 MG PO TABS
4.0000 mg | ORAL_TABLET | Freq: Three times a day (TID) | ORAL | 0 refills | Status: DC | PRN
  Filled 2022-11-20: qty 20, 7d supply, fill #0

## 2022-11-21 ENCOUNTER — Other Ambulatory Visit: Payer: Self-pay

## 2022-11-24 ENCOUNTER — Telehealth: Payer: Commercial Managed Care - PPO | Admitting: Physician Assistant

## 2022-11-24 ENCOUNTER — Other Ambulatory Visit (HOSPITAL_COMMUNITY): Payer: Self-pay

## 2022-11-24 DIAGNOSIS — M549 Dorsalgia, unspecified: Secondary | ICD-10-CM | POA: Diagnosis not present

## 2022-11-24 MED ORDER — CYCLOBENZAPRINE HCL 10 MG PO TABS
5.0000 mg | ORAL_TABLET | Freq: Three times a day (TID) | ORAL | 0 refills | Status: DC | PRN
  Filled 2022-11-24: qty 30, 10d supply, fill #0

## 2022-11-24 MED ORDER — NAPROXEN 500 MG PO TABS
500.0000 mg | ORAL_TABLET | Freq: Two times a day (BID) | ORAL | 0 refills | Status: DC
  Filled 2022-11-24: qty 30, 15d supply, fill #0

## 2022-11-24 NOTE — Progress Notes (Signed)

## 2022-11-25 ENCOUNTER — Other Ambulatory Visit (HOSPITAL_COMMUNITY): Payer: Self-pay

## 2022-11-25 DIAGNOSIS — Z79899 Other long term (current) drug therapy: Secondary | ICD-10-CM | POA: Diagnosis not present

## 2022-11-25 DIAGNOSIS — F902 Attention-deficit hyperactivity disorder, combined type: Secondary | ICD-10-CM | POA: Diagnosis not present

## 2022-11-25 MED ORDER — LISDEXAMFETAMINE DIMESYLATE 60 MG PO CHEW
60.0000 mg | CHEWABLE_TABLET | Freq: Every day | ORAL | 0 refills | Status: DC
  Filled 2023-02-18: qty 30, 30d supply, fill #0

## 2022-11-25 MED ORDER — LISDEXAMFETAMINE DIMESYLATE 60 MG PO CHEW
60.0000 mg | CHEWABLE_TABLET | Freq: Every day | ORAL | 0 refills | Status: DC
  Filled 2023-04-17: qty 30, 30d supply, fill #0

## 2022-11-25 MED ORDER — LISDEXAMFETAMINE DIMESYLATE 60 MG PO CHEW
1.0000 | CHEWABLE_TABLET | Freq: Every day | ORAL | 0 refills | Status: DC
  Filled 2023-05-15: qty 30, 30d supply, fill #0

## 2022-11-26 ENCOUNTER — Encounter: Payer: Self-pay | Admitting: Family Medicine

## 2022-11-26 ENCOUNTER — Other Ambulatory Visit: Payer: Self-pay

## 2022-11-26 ENCOUNTER — Other Ambulatory Visit (HOSPITAL_COMMUNITY): Payer: Self-pay

## 2022-11-26 ENCOUNTER — Ambulatory Visit (INDEPENDENT_AMBULATORY_CARE_PROVIDER_SITE_OTHER): Payer: Commercial Managed Care - PPO | Admitting: Family Medicine

## 2022-11-26 VITALS — BP 136/84 | HR 113 | Ht 66.0 in | Wt 206.4 lb

## 2022-11-26 DIAGNOSIS — M542 Cervicalgia: Secondary | ICD-10-CM | POA: Diagnosis not present

## 2022-11-26 DIAGNOSIS — G8929 Other chronic pain: Secondary | ICD-10-CM

## 2022-11-26 DIAGNOSIS — M25511 Pain in right shoulder: Secondary | ICD-10-CM | POA: Diagnosis not present

## 2022-11-26 MED ORDER — TIZANIDINE HCL 4 MG PO TABS
4.0000 mg | ORAL_TABLET | Freq: Three times a day (TID) | ORAL | 1 refills | Status: DC | PRN
  Filled 2022-11-26: qty 60, 20d supply, fill #0

## 2022-11-26 NOTE — Patient Instructions (Addendum)
Thank you for coming in today.   You received an injection today. Seek immediate medical attention if the joint becomes red, extremely painful, or is oozing fluid.   I've referred you to Physical Therapy.  Let us know if you don't hear from them in one week.   Recheck in 6 weeks or sooner.   Let me know if you want that epidural before then.

## 2022-11-26 NOTE — Progress Notes (Unsigned)
Rubin Payor, PhD, LAT, ATC acting as a scribe for Crystal Graham, MD.  Crystal Madden is a 47 y.o. female who presents to [facility] today for f/u R shoulder pain and cervical radiculopathy. Pt was last seen by Dr. Denyse Amass on 05/23/22 and was given a R subacromial steroid injection and was referred to PT, completing 4 visits. Pt also sent a MyChart message reporting a flare of her lumbar radiculopathy and a repeat ESI was ordered. Pt canceled original f/u visit on 07/01/22. Today, pt reports worsening of sx since last visit, sx are more severe. Notes more nerve-type pain into right shoulder and arm extending into the lower arm. C/O n/t in the fingers, but more of a burning sensation. Minimal relief with ice, heat, IBU, Naproxen, HEP provided by PT. Focal soreness in right periscapular region. Sx have become more constant over the past week.   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  She thinks holding the camera and long telephotos lens is challenging as it is heavy  Dx imaging: 01/13/2022 L-spine & c-spine MRI 12/30/2021 L-spine & c-spine x-ray   Pertinent review of systems: No fevers or chills  Relevant historical information: Hypertension   Exam:  BP 136/84   Pulse (!) 113   Ht  (1.676 m)   Wt 206 lb 6.4 oz (93.6 kg)   SpO2 99%   BMI 33.31 kg/m  General: Well Developed, well nourished, and in no acute distress.   MSK: C-spine: Normal appearing Nontender palpation cervical midline. Tender palpation right trapezius Decreased cervical motion. Upper extremity strength is intact except noted below. Reflexes and sensation are intact distally.   Right shoulder: Normal-appearing Nontender to palpation along the shoulder.  Tender palpation right trapezius and periscapular muscles Shoulder motion pain with abduction. Positive Hawkins and Neer's test. Positive empty can  test. Negative Yergason's and speeds test. Strength reduced abduction 4/5 intact otherwise.   Lab and Radiology Results  Procedure: Real-time Ultrasound Guided Injection of right shoulder subacromial bursa Device: Philips Affiniti 50G Images permanently stored and available for review in PACS Subacromial bursitis present on ultrasound examination prior to injection. Verbal informed consent obtained.  Discussed risks and benefits of procedure. Warned about infection, bleeding, hyperglycemia damage to structures among others. Patient expresses understanding and agreement Time-out conducted.   Noted no overlying erythema, induration, or other signs of local infection.   Skin prepped in a sterile fashion.   Local anesthesia: Topical Ethyl chloride.   With sterile technique and under real time ultrasound guidance: 40 mg of Kenalog and 2 mL Marcaine injected into subacromial bursa. Fluid seen entering the bursa.   Completed without difficulty   Pain immediately resolved suggesting accurate placement of the medication.   Advised to call if fevers/chills, erythema, induration, drainage, or persistent bleeding.   Images permanently stored and available for review in the ultrasound unit.  Impression: Technically successful ultrasound guided injection.        Assessment and Plan: 47 y.o. female with right shoulder pain multifactorial.  Component of her pain is thought to be subacromial bursitis.  This was injected today.  Additionally she has right trapezius and periscapular pain and right cervical paraspinal muscular pain thought to be due to muscle spasm and dysfunction.  There may be a cervical radicular component but this is less likely.  Plan for subacromial injection and physical therapy trial.  If not improving or if worsening can  proceed to epidural steroid injection trial.  Tizanidine prescribed.  We talked about dosing ibuprofen and Tylenol.  Recheck in 6 weeks.   PDMP not  reviewed this encounter. Orders Placed This Encounter  Procedures   [place] LIMITED JOINT SPACE STRUCTURES UP RIGHT(NO LINKED CHARGES)    Order Specific Question:   Reason for Exam (SYMPTOM  OR DIAGNOSIS REQUIRED)    Answer:   right shoulder pain    Order Specific Question:   Preferred imaging location?    Answer:   [facility] referral to Physical Therapy    Referral Priority:   Routine    Referral Type:   Physical Medicine    Referral Reason:   Specialty Services Required    Requested Specialty:   Physical Therapy    Number of Visits Requested:   1   Meds ordered this encounter  Medications   tiZANidine (ZANAFLEX) 4 MG tablet    Sig: Take 1 tablet (4 mg total) by mouth every 8 (eight) hours as needed for muscle spasms.    Dispense:  60 tablet    Refill:  1     Discussed warning signs or symptoms. Please see discharge instructions. Patient expresses understanding.   The above documentation has been reviewed and is accurate and complete Crystal Madden, M.D.

## 2022-11-28 ENCOUNTER — Ambulatory Visit (INDEPENDENT_AMBULATORY_CARE_PROVIDER_SITE_OTHER): Payer: Commercial Managed Care - PPO | Admitting: Physical Therapy

## 2022-11-28 DIAGNOSIS — G8929 Other chronic pain: Secondary | ICD-10-CM

## 2022-11-28 DIAGNOSIS — M25511 Pain in right shoulder: Secondary | ICD-10-CM

## 2022-11-28 DIAGNOSIS — M5412 Radiculopathy, cervical region: Secondary | ICD-10-CM

## 2022-11-28 NOTE — Therapy (Signed)
OUTPATIENT PHYSICAL THERAPY UPPER EXTREMITY EVALUATION   Patient Name: Crystal Madden MRN: [id number] DOB:1976-01-26, 47 y.o., female Today's Date: 11/28/2022     Past Medical History:  Diagnosis Date   Anxiety    Depression    High blood pressure    Hyperlipidemia    Lower back pain    Obesity (BMI 30-39.9)    Past Surgical History:  Procedure Laterality Date   BLADDER SUSPENSION     CESAREAN SECTION     LUMBAR MICRODISCECTOMY  2018   SPINE SURGERY     TONSILLECTOMY  2000   Patient Active Problem List   Diagnosis Date Noted   Cervical disc disorder with radiculopathy 03/09/2020   Insulin resistance 12/29/2019   Hypertension 06/14/2019   Vitamin D deficiency 08/28/2018   BMI 33.0-33.9,adult 08/27/2018   Attention deficit hyperactivity disorder (ADHD), combined type 08/27/2018   Diverticulitis of sigmoid colon 07/22/2016    PCP: Jarold Motto  REFERRING PROVIDER: [name]   REFERRING DIAG: R shoulder, neck pain.   THERAPY DIAG:  No diagnosis found.  Rationale for Evaluation and Treatment Rehabilitation  ONSET DATE:   SUBJECTIVE:                                                                                                                                                                                      SUBJECTIVE STATEMENT: Eval:       Pt was having R shoulder pain. Did get injection last week that has helped significantly. Still a bit sore. She now notes mostly tightness in R UT region. She has had ongoing neck pain in past years, with tingling into R UE. 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    PERTINENT HISTORY: none   PAIN:  Are you having pain? Yes: NPRS scale: 5/10 Pain location: Tightness Bil neck R > L.  Pain description: tightness Aggravating factors: none stated Relieving factors: none stated    PRECAUTIONS: None   WEIGHT BEARING  RESTRICTIONS: No  FALLS:  Has patient fallen in last 6 months? No    PLOF: Independent  PATIENT GOALS: Decreased pain and tightness in neck.     OBJECTIVE:   DIAGNOSTIC FINDINGS:  MRI 01/13/22: neck IMPRESSION: 1. C3-4 central disc protrusion indenting the right ventral cord, new/progressed from 2021. Chronic right foraminal protrusion at this level which could affect the right C4 nerve root. 2. Mild to moderate left foraminal narrowing at C4-5   COGNITION: Overall cognitive status: Within functional limits for tasks assessed      POSTURE:   UPPER EXTREMITY ROM:  Shoulders: WFL  Neck: WFL  pain with full R rotation and R SB.   UPPER EXTREMITY MMT:   Shoulders: 4+/5     JOINT MOBILITY TESTING:    PALPATION:  Tightness in bil UT, levator, and into R cervical paraspinals.     TODAY'S TREATMENT:                                                                                                                                                    DATE:   05/26/2022 Manual: STM to R UT and levator, manual cervical traction 10 sec x 10, manual UT and levator stretches.  Trigger Point Dry-Needling  Treatment instructions: Expect mild to moderate muscle soreness. S/S of pneumothorax if dry needled over a lung field, and to seek immediate medical attention should they occur. Patient verbalized understanding of these instructions and education.  Patient Consent Given: Yes Education handout provided: Previously provided Muscles treated: R UT and levator Electrical stimulation performed: No Parameters: N/A Treatment response/outcome: Twitch response, palpable increase in muscle length.      PATIENT EDUCATION: Education details: PT POC, Exam findings, HEP Person educated: Patient Education method: Explanation, Demonstration, Tactile cues, Verbal cues, and Handouts Education comprehension: verbalized understanding, returned demonstration, verbal cues required, tactile  cues required, and needs further education   HOME EXERCISE PROGRAM: Will issue next visit.     ASSESSMENT:  CLINICAL IMPRESSION: Patient presents with primary complaint of increased pain in neck and R shoulder. R shoulder pain much improved since recent injection. She does have increased muscle tension and pain neck region, R>L. She also has mild pain with full cervical rotation. She has decreased ability for full functional activity due to pain. Pt to benefit from skilled PT to improve deficits and pain.    OBJECTIVE IMPAIRMENTS: decreased activity tolerance, decreased mobility, increased fascial restrictions, increased muscle spasms, improper body mechanics, and pain.   ACTIVITY LIMITATIONS: carrying, lifting, and reach over head  PARTICIPATION LIMITATIONS: cleaning, laundry, shopping, community activity, and occupation  PERSONAL FACTORS:  none  are also affecting patient's functional outcome.   REHAB POTENTIAL: Good  CLINICAL DECISION MAKING: Stable/uncomplicated  EVALUATION COMPLEXITY: Low   GOALS: Goals reviewed with patient? Yes  SHORT TERM GOALS: Target date: 06/09/2022    Pt to be independent with initial HEP  Goal status: INITIAL  2.  Pt to report decreased pain and tightness in neck to 0-3/10   Goal status: INITIAL    LONG TERM GOALS: Target date: 07/21/2022    Pt to be independent with final HEP   Goal status: INITIAL  2.  Pt to report decreased pain in R neck and shoulder to 0-2/10 with activity   Goal status: INITIAL  3.  Pt to demo soft tissue limitations to be WNL, for decreased pain   Goal status: INITIAL  4.  Pt to demo full ROM for neck and shoulder, to be pain free, to improve ability for lift, reach, carry, and IADLs.   Goal status: INITIAL     PLAN: PT FREQUENCY: 1-2x/week  PT DURATION: 8 weeks  PLANNED INTERVENTIONS: Therapeutic exercises, Therapeutic activity, Neuromuscular re-education, Patient/Family education, Self Care,  Joint mobilization, Joint manipulation, DME instructions, Dry Needling, Electrical stimulation, Spinal manipulation, Spinal mobilization, Cryotherapy, Moist heat, Taping, Traction, Ionotophoresis /ml Dexamethasone, and Manual therapy  PLAN FOR NEXT SESSION:    Sedalia Muta, PT, DPT 2:05 PM  11/28/22

## 2022-12-01 ENCOUNTER — Encounter: Payer: Self-pay | Admitting: Physical Therapy

## 2022-12-03 ENCOUNTER — Encounter: Payer: Self-pay | Admitting: Physical Therapy

## 2022-12-03 ENCOUNTER — Ambulatory Visit (INDEPENDENT_AMBULATORY_CARE_PROVIDER_SITE_OTHER): Payer: Commercial Managed Care - PPO | Admitting: Physical Therapy

## 2022-12-03 DIAGNOSIS — M5412 Radiculopathy, cervical region: Secondary | ICD-10-CM

## 2022-12-03 DIAGNOSIS — G8929 Other chronic pain: Secondary | ICD-10-CM

## 2022-12-03 DIAGNOSIS — M25511 Pain in right shoulder: Secondary | ICD-10-CM | POA: Diagnosis not present

## 2022-12-03 NOTE — Therapy (Signed)
OUTPATIENT PHYSICAL THERAPY UPPER EXTREMITY TREATMENT    Patient Name: Crystal Madden DOB:24-May-1976, 47 y.o., female Today's Date: 12/03/2022   PT End of Session - 12/03/22 1153     Visit Number 2    Number of Visits 12    Date for PT Re-Evaluation 01/09/23    Authorization Type [facility] [employer]    PT Start Time 1103    PT Stop Time 1141    PT Time Calculation (min) 38 min    Activity Tolerance Patient tolerated treatment well    Behavior During Therapy WFL for tasks assessed/performed               Past Medical History:  Diagnosis Date   Anxiety    Depression    High blood pressure    Hyperlipidemia    Lower back pain    Obesity (BMI 30-39.9)    Past Surgical History:  Procedure Laterality Date   BLADDER SUSPENSION     CESAREAN SECTION     LUMBAR MICRODISCECTOMY  2018   SPINE SURGERY     TONSILLECTOMY  2000   Patient Active Problem List   Diagnosis Date Noted   Cervical disc disorder with radiculopathy 03/09/2020   Insulin resistance 12/29/2019   Hypertension 06/14/2019   Vitamin D deficiency 08/28/2018   BMI 33.0-33.9,adult 08/27/2018   Attention deficit hyperactivity disorder (ADHD), combined type 08/27/2018   Diverticulitis of sigmoid colon 07/22/2016    PCP: Jarold Motto  REFERRING PROVIDER: [name]   REFERRING DIAG: R shoulder, neck pain.   THERAPY DIAG:  Radiculopathy, cervical region  Chronic right shoulder pain  Rationale for Evaluation and Treatment Rehabilitation  ONSET DATE:   SUBJECTIVE:                                                                                                                                                                                      SUBJECTIVE STATEMENT: Pt states pain into arm, and tightness in UT/levator region. Minimal change in symptoms.   Eval:   Pt having increased pain in R UE and UT. She has tingling into UE. She had recent injection in R shoulder with mild  relief of symptoms. She had very similar symptoms in October, was seen in PT then as well, with good results.  She has much tension in UT and shoulder blade region, with intermittent tingling into UE.   PERTINENT HISTORY: none  PAIN:  Are you having pain? Yes: NPRS scale: 5/10 Pain location: Tightness neck on  R , R shoulder, into UE Pain description: tightness in neck, tingling into UE Aggravating factors: increased activity, work, using Architectural technologist  factors: none stated    PRECAUTIONS: None   WEIGHT BEARING RESTRICTIONS: No  FALLS:  Has patient fallen in last 6 months? No   PLOF: Independent  PATIENT GOALS: Decreased pain in neck and UE    OBJECTIVE:   DIAGNOSTIC FINDINGS:  MRI 01/13/22: neck IMPRESSION: 1. C3-4 central disc protrusion indenting the right ventral cord, new/progressed from 2021. Chronic right foraminal protrusion at this level which could affect the right C4 nerve root. 2. Mild to moderate left foraminal narrowing at C4-5   COGNITION: Overall cognitive status: Within functional limits for tasks assessed      POSTURE: posture asymmetry  R hip and R shoulder higher,   L leg shorter.    UPPER EXTREMITY ROM:   Shoulders: WFL  Neck: WFL    UPPER EXTREMITY MMT:  Shoulders: 4+/5    JOINT MOBILITY TESTING:    PALPATION:  Tightness in R UT, levator, and into R rhomboid and mid trap. No tenderness in posterior shoulder,  No pain in neck with palpation or PA s     TODAY'S TREATMENT:                                                                                                                                                    DATE:    12/03/22 Therapeutic Exercise: Aerobic: Supine:  Seated:  Standing:  Stretches:  Supine SA press/hold 5 sec x 10;  Pec stretch/doorway 30 sec x 3;  Neuromuscular Re-education: heel lift placed in L shoe to improve leg length deficit and thoracic posture, good comfort today, pt given instructions for use and  wear time.Ambulation in clinic 2 x 150 ft.  Manual Therapy:  DTM to R UT and levator, cupping to Levator,  PA mobs to upper t-spine  Trigger Point Dry-Needling  Treatment instructions: Expect mild to moderate muscle soreness. S/S of pneumothorax if dry needled over a lung field, and to seek immediate medical attention should they occur. Patient verbalized understanding of these instructions and education. Patient Consent Given: Yes Education handout provided: Previously provided Muscles treated: R UT and levator  Electrical stimulation performed: No Parameters: N/A Treatment response/outcome: twitch response elicited, palpable increase in muscle length.    11/28/22 Therapeutic Exercise: Aerobic: Supine: Seated: Standing: Shoulder ER isometrics x10 , 3 sec holds.  (Arom painful) Stretches:  Supine SA press/hold 5 sec x 10;  Pec stretch/doorway 30 sec x 3;  Neuromuscular Re-education: Manual Therapy:   PATIENT EDUCATION: Education details: reviewed HEP Person educated: Patient Education method: Explanation, Demonstration, Tactile cues, Verbal cues, and Handouts Education comprehension: verbalized understanding, returned demonstration, verbal cues required, tactile cues required, and needs further education   HOME EXERCISE PROGRAM: Access Code: [email or web address] URL: [email or web address] Date: 12/01/2022 Prepared by: Sedalia Muta  Exercises - Doorway Pec Stretch at 90 Degrees Abduction  - 2 x daily -  3 reps - 30 hold - Standing Isometric Shoulder External Rotation with Doorway and Towel Roll  - 2 x daily - 1 sets - 10 reps - 5 hold - Supine Scapular Protraction in Flexion with Dumbbells  - 2 x daily - 1 sets - 10 reps - 5 hold   ASSESSMENT:  CLINICAL IMPRESSION: 12/03/2022 Pt with more muscle tension and trigger points in UT and levator today. Pt with good response for dry needling. Plan to progress slight strength of shoulder, as muscle tension and pain improves.  Pt  given heel lift in L shoe, to accommodate for leg length deficit, L leg shorter. Will continue to assess effects, pt with good comfort today.   Eval: Patient presents with primary complaint of increased pain in neck and R shoulder. R shoulder somewhat  improved since recent injection. She does have increased muscle tension and pain neck and shoulder blade region on R. She has tingling into R UE and  decreased ability for full functional activity due to pain and deficits. She will benefit from decreasing muscle tension in cervical and shoulder region and also strengthening for shoulder and scapular muscles. Pt to benefit from skilled PT to improve deficits and pain.    OBJECTIVE IMPAIRMENTS: decreased activity tolerance, decreased mobility, decreased strength, increased fascial restrictions, increased muscle spasms, impaired sensation, impaired UE functional use, improper body mechanics, and pain.   ACTIVITY LIMITATIONS: carrying, lifting, reach over head, and locomotion level  PARTICIPATION LIMITATIONS: meal prep, cleaning, laundry, driving, shopping, community activity, and occupation  PERSONAL FACTORS:  none  are also affecting patient's functional outcome.   REHAB POTENTIAL: Good  CLINICAL DECISION MAKING: Stable/uncomplicated  EVALUATION COMPLEXITY: Low   GOALS: Goals reviewed with patient? Yes  SHORT TERM GOALS: Target date: 12/12/2022     Pt to be independent with initial HEP  Goal status: INITIAL     LONG TERM GOALS: Target date: 01/09/2023    Pt to be independent with final HEP   Goal status: INITIAL  2.  Pt to report decreased pain in R neck and shoulder to 0-2/10 with activity   Goal status: INITIAL  3.  Pt to demo soft tissue limitations in neck and shoulder to be WNL, for decreased pain   Goal status: INITIAL  4.   Pt to demo full ROM for neck and shoulder, to be pain free, to improve ability for lift, reach, carry, and IADLs.   Goal status:  INITIAL    PLAN: PT FREQUENCY: 1-2x/week  PT DURATION: 8 weeks  PLANNED INTERVENTIONS: Therapeutic exercises, Therapeutic activity, Neuromuscular re-education, Patient/Family education, Self Care, Joint mobilization, Joint manipulation, DME instructions, Dry Needling, Electrical stimulation, Spinal manipulation, Spinal mobilization, Cryotherapy, Moist heat, Taping, Traction, Ionotophoresis 4mg /ml Dexamethasone, and Manual therapy  PLAN FOR NEXT SESSION:  Check leg length, for posture, tightness on R.    Sedalia Muta, PT, DPT 11:54 AM  12/03/22

## 2022-12-05 ENCOUNTER — Other Ambulatory Visit (HOSPITAL_COMMUNITY): Payer: Self-pay

## 2022-12-05 MED ORDER — ZEPBOUND 15 MG/0.5ML ~~LOC~~ SOAJ
15.0000 mg | SUBCUTANEOUS | 0 refills | Status: DC
  Filled 2022-12-05: qty 2, 28d supply, fill #0

## 2022-12-05 NOTE — Addendum Note (Signed)
Addended by: Jimmye Norman on: 12/05/2022 08:29 AM   Modules accepted: Orders

## 2022-12-10 ENCOUNTER — Encounter: Payer: Commercial Managed Care - PPO | Admitting: Physical Therapy

## 2022-12-15 ENCOUNTER — Encounter: Payer: Self-pay | Admitting: Physical Therapy

## 2022-12-15 ENCOUNTER — Ambulatory Visit (INDEPENDENT_AMBULATORY_CARE_PROVIDER_SITE_OTHER): Payer: Commercial Managed Care - PPO | Admitting: Physical Therapy

## 2022-12-15 DIAGNOSIS — M25511 Pain in right shoulder: Secondary | ICD-10-CM

## 2022-12-15 DIAGNOSIS — G8929 Other chronic pain: Secondary | ICD-10-CM

## 2022-12-15 DIAGNOSIS — M5412 Radiculopathy, cervical region: Secondary | ICD-10-CM | POA: Diagnosis not present

## 2022-12-15 NOTE — Therapy (Signed)
OUTPATIENT PHYSICAL THERAPY UPPER EXTREMITY TREATMENT    Patient Name: Crystal Madden MRN: [id number] DOB:1976-07-17, 47 y.o., female Today's Date: 12/15/2022   PT End of Session - 12/15/22 1102     Visit Number 3    Number of Visits 12    Date for PT Re-Evaluation 01/09/23    Authorization Type [facility] [employer]    PT Start Time 1103    PT Stop Time 1145    PT Time Calculation (min) 42 min    Activity Tolerance Patient tolerated treatment well    Behavior During Therapy WFL for tasks assessed/performed               Past Medical History:  Diagnosis Date   Anxiety    Depression    High blood pressure    Hyperlipidemia    Lower back pain    Obesity (BMI 30-39.9)    Past Surgical History:  Procedure Laterality Date   BLADDER SUSPENSION     CESAREAN SECTION     LUMBAR MICRODISCECTOMY  2018   SPINE SURGERY     TONSILLECTOMY  2000   Patient Active Problem List   Diagnosis Date Noted   Cervical disc disorder with radiculopathy 03/09/2020   Insulin resistance 12/29/2019   Hypertension 06/14/2019   Vitamin D deficiency 08/28/2018   BMI 33.0-33.9,adult 08/27/2018   Attention deficit hyperactivity disorder (ADHD), combined type 08/27/2018   Diverticulitis of sigmoid colon 07/22/2016    PCP: Jarold Motto  REFERRING PROVIDER: [name]   REFERRING DIAG: R shoulder, neck pain.   THERAPY DIAG:  Radiculopathy, cervical region  Chronic right shoulder pain  Rationale for Evaluation and Treatment Rehabilitation  ONSET DATE:   SUBJECTIVE:                                                                                                                                                                                      SUBJECTIVE STATEMENT: Pt states continued pain, mostly muscle tension in R UT,  levator,  and scapular  area  . Shoulder is feeling a bit better. Slightly less pain into her arm. Has been wearing heel lift with good comfort.   Eval:   Pt  having increased pain in R UE and UT. She has tingling into UE. She had recent injection in R shoulder with mild relief of symptoms. She had very similar symptoms in October, was seen in PT then as well, with good results.  She has much tension in UT and shoulder blade region, with intermittent tingling into UE.   PERTINENT HISTORY: none  PAIN:  Are you having pain? Yes: NPRS scale: 5/10 Pain location: Tightness neck  on  R , R shoulder, into UE Pain description: tightness in neck, tingling into UE Aggravating factors: increased activity, work, using camera  Relieving factors: none stated    PRECAUTIONS: None   WEIGHT BEARING RESTRICTIONS: No  FALLS:  Has patient fallen in last 6 months? No   PLOF: Independent  PATIENT GOALS: Decreased pain in neck and UE    OBJECTIVE:   DIAGNOSTIC FINDINGS:  MRI 01/13/22: neck IMPRESSION: 1. C3-4 central disc protrusion indenting the right ventral cord, new/progressed from 2021. Chronic right foraminal protrusion at this level which could affect the right C4 nerve root. 2. Mild to moderate left foraminal narrowing at C4-5   COGNITION: Overall cognitive status: Within functional limits for tasks assessed      POSTURE: posture asymmetry  R hip and R shoulder higher,   L leg shorter.    UPPER EXTREMITY ROM:   Shoulders: WFL  Neck: WFL    UPPER EXTREMITY MMT:  Shoulders: 4+/5    JOINT MOBILITY TESTING:    PALPATION:  Tightness in R UT, levator, and into R rhomboid and mid trap. No tenderness in posterior shoulder,  No pain in neck with palpation or PA s     TODAY'S TREATMENT:                                                                                                                                                    DATE:     12/15/22: Therapeutic Exercise: Aerobic: Supine:  Horiz abd 2 x 10 , 2 lb ; bridging x 15;  Prone:  T x 15;  S/L shoulder ER 2 x 10, 2 lb ;  Seated:  Standing:  Stretches:  Supine SA press/hold 5  sec x 10;   Neuromuscular Re-education:  Manual Therapy:  DTM to R cervical paraspinals, UT, levator and medial scap border.   PA mobs to upper t-spine,  cervical distraction x 10 ;  Trigger Point Dry-Needling  Treatment instructions: Expect mild to moderate muscle soreness. S/S of pneumothorax if dry needled over a lung field, and to seek immediate medical attention should they occur. Patient verbalized understanding of these instructions and education. Patient Consent Given: Yes Education handout provided: Previously provided Muscles treated: R UT and levator  Electrical stimulation performed: No Parameters: N/A Treatment response/outcome: twitch response elicited, palpable increase in muscle length.    12/03/22 Therapeutic Exercise: Aerobic: Supine:  Seated:  Standing:  Stretches:  Supine SA press/hold 5 sec x 10;  Pec stretch/doorway 30 sec x 3;  Neuromuscular Re-education: heel lift placed in L shoe to improve leg length deficit and thoracic posture, good comfort today, pt given instructions for use and wear time.Ambulation in clinic 2 x 150 ft.  Manual Therapy:  DTM to R UT and levator, cupping to Levator,  PA mobs to upper t-spine  Trigger  Point Dry-Needling  Treatment instructions: Expect mild to moderate muscle soreness. S/S of pneumothorax if dry needled over a lung field, and to seek immediate medical attention should they occur. Patient verbalized understanding of these instructions and education. Patient Consent Given: Yes Education handout provided: Previously provided Muscles treated: R UT and levator  Electrical stimulation performed: No Parameters: N/A Treatment response/outcome: twitch response elicited, palpable increase in muscle length.    11/28/22 Therapeutic Exercise: Aerobic: Supine: Seated: Standing: Shoulder ER isometrics x10 , 3 sec holds.  (Arom painful) Stretches:  Supine SA press/hold 5 sec x 10;  Pec stretch/doorway 30 sec x 3;  Neuromuscular  Re-education: Manual Therapy:   PATIENT EDUCATION: Education details: reviewed HEP Person educated: Patient Education method: Explanation, Demonstration, Tactile cues, Verbal cues, and Handouts Education comprehension: verbalized understanding, returned demonstration, verbal cues required, tactile cues required, and needs further education   HOME EXERCISE PROGRAM: Access Code: [email or web address] URL: [email or web address] Date: 12/01/2022 Prepared by: Sedalia Muta  Exercises - Doorway Pec Stretch at 90 Degrees Abduction  - 2 x daily - 3 reps - 30 hold - Standing Isometric Shoulder External Rotation with Doorway and Towel Roll  - 2 x daily - 1 sets - 10 reps - 5 hold - Supine Scapular Protraction in Flexion with Dumbbells  - 2 x daily - 1 sets - 10 reps - 5 hold   ASSESSMENT:  CLINICAL IMPRESSION: 12/15/2022  Pt with muscle tension and trigger points in UT and levator today. She has soreness in medial scap border, but minimal trigger points found there today. She does have weakness with scapular muscles, will continue strengthening for this. Shoulder slightly less painful with activity. Will continue to focus on neck/traction and muscle tension and strength. Discussed optimal/upright posture in standing today, pt with tendency for sway back and slight lean to L even with heel lift in.   Eval: Patient presents with primary complaint of increased pain in neck and R shoulder. R shoulder somewhat  improved since recent injection. She does have increased muscle tension and pain neck and shoulder blade region on R. She has tingling into R UE and  decreased ability for full functional activity due to pain and deficits. She will benefit from decreasing muscle tension in cervical and shoulder region and also strengthening for shoulder and scapular muscles. Pt to benefit from skilled PT to improve deficits and pain.    OBJECTIVE IMPAIRMENTS: decreased activity tolerance, decreased mobility,  decreased strength, increased fascial restrictions, increased muscle spasms, impaired sensation, impaired UE functional use, improper body mechanics, and pain.   ACTIVITY LIMITATIONS: carrying, lifting, reach over head, and locomotion level  PARTICIPATION LIMITATIONS: meal prep, cleaning, laundry, driving, shopping, community activity, and occupation  PERSONAL FACTORS:  none  are also affecting patient's functional outcome.   REHAB POTENTIAL: Good  CLINICAL DECISION MAKING: Stable/uncomplicated  EVALUATION COMPLEXITY: Low   GOALS: Goals reviewed with patient? Yes  SHORT TERM GOALS: Target date: 12/12/2022     Pt to be independent with initial HEP  Goal status: INITIAL     LONG TERM GOALS: Target date: 01/09/2023    Pt to be independent with final HEP   Goal status: INITIAL  2.  Pt to report decreased pain in R neck and shoulder to 0-2/10 with activity   Goal status: INITIAL  3.  Pt to demo soft tissue limitations in neck and shoulder to be WNL, for decreased pain   Goal status: INITIAL  4.   Pt to demo full ROM for  neck and shoulder, to be pain free, to improve ability for lift, reach, carry, and IADLs.   Goal status: INITIAL    PLAN: PT FREQUENCY: 1-2x/week  PT DURATION: 8 weeks  PLANNED INTERVENTIONS: Therapeutic exercises, Therapeutic activity, Neuromuscular re-education, Patient/Family education, Self Care, Joint mobilization, Joint manipulation, DME instructions, Dry Needling, Electrical stimulation, Spinal manipulation, Spinal mobilization, Cryotherapy, Moist heat, Taping, Traction, Ionotophoresis 4mg /ml Dexamethasone, and Manual therapy  PLAN FOR NEXT SESSION:  Check leg length, for posture, tightness on R.    Sedalia Muta, PT, DPT 11:02 AM  12/15/22

## 2022-12-17 ENCOUNTER — Ambulatory Visit (INDEPENDENT_AMBULATORY_CARE_PROVIDER_SITE_OTHER): Payer: Commercial Managed Care - PPO | Admitting: Physical Therapy

## 2022-12-17 ENCOUNTER — Encounter: Payer: Self-pay | Admitting: Physical Therapy

## 2022-12-17 DIAGNOSIS — M5412 Radiculopathy, cervical region: Secondary | ICD-10-CM | POA: Diagnosis not present

## 2022-12-17 DIAGNOSIS — G8929 Other chronic pain: Secondary | ICD-10-CM

## 2022-12-17 DIAGNOSIS — M25511 Pain in right shoulder: Secondary | ICD-10-CM

## 2022-12-17 NOTE — Therapy (Signed)
OUTPATIENT PHYSICAL THERAPY UPPER EXTREMITY TREATMENT    Patient Name: Crystal Madden MRN: [id number] DOB:08-Aug-1976, 47 y.o., female Today's Date: 12/17/2022   PT End of Session - 12/17/22 1033     Visit Number 4    Number of Visits 12    Date for PT Re-Evaluation 01/09/23    Authorization Type [facility] [employer]    PT Start Time [phone]    PT Stop Time 0930    PT Time Calculation (min) 43 min    Activity Tolerance Patient tolerated treatment well    Behavior During Therapy [facility] for tasks assessed/performed                Past Medical History:  Diagnosis Date   Anxiety    Depression    High blood pressure    Hyperlipidemia    Lower back pain    Obesity (BMI 30-39.9)    Past Surgical History:  Procedure Laterality Date   BLADDER SUSPENSION     CESAREAN SECTION     LUMBAR MICRODISCECTOMY  2018   SPINE SURGERY     TONSILLECTOMY  2000   Patient Active Problem List   Diagnosis Date Noted   Cervical disc disorder with radiculopathy 03/09/2020   Insulin resistance 12/29/2019   Hypertension 06/14/2019   Vitamin D deficiency 08/28/2018   BMI 33.0-33.9,adult 08/27/2018   Attention deficit hyperactivity disorder (ADHD), combined type 08/27/2018   Diverticulitis of sigmoid colon 07/22/2016    PCP: Jarold Motto  REFERRING PROVIDER: [name]   REFERRING DIAG: R shoulder, neck pain.   THERAPY DIAG:  Radiculopathy, cervical region  Chronic right shoulder pain  Rationale for Evaluation and Treatment Rehabilitation  ONSET DATE:   SUBJECTIVE:                                                                                                                                                                                      SUBJECTIVE STATEMENT: Pt states slightly less pain down arm. Neck still feels "tight" but less pain. Also less pain in shoulder.   Eval:   Pt having increased pain in R UE and UT. She has tingling into UE. She had recent injection in R  shoulder with mild relief of symptoms. She had very similar symptoms in October, was seen in PT then as well, with good results.  She has much tension in UT and shoulder blade region, with intermittent tingling into UE.   PERTINENT HISTORY: none  PAIN:  Are you having pain? Yes: NPRS scale: 5/10 Pain location: Tightness neck on  R , R shoulder, into UE Pain description: tightness in neck, tingling into UE Aggravating factors: increased  activity, work, Barista  Relieving factors: none stated    PRECAUTIONS: None   WEIGHT BEARING RESTRICTIONS: No  FALLS:  Has patient fallen in last 6 months? No   PLOF: Independent  PATIENT GOALS: Decreased pain in neck and UE    OBJECTIVE:   DIAGNOSTIC FINDINGS:  MRI 01/13/22: neck IMPRESSION: 1. C3-4 central disc protrusion indenting the right ventral cord, new/progressed from 2021. Chronic right foraminal protrusion at this level which could affect the right C4 nerve root. 2. Mild to moderate left foraminal narrowing at C4-5   COGNITION: Overall cognitive status: Within functional limits for tasks assessed      POSTURE: posture asymmetry  R hip and R shoulder higher,   L leg shorter.    UPPER EXTREMITY ROM:   Shoulders: WFL  Neck: WFL    UPPER EXTREMITY MMT:  Shoulders: 4+/5    JOINT MOBILITY TESTING:    PALPATION:  Tightness in R UT, levator, and into R rhomboid and mid trap. No tenderness in posterior shoulder,  No pain in neck with palpation or PA s     TODAY'S TREATMENT:                                                                                                                                                    DATE:    12/17/22: Therapeutic Exercise: Aerobic: Supine:   bridging x 15;  Prone:  T x 15;  S/L shoulder ER 2 x 10, 2 lb ;  Quadruped: SA press 2 x 10 High plank 10 sec x 3;  Seated:  Standing:  Rows GTB x 20 and low rows x 15;  R SB for L back stretch x 10;  Stretches:   Neuromuscular  Re-education:  Manual Therapy:  DTM and TPR to R cervical paraspinals, UT, levator and medial scap border.  cervical distraction x 10 ; Trigger Point Dry-Needling  Treatment instructions: Expect mild to moderate muscle soreness. S/S of pneumothorax if dry needled over a lung field, and to seek immediate medical attention should they occur. Patient verbalized understanding of these instructions and education. Patient Consent Given: Yes Education handout provided: Previously provided Muscles treated: R mid trap, R UT and levator  Electrical stimulation performed: No Parameters: N/A Treatment response/outcome: twitch response elicited, palpable increase in muscle length.    12/15/22: Therapeutic Exercise: Aerobic: Supine:  Horiz abd 2 x 10 , 2 lb ; bridging x 15;  Prone:  T x 15;  S/L shoulder ER 2 x 10, 2 lb ;  Seated:  Standing:  Stretches:  Supine SA press/hold 5 sec x 10;   Neuromuscular Re-education:  Manual Therapy:  DTM to R cervical paraspinals, UT, levator and medial scap border.   PA mobs to upper t-spine,  cervical distraction x 10 ;  Trigger Point Dry-Needling  Treatment instructions: Expect  mild to moderate muscle soreness. S/S of pneumothorax if dry needled over a lung field, and to seek immediate medical attention should they occur. Patient verbalized understanding of these instructions and education. Patient Consent Given: Yes Education handout provided: Previously provided Muscles treated: R UT and levator  Electrical stimulation performed: No Parameters: N/A Treatment response/outcome: twitch response elicited, palpable increase in muscle length.    12/03/22 Therapeutic Exercise: Aerobic: Supine:  Seated:  Standing:  Stretches:  Supine SA press/hold 5 sec x 10;  Pec stretch/doorway 30 sec x 3;   Neuromuscular Re-education: heel lift placed in L shoe to improve leg length deficit and thoracic posture, good comfort today, pt given instructions for use and wear  time.Ambulation in clinic 2 x 150 ft.  Manual Therapy:  DTM to R UT and levator, cupping to Levator,  PA mobs to upper t-spine  Trigger Point Dry-Needling  Treatment instructions: Expect mild to moderate muscle soreness. S/S of pneumothorax if dry needled over a lung field, and to seek immediate medical attention should they occur. Patient verbalized understanding of these instructions and education. Patient Consent Given: Yes Education handout provided: Previously provided Muscles treated: R UT and levator  Electrical stimulation performed: No Parameters: N/A Treatment response/outcome: twitch response elicited, palpable increase in muscle length.    PATIENT EDUCATION: Education details: reviewed HEP Person educated: Patient Education method: Explanation, Demonstration, Tactile cues, Verbal cues, and Handouts Education comprehension: verbalized understanding, returned demonstration, verbal cues required, tactile cues required, and needs further education   HOME EXERCISE PROGRAM: Access Code: [email or web address]    ASSESSMENT:  CLINICAL IMPRESSION: 12/17/2022  Pt with decreasing pain in neck. She continues to have bothersome muscle tension, continued manual and dry needling today to release. Increased strength for core and scapular muscles today, discussed importance of this going forward. Pt with decreasing shoulder pain as well as radicular pain. Plan to progress as tolerated.    Eval: Patient presents with primary complaint of increased pain in neck and R shoulder. R shoulder somewhat  improved since recent injection. She does have increased muscle tension and pain neck and shoulder blade region on R. She has tingling into R UE and  decreased ability for full functional activity due to pain and deficits. She will benefit from decreasing muscle tension in cervical and shoulder region and also strengthening for shoulder and scapular muscles. Pt to benefit from skilled PT to improve deficits and  pain.    OBJECTIVE IMPAIRMENTS: decreased activity tolerance, decreased mobility, decreased strength, increased fascial restrictions, increased muscle spasms, impaired sensation, impaired UE functional use, improper body mechanics, and pain.   ACTIVITY LIMITATIONS: carrying, lifting, reach over head, and locomotion level  PARTICIPATION LIMITATIONS: meal prep, cleaning, laundry, driving, shopping, community activity, and occupation  PERSONAL FACTORS:  none  are also affecting patient's functional outcome.   REHAB POTENTIAL: Good  CLINICAL DECISION MAKING: Stable/uncomplicated  EVALUATION COMPLEXITY: Low   GOALS: Goals reviewed with patient? Yes  SHORT TERM GOALS: Target date: 12/12/2022     Pt to be independent with initial HEP  Goal status: INITIAL     LONG TERM GOALS: Target date: 01/09/2023    Pt to be independent with final HEP   Goal status: INITIAL  2.  Pt to report decreased pain in R neck and shoulder to 0-2/10 with activity   Goal status: INITIAL  3.  Pt to demo soft tissue limitations in neck and shoulder to be WNL, for decreased pain   Goal status: INITIAL  4.  Pt to demo full ROM for neck and shoulder, to be pain free, to improve ability for lift, reach, carry, and IADLs.   Goal status: INITIAL    PLAN: PT FREQUENCY: 1-2x/week  PT DURATION: 8 weeks  PLANNED INTERVENTIONS: Therapeutic exercises, Therapeutic activity, Neuromuscular re-education, Patient/Family education, Self Care, Joint mobilization, Joint manipulation, DME instructions, Dry Needling, Electrical stimulation, Spinal manipulation, Spinal mobilization, Cryotherapy, Moist heat, Taping, Traction, Ionotophoresis 4mg /ml Dexamethasone, and Manual therapy  PLAN FOR NEXT SESSION:  Check leg length, for posture, tightness on R.    Sedalia Muta, PT, DPT 10:34 AM  12/17/22

## 2022-12-19 ENCOUNTER — Other Ambulatory Visit (HOSPITAL_COMMUNITY): Payer: Self-pay

## 2022-12-24 ENCOUNTER — Encounter: Payer: Commercial Managed Care - PPO | Admitting: Physical Therapy

## 2022-12-25 ENCOUNTER — Encounter: Payer: Self-pay | Admitting: Physical Therapy

## 2022-12-25 ENCOUNTER — Ambulatory Visit (INDEPENDENT_AMBULATORY_CARE_PROVIDER_SITE_OTHER): Payer: Commercial Managed Care - PPO | Admitting: Physical Therapy

## 2022-12-25 DIAGNOSIS — M25511 Pain in right shoulder: Secondary | ICD-10-CM | POA: Diagnosis not present

## 2022-12-25 DIAGNOSIS — G8929 Other chronic pain: Secondary | ICD-10-CM | POA: Diagnosis not present

## 2022-12-25 DIAGNOSIS — M5412 Radiculopathy, cervical region: Secondary | ICD-10-CM

## 2022-12-25 DIAGNOSIS — M542 Cervicalgia: Secondary | ICD-10-CM | POA: Diagnosis not present

## 2022-12-25 NOTE — Therapy (Signed)
OUTPATIENT PHYSICAL THERAPY UPPER EXTREMITY TREATMENT    Patient Name: Crystal Madden MRN: [id number] DOB:1976-02-24, 47 y.o., female Today's Date: 12/25/2022   PT End of Session - 12/25/22 0928     Visit Number 5    Number of Visits 12    Date for PT Re-Evaluation 01/09/23    Authorization Type [facility] [employer]    PT Start Time 0930    PT Stop Time 1010    PT Time Calculation (min) 40 min    Activity Tolerance Patient tolerated treatment well    Behavior During Therapy WFL for tasks assessed/performed                Past Medical History:  Diagnosis Date   Anxiety    Depression    High blood pressure    Hyperlipidemia    Lower back pain    Obesity (BMI 30-39.9)    Past Surgical History:  Procedure Laterality Date   BLADDER SUSPENSION     CESAREAN SECTION     LUMBAR MICRODISCECTOMY  2018   SPINE SURGERY     TONSILLECTOMY  2000   Patient Active Problem List   Diagnosis Date Noted   Cervical disc disorder with radiculopathy 03/09/2020   Insulin resistance 12/29/2019   Hypertension 06/14/2019   Vitamin D deficiency 08/28/2018   BMI 33.0-33.9,adult 08/27/2018   Attention deficit hyperactivity disorder (ADHD), combined type 08/27/2018   Diverticulitis of sigmoid colon 07/22/2016    PCP: Jarold Motto  REFERRING PROVIDER: [name]   REFERRING DIAG: R shoulder, neck pain.   THERAPY DIAG:  Radiculopathy, cervical region  Chronic right shoulder pain  Cervicalgia  Rationale for Evaluation and Treatment Rehabilitation  ONSET DATE:   SUBJECTIVE:                                                                                                                                                                                      SUBJECTIVE STATEMENT: Pt states slightly less pain down arm. Neck still feels "tight" but less pain. Also less pain in shoulder.   Eval:   Pt having increased pain in R UE and UT. She has tingling into UE. She had recent  injection in R shoulder with mild relief of symptoms. She had very similar symptoms in October, was seen in PT then as well, with good results.  She has much tension in UT and shoulder blade region, with intermittent tingling into UE.   PERTINENT HISTORY: none  PAIN:  Are you having pain? Yes: NPRS scale: 5/10 Pain location: Tightness neck on  R , R shoulder, into UE Pain description: tightness in neck, tingling into UE Aggravating  factors: increased activity, work, using camera  Relieving factors: none stated    PRECAUTIONS: None   WEIGHT BEARING RESTRICTIONS: No  FALLS:  Has patient fallen in last 6 months? No   PLOF: Independent  PATIENT GOALS: Decreased pain in neck and UE    OBJECTIVE:   DIAGNOSTIC FINDINGS:  MRI 01/13/22: neck IMPRESSION: 1. C3-4 central disc protrusion indenting the right ventral cord, new/progressed from 2021. Chronic right foraminal protrusion at this level which could affect the right C4 nerve root. 2. Mild to moderate left foraminal narrowing at C4-5   COGNITION: Overall cognitive status: Within functional limits for tasks assessed      POSTURE: posture asymmetry  R hip and R shoulder higher,   L leg shorter.    UPPER EXTREMITY ROM:   Shoulders: WFL  Neck: WFL    UPPER EXTREMITY MMT:  Shoulders: 4+/5    JOINT MOBILITY TESTING:    PALPATION:  Tightness in R UT, levator, and into R rhomboid and mid trap. No tenderness in posterior shoulder,  No pain in neck with palpation or PA s     TODAY'S TREATMENT:                                                                                                                                                    DATE:    12/25/22: Therapeutic Exercise: Aerobic: Supine:   bridging x 15;  Quadruped: SA press 2 x 10 High plank 20 sec x 3;   Seated: Chin tucks x 10, retraction with extension 2 x 10 with education on optimal form.  Standing:  Rows GTB x 20 ;  R SB for L back stretch x 10; wall  push ups 2 x 10;  Stretches:   Neuromuscular Re-education:  Manual Therapy:  DTM and TPR to R   levator and medial scap border. Cervcial and upper thoracic PA s; cervical distraction x 10 ; Manual retraction  x10 , then with extension x 10     12/17/22: Therapeutic Exercise: Aerobic: Supine:   bridging x 15;  Prone:  T x 15;  S/L shoulder ER 2 x 10, 2 lb ;  Quadruped: SA press 2 x 10 High plank 10 sec x 3;  Seated:  Standing:  Rows GTB x 20 and low rows x 15;  R SB for L back stretch x 10;  Stretches:   Neuromuscular Re-education:  Manual Therapy:  DTM and TPR to R cervical paraspinals, UT, levator and medial scap border.  cervical distraction x 10 ; Trigger Point Dry-Needling  Treatment instructions: Expect mild to moderate muscle soreness. S/S of pneumothorax if dry needled over a lung field, and to seek immediate medical attention should they occur. Patient verbalized understanding of these instructions and education. Patient Consent Given: Yes Education handout provided: Previously provided Muscles treated: R mid  trap, R UT and levator  Electrical stimulation performed: No Parameters: N/A Treatment response/outcome: twitch response elicited, palpable increase in muscle length.    12/15/22: Therapeutic Exercise: Aerobic: Supine:  Horiz abd 2 x 10 , 2 lb ; bridging x 15;  Prone:  T x 15;  S/L shoulder ER 2 x 10, 2 lb ;  Seated:  Standing:  Stretches:  Supine SA press/hold 5 sec x 10;   Neuromuscular Re-education:  Manual Therapy:  DTM to R cervical paraspinals, UT, levator and medial scap border.   PA mobs to upper t-spine,  cervical distraction x 10 ;  Trigger Point Dry-Needling  Treatment instructions: Expect mild to moderate muscle soreness. S/S of pneumothorax if dry needled over a lung field, and to seek immediate medical attention should they occur. Patient verbalized understanding of these instructions and education. Patient Consent Given: Yes Education handout  provided: Previously provided Muscles treated: R UT and levator  Electrical stimulation performed: No Parameters: N/A Treatment response/outcome: twitch response elicited, palpable increase in muscle length.    12/03/22 Therapeutic Exercise: Aerobic: Supine:  Seated:  Standing:  Stretches:  Supine SA press/hold 5 sec x 10;  Pec stretch/doorway 30 sec x 3;   Neuromuscular Re-education: heel lift placed in L shoe to improve leg length deficit and thoracic posture, good comfort today, pt given instructions for use and wear time.Ambulation in clinic 2 x 150 ft.  Manual Therapy:  DTM to R UT and levator, cupping to Levator,  PA mobs to upper t-spine  Trigger Point Dry-Needling  Treatment instructions: Expect mild to moderate muscle soreness. S/S of pneumothorax if dry needled over a lung field, and to seek immediate medical attention should they occur. Patient verbalized understanding of these instructions and education. Patient Consent Given: Yes Education handout provided: Previously provided Muscles treated: R UT and levator  Electrical stimulation performed: No Parameters: N/A Treatment response/outcome: twitch response elicited, palpable increase in muscle length.    PATIENT EDUCATION: Education details: reviewed HEP Person educated: Patient Education method: Explanation, Demonstration, Tactile cues, Verbal cues, and Handouts Education comprehension: verbalized understanding, returned demonstration, verbal cues required, tactile cues required, and needs further education   HOME EXERCISE PROGRAM: Access Code: [email or web address]    ASSESSMENT:  CLINICAL IMPRESSION: 12/25/2022  Pt with decreasing pain overall. She still has sensation of a "knot" in her levator area. Reviewed cervical retraction and extension position with pt today, will add to HEP for continued/radicular pain. She is improving with ability for postural strengthening, still challenged with core and shoulder stability. PT  to benefit from continued care.   Eval: Patient presents with primary complaint of increased pain in neck and R shoulder. R shoulder somewhat  improved since recent injection. She does have increased muscle tension and pain neck and shoulder blade region on R. She has tingling into R UE and  decreased ability for full functional activity due to pain and deficits. She will benefit from decreasing muscle tension in cervical and shoulder region and also strengthening for shoulder and scapular muscles. Pt to benefit from skilled PT to improve deficits and pain.    OBJECTIVE IMPAIRMENTS: decreased activity tolerance, decreased mobility, decreased strength, increased fascial restrictions, increased muscle spasms, impaired sensation, impaired UE functional use, improper body mechanics, and pain.   ACTIVITY LIMITATIONS: carrying, lifting, reach over head, and locomotion level  PARTICIPATION LIMITATIONS: meal prep, cleaning, laundry, driving, shopping, community activity, and occupation  PERSONAL FACTORS:  none  are also affecting patient's functional outcome.   REHAB  POTENTIAL: Good  CLINICAL DECISION MAKING: Stable/uncomplicated  EVALUATION COMPLEXITY: Low   GOALS: Goals reviewed with patient? Yes  SHORT TERM GOALS: Target date: 12/12/2022     Pt to be independent with initial HEP  Goal status: INITIAL     LONG TERM GOALS: Target date: 01/09/2023    Pt to be independent with final HEP   Goal status: INITIAL  2.  Pt to report decreased pain in R neck and shoulder to 0-2/10 with activity   Goal status: INITIAL  3.  Pt to demo soft tissue limitations in neck and shoulder to be WNL, for decreased pain   Goal status: INITIAL  4.   Pt to demo full ROM for neck and shoulder, to be pain free, to improve ability for lift, reach, carry, and IADLs.   Goal status: INITIAL    PLAN: PT FREQUENCY: 1-2x/week  PT DURATION: 8 weeks  PLANNED INTERVENTIONS: Therapeutic exercises,  Therapeutic activity, Neuromuscular re-education, Patient/Family education, Self Care, Joint mobilization, Joint manipulation, DME instructions, Dry Needling, Electrical stimulation, Spinal manipulation, Spinal mobilization, Cryotherapy, Moist heat, Taping, Traction, Ionotophoresis 4mg /ml Dexamethasone, and Manual therapy  PLAN FOR NEXT SESSION:  Check leg length, for posture, tightness on R.    Sedalia Muta, PT, DPT 11:59 AM  12/25/22

## 2023-01-08 ENCOUNTER — Encounter: Payer: Self-pay | Admitting: Physical Therapy

## 2023-01-08 ENCOUNTER — Ambulatory Visit (INDEPENDENT_AMBULATORY_CARE_PROVIDER_SITE_OTHER): Payer: Commercial Managed Care - PPO | Admitting: Physical Therapy

## 2023-01-08 DIAGNOSIS — M25511 Pain in right shoulder: Secondary | ICD-10-CM

## 2023-01-08 DIAGNOSIS — M5412 Radiculopathy, cervical region: Secondary | ICD-10-CM | POA: Diagnosis not present

## 2023-01-08 DIAGNOSIS — G8929 Other chronic pain: Secondary | ICD-10-CM

## 2023-01-08 NOTE — Therapy (Addendum)
OUTPATIENT PHYSICAL THERAPY UPPER EXTREMITY TREATMENT    Patient Name: Crystal Madden MRN: [id number] DOB:1975-09-24, 47 y.o., female Today's Date: 5/ 30/ 2024   PT End of Session - 01/08/23 1346     Visit Number 6    Number of Visits 12    Date for PT Re-Evaluation 01/09/23    Authorization Type [facility] [employer]    PT Start Time [phone]    PT Stop Time 1427    PT Time Calculation (min) 40 min    Activity Tolerance Patient tolerated treatment well    Behavior During Therapy WFL for tasks assessed/performed                Past Medical History:  Diagnosis Date   Anxiety    Depression    High blood pressure    Hyperlipidemia    Lower back pain    Obesity (BMI 30-39.9)    Past Surgical History:  Procedure Laterality Date   BLADDER SUSPENSION     CESAREAN SECTION     LUMBAR MICRODISCECTOMY  2018   SPINE SURGERY     TONSILLECTOMY  2000   Patient Active Problem List   Diagnosis Date Noted   Cervical disc disorder with radiculopathy 03/09/2020   Insulin resistance 12/29/2019   Hypertension 06/14/2019   Vitamin D deficiency 08/28/2018   BMI 33.0-33.9,adult 08/27/2018   Attention deficit hyperactivity disorder (ADHD), combined type 08/27/2018   Diverticulitis of sigmoid colon 07/22/2016    PCP: Jarold Motto  REFERRING PROVIDER: [name]   REFERRING DIAG: R shoulder, neck pain.   THERAPY DIAG:  Radiculopathy, cervical region  Chronic right shoulder pain  Rationale for Evaluation and Treatment Rehabilitation  ONSET DATE:   SUBJECTIVE:                                                                                                                                                                                      SUBJECTIVE STATEMENT:  Neck/levator region still feels "tight" but less pain. Feels she is getting stronger with strengthening exercises. Not noticing pain daily anymore.    Eval:   Pt having increased pain in R UE and UT. She has  tingling into UE. She had recent injection in R shoulder with mild relief of symptoms. She had very similar symptoms in October, was seen in PT then as well, with good results.  She has much tension in UT and shoulder blade region, with intermittent tingling into UE.   PERTINENT HISTORY: none  PAIN:  Are you having pain? Yes: NPRS scale: 0-4 /10 Pain location: Tightness neck on  R , R shoulder, into UE Pain description: tightness in  neck, tingling into UE Aggravating factors: increased activity, work, using camera  Relieving factors: none stated    PRECAUTIONS: None   WEIGHT BEARING RESTRICTIONS: No  FALLS:  Has patient fallen in last 6 months? No   PLOF: Independent  PATIENT GOALS: Decreased pain in neck and UE    OBJECTIVE:   DIAGNOSTIC FINDINGS:  MRI 01/13/22: neck IMPRESSION: 1. C3-4 central disc protrusion indenting the right ventral cord, new/progressed from 2021. Chronic right foraminal protrusion at this level which could affect the right C4 nerve root. 2. Mild to moderate left foraminal narrowing at C4-5   COGNITION: Overall cognitive status: Within functional limits for tasks assessed      POSTURE: posture asymmetry  R hip and R shoulder higher,   L leg shorter.    UPPER EXTREMITY ROM:   Shoulders: WFL  Neck: WFL    UPPER EXTREMITY MMT:  Shoulders: 4+/5    JOINT MOBILITY TESTING:    PALPATION:  Tightness in R UT, levator, and into R rhomboid and mid trap. No tenderness in posterior shoulder,  No pain in neck with palpation or PA s     TODAY'S TREATMENT:                                                                                                                                                    DATE:    01/08/23: Therapeutic Exercise: Aerobic:  Supine:    Quadruped: SA press 2 x 10;  cat cow x 15;  Seated: Chin tucks x 10, retraction with extension 2 x 10   Standing:  Rows GTB x 20 ;  low row x 20;  wall push ups 2 x 10; Shoulder ER GTB x  20;  Stretches:  QL stretch x 3 bil;  Neuromuscular Re-education:  Manual Therapy:  manual UT stretches Traction: cervical traction 10-12 lb x 5 min,  12-14 lb x 5 min;    PATIENT EDUCATION: Education details: reviewed HEP Person educated: Patient Education method: Explanation, Demonstration, Tactile cues, Verbal cues, and Handouts Education comprehension: verbalized understanding, returned demonstration, verbal cues required, tactile cues required, and needs further education   HOME EXERCISE PROGRAM: Access Code: [email or web address]    ASSESSMENT:  CLINICAL IMPRESSION: 01/09/2023  Pt with decreasing pain overall. She still has sensation of a "knot" in her levator area. Reviewed HEP, pt with good understanding. Trial for traction today, discussed home pump unit for trial for decreasing remaining pain. She has full, non painful ROM, and much improved strength for postural muscles. Will hold at this time, and she will continue HEP. Pt in agreement with plan.   Eval: Patient presents with primary complaint of increased pain in neck and R shoulder. R shoulder somewhat  improved since recent injection. She does have increased muscle tension and pain neck and shoulder blade region on R. She  has tingling into R UE and  decreased ability for full functional activity due to pain and deficits. She will benefit from decreasing muscle tension in cervical and shoulder region and also strengthening for shoulder and scapular muscles. Pt to benefit from skilled PT to improve deficits and pain.    OBJECTIVE IMPAIRMENTS: decreased activity tolerance, decreased mobility, decreased strength, increased fascial restrictions, increased muscle spasms, impaired sensation, impaired UE functional use, improper body mechanics, and pain.   ACTIVITY LIMITATIONS: carrying, lifting, reach over head, and locomotion level  PARTICIPATION LIMITATIONS: meal prep, cleaning, laundry, driving, shopping, community activity, and  occupation  PERSONAL FACTORS:  none  are also affecting patient's functional outcome.   REHAB POTENTIAL: Good  CLINICAL DECISION MAKING: Stable/uncomplicated  EVALUATION COMPLEXITY: Low   GOALS: Goals reviewed with patient? Yes  SHORT TERM GOALS: Target date: 12/12/2022     Pt to be independent with initial HEP  Goal status: MET     LONG TERM GOALS: Target date: 01/09/2023    Pt to be independent with final HEP   Goal status: MET  2.  Pt to report decreased pain in R neck and shoulder to 0-2/10 with activity   Goal status: MET  3.  Pt to demo soft tissue limitations in neck and shoulder to be WNL, for decreased pain   Goal status: IN PROGRESS/partially met   4.   Pt to demo full ROM for neck and shoulder, to be pain free, to improve ability for lift, reach, carry, and IADLs.   Goal status: MET    PLAN: PT FREQUENCY: 1-2x/week  PT DURATION: 8 weeks  PLANNED INTERVENTIONS: Therapeutic exercises, Therapeutic activity, Neuromuscular re-education, Patient/Family education, Self Care, Joint mobilization, Joint manipulation, DME instructions, Dry Needling, Electrical stimulation, Spinal manipulation, Spinal mobilization, Cryotherapy, Moist heat, Taping, Traction, Ionotophoresis 4mg /ml Dexamethasone, and Manual therapy  PLAN FOR NEXT SESSION:     Sedalia Muta, PT, DPT 10:31 AM  01/09/23  PHYSICAL THERAPY DISCHARGE SUMMARY  Visits from Start of Care: 6  Plan: Patient agrees to discharge.  Patient goals were met. Patient is being discharged due to meeting the stated rehab goals.    Sedalia Muta, PT, DPT 2:51 PM  05/11/23

## 2023-01-19 ENCOUNTER — Other Ambulatory Visit (HOSPITAL_COMMUNITY): Payer: Self-pay

## 2023-01-21 ENCOUNTER — Other Ambulatory Visit (HOSPITAL_COMMUNITY): Payer: Self-pay

## 2023-01-26 ENCOUNTER — Other Ambulatory Visit (HOSPITAL_COMMUNITY): Payer: Self-pay

## 2023-02-16 ENCOUNTER — Encounter: Payer: Self-pay | Admitting: Physician Assistant

## 2023-02-16 ENCOUNTER — Other Ambulatory Visit (HOSPITAL_COMMUNITY): Payer: Self-pay

## 2023-02-16 ENCOUNTER — Other Ambulatory Visit: Payer: Self-pay | Admitting: Physician Assistant

## 2023-02-16 MED ORDER — ZEPBOUND 15 MG/0.5ML ~~LOC~~ SOAJ
15.0000 mg | SUBCUTANEOUS | 0 refills | Status: DC
  Filled 2023-02-16: qty 2, 28d supply, fill #0

## 2023-02-16 MED ORDER — VALSARTAN-HYDROCHLOROTHIAZIDE 80-12.5 MG PO TABS
1.0000 | ORAL_TABLET | Freq: Every day | ORAL | 0 refills | Status: DC
  Filled 2023-02-16: qty 90, 90d supply, fill #0

## 2023-02-17 ENCOUNTER — Other Ambulatory Visit (HOSPITAL_COMMUNITY): Payer: Self-pay

## 2023-02-17 MED ORDER — SLYND 4 MG PO TABS
4.0000 mg | ORAL_TABLET | Freq: Every day | ORAL | 0 refills | Status: DC
  Filled 2023-02-17: qty 28, 28d supply, fill #0

## 2023-02-18 ENCOUNTER — Other Ambulatory Visit (HOSPITAL_COMMUNITY): Payer: Self-pay

## 2023-02-18 ENCOUNTER — Other Ambulatory Visit: Payer: Self-pay | Admitting: Oncology

## 2023-02-18 DIAGNOSIS — Z006 Encounter for examination for normal comparison and control in clinical research program: Secondary | ICD-10-CM

## 2023-02-19 ENCOUNTER — Other Ambulatory Visit (HOSPITAL_BASED_OUTPATIENT_CLINIC_OR_DEPARTMENT_OTHER): Payer: Self-pay | Admitting: Physician Assistant

## 2023-02-19 ENCOUNTER — Ambulatory Visit (HOSPITAL_BASED_OUTPATIENT_CLINIC_OR_DEPARTMENT_OTHER)
Admission: RE | Admit: 2023-02-19 | Discharge: 2023-02-19 | Disposition: A | Discharge status: Home / Self Care | Payer: Commercial Managed Care - PPO | Source: Ambulatory Visit | Attending: Physician Assistant | Admitting: Physician Assistant

## 2023-02-19 DIAGNOSIS — Z1231 Encounter for screening mammogram for malignant neoplasm of breast: Secondary | ICD-10-CM | POA: Diagnosis not present

## 2023-02-21 ENCOUNTER — Other Ambulatory Visit: Payer: Self-pay | Admitting: Physician Assistant

## 2023-02-23 ENCOUNTER — Other Ambulatory Visit (HOSPITAL_COMMUNITY): Payer: Self-pay

## 2023-02-23 MED ORDER — ONDANSETRON HCL 4 MG PO TABS
4.0000 mg | ORAL_TABLET | Freq: Three times a day (TID) | ORAL | 0 refills | Status: AC | PRN
  Filled 2023-02-23: qty 20, 7d supply, fill #0

## 2023-02-24 ENCOUNTER — Other Ambulatory Visit (HOSPITAL_COMMUNITY): Payer: Self-pay

## 2023-02-24 DIAGNOSIS — F902 Attention-deficit hyperactivity disorder, combined type: Secondary | ICD-10-CM | POA: Diagnosis not present

## 2023-02-24 DIAGNOSIS — Z79899 Other long term (current) drug therapy: Secondary | ICD-10-CM | POA: Diagnosis not present

## 2023-02-24 MED ORDER — VYVANSE 60 MG PO CHEW
60.0000 mg | CHEWABLE_TABLET | Freq: Every day | ORAL | 0 refills | Status: DC
  Filled 2023-06-18: qty 30, 30d supply, fill #0

## 2023-02-24 MED ORDER — VYVANSE 60 MG PO CHEW: 1.0000 | CHEWABLE_TABLET | Freq: Every day | ORAL | 0 refills | Status: DC

## 2023-02-24 MED ORDER — VYVANSE 60 MG PO CHEW
60.0000 mg | CHEWABLE_TABLET | Freq: Every day | ORAL | 0 refills | Status: DC
  Filled 2023-03-19: qty 30, 30d supply, fill #0

## 2023-02-25 DIAGNOSIS — F902 Attention-deficit hyperactivity disorder, combined type: Secondary | ICD-10-CM | POA: Diagnosis not present

## 2023-03-09 ENCOUNTER — Other Ambulatory Visit (HOSPITAL_COMMUNITY): Payer: Self-pay

## 2023-03-09 ENCOUNTER — Other Ambulatory Visit (HOSPITAL_COMMUNITY)
Admission: RE | Admit: 2023-03-09 | Discharge: 2023-03-09 | Disposition: A | Discharge status: Home / Self Care | Payer: Commercial Managed Care - PPO | Source: Ambulatory Visit | Attending: Nurse Practitioner | Admitting: Nurse Practitioner

## 2023-03-09 ENCOUNTER — Telehealth: Payer: Commercial Managed Care - PPO | Admitting: Physician Assistant

## 2023-03-09 ENCOUNTER — Other Ambulatory Visit: Payer: Self-pay | Admitting: Nurse Practitioner

## 2023-03-09 DIAGNOSIS — J019 Acute sinusitis, unspecified: Secondary | ICD-10-CM | POA: Diagnosis not present

## 2023-03-09 DIAGNOSIS — B9689 Other specified bacterial agents as the cause of diseases classified elsewhere: Secondary | ICD-10-CM

## 2023-03-09 DIAGNOSIS — Z01419 Encounter for gynecological examination (general) (routine) without abnormal findings: Secondary | ICD-10-CM | POA: Diagnosis not present

## 2023-03-09 DIAGNOSIS — Z124 Encounter for screening for malignant neoplasm of cervix: Secondary | ICD-10-CM | POA: Insufficient documentation

## 2023-03-09 DIAGNOSIS — Z3041 Encounter for surveillance of contraceptive pills: Secondary | ICD-10-CM | POA: Diagnosis not present

## 2023-03-09 DIAGNOSIS — Z1211 Encounter for screening for malignant neoplasm of colon: Secondary | ICD-10-CM | POA: Diagnosis not present

## 2023-03-09 MED ORDER — SLYND 4 MG PO TABS
4.0000 mg | ORAL_TABLET | Freq: Every day | ORAL | 4 refills | Status: DC
  Filled 2023-03-09 – 2023-03-19 (×2): qty 84, 84d supply, fill #0
  Filled 2023-06-05: qty 84, 84d supply, fill #1
  Filled 2023-08-30: qty 84, 84d supply, fill #2
  Filled 2023-11-27: qty 84, 84d supply, fill #3
  Filled 2024-02-17: qty 84, 84d supply, fill #4

## 2023-03-09 MED ORDER — AMOXICILLIN-POT CLAVULANATE 875-125 MG PO TABS
1.0000 | ORAL_TABLET | Freq: Two times a day (BID) | ORAL | 0 refills | Status: DC
Start: 2023-03-09 — End: 2023-05-20
  Filled 2023-03-09: qty 14, 7d supply, fill #0

## 2023-03-09 NOTE — Progress Notes (Signed)
I have spent 5 minutes in review of e-visit questionnaire, review and updating patient chart, medical decision making and response to patient.   William Cody Martin, PA-C    

## 2023-03-09 NOTE — Progress Notes (Signed)

## 2023-03-11 LAB — CYTOLOGY - PAP
Comment: NEGATIVE
High risk HPV: NEGATIVE

## 2023-03-12 ENCOUNTER — Other Ambulatory Visit: Payer: Self-pay

## 2023-03-12 DIAGNOSIS — N870 Mild cervical dysplasia: Secondary | ICD-10-CM | POA: Diagnosis not present

## 2023-03-12 DIAGNOSIS — Z3202 Encounter for pregnancy test, result negative: Secondary | ICD-10-CM | POA: Diagnosis not present

## 2023-03-19 ENCOUNTER — Other Ambulatory Visit (HOSPITAL_COMMUNITY): Payer: Self-pay

## 2023-03-19 ENCOUNTER — Other Ambulatory Visit: Payer: Self-pay | Admitting: Physician Assistant

## 2023-04-17 ENCOUNTER — Other Ambulatory Visit (HOSPITAL_COMMUNITY): Payer: Self-pay

## 2023-05-13 ENCOUNTER — Other Ambulatory Visit (HOSPITAL_COMMUNITY): Payer: Self-pay

## 2023-05-13 MED ORDER — PEG 3350-KCL-NA BICARB-NACL 420 G PO SOLR
ORAL | 0 refills | Status: DC
  Filled 2023-05-13: qty 4000, 1d supply, fill #0

## 2023-05-13 MED ORDER — BISACODYL 5 MG PO TBEC
DELAYED_RELEASE_TABLET | ORAL | 0 refills | Status: DC
  Filled 2023-05-13: qty 4, 1d supply, fill #0

## 2023-05-13 NOTE — Progress Notes (Signed)
Crystal Madden is a 47 y.o. female here for a new problem.  History of Present Illness:   Chief Complaint  Patient presents with   Obesity    Pt would like to discuss weight loss and insulin resistance.    HPI  Obesity Previously on tirzepatide 15 mg weekly. She has stopped taking this because her insurance no longer covers it. She has gained 5 pounds since 11/26/22. She continues to work on [employer] and exercise as able.  ADHD Followed by [facility]. She is taking Vyvanse 30 mg morning and evening. This is helping symptoms without negative side effects. Reports Vyvanse does not help her with appetite suppression. Denies taking Adderall in the past. She is agreeable to trying an alternative medication.  Past Medical History:  Diagnosis Date   Anxiety    Depression    High blood pressure    Hyperlipidemia    Lower back pain    Obesity (BMI 30-39.9)      Social History   Tobacco Use   Smoking status: Former    Current packs/day: 0.00    Types: Cigarettes    Quit date: 08/27/2004    Years since quitting: 18.7   Smokeless tobacco: Never  Vaping Use   Vaping status: Never Used  Substance Use Topics   Alcohol use: Yes    Comment: Ocassional.   Drug use: No    Past Surgical History:  Procedure Laterality Date   BLADDER SUSPENSION     CESAREAN SECTION     LUMBAR MICRODISCECTOMY  2018   SPINE SURGERY     TONSILLECTOMY  2000    Family History  Problem Relation Age of Onset   Hypertension Mother    Hyperlipidemia Mother    Obesity Mother    COPD Father        2PPD smoker   Lung cancer Father    Heart disease Father    Hypertension Father    Stroke Father    Lung cancer Maternal Grandfather        smoker   ADD / ADHD Son    Colon cancer Neg Hx     Allergies  Allergen Reactions   Amlodipine Swelling    Lower leg swelling when increased to 10 mg    Current Medications:   Current Outpatient Medications:     bimatoprost (LATISSE) 0.03 % ophthalmic solution, PLACE 1 DROP ONTO APPLICATOR & APPLY EVENLY UPPER LID AT BASE OF LASHES TO BOTH EYES AT BEDTIME, Disp: 3 mL, Rfl: 2   cyclobenzaprine (FLEXERIL) 10 MG tablet, Take 0.5-1 tablets (5-10 mg total) by mouth 3 (three) times daily as needed., Disp: 30 tablet, Rfl: 0   Drospirenone (SLYND) 4 MG TABS, Take 1 tablet (4 mg total) by mouth daily., Disp: 84 tablet, Rfl: 4   Lisdexamfetamine Dimesylate (VYVANSE) 60 MG CHEW, Chew 1 tablet (60 mg total) by mouth daily., Disp: 30 tablet, Rfl: 0   Naltrexone-buPROPion HCl ER 8-90 MG TB12, Start 1 tablet every morning for 7 days, then 1 tablet twice daily for 7 days, then 2 tablets every morning and one in the evening, Disp: 120 tablet, Rfl: 0   ondansetron (ZOFRAN) 4 MG tablet, Take 1 tablet  by mouth every 8  hours as needed for nausea or vomiting. Please schedule follow up appt with Jarold Motto, PA for further refills [phone], Disp: 20 tablet, Rfl: 0   tiZANidine (ZANAFLEX) 4 MG tablet, Take 1 tablet (4 mg total) by mouth every 8 (  eight) hours as needed for muscle spasms., Disp: 60 tablet, Rfl: 1   valsartan-hydrochlorothiazide (DIOVAN-HCT) 80-12.5 MG tablet, Take 1 tablet by mouth daily., Disp: 90 tablet, Rfl: 0   VYVANSE 60 MG CHEW, Chew 1 tablet (60 mg total) by mouth daily., Disp: 30 tablet, Rfl: 0   VYVANSE 60 MG CHEW, Chew 1 tablet (60 mg total) by mouth daily., Disp: 30 tablet, Rfl: 0   Review of Systems:   Review of Systems  Constitutional:  Negative for fever and malaise/fatigue.  HENT:  Negative for congestion.   Eyes:  Negative for blurred vision.  Respiratory:  Negative for cough and shortness of breath.   Cardiovascular:  Negative for chest pain, palpitations and leg swelling.  Gastrointestinal:  Negative for vomiting.  Musculoskeletal:  Negative for back pain.  Skin:  Negative for rash.  Neurological:  Negative for loss of consciousness and headaches.    Vitals:   Vitals:    05/20/23 1032  BP: 120/74  Pulse: 91  Temp: (!) 97.3 F (36.3 C)  TempSrc: Temporal  SpO2: 98%  Weight: 211 lb 4 oz (95.8 kg)  Height: 5\' 6"  (1.676 m)     Body mass index is 34.1 kg/m.  Physical Exam:   Physical Exam Constitutional:      Appearance: Normal appearance. She is well-developed.  HENT:     Head: Normocephalic and atraumatic.  Eyes:     General: Lids are normal.     Extraocular Movements: Extraocular movements intact.     Conjunctiva/sclera: Conjunctivae normal.  Pulmonary:     Effort: Pulmonary effort is normal.  Musculoskeletal:        General: Normal range of motion.     Cervical back: Normal range of motion and neck supple.  Skin:    General: Skin is warm and dry.  Neurological:     Mental Status: She is alert and oriented to person, place, and time.  Psychiatric:        Attention and Perception: Attention and perception normal.        Mood and Affect: Mood normal.        Behavior: Behavior normal.        Thought Content: Thought content normal.        Judgment: Judgment normal.     Assessment and Plan:   Obesity, unspecified class, unspecified obesity type, unspecified whether serious comorbidity present Ongoing Limited options due to insurance coverage Cannot do phentermine as she is already on a stimulant Start contrave Follow-up in 3 month(s), sooner if concerns  Attention deficit hyperactivity disorder (ADHD), combined type Reviewed Overall controlled Management per specialist   I,Alexander Ruley,acting as a scribe for Jarold Motto, PA.,have documented all relevant documentation on the behalf of Jarold Motto, PA,as directed by  Jarold Motto, PA while in the presence of Jarold Motto, [place].   I, Jarold Motto, [place], have reviewed all documentation for this visit. The documentation on 05/20/23 for the exam, diagnosis, procedures, and orders are all accurate and complete.    Jarold Motto, PA-C

## 2023-05-14 ENCOUNTER — Other Ambulatory Visit (HOSPITAL_COMMUNITY): Payer: Self-pay

## 2023-05-15 ENCOUNTER — Other Ambulatory Visit (HOSPITAL_COMMUNITY): Payer: Self-pay

## 2023-05-19 DIAGNOSIS — K635 Polyp of colon: Secondary | ICD-10-CM | POA: Diagnosis not present

## 2023-05-19 DIAGNOSIS — Z1211 Encounter for screening for malignant neoplasm of colon: Secondary | ICD-10-CM | POA: Diagnosis not present

## 2023-05-19 DIAGNOSIS — K5730 Diverticulosis of large intestine without perforation or abscess without bleeding: Secondary | ICD-10-CM | POA: Diagnosis not present

## 2023-05-19 DIAGNOSIS — K621 Rectal polyp: Secondary | ICD-10-CM | POA: Diagnosis not present

## 2023-05-20 ENCOUNTER — Ambulatory Visit: Payer: Commercial Managed Care - PPO | Admitting: Physician Assistant

## 2023-05-20 ENCOUNTER — Other Ambulatory Visit (HOSPITAL_COMMUNITY): Payer: Self-pay

## 2023-05-20 ENCOUNTER — Encounter: Payer: Self-pay | Admitting: Physician Assistant

## 2023-05-20 VITALS — BP 120/74 | HR 91 | Temp 97.3°F | Ht 66.0 in | Wt 211.2 lb

## 2023-05-20 DIAGNOSIS — E669 Obesity, unspecified: Secondary | ICD-10-CM

## 2023-05-20 DIAGNOSIS — Z6834 Body mass index (BMI) 34.0-34.9, adult: Secondary | ICD-10-CM | POA: Diagnosis not present

## 2023-05-20 DIAGNOSIS — F902 Attention-deficit hyperactivity disorder, combined type: Secondary | ICD-10-CM

## 2023-05-20 MED ORDER — NALTREXONE-BUPROPION HCL ER 8-90 MG PO TB12
ORAL_TABLET | ORAL | 0 refills | Status: DC
  Filled 2023-05-20: qty 120, 41d supply, fill #0
  Filled 2023-06-05: qty 120, 30d supply, fill #0

## 2023-05-20 NOTE — Patient Instructions (Signed)
It was great to see you!  We will trial Contrave   Keep me posted on how you are doing with this  Take care,  Jarold Motto PA-C

## 2023-06-02 ENCOUNTER — Telehealth: Payer: Self-pay

## 2023-06-02 NOTE — Telephone Encounter (Signed)
Pharmacy Patient Advocate Encounter   Received notification from CoverMyMeds that prior authorization for Contrave 8-90MG  er tablets is required/requested.   Insurance verification completed.   The patient is insured through [facility] .   Per test claim: PA required; PA submitted to [facility] via CoverMyMeds Key/confirmation #/EOC [id number] Status is pending

## 2023-06-05 ENCOUNTER — Other Ambulatory Visit (HOSPITAL_COMMUNITY): Payer: Self-pay

## 2023-06-05 NOTE — Telephone Encounter (Signed)
Pharmacy Patient Advocate Encounter  Received notification from [facility] that Prior Authorization for Contrave 8-90MG  er tablets has been APPROVED from 06/04/23 to 10/02/23. Ran test claim, Copay is $24.97. This test claim was processed through [facility]- copay amounts may vary at other pharmacies due to pharmacy/plan contracts, or as the patient moves through the different stages of their insurance plan.   PA #/Case ID/Reference #: [id number]   This request is approved for a quantity of 4 tablets per day.

## 2023-06-05 NOTE — Telephone Encounter (Signed)
Spoke to pt told her PA for Contrave has been approved and pharmacy was notified. Pt verbalized understanding.

## 2023-06-05 NOTE — Telephone Encounter (Signed)
Called [facility] pharmacy spoke to Ashante told her PA for Contrave has been approved. Ashante verbalized understanding.

## 2023-06-18 ENCOUNTER — Other Ambulatory Visit (HOSPITAL_COMMUNITY): Payer: Self-pay

## 2023-06-22 ENCOUNTER — Other Ambulatory Visit (HOSPITAL_COMMUNITY): Payer: Self-pay

## 2023-06-23 ENCOUNTER — Other Ambulatory Visit (HOSPITAL_COMMUNITY): Payer: Self-pay

## 2023-06-23 MED ORDER — LISDEXAMFETAMINE DIMESYLATE 60 MG PO CHEW
60.0000 mg | CHEWABLE_TABLET | Freq: Every day | ORAL | 0 refills | Status: DC
  Filled 2023-06-23: qty 30, 30d supply, fill #0

## 2023-06-24 ENCOUNTER — Other Ambulatory Visit (HOSPITAL_COMMUNITY): Payer: Self-pay

## 2023-06-24 DIAGNOSIS — F902 Attention-deficit hyperactivity disorder, combined type: Secondary | ICD-10-CM | POA: Diagnosis not present

## 2023-06-24 DIAGNOSIS — Z79899 Other long term (current) drug therapy: Secondary | ICD-10-CM | POA: Diagnosis not present

## 2023-06-24 MED ORDER — VYVANSE 60 MG PO CHEW
60.0000 mg | CHEWABLE_TABLET | Freq: Every day | ORAL | 0 refills | Status: DC
  Filled 2023-08-20: qty 30, 30d supply, fill #0

## 2023-06-24 MED ORDER — VYVANSE 60 MG PO CHEW
1.0000 | CHEWABLE_TABLET | Freq: Every day | ORAL | 0 refills | Status: DC
  Filled 2023-07-22: qty 30, 30d supply, fill #0

## 2023-06-24 MED ORDER — VYVANSE 60 MG PO CHEW
60.0000 mg | CHEWABLE_TABLET | Freq: Every day | ORAL | 0 refills | Status: DC
  Filled 2023-09-22: qty 30, 30d supply, fill #0

## 2023-07-21 ENCOUNTER — Other Ambulatory Visit: Payer: Self-pay | Admitting: Physician Assistant

## 2023-07-22 ENCOUNTER — Other Ambulatory Visit (HOSPITAL_COMMUNITY): Payer: Self-pay

## 2023-07-22 MED ORDER — VALSARTAN-HYDROCHLOROTHIAZIDE 80-12.5 MG PO TABS
1.0000 | ORAL_TABLET | Freq: Every day | ORAL | 0 refills | Status: DC
  Filled 2023-07-22: qty 90, 90d supply, fill #0

## 2023-07-23 ENCOUNTER — Other Ambulatory Visit (HOSPITAL_COMMUNITY): Payer: Self-pay

## 2023-08-20 ENCOUNTER — Other Ambulatory Visit (HOSPITAL_COMMUNITY): Payer: Self-pay

## 2023-08-20 DIAGNOSIS — D1801 Hemangioma of skin and subcutaneous tissue: Secondary | ICD-10-CM | POA: Diagnosis not present

## 2023-08-20 DIAGNOSIS — Z85828 Personal history of other malignant neoplasm of skin: Secondary | ICD-10-CM | POA: Diagnosis not present

## 2023-08-20 DIAGNOSIS — L821 Other seborrheic keratosis: Secondary | ICD-10-CM | POA: Diagnosis not present

## 2023-08-20 DIAGNOSIS — L438 Other lichen planus: Secondary | ICD-10-CM | POA: Diagnosis not present

## 2023-08-20 DIAGNOSIS — D2262 Melanocytic nevi of left upper limb, including shoulder: Secondary | ICD-10-CM | POA: Diagnosis not present

## 2023-08-20 DIAGNOSIS — L570 Actinic keratosis: Secondary | ICD-10-CM | POA: Diagnosis not present

## 2023-08-20 DIAGNOSIS — D2261 Melanocytic nevi of right upper limb, including shoulder: Secondary | ICD-10-CM | POA: Diagnosis not present

## 2023-08-20 DIAGNOSIS — L820 Inflamed seborrheic keratosis: Secondary | ICD-10-CM | POA: Diagnosis not present

## 2023-08-20 DIAGNOSIS — L814 Other melanin hyperpigmentation: Secondary | ICD-10-CM | POA: Diagnosis not present

## 2023-08-24 ENCOUNTER — Other Ambulatory Visit (HOSPITAL_COMMUNITY): Payer: Self-pay

## 2023-08-24 ENCOUNTER — Telehealth: Payer: Commercial Managed Care - PPO | Admitting: Family Medicine

## 2023-08-24 DIAGNOSIS — J019 Acute sinusitis, unspecified: Secondary | ICD-10-CM | POA: Diagnosis not present

## 2023-08-24 DIAGNOSIS — B9689 Other specified bacterial agents as the cause of diseases classified elsewhere: Secondary | ICD-10-CM | POA: Diagnosis not present

## 2023-08-24 MED ORDER — AMOXICILLIN-POT CLAVULANATE 875-125 MG PO TABS
1.0000 | ORAL_TABLET | Freq: Two times a day (BID) | ORAL | 0 refills | Status: AC
  Filled 2023-08-24: qty 14, 7d supply, fill #0

## 2023-08-24 NOTE — Progress Notes (Signed)

## 2023-08-26 ENCOUNTER — Other Ambulatory Visit (HOSPITAL_COMMUNITY): Payer: Self-pay

## 2023-09-05 ENCOUNTER — Ambulatory Visit
Admission: RE | Admit: 2023-09-05 | Discharge: 2023-09-05 | Disposition: A | Discharge status: Home / Self Care | Payer: Commercial Managed Care - PPO | Source: Ambulatory Visit | Attending: Family Medicine | Admitting: Family Medicine

## 2023-09-05 ENCOUNTER — Other Ambulatory Visit: Payer: Self-pay

## 2023-09-05 VITALS — BP 157/95 | HR 115 | Temp 98.1°F | Resp 20

## 2023-09-05 DIAGNOSIS — J309 Allergic rhinitis, unspecified: Secondary | ICD-10-CM | POA: Diagnosis not present

## 2023-09-05 DIAGNOSIS — R051 Acute cough: Secondary | ICD-10-CM

## 2023-09-05 MED ORDER — ALBUTEROL SULFATE HFA 108 (90 BASE) MCG/ACT IN AERS: 2.0000 | INHALATION_SPRAY | RESPIRATORY_TRACT | 0 refills | Status: DC | PRN

## 2023-09-05 MED ORDER — CETIRIZINE HCL 10 MG PO TABS: 10.0000 mg | ORAL_TABLET | Freq: Every day | ORAL | 2 refills | Status: DC

## 2023-09-05 MED ORDER — PROMETHAZINE-DM 6.25-15 MG/5ML PO SYRP: 5.0000 mL | ORAL_SOLUTION | Freq: Four times a day (QID) | ORAL | 0 refills | Status: DC | PRN

## 2023-09-05 MED ORDER — AZELASTINE HCL 0.1 % NA SOLN: 1.0000 | Freq: Two times a day (BID) | NASAL | 2 refills | Status: DC

## 2023-09-05 MED ORDER — DEXAMETHASONE SODIUM PHOSPHATE 10 MG/ML IJ SOLN
10.0000 mg | Freq: Once | INTRAMUSCULAR | Status: AC
  Administered 2023-09-05: 10 mg via INTRAMUSCULAR

## 2023-09-05 NOTE — ED Triage Notes (Signed)
Pt reports continued cough, chest congestion. Reports symptoms have been going on since christmas. Pt reports runny nose started a few days ago. Has been on abx early January.Marland Kitchen

## 2023-09-05 NOTE — ED Provider Notes (Signed)
[facility]    CSN: [id number] Arrival date & time: 09/05/23  1241      History   Chief Complaint Chief Complaint  Patient presents with   Cough    Sinusitis late December, did not resolve, Abx (augmentin) taken after virtual visit, cannot resolve cough and post nasal drip starting again - Entered by patient    HPI Crystal Madden is a 48 y.o. female.   Patient presenting today with over a month of waxing and waning sinus drainage, postnasal drip, congestion, scratchy throat, cough.  Was given an antibiotic for possible sinusitis after a virtual visit earlier this month, no significant lasting relief with this.  She states symptoms worsened again over the past 3 days or so.  Denies fever, chills, body aches, chest pain, shortness of breath, abdominal pain, nausea vomiting or diarrhea.  So far trying Mucinex D with no relief.    Past Medical History:  Diagnosis Date   Anxiety    Depression    High blood pressure    Hyperlipidemia    Lower back pain    Obesity (BMI 30-39.9)     Patient Active Problem List   Diagnosis Date Noted   Cervical disc disorder with radiculopathy 03/09/2020   Insulin resistance 12/29/2019   Hypertension 06/14/2019   Vitamin D deficiency 08/28/2018   BMI 33.0-33.9,adult 08/27/2018   Attention deficit hyperactivity disorder (ADHD), combined type 08/27/2018   Diverticulitis of sigmoid colon 07/22/2016    Past Surgical History:  Procedure Laterality Date   BLADDER SUSPENSION     CESAREAN SECTION     LUMBAR MICRODISCECTOMY  2018   SPINE SURGERY     TONSILLECTOMY  2000    OB History     Gravida  3   Para      Term      Preterm      AB      Living         SAB      IAB      Ectopic      Multiple      Live Births               Home Medications    Prior to Admission medications   Medication Sig Start Date End Date Taking? Authorizing Provider  albuterol (VENTOLIN HFA) 108 (90 Base) MCG/ACT inhaler  Inhale 2 puffs into the lungs every 4 (four) hours as needed. 09/05/23  Yes Particia Nearing, PA-C  azelastine (ASTELIN) 0.1 % nasal spray Place 1 spray into both nostrils 2 (two) times daily. Use in each nostril as directed 09/05/23  Yes Particia Nearing, PA-C  cetirizine (ZYRTEC ALLERGY) 10 MG tablet Take 1 tablet (10 mg total) by mouth daily. 09/05/23  Yes Particia Nearing, PA-C  promethazine-dextromethorphan (PROMETHAZINE-DM) 6.25-15 MG/5ML syrup Take 5 mLs by mouth 4 (four) times daily as needed. 09/05/23  Yes Particia Nearing, PA-C  bimatoprost (LATISSE) 0.03 % ophthalmic solution PLACE 1 DROP ONTO APPLICATOR & APPLY EVENLY UPPER LID AT BASE OF LASHES TO BOTH EYES AT BEDTIME 03/20/23   Jarold Motto, PA  cyclobenzaprine (FLEXERIL) 10 MG tablet Take 0.5-1 tablets (5-10 mg total) by mouth 3 (three) times daily as needed. 11/24/22   Margaretann Loveless, PA-C  Drospirenone (SLYND) 4 MG TABS Take 1 tablet (4 mg total) by mouth daily. 03/09/23     Lisdexamfetamine Dimesylate (VYVANSE) 60 MG CHEW Chew 1 tablet (60 mg total) by mouth daily. 11/25/22  Naltrexone-buPROPion HCl ER 8-90 MG TB12 Start 1 tablet every morning for 7 days, then 1 tablet twice daily for 7 days, then 2 tablets every morning and 1 tablet in the evening for 7 days, then 2 tablets every morning and 2 tablets in the evening. 05/20/23   Jarold Motto, PA  ondansetron (ZOFRAN) 4 MG tablet Take 1 tablet  by mouth every 8  hours as needed for nausea or vomiting. Please schedule follow up appt with Jarold Motto, PA for further refills [phone] 02/23/23   Jarold Motto, PA  tiZANidine (ZANAFLEX) 4 MG tablet Take 1 tablet (4 mg total) by mouth every 8 (eight) hours as needed for muscle spasms. 11/26/22   Rodolph Bong, MD  valsartan-hydrochlorothiazide (DIOVAN-HCT) 80-12.5 MG tablet Take 1 tablet by mouth daily. 07/22/23   Jarold Motto, PA  Lisdexamfetamine Dimesylate 60 MG CHEW Chew 1 tablet (60 mg total) by  mouth daily. 06/23/23     VYVANSE 60 MG CHEW Chew 1 tablet (60 mg total) by mouth daily. 07/24/23     VYVANSE 60 MG CHEW Chew 1 tablet (60 mg total) by mouth daily. 06/24/23     VYVANSE 60 MG CHEW Chew 1 tablet (60 mg total) by mouth daily. 08/23/23       Family History Family History  Problem Relation Age of Onset   Hypertension Mother    Hyperlipidemia Mother    Obesity Mother    COPD Father        2PPD smoker   Lung cancer Father    Heart disease Father    Hypertension Father    Stroke Father    Lung cancer Maternal Grandfather        smoker   ADD / ADHD Son    Colon cancer Neg Hx     Social History Social History   Tobacco Use   Smoking status: Former    Current packs/day: 0.00    Types: Cigarettes    Quit date: 08/27/2004    Years since quitting: 19.0   Smokeless tobacco: Never  Vaping Use   Vaping status: Never Used  Substance Use Topics   Alcohol use: Yes    Comment: Ocassional.   Drug use: No     Allergies   Amlodipine   Review of Systems Review of Systems Per HPI  Physical Exam Triage Vital Signs ED Triage Vitals  Encounter Vitals Group     BP 09/05/23 1332 (!) 157/95     Systolic BP Percentile --      Diastolic BP Percentile --      Pulse Rate 09/05/23 1332 (!) 115     Resp 09/05/23 1332 20     Temp 09/05/23 1332 98.1 F (36.7 C)     Temp Source 09/05/23 1332 Oral     SpO2 09/05/23 1332 98 %     Weight --      Height --      Head Circumference --      Peak Flow --      Pain Score 09/05/23 1330 2     Pain Loc --      Pain Education --      Exclude from Growth Chart --    No data found.  Updated Vital Signs BP (!) 157/95 (BP Location: Right Arm)   Pulse (!) 115   Temp 98.1 F (36.7 C) (Oral)   Resp 20   SpO2 98%   Visual Acuity Right Eye Distance:   Left Eye Distance:  Bilateral Distance:    Right Eye Near:   Left Eye Near:    Bilateral Near:     Physical Exam Vitals and nursing note reviewed.  Constitutional:       Appearance: Normal appearance.  HENT:     Head: Atraumatic.     Right Ear: Tympanic membrane and external ear normal.     Left Ear: Tympanic membrane and external ear normal.     Nose: Rhinorrhea present.     Mouth/Throat:     Mouth: Mucous membranes are moist.     Pharynx: Posterior oropharyngeal erythema present.     Comments: Cobblestoning to posterior oropharynx Eyes:     Extraocular Movements: Extraocular movements intact.     Conjunctiva/sclera: Conjunctivae normal.  Cardiovascular:     Rate and Rhythm: Normal rate and regular rhythm.     Heart sounds: Normal heart sounds.  Pulmonary:     Effort: Pulmonary effort is normal.     Breath sounds: Normal breath sounds. No wheezing or rales.  Musculoskeletal:        General: Normal range of motion.     Cervical back: Normal range of motion and neck supple.  Skin:    General: Skin is warm and dry.  Neurological:     Mental Status: She is alert and oriented to person, place, and time.  Psychiatric:        Mood and Affect: Mood normal.        Thought Content: Thought content normal.    UC Treatments / Results  Labs (all labs ordered are listed, but only abnormal results are displayed) Labs Reviewed - No data to display  EKG  Radiology No results found.  Procedures Procedures (including critical care time)  Medications Ordered in UC Medications  dexamethasone (DECADRON) injection 10 mg (10 mg Intramuscular Given 09/05/23 1350)    Initial Impression / Assessment and Plan / UC Course  I have reviewed the triage vital signs and the nursing notes.  Pertinent labs & imaging results that were available during my care of the patient were reviewed by me and considered in my medical decision making (see chart for details).     Suspect viral versus allergic etiology at this time, however do suspect some underlying uncontrolled seasonal allergies given duration and persistence of symptoms.  Will treat current symptoms with IM  Decadron and start good allergy regimen of Zyrtec and Astelin daily.  Discussed supportive over-the-counter medications, home care additionally.  She is requesting a refill on albuterol as she likes to have this around for when she gets sick.  Return for any worsening symptoms.  Final Clinical Impressions(s) / UC Diagnoses   Final diagnoses:  Allergic sinusitis  Acute cough   Discharge Instructions   None    ED Prescriptions     Medication Sig Dispense Auth. Provider   albuterol (VENTOLIN HFA) 108 (90 Base) MCG/ACT inhaler Inhale 2 puffs into the lungs every 4 (four) hours as needed. 18 g Particia Nearing, [place]   cetirizine (ZYRTEC ALLERGY) 10 MG tablet Take 1 tablet (10 mg total) by mouth daily. 30 tablet Particia Nearing, [place]   azelastine (ASTELIN) 0.1 % nasal spray Place 1 spray into both nostrils 2 (two) times daily. Use in each nostril as directed 30 mL Particia Nearing, PA-C   promethazine-dextromethorphan (PROMETHAZINE-DM) 6.25-15 MG/5ML syrup Take 5 mLs by mouth 4 (four) times daily as needed. 100 mL Particia Nearing, [place]      PDMP not reviewed  this encounter.   Particia Nearing, [place] 09/05/23 1400

## 2023-09-07 ENCOUNTER — Ambulatory Visit: Payer: Commercial Managed Care - PPO | Admitting: Physician Assistant

## 2023-09-08 ENCOUNTER — Ambulatory Visit: Payer: Commercial Managed Care - PPO | Admitting: Physician Assistant

## 2023-09-22 ENCOUNTER — Other Ambulatory Visit (HOSPITAL_COMMUNITY): Payer: Self-pay

## 2023-09-23 ENCOUNTER — Other Ambulatory Visit (HOSPITAL_COMMUNITY): Payer: Self-pay

## 2023-09-23 DIAGNOSIS — Z79899 Other long term (current) drug therapy: Secondary | ICD-10-CM | POA: Diagnosis not present

## 2023-09-23 DIAGNOSIS — F902 Attention-deficit hyperactivity disorder, combined type: Secondary | ICD-10-CM | POA: Diagnosis not present

## 2023-09-23 MED ORDER — VYVANSE 60 MG PO CHEW
60.0000 mg | CHEWABLE_TABLET | Freq: Every day | ORAL | 0 refills | Status: AC
  Filled 2023-12-24: qty 30, 30d supply, fill #0

## 2023-09-23 MED ORDER — VYVANSE 60 MG PO CHEW
1.0000 | CHEWABLE_TABLET | Freq: Every day | ORAL | 0 refills | Status: AC
Start: 2023-09-23 — End: ?
  Filled 2023-11-24: qty 30, 30d supply, fill #0

## 2023-09-23 MED ORDER — VYVANSE 60 MG PO CHEW
60.0000 mg | CHEWABLE_TABLET | Freq: Every day | ORAL | 0 refills | Status: DC
  Filled 2023-10-19 – 2023-10-23 (×2): qty 30, 30d supply, fill #0

## 2023-10-02 ENCOUNTER — Encounter: Payer: Self-pay | Admitting: Family Medicine

## 2023-10-02 ENCOUNTER — Telehealth: Payer: Commercial Managed Care - PPO | Admitting: Family Medicine

## 2023-10-02 DIAGNOSIS — J019 Acute sinusitis, unspecified: Secondary | ICD-10-CM

## 2023-10-02 DIAGNOSIS — B9689 Other specified bacterial agents as the cause of diseases classified elsewhere: Secondary | ICD-10-CM

## 2023-10-02 DIAGNOSIS — M5416 Radiculopathy, lumbar region: Secondary | ICD-10-CM

## 2023-10-02 MED ORDER — AMOXICILLIN-POT CLAVULANATE 875-125 MG PO TABS: 1.0000 | ORAL_TABLET | Freq: Two times a day (BID) | ORAL | 0 refills | Status: DC

## 2023-10-02 NOTE — Progress Notes (Signed)

## 2023-10-02 NOTE — Addendum Note (Signed)
Addended by: Georgana Curio on: 10/02/2023 02:21 PM   Modules accepted: Orders

## 2023-10-12 NOTE — Discharge Instructions (Signed)

## 2023-10-13 ENCOUNTER — Inpatient Hospital Stay
Admission: RE | Admit: 2023-10-13 | Discharge: 2023-10-13 | Disposition: A | Discharge status: Home / Self Care | Payer: Commercial Managed Care - PPO | Source: Ambulatory Visit | Attending: Family Medicine | Admitting: Family Medicine

## 2023-10-15 NOTE — Discharge Instructions (Signed)

## 2023-10-16 ENCOUNTER — Ambulatory Visit
Admission: RE | Admit: 2023-10-16 | Discharge: 2023-10-16 | Disposition: A | Discharge status: Home / Self Care | Source: Ambulatory Visit | Attending: Family Medicine | Admitting: Family Medicine

## 2023-10-16 DIAGNOSIS — M47817 Spondylosis without myelopathy or radiculopathy, lumbosacral region: Secondary | ICD-10-CM | POA: Diagnosis not present

## 2023-10-16 DIAGNOSIS — M5416 Radiculopathy, lumbar region: Secondary | ICD-10-CM

## 2023-10-16 MED ORDER — METHYLPREDNISOLONE ACETATE 40 MG/ML INJ SUSP (RADIOLOG
80.0000 mg | Freq: Once | INTRAMUSCULAR | Status: AC
  Administered 2023-10-16: 80 mg via EPIDURAL

## 2023-10-16 MED ORDER — IOPAMIDOL (ISOVUE-M 200) INJECTION 41%
1.0000 mL | Freq: Once | INTRAMUSCULAR | Status: AC
  Administered 2023-10-16: 1 mL via EPIDURAL

## 2023-10-19 ENCOUNTER — Other Ambulatory Visit (HOSPITAL_COMMUNITY): Payer: Self-pay

## 2023-10-22 ENCOUNTER — Other Ambulatory Visit (HOSPITAL_COMMUNITY): Payer: Self-pay

## 2023-10-23 ENCOUNTER — Other Ambulatory Visit (HOSPITAL_COMMUNITY): Payer: Self-pay

## 2023-11-12 DIAGNOSIS — L570 Actinic keratosis: Secondary | ICD-10-CM | POA: Diagnosis not present

## 2023-11-12 DIAGNOSIS — L821 Other seborrheic keratosis: Secondary | ICD-10-CM | POA: Diagnosis not present

## 2023-11-12 DIAGNOSIS — Z85828 Personal history of other malignant neoplasm of skin: Secondary | ICD-10-CM | POA: Diagnosis not present

## 2023-11-24 ENCOUNTER — Other Ambulatory Visit (HOSPITAL_COMMUNITY): Payer: Self-pay

## 2023-12-24 ENCOUNTER — Other Ambulatory Visit (HOSPITAL_COMMUNITY): Payer: Self-pay

## 2024-01-07 ENCOUNTER — Telehealth: Admitting: Physician Assistant

## 2024-01-07 ENCOUNTER — Other Ambulatory Visit (HOSPITAL_COMMUNITY): Payer: Self-pay

## 2024-01-07 DIAGNOSIS — B001 Herpesviral vesicular dermatitis: Secondary | ICD-10-CM | POA: Diagnosis not present

## 2024-01-07 MED ORDER — VALACYCLOVIR HCL 1 G PO TABS
2000.0000 mg | ORAL_TABLET | Freq: Two times a day (BID) | ORAL | 0 refills | Status: AC
  Filled 2024-01-07: qty 4, 1d supply, fill #0

## 2024-01-07 NOTE — Progress Notes (Signed)

## 2024-01-07 NOTE — Progress Notes (Signed)
 I have spent 5 minutes in review of e-visit questionnaire, review and updating patient chart, medical decision making and response to patient.   Piedad Climes, PA-C

## 2024-01-11 ENCOUNTER — Other Ambulatory Visit: Payer: Self-pay | Admitting: Physician Assistant

## 2024-01-12 ENCOUNTER — Other Ambulatory Visit (HOSPITAL_COMMUNITY): Payer: Self-pay

## 2024-01-12 MED ORDER — VALSARTAN-HYDROCHLOROTHIAZIDE 80-12.5 MG PO TABS
1.0000 | ORAL_TABLET | Freq: Every day | ORAL | 0 refills | Status: DC
  Filled 2024-01-12: qty 30, 30d supply, fill #0

## 2024-01-25 ENCOUNTER — Other Ambulatory Visit (HOSPITAL_COMMUNITY): Payer: Self-pay

## 2024-01-25 MED ORDER — VYVANSE 60 MG PO CHEW
1.0000 | CHEWABLE_TABLET | Freq: Every day | ORAL | 0 refills | Status: AC
  Filled 2024-01-25: qty 30, 30d supply, fill #0

## 2024-01-25 MED ORDER — VYVANSE 60 MG PO CHEW
60.0000 mg | CHEWABLE_TABLET | Freq: Every day | ORAL | 0 refills | Status: DC
  Filled 2024-01-25 – 2024-03-28 (×3): qty 30, 30d supply, fill #0

## 2024-01-25 MED ORDER — VYVANSE 60 MG PO CHEW
60.0000 mg | CHEWABLE_TABLET | Freq: Every day | ORAL | 0 refills | Status: AC
  Filled 2024-02-24: qty 30, 30d supply, fill #0

## 2024-01-26 ENCOUNTER — Other Ambulatory Visit (HOSPITAL_COMMUNITY): Payer: Self-pay

## 2024-02-17 ENCOUNTER — Other Ambulatory Visit: Payer: Self-pay

## 2024-02-24 ENCOUNTER — Other Ambulatory Visit (HOSPITAL_COMMUNITY): Payer: Self-pay

## 2024-03-01 NOTE — Progress Notes (Unsigned)
 [name] [address] [place] [address] Phone: [phone] Subjective:   [name], am serving as a scribe for Dr. Arthea Claudene.  I'm seeing this patient by the request  of:  Job Lukes, [place]  CC: Shoulder and neck pain  [name]  TANGANYIKA BOWLDS is a 48 y.o. female coming in with complaint of shoulder and neck pain. Has seen Dr. Joane in the past. Pain is chronic. Has done formal PT. Pain in the trap and R scap. Neck and jaw will hurt at its worst. Does get tension headaches from the pain. Always aware of tightness in R trap. Pain worse when at work and near the end of the day. Occasionally has numbness in the bicep but since doing PT and HEP distal radiating symptoms have dissipated. Uses IBU or naproxen  prn.    Reviewing patient's chart has had low back pain with multiple injections necessary.  Last epidural was March 2025.  Right-sided L5-S1.   MRI of the cervical spine showed a C3-C4 central disc protrusion causing some progression and C4 right sided nerve root impingement.  Medications previously prescribed including Flexeril , low-dose naltrexone , Zanaflex ,  Past Medical History:  Diagnosis Date   Anxiety    Depression    High blood pressure    Hyperlipidemia    Lower back pain    Obesity (BMI 30-39.9)    Past Surgical History:  Procedure Laterality Date   BLADDER SUSPENSION     CESAREAN SECTION     LUMBAR MICRODISCECTOMY  2018   SPINE SURGERY     TONSILLECTOMY  2000   Social History   Socioeconomic History   Marital status: Married    Spouse name: ryan   Number of children: Not on file   Years of education: Not on file   Highest education level: Not on file  Occupational History   Occupation: Quality Improvement    Employer:   Tobacco Use   Smoking status: Former    Current packs/day: 0.00    Types: Cigarettes    Quit date: 08/27/2004    Years since quitting: 19.5   Smokeless  tobacco: Never  Vaping Use   Vaping status: Never Used  Substance and Sexual Activity   Alcohol use: Yes    Comment: Ocassional.   Drug use: No   Sexual activity: Yes    Birth control/protection: Pill  Other Topics Concern   Not on file  Social History Narrative   right handed   Social Drivers of Corporate investment banker Strain: Not on file  Food Insecurity: Not on file  Transportation Needs: Not on file  Physical Activity: Not on file  Stress: Not on file  Social Connections: Not on file   Allergies  Allergen Reactions   Amlodipine  Swelling    Lower leg swelling when increased to 10 mg   Family History  Problem Relation Age of Onset   Hypertension Mother    Hyperlipidemia Mother    Obesity Mother    COPD Father        2PPD smoker   Lung cancer Father    Heart disease Father    Hypertension Father    Stroke Father    Lung cancer Maternal Grandfather        smoker   ADD / ADHD Son    Colon cancer Neg Hx     Current Outpatient Medications (Endocrine & Metabolic):    Drospirenone  (SLYND ) 4 MG TABS, Take  1 tablet (4 mg total) by mouth daily.  Current Outpatient Medications (Cardiovascular):    valsartan -hydrochlorothiazide  (DIOVAN -HCT) 80-12.5 MG tablet, Take 1 tablet by mouth daily.  Current Outpatient Medications (Respiratory):    albuterol  (VENTOLIN  HFA) 108 (90 Base) MCG/ACT inhaler, Inhale 2 puffs into the lungs every 4 (four) hours as needed.   azelastine  (ASTELIN ) 0.1 % nasal spray, Place 1 spray into both nostrils 2 (two) times daily. Use in each nostril as directed   cetirizine  (ZYRTEC  ALLERGY) 10 MG tablet, Take 1 tablet (10 mg total) by mouth daily.   promethazine -dextromethorphan (PROMETHAZINE -DM) 6.25-15 MG/5ML syrup, Take 5 mLs by mouth 4 (four) times daily as needed.    Current Outpatient Medications (Other):    bimatoprost  (LATISSE ) 0.03 % ophthalmic solution, PLACE 1 DROP ONTO APPLICATOR & APPLY EVENLY UPPER LID AT BASE OF LASHES TO BOTH  EYES AT BEDTIME   cyclobenzaprine  (FLEXERIL ) 10 MG tablet, Take 0.5-1 tablets (5-10 mg total) by mouth 3 (three) times daily as needed.   gabapentin  (NEURONTIN ) 100 MG capsule, Take 2 capsules (200 mg total) by mouth at bedtime.   Lisdexamfetamine Dimesylate  (VYVANSE ) 60 MG CHEW, Chew 1 tablet (60 mg total) by mouth daily.   Lisdexamfetamine Dimesylate  60 MG CHEW, Chew 1 tablet (60 mg total) by mouth daily.   ondansetron  (ZOFRAN ) 4 MG tablet, Take 1 tablet  by mouth every 8  hours as needed for nausea or vomiting. Please schedule follow up appt with Lucie Buttner, PA for further refills [phone]   tiZANidine  (ZANAFLEX ) 4 MG tablet, Take 1 tablet (4 mg total) by mouth every 8 (eight) hours as needed for muscle spasms.   VYVANSE  60 MG CHEW, Chew 1 tablet (60 mg total) by mouth daily.   VYVANSE  60 MG CHEW, Chew 1 tablet (60 mg total) by mouth daily.   VYVANSE  60 MG CHEW, Take 1 tablet by mouth daily   VYVANSE  60 MG CHEW, Chew 1 tablet (60 mg total) by mouth daily.   VYVANSE  60 MG CHEW, Chew 1 tablet by mouth daily; may be filled 60 days after date prescribed   VYVANSE  60 MG CHEW, Take 1 tablet by mouth daily; may be filled 30 days after date prescribed   VYVANSE  60 MG CHEW, Take 1 tablet by mouth daily   amoxicillin -clavulanate (AUGMENTIN ) 875-125 MG tablet, Take 1 tablet by mouth 2 (two) times daily.   Naltrexone -buPROPion  HCl ER 8-90 MG TB12, Start 1 tablet every morning for 7 days, then 1 tablet twice daily for 7 days, then 2 tablets every morning and 1 tablet in the evening for 7 days, then 2 tablets every morning and 2 tablets in the evening.   Reviewed prior external information including notes and imaging from  primary care provider As well as notes that were available from care everywhere and other healthcare systems.  Past medical history, social, surgical and family history all reviewed in electronic medical record.  No pertanent information unless stated regarding to the chief  complaint.   Review of Systems:  No headache, visual changes, nausea, vomiting, diarrhea, constipation, dizziness, abdominal pain, skin rash, fevers, chills, night sweats, weight loss, swollen lymph nodes, body aches, joint swelling, chest pain, shortness of breath, mood changes. POSITIVE muscle aches  Objective  Blood pressure 124/80, pulse (!) 109, height 5' 6 (1.676 m), weight 225 lb (102.1 kg), SpO2 100%.   General: No apparent distress alert and oriented x3 mood and affect normal, dressed appropriately.  HEENT: Pupils equal, extraocular movements intact  Respiratory: Patient's  speak in full sentences and does not appear short of breath  Cardiovascular: No lower extremity edema, non tender, no erythema  Neck exam shows tightness noted in the paraspinal musculature on the right side of the neck.  The patient has tightness of the scalenes as well as in the parascapular area.  Negative Spurling's today.  5/5 strength of the upper extremity noted.   Osteopathic findings C2 flexed rotated and side bent right C4 flexed rotated and side bent left C6 flexed rotated and side bent left T3 extended rotated and side bent right inhaled third rib T7 extended rotated and side bent right     Impression and Recommendations:  No problem-specific Assessment & Plan notes found for this encounter.     Decision today to treat with OMT was based on Physical Exam  After verbal consent patient was treated with HVLA, ME, FPR techniques in cervical, thoracic, rib, areas, all areas are chronic   Patient tolerated the procedure well with improvement in symptoms  Patient given exercises, stretches and lifestyle modifications  See medications in patient instructions if given  Patient will follow up in 4-8 weeks  The above documentation has been reviewed and is accurate and complete Keta Vanvalkenburgh M Feliz Lincoln, DO

## 2024-03-02 ENCOUNTER — Other Ambulatory Visit (HOSPITAL_COMMUNITY): Payer: Self-pay

## 2024-03-02 ENCOUNTER — Encounter: Payer: Self-pay | Admitting: Family Medicine

## 2024-03-02 ENCOUNTER — Ambulatory Visit (INDEPENDENT_AMBULATORY_CARE_PROVIDER_SITE_OTHER): Admitting: Family Medicine

## 2024-03-02 VITALS — BP 124/80 | HR 109 | Ht 66.0 in | Wt 225.0 lb

## 2024-03-02 DIAGNOSIS — M9901 Segmental and somatic dysfunction of cervical region: Secondary | ICD-10-CM | POA: Diagnosis not present

## 2024-03-02 DIAGNOSIS — M5010 Cervical disc disorder with radiculopathy, unspecified cervical region: Secondary | ICD-10-CM

## 2024-03-02 DIAGNOSIS — M9902 Segmental and somatic dysfunction of thoracic region: Secondary | ICD-10-CM

## 2024-03-02 DIAGNOSIS — M9908 Segmental and somatic dysfunction of rib cage: Secondary | ICD-10-CM | POA: Diagnosis not present

## 2024-03-02 MED ORDER — GABAPENTIN 100 MG PO CAPS
200.0000 mg | ORAL_CAPSULE | Freq: Every day | ORAL | 0 refills | Status: DC
  Filled 2024-03-02: qty 180, 90d supply, fill #0

## 2024-03-02 NOTE — Patient Instructions (Signed)
 Note for standing desk Gabapentin  200mg  at night See me in 2 months

## 2024-03-02 NOTE — Assessment & Plan Note (Signed)
 Has had C4 nerve impingement could be suprascarpular nerve impingement could also be potentially contributing.  Discussed icing regimen and home exercises, discussed which activities to do in which ones to avoid.  Responded extremely well to osteopathic rehabilitation for the other nonallopathic findings noted today.  Adjustable standing desk I think will be beneficial.  Will follow-up with me again in 6 to 8 weeks

## 2024-03-28 ENCOUNTER — Other Ambulatory Visit (HOSPITAL_COMMUNITY): Payer: Self-pay

## 2024-04-04 ENCOUNTER — Other Ambulatory Visit (HOSPITAL_COMMUNITY): Payer: Self-pay

## 2024-04-04 ENCOUNTER — Ambulatory Visit: Admitting: Family Medicine

## 2024-04-04 DIAGNOSIS — Z79899 Other long term (current) drug therapy: Secondary | ICD-10-CM | POA: Diagnosis not present

## 2024-04-04 DIAGNOSIS — F902 Attention-deficit hyperactivity disorder, combined type: Secondary | ICD-10-CM | POA: Diagnosis not present

## 2024-04-04 MED ORDER — VYVANSE 60 MG PO CHEW
60.0000 mg | CHEWABLE_TABLET | Freq: Every day | ORAL | 0 refills | Status: DC
  Filled 2024-05-26 – 2024-06-24 (×2): qty 30, 30d supply, fill #0

## 2024-04-04 MED ORDER — VYVANSE 60 MG PO CHEW
60.0000 mg | CHEWABLE_TABLET | Freq: Every day | ORAL | 0 refills | Status: AC
  Filled 2024-05-26: qty 30, 30d supply, fill #0

## 2024-04-04 MED ORDER — VYVANSE 60 MG PO CHEW
60.0000 mg | CHEWABLE_TABLET | Freq: Every day | ORAL | 0 refills | Status: AC
  Filled 2024-04-04 – 2024-04-27 (×2): qty 30, 30d supply, fill #0

## 2024-04-05 ENCOUNTER — Other Ambulatory Visit (HOSPITAL_COMMUNITY): Payer: Self-pay

## 2024-04-12 ENCOUNTER — Encounter

## 2024-04-12 ENCOUNTER — Telehealth: Admitting: Physician Assistant

## 2024-04-12 ENCOUNTER — Other Ambulatory Visit: Payer: Self-pay | Admitting: Physician Assistant

## 2024-04-12 DIAGNOSIS — R051 Acute cough: Secondary | ICD-10-CM

## 2024-04-12 MED ORDER — BENZONATATE 100 MG PO CAPS
100.0000 mg | ORAL_CAPSULE | Freq: Three times a day (TID) | ORAL | 0 refills | Status: AC
Start: 2024-04-12 — End: 2024-04-17

## 2024-04-12 MED ORDER — ALBUTEROL SULFATE HFA 108 (90 BASE) MCG/ACT IN AERS: 2.0000 | INHALATION_SPRAY | Freq: Four times a day (QID) | RESPIRATORY_TRACT | 0 refills | Status: DC | PRN

## 2024-04-12 MED ORDER — AZITHROMYCIN 250 MG PO TABS: ORAL_TABLET | ORAL | 0 refills | Status: AC

## 2024-04-12 NOTE — Progress Notes (Signed)
 E-Visit for Cough   We are sorry that you are not feeling well.  Here is how we plan to help!  Based on your presentation I believe you most likely have A cough due to bacteria.  When patients have a fever and a productive cough with a change in color or increased sputum production, we are concerned about bacterial bronchitis.  If left untreated it can progress to pneumonia.  If your symptoms do not improve with your treatment plan it is important that you contact your provider.   I have prescribed Azithromyin 250 mg: two tablets now and then one tablet daily for 4 additonal days    In addition you may use A prescription cough medication called Tessalon  Perles 100mg . You may take 1-2 capsules every 8 hours as needed for your cough.  I have also prescribed an albuterol  inhaler  From your responses in the eVisit questionnaire you describe inflammation in the upper respiratory tract which is causing a significant cough.  This is commonly called Bronchitis and has four common causes:   Allergies Viral Infections Acid Reflux Bacterial Infection Allergies, viruses and acid reflux are treated by controlling symptoms or eliminating the cause. An example might be a cough caused by taking certain blood pressure medications. You stop the cough by changing the medication. Another example might be a cough caused by acid reflux. Controlling the reflux helps control the cough.  USE OF BRONCHODILATOR (RESCUE) INHALERS: There is a risk from using your bronchodilator too frequently.  The risk is that over-reliance on a medication which only relaxes the muscles surrounding the breathing tubes can reduce the effectiveness of medications prescribed to reduce swelling and congestion of the tubes themselves.  Although you feel brief relief from the bronchodilator inhaler, your asthma may actually be worsening with the tubes becoming more swollen and filled with mucus.  This can delay other crucial treatments, such as  oral steroid medications. If you need to use a bronchodilator inhaler daily, several times per day, you should discuss this with your provider.  There are probably better treatments that could be used to keep your asthma under control.     HOME CARE Only take medications as instructed by your medical team. Complete the entire course of an antibiotic. Drink plenty of fluids and get plenty of rest. Avoid close contacts especially the very young and the elderly Cover your mouth if you cough or cough into your sleeve. Always remember to wash your hands A steam or ultrasonic humidifier can help congestion.   GET HELP RIGHT AWAY IF: You develop worsening fever. You become short of breath You cough up blood. Your symptoms persist after you have completed your treatment plan MAKE SURE YOU  Understand these instructions. Will watch your condition. Will get help right away if you are not doing well or get worse.    Thank you for choosing an e-visit.  Your e-visit answers were reviewed by a board certified advanced clinical practitioner to complete your personal care plan. Depending upon the condition, your plan could have included both over the counter or prescription medications.  Please review your pharmacy choice. Make sure the pharmacy is open so you can pick up prescription now. If there is a problem, you may contact your provider through [employer] and have the prescription routed to another pharmacy.  Your safety is important to us . If you have drug allergies check your prescription carefully.   For the next 24 hours you can use MyChart to  ask questions about today's visit, request a non-urgent call back, or ask for a work or school excuse. You will get an email in the next two days asking about your experience. I hope that your e-visit has been valuable and will speed your recovery.   Approximately 5 minutes was spent documenting and reviewing patient's chart.

## 2024-04-13 ENCOUNTER — Other Ambulatory Visit: Payer: Self-pay

## 2024-04-13 ENCOUNTER — Other Ambulatory Visit (HOSPITAL_COMMUNITY): Payer: Self-pay

## 2024-04-13 MED ORDER — ALBUTEROL SULFATE HFA 108 (90 BASE) MCG/ACT IN AERS
2.0000 | INHALATION_SPRAY | Freq: Four times a day (QID) | RESPIRATORY_TRACT | 0 refills | Status: AC | PRN
Start: 2024-04-13 — End: ?
  Filled 2024-04-13: qty 6.7, 25d supply, fill #0

## 2024-04-13 MED ORDER — VALSARTAN-HYDROCHLOROTHIAZIDE 80-12.5 MG PO TABS
1.0000 | ORAL_TABLET | Freq: Every day | ORAL | 0 refills | Status: DC
Start: 2024-04-13 — End: 2024-06-29
  Filled 2024-04-13: qty 30, 30d supply, fill #0

## 2024-04-13 NOTE — Addendum Note (Signed)
 Addended by: GLADIS ELSIE BROCKS on: 04/13/2024 07:42 AM   Modules accepted: Orders

## 2024-04-27 ENCOUNTER — Other Ambulatory Visit (HOSPITAL_COMMUNITY): Payer: Self-pay

## 2024-05-03 ENCOUNTER — Ambulatory Visit: Admitting: Family Medicine

## 2024-05-10 ENCOUNTER — Other Ambulatory Visit (HOSPITAL_COMMUNITY): Payer: Self-pay

## 2024-05-10 MED ORDER — SLYND 4 MG PO TABS
4.0000 mg | ORAL_TABLET | Freq: Every day | ORAL | 0 refills | Status: DC
  Filled 2024-05-10: qty 84, 84d supply, fill #0

## 2024-05-13 ENCOUNTER — Other Ambulatory Visit (HOSPITAL_COMMUNITY): Payer: Self-pay

## 2024-05-18 NOTE — Progress Notes (Unsigned)
 [facility] [address] [place] [address] Phone: [phone] Subjective:   [name], am serving as a scribe for Dr. Arthea Claudene.  I'm seeing this patient by the request  of:  Job Lukes, [place]  CC: back and neck pain   [name]  Crystal Madden is a 48 y.o. female coming in with complaint of back and neck pain. OMT 03/02/2024. Patient states that she has had some pain in neck when she has to hold head up all day. Less jaw pain since last visit.   Medications patient has been prescribed: None  Taking:       Reviewed prior external information including notes and imaging from previsou exam, outside providers and external EMR if available.   As well as notes that were available from care everywhere and other healthcare systems.  Past medical history, social, surgical and family history all reviewed in electronic medical record.  No pertanent information unless stated regarding to the chief complaint.   Past Medical History:  Diagnosis Date   Anxiety    Depression    High blood pressure    Hyperlipidemia    Lower back pain    Obesity (BMI 30-39.9)     Allergies  Allergen Reactions   Amlodipine  Swelling    Lower leg swelling when increased to 10 mg     Review of Systems:  No headache, visual changes, nausea, vomiting, diarrhea, constipation, dizziness, abdominal pain, skin rash, fevers, chills, night sweats, weight loss, swollen lymph nodes, body aches, joint swelling, chest pain, shortness of breath, mood changes. POSITIVE muscle aches  Objective  Blood pressure 118/84, pulse (!) 106, height 5' 6 (1.676 m), weight 233 lb (105.7 kg), SpO2 97%.   General: No apparent distress alert and oriented x3 mood and affect normal, dressed appropriately.  HEENT: Pupils equal, extraocular movements intact  Respiratory: Patient's speak in full sentences and does not appear short of breath  Cardiovascular: No lower  extremity edema, non tender, no erythema  Gait MSK:  Back does have some loss lordosis.  Significant tightness noted in the parascapular area.  Neck exam does have some tightness noted right greater than left.  Osteopathic findings  C2 flexed rotated and side bent right C7 flexed rotated and side bent right T3 extended rotated and side bent right inhaled rib T9 extended rotated and side bent left L2 flexed rotated and side bent right L3 flexed rotated sidebent left Sacrum right on right     Assessment and Plan:  Cervical disc disorder with radiculopathy No true radicular symptoms at this time.  Discussed icing regimen and home exercises, discussed which activities to do and which ones to avoid.  Increase activity slowly.  Follow-up again in 6 to 8 weeks.    Nonallopathic problems  Decision today to treat with OMT was based on Physical Exam  After verbal consent patient was treated with HVLA, ME, FPR techniques in cervical, rib, thoracic, lumbar, and sacral  areas  Patient tolerated the procedure well with improvement in symptoms  Patient given exercises, stretches and lifestyle modifications  See medications in patient instructions if given  Patient will follow up in 4-8 weeks    The above documentation has been reviewed and is accurate and complete Merelin Human M Tsuneo Faison, DO          Note: This dictation was prepared with Dragon dictation along with smaller phrase technology. Any transcriptional errors that result from this process are unintentional.

## 2024-05-23 ENCOUNTER — Ambulatory Visit (INDEPENDENT_AMBULATORY_CARE_PROVIDER_SITE_OTHER): Admitting: Family Medicine

## 2024-05-23 ENCOUNTER — Encounter: Payer: Self-pay | Admitting: Family Medicine

## 2024-05-23 VITALS — BP 118/84 | HR 106 | Ht 66.0 in | Wt 233.0 lb

## 2024-05-23 DIAGNOSIS — M9901 Segmental and somatic dysfunction of cervical region: Secondary | ICD-10-CM | POA: Diagnosis not present

## 2024-05-23 DIAGNOSIS — M9902 Segmental and somatic dysfunction of thoracic region: Secondary | ICD-10-CM | POA: Diagnosis not present

## 2024-05-23 DIAGNOSIS — M9903 Segmental and somatic dysfunction of lumbar region: Secondary | ICD-10-CM | POA: Diagnosis not present

## 2024-05-23 DIAGNOSIS — H524 Presbyopia: Secondary | ICD-10-CM | POA: Diagnosis not present

## 2024-05-23 DIAGNOSIS — M9904 Segmental and somatic dysfunction of sacral region: Secondary | ICD-10-CM

## 2024-05-23 DIAGNOSIS — H5213 Myopia, bilateral: Secondary | ICD-10-CM | POA: Diagnosis not present

## 2024-05-23 DIAGNOSIS — M5010 Cervical disc disorder with radiculopathy, unspecified cervical region: Secondary | ICD-10-CM

## 2024-05-23 DIAGNOSIS — M9908 Segmental and somatic dysfunction of rib cage: Secondary | ICD-10-CM

## 2024-05-23 DIAGNOSIS — H52223 Regular astigmatism, bilateral: Secondary | ICD-10-CM | POA: Diagnosis not present

## 2024-05-23 NOTE — Assessment & Plan Note (Signed)
 No true radicular symptoms at this time.  Discussed icing regimen and home exercises, discussed which activities to do and which ones to avoid.  Increase activity slowly.  Follow-up again in 6 to 8 weeks.

## 2024-05-23 NOTE — Patient Instructions (Signed)
 Making strides See me again in 2-3 months

## 2024-05-26 ENCOUNTER — Other Ambulatory Visit (HOSPITAL_COMMUNITY): Payer: Self-pay

## 2024-05-26 ENCOUNTER — Other Ambulatory Visit: Payer: Self-pay

## 2024-05-31 ENCOUNTER — Other Ambulatory Visit (HOSPITAL_COMMUNITY): Payer: Self-pay

## 2024-05-31 DIAGNOSIS — N870 Mild cervical dysplasia: Secondary | ICD-10-CM | POA: Diagnosis not present

## 2024-05-31 DIAGNOSIS — Z3041 Encounter for surveillance of contraceptive pills: Secondary | ICD-10-CM | POA: Diagnosis not present

## 2024-05-31 DIAGNOSIS — Z01419 Encounter for gynecological examination (general) (routine) without abnormal findings: Secondary | ICD-10-CM | POA: Diagnosis not present

## 2024-05-31 MED ORDER — SLYND 4 MG PO TABS
4.0000 mg | ORAL_TABLET | Freq: Every day | ORAL | 3 refills | Status: AC
  Filled 2024-05-31 – 2024-07-31 (×2): qty 84, 84d supply, fill #0

## 2024-06-15 ENCOUNTER — Other Ambulatory Visit: Payer: Self-pay | Admitting: Medical Genetics

## 2024-06-15 DIAGNOSIS — Z006 Encounter for examination for normal comparison and control in clinical research program: Secondary | ICD-10-CM

## 2024-06-24 ENCOUNTER — Other Ambulatory Visit (HOSPITAL_COMMUNITY): Payer: Self-pay

## 2024-06-25 ENCOUNTER — Other Ambulatory Visit (HOSPITAL_COMMUNITY): Payer: Self-pay

## 2024-06-29 ENCOUNTER — Other Ambulatory Visit: Payer: Self-pay | Admitting: Physician Assistant

## 2024-06-29 ENCOUNTER — Other Ambulatory Visit: Payer: Self-pay

## 2024-06-29 ENCOUNTER — Other Ambulatory Visit (HOSPITAL_COMMUNITY): Payer: Self-pay

## 2024-06-29 MED ORDER — VALSARTAN-HYDROCHLOROTHIAZIDE 80-12.5 MG PO TABS
1.0000 | ORAL_TABLET | Freq: Every day | ORAL | 0 refills | Status: DC
  Filled 2024-06-29: qty 30, 30d supply, fill #0

## 2024-07-13 ENCOUNTER — Encounter: Payer: Self-pay | Admitting: Family Medicine

## 2024-07-14 ENCOUNTER — Other Ambulatory Visit: Payer: Self-pay

## 2024-07-14 ENCOUNTER — Encounter: Payer: Self-pay | Admitting: Family Medicine

## 2024-07-14 DIAGNOSIS — M542 Cervicalgia: Secondary | ICD-10-CM

## 2024-07-21 ENCOUNTER — Ambulatory Visit

## 2024-07-21 DIAGNOSIS — M542 Cervicalgia: Secondary | ICD-10-CM | POA: Diagnosis not present

## 2024-07-21 DIAGNOSIS — M47812 Spondylosis without myelopathy or radiculopathy, cervical region: Secondary | ICD-10-CM | POA: Diagnosis not present

## 2024-07-21 NOTE — Progress Notes (Signed)
°  [facility] [address] [place] [address] Phone: [phone] Subjective:   LILLETTE Crystal Madden, am serving as a scribe for Dr. Arthea Claudene.  I'm seeing this patient by the request  of:  Job Lukes, [place]  CC: back and neck pain follow up   [name]  Crystal Madden is a 48 y.o. female coming in with complaint of back and neck pain. OMT 05/23/2024. MRI on 07/22/2024. Patient states that her pain seems to be worsening.   Medications patient has been prescribed: Gabapentin   Taking: Intermittently     Recent MRI did show the patient does have severe spinal stenosis at C3-C5.  Significant nerve root impingement noted.  Progression since the 2023 MRI noted as well.    Reviewed prior external information including notes and imaging from previsou exam, outside providers and external EMR if available.   As well as notes that were available from care everywhere and other healthcare systems.  Past medical history, social, surgical and family history all reviewed in electronic medical record.  No pertanent information unless stated regarding to the chief complaint.   Past Medical History:  Diagnosis Date   Anxiety    Depression    High blood pressure    Hyperlipidemia    Lower back pain    Obesity (BMI 30-39.9)     Allergies[1]   Review of Systems:  No headache, visual changes, nausea, vomiting, diarrhea, constipation, dizziness, abdominal pain, skin rash, fevers, chills, night sweats, weight loss, swollen lymph nodes, body aches, joint swelling, chest pain, shortness of breath, mood changes. POSITIVE muscle aches  Objective  Blood pressure 122/62, pulse 92, height 5' 6 (1.676 m), weight 228 lb (103.4 kg), SpO2 98%.   General: No apparent distress alert and oriented x3 mood and affect normal, dressed appropriately.  HEENT: Pupils equal, extraocular movements intact  Respiratory: Patient's speak in full sentences and does not  appear short of breath  Cardiovascular: No lower extremity edema, non tender, no erythema  Gait MSK: Does have significant loss of lordosis noted.  Some limitation in extension of the neck noted.  No patient has 4 out of 5 strength but seems to be symmetric. Deep tendon reflexes of the upper extremity intact.  Patient though does have limited range of motion in all planes with the neck      Assessment and Plan:  Cervical disc disorder with radiculopathy Unfortunately worsening spinal stenosis.  Patient has shown significant progression that is consistent with patient's symptoms at this moment on MRI as well.  We discussed with patient about different treatment options.  Given Celebrex  for breakthrough pain and started on Cymbalta .  Referred to go to neurosurgery to discuss what surgical intervention would entail with patient having more pain on a regular basis that is affecting daily activities as well as sleep.  Follow-up with me again in 2 to 3 months otherwise with seeing how patient responds to the medication.        The above documentation has been reviewed and is accurate and complete Shadavia Dampier M Alahia Whicker, DO         Note: This dictation was prepared with Dragon dictation along with smaller phrase technology. Any transcriptional errors that result from this process are unintentional.            [1]  Allergies Allergen Reactions   Amlodipine  Swelling    Lower leg swelling when increased to 10 mg

## 2024-07-22 ENCOUNTER — Inpatient Hospital Stay: Admission: RE | Admit: 2024-07-22 | Discharge: 2024-07-22 | Attending: Family Medicine | Admitting: Family Medicine

## 2024-07-22 ENCOUNTER — Other Ambulatory Visit (HOSPITAL_COMMUNITY): Payer: Self-pay

## 2024-07-22 DIAGNOSIS — M5020 Other cervical disc displacement, unspecified cervical region: Secondary | ICD-10-CM | POA: Diagnosis not present

## 2024-07-22 DIAGNOSIS — M542 Cervicalgia: Secondary | ICD-10-CM

## 2024-07-22 DIAGNOSIS — M4802 Spinal stenosis, cervical region: Secondary | ICD-10-CM | POA: Diagnosis not present

## 2024-07-23 ENCOUNTER — Other Ambulatory Visit (HOSPITAL_COMMUNITY): Payer: Self-pay

## 2024-07-23 MED ORDER — VYVANSE 60 MG PO CHEW
1.0000 | CHEWABLE_TABLET | Freq: Every day | ORAL | 0 refills | Status: AC
  Filled 2024-07-23: qty 30, 30d supply, fill #0

## 2024-07-23 MED ORDER — VYVANSE 60 MG PO CHEW
60.0000 mg | CHEWABLE_TABLET | Freq: Every day | ORAL | 0 refills | Status: AC
  Filled 2024-08-23: qty 30, 30d supply, fill #0

## 2024-07-24 ENCOUNTER — Other Ambulatory Visit (HOSPITAL_COMMUNITY): Payer: Self-pay

## 2024-07-25 ENCOUNTER — Other Ambulatory Visit (HOSPITAL_COMMUNITY): Payer: Self-pay

## 2024-07-25 ENCOUNTER — Ambulatory Visit: Admitting: Family Medicine

## 2024-07-25 ENCOUNTER — Ambulatory Visit: Payer: Self-pay | Admitting: Family Medicine

## 2024-07-25 VITALS — BP 122/62 | HR 92 | Ht 66.0 in | Wt 228.0 lb

## 2024-07-25 DIAGNOSIS — M5010 Cervical disc disorder with radiculopathy, unspecified cervical region: Secondary | ICD-10-CM

## 2024-07-25 MED ORDER — KETOROLAC TROMETHAMINE 60 MG/2ML IM SOLN
60.0000 mg | Freq: Once | INTRAMUSCULAR | Status: AC
  Administered 2024-07-25: 09:00:00 60 mg via INTRAMUSCULAR

## 2024-07-25 MED ORDER — DULOXETINE HCL 20 MG PO CPEP
20.0000 mg | ORAL_CAPSULE | Freq: Every day | ORAL | 0 refills | Status: DC
  Filled 2024-07-25: qty 30, 30d supply, fill #0

## 2024-07-25 MED ORDER — METHYLPREDNISOLONE ACETATE 80 MG/ML IJ SUSP
80.0000 mg | Freq: Once | INTRAMUSCULAR | Status: AC
  Administered 2024-07-25: 09:00:00 80 mg via INTRAMUSCULAR

## 2024-07-25 MED ORDER — CELECOXIB 200 MG PO CAPS
200.0000 mg | ORAL_CAPSULE | Freq: Two times a day (BID) | ORAL | 0 refills | Status: DC
  Filled 2024-07-25: qty 60, 30d supply, fill #0

## 2024-07-25 NOTE — Assessment & Plan Note (Addendum)
 Unfortunately worsening spinal stenosis.  Patient has shown significant progression that is consistent with patient's symptoms at this moment on MRI as well.  We discussed with patient about different treatment options.  Given Celebrex  for breakthrough pain and started on Cymbalta .  Referred to go to neurosurgery to discuss what surgical intervention would entail with patient having more pain on a regular basis that is affecting daily activities as well as sleep.  Follow-up with me again in 2 to 3 months otherwise with seeing how patient responds to the medication.  Toradol  and Depo-Medrol  injection was given today as well.

## 2024-07-25 NOTE — Patient Instructions (Addendum)
 Cymbalta  20mg  Celebrex  200mg  BID prn Referral Dr. Katrina Full cocktail injection in backside today. No NSAIDs for 24 hrs.  Send update in MyChart in 1 month

## 2024-07-26 ENCOUNTER — Other Ambulatory Visit (HOSPITAL_COMMUNITY): Payer: Self-pay

## 2024-07-31 ENCOUNTER — Other Ambulatory Visit: Payer: Self-pay | Admitting: Physician Assistant

## 2024-08-01 ENCOUNTER — Other Ambulatory Visit (HOSPITAL_COMMUNITY): Payer: Self-pay

## 2024-08-01 ENCOUNTER — Other Ambulatory Visit: Payer: Self-pay

## 2024-08-01 MED ORDER — VALSARTAN-HYDROCHLOROTHIAZIDE 80-12.5 MG PO TABS
1.0000 | ORAL_TABLET | Freq: Every day | ORAL | 0 refills | Status: AC
  Filled 2024-08-01: qty 30, 30d supply, fill #0

## 2024-08-09 NOTE — Progress Notes (Addendum)
 "   Referring Physician:  Claudene Arthea HERO, DO [address] [place],  [place] [address]  Primary Physician:  Job Lukes, [place]  History of Present Illness: 08/16/2024 Crystal Madden is here today with a chief complaint of chronic and worsening neck pain. She has severe pain into her R trapezius worse with rotation to the R.  Neck pain began about two years ago with anterior arm numbness and mild weakness that improved after physical therapy, and she was asymptomatic for a time.  Over the past year neck pain has recurred and progressively worsened, with marked worsening over the last six months and severe symptoms in the last two months. Pain is constant, varies in intensity, localized from the shoulder blade to the lateral neck, worse with prolonged desk work, and significantly disrupts sleep.  She no longer has prominent radiating pain and has no current hand numbness or weakness, though she notes intermittent tingling. She denies loss of dexterity, fine motor difficulty, handwriting changes, gait instability, or falls.  She has completed physical therapy and chiropractic care and continues home exercises. She previously had lumbar spine surgery for lower back pathology.  Family history is notable for degenerative disc disease.   Discussed the use of AI scribe software for clinical note transcription with the patient, who gave verbal consent to proceed.Bowel/Bladder Dysfunction: none  Conservative measures:  Physical therapy: has participated in PT in the past and done some Home exercises with no relief Multimodal medical therapy including regular antiinflammatories: celebrex , cymbalta , gabapentin  Injections: no injections for her neck, she has had some in her back.   Past Surgery:  Lumbar Surgery in 2018  [facility] Carvin has some symptoms of cervical myelopathy.  The symptoms are causing a significant impact on the patient's life.   I have utilized the care  everywhere function in epic to review the outside records available from external health systems.   Review of Systems:  A 10 point review of systems is negative, except for the pertinent positives and negatives detailed in the HPI.  Past Medical History: Past Medical History:  Diagnosis Date   Anxiety    Depression    High blood pressure    Hyperlipidemia    Lower back pain    Obesity (BMI 30-39.9)     Past Surgical History: Past Surgical History:  Procedure Laterality Date   BLADDER SUSPENSION     CESAREAN SECTION     LUMBAR MICRODISCECTOMY  2018   SPINE SURGERY     TONSILLECTOMY  2000    Allergies: Allergies as of 08/16/2024 - Review Complete 05/23/2024  Allergen Reaction Noted   Amlodipine  Swelling 06/14/2019    Medications: Current Medications[1]  Social History: Social History[2]  Family Medical History: Family History  Problem Relation Age of Onset   Hypertension Mother    Hyperlipidemia Mother    Obesity Mother    COPD Father        2PPD smoker   Lung cancer Father    Heart disease Father    Hypertension Father    Stroke Father    Lung cancer Maternal Grandfather        smoker   ADD / ADHD Son    Colon cancer Neg Hx     Physical Examination: There were no vitals filed for this visit.  General: Patient is in no apparent distress. Attention to examination is appropriate.  Neck:   Supple.  Full range of motion.  Respiratory: Patient is breathing without any difficulty.  NEUROLOGICAL:     Awake, alert, oriented to person, place, and time.  Speech is clear and fluent.   Cranial Nerves: Pupils equal round and reactive to light.  Facial tone is symmetric.  Facial sensation is symmetric. Shoulder shrug is symmetric. Tongue protrusion is midline.  There is no pronator drift.  Strength: Side Biceps Triceps Deltoid Interossei Grip Wrist Ext. Wrist Flex.  R 5 5 4 5  4+ 5 5  L 5 5 5 5 5 5 5    Side Iliopsoas Quads Hamstring PF DF EHL  R 5 5 5 5  5 5   L 5 5 5 5 5 5    Reflexes are 3+ and symmetric at the biceps, triceps, brachioradialis, patella and achilles.   Hoffman's is present on the left.   suprapatellar reflex is present on the left.  Bilateral upper and lower extremity sensation is intact to light touch.    No evidence of dysmetria noted.  Gait is normal.  She has moderate difficulty with tandem walk.   Medical Decision Making  Imaging: MRI C spine 07/22/2024 IMPRESSION: 1. Severe spinal canal stenosis at C3-4 and C4-5. 2. Moderate right foraminal stenosis at C3-4 and moderate left foraminal stenosis at C4-5.   Electronically signed by: Franky Stanford MD 07/25/2024 02:30 AM EST RP Workstation: [id number]  Flexion-extension radiographs show disc height of 4 mm at C3-4 and C4-5 approximately.  She has no evidence of instability.  I have personally reviewed the xray imaging.  On dynamic imaging, there is no evidence of instability and no anterolisthesis.  There is no segmental angulation of more than 11 degrees.  This confirms that she meets criteria for cervical disc arthroplasty.  I have personally reviewed the images and agree with the above interpretation.  Assessment and Plan: Crystal Madden is a pleasant 48 y.o. female with cervical myelopathy and radiculopathy due to stenosis at C3-4 and C4-5.  She has particularly severe stenosis at C3-4 but also severe stenosis at C4-5.  There is no role for conservative management for cervical myelopathy with severe stenosis.  I recommended surgical intervention.  I recommended C3-5 cervical disc arthroplasty.  We reviewed the advantages and disadvantages in comparison to anterior cervical discectomy and fusion.  I feel that arthroplasty is most appropriate for her.  I discussed the planned procedure at length with the patient, including the risks, benefits, alternatives, and indications. The risks discussed include but are not limited to bleeding, infection, need for  reoperation, spinal fluid leak, stroke, vision loss, anesthetic complication, coma, paralysis, and even death. We also discussed the possibility of post-operative dysphagia, vocal cord paralysis, and the risk of adjacent segment disease in the future. I also described in detail that improvement was not guaranteed.  The patient expressed understanding of these risks, and asked that we proceed with surgery. I described the surgery in layman's terms, and gave ample opportunity for questions, which were answered to the best of my ability.   I spent a total of 30 minutes in this patient's care today. This time was spent reviewing pertinent records including imaging studies, obtaining and confirming history, performing a directed evaluation, formulating and discussing my recommendations, and documenting the visit within the medical record.        Thank you for involving me in the care of this patient.      Bekka Qian K. Clois MD, [facility] Neurosurgery     [1]  Current Outpatient Medications:    albuterol  (VENTOLIN  HFA) 108 (90 Base) MCG/ACT inhaler, Inhale 2  puffs into the lungs every 4 (four) hours as needed., Disp: 18 g, Rfl: 0   albuterol  (VENTOLIN  HFA) 108 (90 Base) MCG/ACT inhaler, Inhale 2 puffs into the lungs every 6 (six) hours as needed for wheezing or shortness of breath., Disp: 6.7 g, Rfl: 0   amoxicillin -clavulanate (AUGMENTIN ) 875-125 MG tablet, Take 1 tablet by mouth 2 (two) times daily., Disp: 20 tablet, Rfl: 0   azelastine  (ASTELIN ) 0.1 % nasal spray, Place 1 spray into both nostrils 2 (two) times daily. Use in each nostril as directed, Disp: 30 mL, Rfl: 2   bimatoprost  (LATISSE ) 0.03 % ophthalmic solution, PLACE 1 DROP ONTO APPLICATOR & APPLY EVENLY UPPER LID AT BASE OF LASHES TO BOTH EYES AT BEDTIME, Disp: 3 mL, Rfl: 2   celecoxib  (CELEBREX ) 200 MG capsule, Take 1 capsule (200 mg total) by mouth 2 (two) times daily., Disp: 60 capsule, Rfl: 0   cetirizine  (ZYRTEC  ALLERGY) 10 MG  tablet, Take 1 tablet (10 mg total) by mouth daily., Disp: 30 tablet, Rfl: 2   cyclobenzaprine  (FLEXERIL ) 10 MG tablet, Take 0.5-1 tablets (5-10 mg total) by mouth 3 (three) times daily as needed., Disp: 30 tablet, Rfl: 0   Drospirenone  (SLYND ) 4 MG TABS, Take 1 tablet (4 mg total) by mouth daily., Disp: 84 tablet, Rfl: 3   DULoxetine  (CYMBALTA ) 20 MG capsule, Take 1 capsule (20 mg total) by mouth daily., Disp: 30 capsule, Rfl: 0   gabapentin  (NEURONTIN ) 100 MG capsule, Take 2 capsules (200 mg total) by mouth at bedtime., Disp: 180 capsule, Rfl: 0   Lisdexamfetamine  Dimesylate (VYVANSE ) 60 MG CHEW, Chew 1 tablet (60 mg total) by mouth daily., Disp: 30 tablet, Rfl: 0   Lisdexamfetamine  Dimesylate 60 MG CHEW, Chew 1 tablet (60 mg total) by mouth daily., Disp: 30 tablet, Rfl: 0   Naltrexone -buPROPion  HCl ER 8-90 MG TB12, Start 1 tablet every morning for 7 days, then 1 tablet twice daily for 7 days, then 2 tablets every morning and 1 tablet in the evening for 7 days, then 2 tablets every morning and 2 tablets in the evening., Disp: 120 tablet, Rfl: 0   ondansetron  (ZOFRAN ) 4 MG tablet, Take 1 tablet  by mouth every 8  hours as needed for nausea or vomiting. Please schedule follow up appt with Lucie Buttner, PA for further refills [phone], Disp: 20 tablet, Rfl: 0   promethazine -dextromethorphan (PROMETHAZINE -DM) 6.25-15 MG/5ML syrup, Take 5 mLs by mouth 4 (four) times daily as needed., Disp: 100 mL, Rfl: 0   tiZANidine  (ZANAFLEX ) 4 MG tablet, Take 1 tablet (4 mg total) by mouth every 8 (eight) hours as needed for muscle spasms., Disp: 60 tablet, Rfl: 1   valsartan -hydrochlorothiazide  (DIOVAN -HCT) 80-12.5 MG tablet, Take 1 tablet by mouth daily., Disp: 30 tablet, Rfl: 0   VYVANSE  60 MG CHEW, Chew 1 tablet (60 mg total) by mouth daily., Disp: 30 tablet, Rfl: 0   VYVANSE  60 MG CHEW, Chew 1 tablet (60 mg total) by mouth daily., Disp: 30 tablet, Rfl: 0   VYVANSE  60 MG CHEW, Take 1 tablet by mouth daily,  Disp: 30 tablet, Rfl: 0   VYVANSE  60 MG CHEW, Chew 1 tablet (60 mg total) by mouth daily., Disp: 30 tablet, Rfl: 0   VYVANSE  60 MG CHEW, Take 1 tablet by mouth daily; may be filled 30 days after date prescribed, Disp: 30 tablet, Rfl: 0   VYVANSE  60 MG CHEW, Take 1 tablet by mouth daily, Disp: 30 tablet, Rfl: 0   VYVANSE  60 MG  CHEW, Take 1 tablet by mouth daily; may be filled 30 days after date prescribed, Disp: 30 tablet, Rfl: 0   VYVANSE  60 MG CHEW, Chew 1 tablet (60 mg total) by mouth daily., Disp: 30 tablet, Rfl: 0   VYVANSE  60 MG CHEW, Take 1 tablet by mouth daily, Disp: 30 tablet, Rfl: 0   VYVANSE  60 MG CHEW, Take 1 tablet by mouth daily; may be filled 30 days after date prescribed, Disp: 30 tablet, Rfl: 0 [2]  Social History Tobacco Use   Smoking status: Former    Current packs/day: 0.00    Types: Cigarettes    Quit date: 08/27/2004    Years since quitting: 19.9   Smokeless tobacco: Never  Vaping Use   Vaping status: Never Used  Substance Use Topics   Alcohol use: Yes    Comment: Ocassional.   Drug use: No   "

## 2024-08-16 ENCOUNTER — Encounter

## 2024-08-16 ENCOUNTER — Other Ambulatory Visit: Payer: Self-pay

## 2024-08-16 ENCOUNTER — Ambulatory Visit: Admitting: Neurosurgery

## 2024-08-16 VITALS — BP 130/78

## 2024-08-16 DIAGNOSIS — G959 Disease of spinal cord, unspecified: Secondary | ICD-10-CM

## 2024-08-16 DIAGNOSIS — M4802 Spinal stenosis, cervical region: Secondary | ICD-10-CM

## 2024-08-16 DIAGNOSIS — M5412 Radiculopathy, cervical region: Secondary | ICD-10-CM

## 2024-08-16 DIAGNOSIS — Z01818 Encounter for other preprocedural examination: Secondary | ICD-10-CM

## 2024-08-16 NOTE — Patient Instructions (Signed)
 Please see below for information in regards to your upcoming surgery:   Planned surgery: C3-5 arthroplasty   Surgery date: 10/12/24 at [facility]: [address] [place], [place] [address]) - you will find out your arrival time the business day before your surgery.   Pre-op appointment at [facility] Pre-admit Testing: you will receive a call with a date/time for this appointment. If you are scheduled for an in person appointment, Pre-admit Testing is located on the first floor of the [facility] building, [address], [address]. During this appointment, they will advise you which medications you can take the morning of surgery, and which medications you will need to hold for surgery. Labs (such as blood work, EKG) may be done at your pre-op appointment. You are not required to fast for these labs. Should you need to change your pre-op appointment, please call Pre-admit testing at [phone].   Please bring your medication bottles or an up to date medication list to your pre-admit testing appointment (regardless of whether we have a list in your chart).    Common restrictions after spine surgery: No bending, lifting, or twisting (BLT). Avoid lifting objects heavier than 10 pounds for the first 6 weeks after surgery. Where possible, avoid household activities that involve lifting, bending, reaching, pushing, or pulling such as laundry, vacuuming, grocery shopping, and childcare. Try to arrange for help from friends and family for these activities while you heal. Do not drive while taking prescription pain medication. Weeks 6 through 12 after surgery: avoid lifting more than 25 pounds.    X-rays after surgery: Because you are having an arthroplasty: for appointments after your 2 week follow-up: please arrive our office 30 minutes prior to your appointment for x-rays. This applies to every appointment after your 2 week follow-up. Failure to  do so may result in your appointment being rescheduled.    How to contact us :  If you have any questions/concerns before or after surgery, you can reach us  at [phone], or you can send a mychart message. We can be reached by phone or mychart 8am-4pm, Monday-Friday.  *Please note: Calls after 4pm are forwarded to a third party answering service. Mychart messages are not routinely monitored during evenings, weekends, and holidays. Please call our office to contact the answering service for urgent concerns during non-business hours.   If you have FMLA/disability paperwork, please drop it off or fax it to [phone]   Appointments/FMLA & disability paperwork: Reche Hait, & Nichole Registered Nurse/Surgery scheduler: Mylan Schwarz, RN & Katie, RN Certified Medical Assistants: Don, CMA, Elenor, CMA, Damien, CMA, & [place], [place] Physician Assistants: Lyle Decamp, PA-C, Edsel Goods, PA-C & Glade Boys, PA-C Surgeons: Penne Sharps, MD & Reeves Daisy, MD   [facility] PREADMIT TESTING VISIT and SURGERY INFORMATION SHEET   Now that surgery has been scheduled you can anticipate several phone calls from [facility] services. A pharmacy technician will call you to verify your current list of medications taken at home.               The Pre-Service Center will call to verify your insurance information and to give you billing estimates and information.             The Preadmit Testing Office will be calling to schedule a visit to obtain information for the anesthesia team and provide instructions on preparation for surgery.  What can you expect for the Preadmit Testing Visit: Appointments may  be scheduled in-person or by telephone.  If a telephone visit is scheduled, you may be asked to come into the office to have lab tests or other studies performed.   This visit will not be completed any greater than 14 days prior to your surgery.  If your surgery has been  scheduled for a future date, please do not be alarmed if we have not contacted you to schedule an appointment more than a month prior to the surgery date.    Please be prepared to provide the following information during this appointment:            -Personal medical history                                               -Medication and allergy list            -Any history of problems with anesthesia              -Recent lab work or diagnostic studies            -Please notify us  of any needs we should be aware of to provide the best care possible           -You will be provided with instructions on how to prepare for your surgery.    On The Day of Surgery:  You must have a driver to take you home after surgery, you will be asked not to drive for 24 hours following surgery.  Taxi, Gisele and non-medical transport will not be acceptable means of transportation unless you have a responsible individual who will be traveling with you.  Visitors in the surgical area:   2 people will be able to visit you in your room once your preparation for surgery has been completed. During surgery, your visitors will be asked to wait in the Surgery Waiting Area.  It is not a requirement for them to stay, if they prefer to leave and come back.  Your visitor(s) will be given an update once the surgery has been completed.  No visitors are allowed in the initial recovery room to respect patient privacy and safety.  Once you are more awake and transfer to the secondary recovery area, or are transferred to an inpatient room, visitors will again be able to see you.  To respect and protect your privacy: We will ask on the day of surgery who your driver will be and what the contact number for that individual will be. We will ask if it is okay to share information with this individual, or if there is an alternative individual that we, or the surgeon, should contact to provide updates and information. If family or friends  come to the surgical information desk requesting information about you, who you have not listed with us , no information will be given.   It may be helpful to designate someone as the main contact who will be responsible for updating your other friends and family.    PREADMIT TESTING OFFICE: [phone] SAME DAY SURGERY: [phone] We look forward to caring for you before and throughout the process of your surgery.

## 2024-08-19 LAB — GENECONNECT MOLECULAR SCREEN: Genetic Analysis Overall Interpretation: NEGATIVE

## 2024-08-23 ENCOUNTER — Other Ambulatory Visit (HOSPITAL_COMMUNITY): Payer: Self-pay

## 2024-10-12 ENCOUNTER — Ambulatory Visit: Admit: 2024-10-12 | Admitting: Neurosurgery

## 2024-10-12 SURGERY — CERVICAL ANTERIOR DISC ARTHROPLASTY, 2 LEVEL
Anesthesia: General

## 2024-10-27 ENCOUNTER — Encounter: Admitting: Neurosurgery

## 2024-11-24 ENCOUNTER — Other Ambulatory Visit

## 2024-11-24 ENCOUNTER — Encounter

## 2024-12-29 ENCOUNTER — Other Ambulatory Visit

## 2024-12-29 ENCOUNTER — Encounter: Admitting: Neurosurgery
